# Patient Record
Sex: Female | Born: 1965
Health system: Southern US, Community
[De-identification: ages and names within clinical notes are randomized; demographics above are authoritative.]

## PROBLEM LIST (undated history)

## (undated) DIAGNOSIS — M1990 Unspecified osteoarthritis, unspecified site: Secondary | ICD-10-CM

## (undated) DIAGNOSIS — F323 Major depressive disorder, single episode, severe with psychotic features: Secondary | ICD-10-CM

## (undated) DIAGNOSIS — T7840XA Allergy, unspecified, initial encounter: Secondary | ICD-10-CM

## (undated) DIAGNOSIS — M810 Age-related osteoporosis without current pathological fracture: Secondary | ICD-10-CM

## (undated) DIAGNOSIS — J449 Chronic obstructive pulmonary disease, unspecified: Secondary | ICD-10-CM

## (undated) DIAGNOSIS — Z860100 Personal history of colon polyps, unspecified: Secondary | ICD-10-CM

## (undated) DIAGNOSIS — N200 Calculus of kidney: Secondary | ICD-10-CM

## (undated) DIAGNOSIS — M779 Enthesopathy, unspecified: Secondary | ICD-10-CM

## (undated) DIAGNOSIS — Z9889 Other specified postprocedural states: Secondary | ICD-10-CM

## (undated) DIAGNOSIS — T8859XA Other complications of anesthesia, initial encounter: Secondary | ICD-10-CM

## (undated) DIAGNOSIS — E785 Hyperlipidemia, unspecified: Secondary | ICD-10-CM

## (undated) DIAGNOSIS — I209 Angina pectoris, unspecified: Secondary | ICD-10-CM

## (undated) DIAGNOSIS — K859 Acute pancreatitis without necrosis or infection, unspecified: Secondary | ICD-10-CM

## (undated) DIAGNOSIS — K219 Gastro-esophageal reflux disease without esophagitis: Secondary | ICD-10-CM

## (undated) DIAGNOSIS — J309 Allergic rhinitis, unspecified: Secondary | ICD-10-CM

## (undated) DIAGNOSIS — B029 Zoster without complications: Secondary | ICD-10-CM

## (undated) DIAGNOSIS — J189 Pneumonia, unspecified organism: Secondary | ICD-10-CM

## (undated) DIAGNOSIS — I10 Essential (primary) hypertension: Secondary | ICD-10-CM

## (undated) DIAGNOSIS — R112 Nausea with vomiting, unspecified: Secondary | ICD-10-CM

## (undated) DIAGNOSIS — Z5189 Encounter for other specified aftercare: Secondary | ICD-10-CM

## (undated) DIAGNOSIS — Z87442 Personal history of urinary calculi: Secondary | ICD-10-CM

## (undated) DIAGNOSIS — E119 Type 2 diabetes mellitus without complications: Secondary | ICD-10-CM

## (undated) HISTORY — DX: Calculus of kidney: N20.0

## (undated) HISTORY — DX: Chronic obstructive pulmonary disease, unspecified: J44.9

## (undated) HISTORY — DX: Age-related osteoporosis without current pathological fracture: M81.0

## (undated) HISTORY — PX: FRACTURE SURGERY: SHX138

## (undated) HISTORY — PX: GALLBLADDER SURGERY: SHX652

## (undated) HISTORY — DX: Pneumonia, unspecified organism: J18.9

## (undated) HISTORY — DX: Hyperlipidemia, unspecified: E78.5

## (undated) HISTORY — DX: Type 2 diabetes mellitus without complications: E11.9

## (undated) HISTORY — DX: Unspecified osteoarthritis, unspecified site: M19.90

## (undated) HISTORY — DX: Personal history of colonic polyps: Z86.010

## (undated) HISTORY — DX: Gastro-esophageal reflux disease without esophagitis: K21.9

## (undated) HISTORY — DX: Allergic rhinitis, unspecified: J30.9

## (undated) HISTORY — DX: Personal history of colon polyps, unspecified: Z86.0100

## (undated) HISTORY — DX: Allergy, unspecified, initial encounter: T78.40XA

## (undated) HISTORY — PX: CHOLECYSTECTOMY: SHX55

## (undated) HISTORY — PX: ABDOMINAL HYSTERECTOMY: SHX81

## (undated) HISTORY — DX: Essential (primary) hypertension: I10

## (undated) HISTORY — DX: Encounter for other specified aftercare: Z51.89

## (undated) HISTORY — DX: Enthesopathy, unspecified: M77.9

## (undated) HISTORY — PX: TUBAL LIGATION: SHX77

## (undated) HISTORY — DX: Major depressive disorder, single episode, severe with psychotic features: F32.3

## (undated) HISTORY — DX: Zoster without complications: B02.9

## (undated) HISTORY — DX: Acute pancreatitis without necrosis or infection, unspecified: K85.90

## (undated) HISTORY — PX: JOINT REPLACEMENT: SHX530

---

## 1997-11-25 ENCOUNTER — Inpatient Hospital Stay (HOSPITAL_COMMUNITY): Admission: AD | Admit: 1997-11-25 | Discharge: 1997-11-28 | Payer: Self-pay | Admitting: Obstetrics and Gynecology

## 1997-12-01 ENCOUNTER — Inpatient Hospital Stay (HOSPITAL_COMMUNITY): Admission: AD | Admit: 1997-12-01 | Discharge: 1997-12-01 | Payer: Self-pay | Admitting: *Deleted

## 1997-12-04 ENCOUNTER — Ambulatory Visit (HOSPITAL_COMMUNITY): Admission: RE | Admit: 1997-12-04 | Discharge: 1997-12-04 | Payer: Self-pay | Admitting: Obstetrics and Gynecology

## 1997-12-05 ENCOUNTER — Observation Stay (HOSPITAL_COMMUNITY): Admission: AD | Admit: 1997-12-05 | Discharge: 1997-12-06 | Payer: Self-pay | Admitting: Obstetrics and Gynecology

## 1998-06-03 ENCOUNTER — Other Ambulatory Visit: Admission: RE | Admit: 1998-06-03 | Discharge: 1998-06-03 | Payer: Self-pay | Admitting: Gynecology

## 1999-06-19 ENCOUNTER — Other Ambulatory Visit: Admission: RE | Admit: 1999-06-19 | Discharge: 1999-06-19 | Payer: Self-pay | Admitting: Obstetrics and Gynecology

## 2000-07-20 ENCOUNTER — Other Ambulatory Visit: Admission: RE | Admit: 2000-07-20 | Discharge: 2000-07-20 | Payer: Self-pay | Admitting: Obstetrics and Gynecology

## 2000-10-13 ENCOUNTER — Other Ambulatory Visit: Admission: RE | Admit: 2000-10-13 | Discharge: 2000-10-13 | Payer: Self-pay | Admitting: Obstetrics and Gynecology

## 2001-03-10 ENCOUNTER — Other Ambulatory Visit: Admission: RE | Admit: 2001-03-10 | Discharge: 2001-03-10 | Payer: Self-pay | Admitting: Obstetrics and Gynecology

## 2001-09-22 ENCOUNTER — Other Ambulatory Visit: Admission: RE | Admit: 2001-09-22 | Discharge: 2001-09-22 | Payer: Self-pay | Admitting: Obstetrics and Gynecology

## 2002-03-29 ENCOUNTER — Encounter (INDEPENDENT_AMBULATORY_CARE_PROVIDER_SITE_OTHER): Payer: Self-pay

## 2002-03-29 ENCOUNTER — Ambulatory Visit (HOSPITAL_COMMUNITY): Admission: RE | Admit: 2002-03-29 | Discharge: 2002-03-29 | Payer: Self-pay | Admitting: Obstetrics and Gynecology

## 2002-05-24 ENCOUNTER — Encounter (INDEPENDENT_AMBULATORY_CARE_PROVIDER_SITE_OTHER): Payer: Self-pay

## 2002-05-24 ENCOUNTER — Inpatient Hospital Stay (HOSPITAL_COMMUNITY): Admission: RE | Admit: 2002-05-24 | Discharge: 2002-05-26 | Payer: Self-pay | Admitting: Obstetrics and Gynecology

## 2002-06-14 ENCOUNTER — Inpatient Hospital Stay (HOSPITAL_COMMUNITY): Admission: EM | Admit: 2002-06-14 | Discharge: 2002-06-16 | Payer: Self-pay | Admitting: Psychiatry

## 2005-10-01 ENCOUNTER — Other Ambulatory Visit: Admission: RE | Admit: 2005-10-01 | Discharge: 2005-10-01 | Payer: Self-pay | Admitting: Obstetrics and Gynecology

## 2006-05-21 ENCOUNTER — Ambulatory Visit (HOSPITAL_COMMUNITY): Admission: RE | Admit: 2006-05-21 | Discharge: 2006-05-21 | Payer: Self-pay | Admitting: Specialist

## 2006-05-21 ENCOUNTER — Encounter (INDEPENDENT_AMBULATORY_CARE_PROVIDER_SITE_OTHER): Payer: Self-pay | Admitting: *Deleted

## 2006-09-16 ENCOUNTER — Ambulatory Visit (HOSPITAL_BASED_OUTPATIENT_CLINIC_OR_DEPARTMENT_OTHER): Admission: RE | Admit: 2006-09-16 | Discharge: 2006-09-16 | Payer: Self-pay | Admitting: Urology

## 2006-09-20 HISTORY — PX: CARDIAC CATHETERIZATION: SHX172

## 2006-10-07 ENCOUNTER — Ambulatory Visit (HOSPITAL_BASED_OUTPATIENT_CLINIC_OR_DEPARTMENT_OTHER): Admission: RE | Admit: 2006-10-07 | Discharge: 2006-10-07 | Payer: Self-pay | Admitting: Urology

## 2007-04-04 ENCOUNTER — Ambulatory Visit (HOSPITAL_BASED_OUTPATIENT_CLINIC_OR_DEPARTMENT_OTHER): Admission: RE | Admit: 2007-04-04 | Discharge: 2007-04-04 | Payer: Self-pay | Admitting: Urology

## 2007-05-04 ENCOUNTER — Ambulatory Visit: Payer: Self-pay | Admitting: Endocrinology

## 2007-07-13 ENCOUNTER — Ambulatory Visit (HOSPITAL_BASED_OUTPATIENT_CLINIC_OR_DEPARTMENT_OTHER): Admission: RE | Admit: 2007-07-13 | Discharge: 2007-07-13 | Payer: Self-pay | Admitting: Urology

## 2008-09-10 ENCOUNTER — Ambulatory Visit (HOSPITAL_BASED_OUTPATIENT_CLINIC_OR_DEPARTMENT_OTHER): Admission: RE | Admit: 2008-09-10 | Discharge: 2008-09-10 | Payer: Self-pay | Admitting: Urology

## 2009-08-01 ENCOUNTER — Ambulatory Visit (HOSPITAL_COMMUNITY): Admission: RE | Admit: 2009-08-01 | Discharge: 2009-08-02 | Payer: Self-pay | Admitting: Obstetrics and Gynecology

## 2009-08-01 ENCOUNTER — Encounter (INDEPENDENT_AMBULATORY_CARE_PROVIDER_SITE_OTHER): Payer: Self-pay | Admitting: Obstetrics and Gynecology

## 2009-12-17 ENCOUNTER — Ambulatory Visit (HOSPITAL_COMMUNITY): Admission: RE | Admit: 2009-12-17 | Discharge: 2009-12-17 | Payer: Self-pay | Admitting: Rheumatology

## 2010-03-06 ENCOUNTER — Encounter: Admission: RE | Admit: 2010-03-06 | Discharge: 2010-03-06 | Payer: Self-pay | Admitting: Obstetrics and Gynecology

## 2010-10-11 ENCOUNTER — Encounter: Payer: Self-pay | Admitting: Rheumatology

## 2010-12-23 LAB — URINALYSIS, ROUTINE W REFLEX MICROSCOPIC
Nitrite: NEGATIVE
Specific Gravity, Urine: <1.005 — ABNORMAL LOW (ref 1.005–1.030)
Urobilinogen, UA: 0.2 mg/dL (ref 0.0–1.0)
pH: 5.5 (ref 5.0–8.0)

## 2010-12-23 LAB — CBC
HCT: 43.3 % (ref 36.0–46.0)
MCHC: 33.5 g/dL (ref 30.0–36.0)
MCV: 92.2 fL (ref 78.0–100.0)
Platelets: 236 10*3/uL (ref 150–400)
Platelets: 253 10*3/uL (ref 150–400)
RDW: 12 % (ref 11.5–15.5)
RDW: 12.2 % (ref 11.5–15.5)
WBC: 9.8 10*3/uL (ref 4.0–10.5)

## 2010-12-23 LAB — BASIC METABOLIC PANEL
GFR calc Af Amer: >60 mL/min (ref >60)
GFR calc non Af Amer: >60 mL/min (ref >60)
Potassium: 3.3 mEq/L — ABNORMAL LOW (ref 3.5–5.1)
Sodium: 136 mEq/L (ref 135–145)

## 2010-12-23 LAB — HEMOGLOBIN AND HEMATOCRIT, BLOOD: HCT: 40.3 % (ref 36.0–46.0)

## 2011-01-29 ENCOUNTER — Other Ambulatory Visit: Payer: Self-pay | Admitting: Obstetrics and Gynecology

## 2011-02-02 NOTE — Op Note (Signed)
[name], ORBACH               ACCOUNT NO.:  [id number]   MEDICAL RECORD NO.:  [id number]          PATIENT TYPE:  AMB   LOCATION:  NESC                         FACILITY:  [facility]   PHYSICIAN:  Martina Sinner, MD DATE OF BIRTH:  March 28, 1966   DATE OF PROCEDURE:  07/13/2007  DATE OF DISCHARGE:                               OPERATIVE REPORT   PREOPERATIVE DIAGNOSIS:  Stress incontinence; incontinence without  feeling.   POSTOPERATIVE DIAGNOSIS:  Stress incontinence; incontinence without  sealing.   SURGERY:  Cystoscopy, double balloon retrograde urethrogram and collagen  injection therapy.   Ms. Jeancharles has had two slings.  She is leaking without warning.  Clinically, she did not have urethral diverticulum, but I thought it was  very reasonable under the circumstances to do a balloon urethrogram.   The patient was prepped and draped in usual fashion.  She is given  preoperative antibiotics.   I used a 16-French catheter.  I used two double stranded zero silk to  tie off the end proximal to the opening in the catheter.  I then cut an  opening in the catheter proximal to the balloon.  I gently inserted the  well lubricated catheter and did a retrograde while I pinched off her  urethral meatus.  A little bit of dye reached the bladder.  I could see  the short urethra.  In my opinion, under fluoroscopic guidance, there  was no urethral diverticulum.   RETROGRADE URETHROGRAM:  I performed a retrograde urethrogram with the  technique above.  She was in the AP position.  Under fluoroscopic  guidance I did the x-ray twice.  There was a good visualization of the  urethra.  There is no diverticulum.  Dye reached the bladder.   The Foley catheter was removed.   COLLAGEN INJECTION THERAPY:  I then performed a transurethral injection  of collagen.  I initially injected at the 5 and 7 o'clock.  I was a  little bit disappointed with the degree of coaptation and this may be  due to the  fact that she had two slings.  I then injected at two o'clock  and then more distally at 7 o'clock with excellent coaptation.  Overall,  her procedure efficacy could be effected by the multiple sticks with the  needle, but hopefully not.   Bladder was emptied.  The patient was taken to the recovery room.  The  patient will be followed as per protocol.           ______________________________  Martina Sinner, MD  Electronically Signed     SAM/MEDQ  D:  07/13/2007  T:  07/14/2007  Job:  [id number]

## 2011-02-02 NOTE — Op Note (Signed)
[name], MAHAJAN               ACCOUNT NO.:  [id number]   MEDICAL RECORD NO.:  [id number]          PATIENT TYPE:  AMB   LOCATION:  N[facility]C                         FACILITY:  [facility]   PHYSICIAN:  Martina Sinner, MD DATE OF BIRTH:  12-Mar-1966   DATE OF PROCEDURE:  04/04/2007  DATE OF DISCHARGE:                               OPERATIVE REPORT   PREOPERATIVE DIAGNOSIS:  Stress incontinence.   POSTOPERATIVE DIAGNOSIS:  Stress incontinence.   SURGERY:  Cystoscopy and transurethral collagen injection therapy.   Ms. Lavergne has small volume leakage without warning when she is active.  I have been trying to sort out whether or not it is due to instability  that she does not perceive or to a mild outlet abnormality.  She has had  two sling procedures.   The patient is prepped and draped in the usual fashion.  The ACMI scope  was used for the examination and treatment.  Bladder mucosa and trigone  were normal.  There was no stitch, foreign, body or carcinoma.  I  injected 2.2 and 2/3 syringes of collagen at 5, and 7 and 6 o'clock with  excellent coaptation.  The first injection was a little bit deep but  overall I was very pleased with the procedure and there was no  extravasation.  I hope this will greatly improve her leakage without  warning.  Bladder was partially emptied and the patient taken to  recovery room.           ______________________________  Martina Sinner, MD  Electronically Signed     SAM/MEDQ  D:  04/04/2007  T:  04/05/2007  Job:  [id number]

## 2011-02-02 NOTE — Consult Note (Signed)
[name]                          ENDOCRINOLOGY CONSULTATION   NAME:Rich, Dana LEISING                      MRN:          [id number]  DATE:05/04/2007                            DOB:          01/06/1966    REFERRING PHYSICIAN:  Dr. Lorin Picket in [place].   REASON FOR VISIT:  Thyroid eye disease.   HISTORY OF PRESENT ILLNESS:  A 45 year old woman who states 4 years of  left eye bulging.  Her eye doctor told her that this was due to her  thyroid.  She is feeling well except for some weight gain.   PAST MEDICAL HISTORY:  Allergic rhinitis.   SOCIAL HISTORY:  She is married and works in Materials engineer.   FAMILY HISTORY:  She has several second degree relatives who have  hypothyroidism.   REVIEW OF SYSTEMS:  She denies double vision.   PHYSICAL EXAMINATION:  VITAL SIGNS:  Blood pressure 117/72, heart rate  86, temperature 97.4, weight 183.  GENERAL:  No distress.  SKIN:  Not diaphoretic.  HEENT:  She has minimal left proptosis.  Thyroid is normal.   LABORATORY DATA:  On January 03, 2007, TSH 1.17.  On September 03, 2004,  TSH 1.31.   IMPRESSION:  1. Minimal thyroid eye disease.  2. Weight gain of nonthyroidal etiology.   PLAN:  1. I offered to check an MRI of the eye to further define the eye      disease, but I told her it really does not change therapy, and she      has declined this.  2. Continue efforts toward weight loss.  3. I have told her that she should see Dr. Lorin Picket annually and she      should have an annual TSH and physical examination of the thyroid      and I am happy to see her back as needed.    Sean A. Everardo All, MD  Electronically Signed   SAE/MedQ  DD: 05/07/2007  DT: 05/08/2007  Job #: [id number]   cc:   Dr. Lorin Picket, Rosalita Levan

## 2011-02-02 NOTE — Op Note (Signed)
[name], [name]               ACCOUNT NO.:  [id number]   MEDICAL RECORD NO.:  [id number]          PATIENT TYPE:  AMB   LOCATION:  N[facility]                         FACILITY:  [facility]   PHYSICIAN:  Martina Sinner, MD DATE OF BIRTH:  16-Apr-1966   DATE OF PROCEDURE:  09/10/2008  DATE OF DISCHARGE:  09/10/2008                               OPERATIVE REPORT   PREOPERATIVE DIAGNOSIS:  Stress urinary incontinence.   POSTOPERATIVE DIAGNOSIS:  Stress urinary incontinence.   SURGERY:  Cystoscopy, transurethral collagen injection therapy.   [name] has stress incontinence following two slings.  She has had  some response  to collagen in the past.   The patient was prepped and draped in the usual fashion.  The ACMI scope  was utilized.  The bladder mucosa and trigone were normal.  There was no  stitch, foreign body or carcinoma.  I injected 3 syringes of collagen,  one at 5 and two at 7 o'clock.  There was excellent coaptation.  I was  very pleased with the procedure.  The bladder was emptied with a small  red rubber catheter and the patient was taken to the recovery room.           ______________________________  Martina Sinner, MD  Electronically Signed     SAM/MEDQ  D:  09/19/2008  T:  09/19/2008  Job:  [id number]

## 2011-02-05 NOTE — Discharge Summary (Signed)
NAME:  [name], [name]                         ACCOUNT NO.:  [id number]   MEDICAL RECORD NO.:  [id number]                   PATIENT TYPE:  IPS   LOCATION:  0400                                 FACILITY:  [facility]   PHYSICIAN:  Jeanice Lim, M.D.              DATE OF BIRTH:  [birth year]   DATE OF ADMISSION:  06/14/2002  DATE OF DISCHARGE:  06/16/2002                                 DISCHARGE SUMMARY   IDENTIFYING DATA:  This is a 45 year old Caucasian female, married,  voluntarily admitted reporting being harassed by spirits and felt that she  was not herself, appearing delusional.   MEDICATIONS:  Prozac.   ALLERGIES:  No known drug allergies.   PHYSICAL EXAMINATION:  Essentially within normal limits.  Neurologically  nonfocal.  Too restless and disorganized in the ER to have a complete  physical examination.  CT was negative.  The patient was medically cleared.   LABORATORY DATA:  CBC and CMET were within normal limits.   MENTAL STATUS EXAM:  Medium-built, healthy, fully alert, calm female who had  had 2 mg of Ativan and was more calm.  Speech was somewhat disorganized,  hyperreligious.  Mood somewhat labile.  Thought processes goal directed.  Thought content preoccupied with God and spirits, delusional and  hyperreligious with flight of ideas.  Cognitively intact.  Judgment and  insight poor.   ADMISSION DIAGNOSES:   AXIS I:  Bipolar disorder, manic-phase.   AXIS II:  None.   AXIS III:  1. Status post hysterectomy.  2. Seasonal rhinitis.   AXIS IV:  Moderate (problems with primary support group).   AXIS V:  22/78.   HOSPITAL COURSE:  The patient was admitted and ordered routine p.r.n.  medications and underwent further monitoring and was encouraged to  participate in individual, group and milieu therapy.  The patient was  resumed on psychotropics and given Zyprexa Zydis and Ativan p.r.n.  agitation.  Zyrtec was ordered as well as Risperdal and Cogentin for  dystonia.  The patient was then placed on Prozac, Risperdal and family was  contacted.  Aftercare was discussed.  The patient reported a positive  response to clinical intervention.   CONDITION ON DISCHARGE:  Improved.  Mood was more euthymic.  Affect  brighter.  Thought processes goal directed.  Thought content negative for  dangerous ideation or psychotic symptoms.  The patient reported motivation  to be compliant with the follow-up plan.   DISCHARGE MEDICATIONS:  1. Prozac 10 mg q.a.m.  2. Risperdal 0.5 mg q.h.s.   FOLLOW UP:  [facility] on Monday,  June 18, 2002 for intensive close follow-up.   DISCHARGE DIAGNOSES:   AXIS I:  Bipolar disorder, manic-phase.   AXIS II:  None.   AXIS III:  1. Status post hysterectomy.  2. Seasonal rhinitis.   AXIS IV:  Moderate (problems with primary support group).   AXIS V:  Global Assessment of Functioning on discharge 50.                                                 Jeanice Lim, M.D.    JEM/MEDQ  D:  07/15/2002  T:  07/15/2002  Job:  [id number]

## 2011-02-05 NOTE — Op Note (Signed)
[name], [name]               ACCOUNT NO.:  [id number]   MEDICAL RECORD NO.:  [id number]          PATIENT TYPE:  AMB   LOCATION:  [facility]C                         FACILITY:  [facility]   PHYSICIAN:  Mark C. Vernie Ammons, M.D.  DATE OF BIRTH:  07-Dec-1965   DATE OF PROCEDURE:  09/16/2006  DATE OF DISCHARGE:                               OPERATIVE REPORT   PREOPERATIVE DIAGNOSIS:  Stress urinary incontinence.   POSTOPERATIVE DIAGNOSIS:  Stress urinary incontinence.   PROCEDURE:  Transobturator sling.   SURGEON:  Dr. Vernie Ammons   ANESTHESIA:  General.   BLOOD LOSS:  Minimal.   DRAINS:  None.   SPECIMENS:  None.   COMPLICATIONS:  None.   INDICATIONS:  The patient is a 45 year old white female, who was seen  initially for a chief complaint of leakage of urine for 9 years after  having her second child.  It requires she wears a pad and also was found  to have some mild bladder instability.  We discussed surgical correction  of the stress component with the associated risks, complications, and  alternatives.  She understands and elects to proceed with surgical  correction at this time.   DESCRIPTION OF OPERATION:  After informed consent, the patient brought  to the major OR, placed on table, administered general anesthesia, then  moved to the dorsal lithotomy position.  Her genitalia was sterilely  prepped and draped as well as the vagina.  A weighted speculum was  placed in the vagina and a Foley catheter placed in the bladder, and the  bladder was fully drained.   Half-percent Marcaine with 1:200,000 epinephrine was injected  suburethrally just beneath the vaginal mucosa.  I then allowed adequate  time for epinephrine effect and made an incision in the midline over the  mid urethra by palpation of the bladder neck with mild traction on the  Foley catheter.  Sharp dissection was then undertaken on either side of  the urethra, just beneath the vaginal mucosa.  This exposed the mid  urethral region.  I was then able to insert an index finger through the  incision and palpate the undersurface of the symphysis pubis on each  side.   Stab incisions were then made 5 cm lateral to the midline at the level  of the clitoris at the location of the obturator fossa.  The bladder was  fully drained through the Foley catheter, and the transobturator trocar  was then passed through the skin incision, through the obturator fossa  and back behind the symphysis pubis and then directed out on my index  finger at the mid urethral level with care being taken not to perforate  the vaginal mucosa throughout the length of the trocar's course.  The  sling material was then affixed to the tip of the trocar and brought  back through and out through the skin incision.  This was performed  first on the left side and then right side in an identical fashion.   The Foley catheter was then removed, and the 21-French cystoscope with  70-degree lens was inserted in the bladder.  The bladder was fully  inspect and noted to be free of any tumor, stones, or inflammatory  lesions.  There was no evidence of perforation of the bladder, foreign  body, bleeding, or other abnormality.  The cystoscope was then removed  and Foley catheter replaced.  I then adjusted the sling tension by  placing forceps beneath the urethra, removing the plastic coating from  the sling first on left side and then adjusting tension on the right.  I  noted the sling laid in good position at the mid urethral level with  minimal tension.  The incision was then irrigated copiously with  antibiotic solution, and the vaginal mucosa was closed with running 2-0  Vicryl suture.  The skin incisions were then closed with Dermabond and  the Foley catheter removed, and the patient was awakened and taken to  recovery room in stable satisfactory condition.  She tolerated the  procedure well with no intraoperative complications.  Needle,  sponge,  and instrument counts were correct at the end of the operation.   She will be given a prescription for Vicoprofen 1-2 q.4 h p.r.n. #28 and  Cipro 500 mg b.i.d. #10.  She will follow-up my office in 1 week for  recheck and was given written instructions upon discharge.      Mark C. Vernie Ammons, M.D.  Electronically Signed     MCO/MEDQ  D:  09/16/2006  T:  09/16/2006  Job:  [id number]

## 2011-02-05 NOTE — Discharge Summary (Signed)
[name], [name]                         ACCOUNT NO.:  [id number]   MEDICAL RECORD NO.:  [id number]                   PATIENT TYPE:  INP   LOCATION:  9321                                 FACILITY:  [facility]   PHYSICIAN:  Miguel Aschoff, M.D.                    DATE OF BIRTH:  1966/01/03   DATE OF ADMISSION:  05/24/2002  DATE OF DISCHARGE:  05/26/2002                                 DISCHARGE SUMMARY   ADMISSION DIAGNOSES:  1. Menorrhagia.  2. Dysmenorrhea.  3. Cystocele.  4. Rectocele.   FINAL DIAGNOSES:  1. Menorrhagia.  2. Dysmenorrhea.  3. Cystocele.  4. Rectocele.   OPERATIONS AND PROCEDURES:  1. Total vaginal hysterectomy.  2. Anterior and posterior colporrhaphy.   ANESTHESIA:  General.   BRIEF HISTORY:  The patient is a 45 year old white female with history of  progressive menorrhagia and dysmenorrhea status post prior D&C and  laparoscopy.  The patient's heavy vaginal bleeding has continued as well as  her dysmenorrhea.  In addition, on clinical examination she was noted to  have first degree rectocele and second degree cystocele.  In view of the  menorrhagia, dysmenorrhea, cystocele, and rectocele she was admitted to the  hospital to undergo definitive therapy via total vaginal hysterectomy and  anterior and posterior colporrhaphy.   HOSPITAL COURSE:  Preoperative laboratories were obtained.  Her admission  hemoglobin was 14.4, white count 5700.  Chemistry profile was within normal  limits.  Urinalysis was negative.  Under general anesthesia on May 24, 2002 a total vaginal hysterectomy and anterior and posterior colporrhaphy  were carried out without difficulty.  The patient tolerated the procedure  well.  She had essentially uncomplicated postoperative course, tolerating  diet, ambulation, and any medications without problem.  By September 6 she  was in satisfactory condition and felt stable enough to be sent home.  She  did spike a temperature of 100.9 on  the night prior to discharge.  She was  sent home on September 6 in satisfactory condition.   DISCHARGE MEDICATIONS:  1. Cipro 250 mg p.o. b.i.d. for five days.  2. Tylox one or two q.3h. as needed for pain.   DISCHARGE INSTRUCTIONS:  She was instructed to do no heavy lifting, place  nothing in the vagina, to call if there are any problems such as fever,  pain, or heavy bleeding.  The patient is to be seen back in the office in  four  weeks for follow-up examination.  The pathology report on the hysterectomy  specimen revealed 111 g uterus with benign secretory endometrium.  Endometrium and serosa were unremarkable.  The patient was sent home in  satisfactory condition.  Miguel Aschoff, M.D.    AR/MEDQ  D:  07/06/2002  T:  07/06/2002  Job:  [id number]

## 2011-02-05 NOTE — Op Note (Signed)
[name], [name]                         ACCOUNT NO.:  [id number]   MEDICAL RECORD NO.:  [id number]                   PATIENT TYPE:  INP   LOCATION:  9321                                 FACILITY:  [facility]   PHYSICIAN:  Miguel Aschoff, M.D.                    DATE OF BIRTH:  14-Jul-1966   DATE OF PROCEDURE:  05/22/2002  DATE OF DISCHARGE:                                 OPERATIVE REPORT   PREOPERATIVE DIAGNOSES:  Menorrhagia, dysmenorrhea, pelvic pain, hypermobile  urethra with small cystocele and rectocele.   POSTOPERATIVE DIAGNOSES:  Menorrhagia, dysmenorrhea, pelvic pain,  hypermobile urethra with small cystocele and rectocele.   PROCEDURE:  Total vaginal hysterectomy, anterior and posterior colporrhaphy.   SURGEON:  Miguel Aschoff, M.D.   ASSISTANT:  Randye Lobo, M.D.   ANESTHESIA:  General.   COMPLICATIONS:  None.   JUSTIFICATION:  The patient is a 45 year old white female gravida 2, para 2-  0-0-2 who has had a history of progressively heavy menses and associated  dysmenorrhea and persistent pelvic pain, especially on the left side.  The  patient had undergone laparoscopy and D&C in July 2003 in an effort to  control the pain and the bleeding.  However, the patient's symptoms  persisted.  In view of the persistence of her symptoms, she wanted a  definitive procedure carried out and presents now to undergo total vaginal  hysterectomy.  She has some urinary symptoms and mild leakage of urine and  associated hypermobile urethra is noted as well as rectocele.  These defects  are going to be corrected at time of this surgery.  Informed consent has  been obtained.   PROCEDURE:  The patient was taken to the operating room, placed in a supine  position and general anesthesia was administered without difficulty.  She  was then prepped and draped in the usual sterile fashion in the dorsal  lithotomy position.  The bladder was catheterized.  Once this was done a  speculum was  placed in the vaginal vault.  The anterior cervical lip was  grasped with a tenaculum.  There was +2 descensus noted.  The uterus  appeared to be normal size and shape.  Adnexa revealed no masses.  The  cervical mucosa was then injected with 1% Xylocaine with epinephrine to be  used for hemostasis then the cervical mucosa was circumscribed and  dissection was carried out anteriorly and posteriorly until the peritoneal  reflections could be found.  Posteriorly, the cul-de-sac was entered.  At  this point the uterosacral ligaments were identified, clamped with curved  Heaney clamps.  Pedicles were cut and suture ligated using suture ligatures  of 0 Vicryl.  These were then tagged with Hemostats.  Then the cardinal  ligaments were clamped, cut, and suture ligated in a similar fashion using  curved Heaney clamps.  Then the uterine vessels were identified, clamped  with  curved Heaney clamps.  All pedicles cut and suture ligated using suture  ligatures of 0 Vicryl.  Additional bites were taken of the parametrial  tissue using curved Heaney clamps.  All pedicles again were suture ligated  using suture ligatures of 0 Vicryl.  The anterior peritoneum was entered  also without difficulty.  At this point it was possible to deliver the  fundus of the uterus through the cul-de-sac and the additional structures of  the broad ligament, utero-ovarian ligament, round ligament, fallopian tube  were identified, clamped with curved Heaney clamps, and then these pedicles  were cut and doubly ligated, first with suture ligatures of 0 Vicryl and  then with free ties of 0 Vicryl.  These were then tagged and held.  At this  point the posterior vaginal cuff was run using running interlocking 0 Vicryl  suture.  The cul-de-sac was somewhat reduced using interrupted sutures of 0  Vicryl.  At this point all pedicles were inspected and found to be  hemostatically secure.  The ovaries were allowed to return to their  normal  anatomic position and at this point the peritoneum was closed using a purse-  string suture of 0 Vicryl.  After closure of the peritoneum a Foley catheter  was inserted.  Clear urine was obtained.  Then the anterior vaginal wall was  injected with 1% Xylocaine with epinephrine and the vaginal mucosa was  dissected in the midline until approximately 2 cm over the urethral orifice.  After opening the vaginal mucosa, the paravesical and periurethral tissues  were separated from the vaginal mucosa using sharp dissection.  The patient  again was noted to have a hypermobile urethra and using two sutures of 0  Vicryl in a Kelly plication type suture it was possible to elevate the  urethra to a semi-anatomic configuration.  At this point the cystocele was  reduced using purse-string sutures of 2-0 Vicryl and the paravesical tissues  were used to support the repair using interrupted 0 Vicryl suture.  The  excess vaginal mucosa was then trimmed and the anterior vaginal wall was  reapproximated using running interlocking 2-0 Vicryl suture with care to  close the dead space during the application of these sutures.  The vaginal  cuff was then closed using interrupted 0 Vicryl sutures.  Once this was done  with good hemostasis in this area, attention was directed to the perineum.  The posterior vaginal wall was then injected with 1% Xylocaine with  epinephrine.  An ellipse of mucosa was then cut in the midline and then  dissection was carried out in the midline separating the vaginal mucosa from  the underlying perirectal tissues and the rectocele.  Then using sharp  dissection the rectocele was freed off the mucosa and then using interrupted  0 Vicryl sutures the rectocele was reduced.  The excess vaginal mucosa was  trimmed and then the posterior wall was reconstructed using running  interlocking 0 Vicryl sutures and the dead space was closed in this closure. Perineal body was then  reconstructed by placing 0 Vicryl sutures in the  peritoneal muscles and approximating them in the midline.  The levator  muscles were then also approximated in the midline prior to closure of the  perineal muscles.  Then the mucosa was reapproximated using running  interlocking 2-0 Vicryl suture and the peritoneal skin approximated using  subcuticular 2-0 Vicryl suture.  An iodoform pack was placed.  The estimated  blood loss was approximately 100 cc.  The patient tolerated the procedure  well and went to the recovery room in satisfactory condition.  Clear urine  was obtained at the end of the procedure.                                               Miguel Aschoff, M.D.    AR/MEDQ  D:  05/24/2002  T:  05/24/2002  Job:  [id number]

## 2011-02-05 NOTE — Op Note (Signed)
[facility]  Patient:    [name], [name] Visit Number: [id number] MRN: [id number]          Service Type: DSU Location: [facility] Attending Physician:  Miguel Aschoff Dictated by:   Miguel Aschoff, M.D. Proc. Date: 03/29/02 Admit Date:  03/29/2002 Discharge Date: 03/29/2002                             Operative Report  PREOPERATIVE DIAGNOSES:       1. Chronic pelvic pain.                               2. Irregular vaginal bleeding.  POSTOPERATIVE DIAGNOSES:      1. Chronic pelvic pain.                               2. Irregular vaginal bleeding.                               3. Pelvic adhesions.                               4. Endometrial polyps.  OPERATION:                    1. Diagnostic laparoscopy with lysis of                                  adhesions and laser uterosacral nerve                                  ablation.                               2. Hysteroscopy and D&C.  SURGEON:                      Miguel Aschoff, M.D.  ANESTHESIA:                   General.  COMPLICATIONS:                None.  INDICATIONS:                  The patient is a 45 year old white female with a history of persistent chronic pelvic pain that has been unresponsive to medical therapy.  In addition, the patient has had irregular vaginal bleeding that has also not responded to medical therapy.  Because of the irregular vaginal bleeding and chronic pain, and the desire to have the etiology established and corrected, she presents now to undergo hysteroscopy and D&C, and laparoscopy.  The risks and benefits of the procedure were discussed with the patient and informed consent has been obtained.  DESCRIPTION OF PROCEDURE:     The patient was taken to the operating room and placed in the supine position.  General anesthesia was administered without difficulty.  She was then placed in the dorsolithotomy position, prepped and draped in the usual sterile fashion.  The  bladder was catheterized.  At this point, a speculum was placed in the vaginal vault.  The anterior cervical lip was grasped with the tenaculum.  The endocervical canal was dilated using serial Pratt dilators until a #23 Pratt dilator could be passed.  At this point, the diagnostic hysteroscope was advanced through the cervix. There was no endocervical lesions noted.  Upon entering the endometrial cavity, the cavity appeared lush, and there appeared to be several polypoid areas in the endometrium.  It was not certain if these represented true polyps or just very lush in nature.  No other abnormalities were noted.  There were no submucous myomas noted.  At this point, the hysteroscope was removed, and vigorous curettage was carried out using the inside serrated curet, and the tissue was sent for histologic study.  Repeat hysteroscopy following the curettage showed evidence of removal of all the polypoid areas and no other abnormalities were noted.  At this point, this portion of the procedure was completed.  The hysteroscope was removed, and the Hulka tenaculum was placed through the cervix and held.  Once this was done, attention was directed to the umbilicus, where a small infraumbilical incision was made.  A Veress needle was inserted and the abdomen was insufflated with 3 L of CO2.  Following insufflation, the trocar to the laparoscope was placed followed by the laparoscope itself.  To allow complete visualization, a second 5 mm port was established.  Systematic inspection of the pelvic organs showed the anterior bladder peritoneum to be unremarkable.  The uterus was palpable normal size.  Its surface appeared to be somewhat mottled, suggestive of possible adenomyosis.  The round ligaments were unremarkable and no hernias were noted.  The tubes were traced out to the fimbriated ends.  The fimbria bilaterally were normal.  Tubal segments were missing consistent with the patients  prior history of tubal sterilization. The ovaries were inspected and were totally within normal limits.  No adhesions were noted.  Inspection of the cul-de-sac did not reveal any evidence of endometriosis.  The uterosacral ligaments however were prominent. There was a single band of omental adhesions running from the omentum to the anterior abdominal wall.  The intestinal surfaces were otherwise unremarkable. The liver was unremarkable and no other abnormalities were noted.  At this point, the laser was introduced through the operating channel of the laparoscope.  The adhesions on the anterior abdominal wall were taken down using the YAG laser without difficulty.  Then, the uterosacral ligaments were partially transected using the YAG laser with 15 watts of power on the GRP suction tip.  This was done with good hemostasis and with care to avoid any injury to adjacent structures or the ureter, so that the transection would result in improvement of the patients pelvic pain.  At this point, with no other abnormalities being noted, and with good hemostasis, the procedure was completed.  The CO2 was allowed to escape.  All instruments were removed and the small incisions were then closed using subcuticular 4-0 Vicryl.  The estimated blood loss from the procedure was less than 30 cc.  The patient will be sent home.  Medications for home include Tylox one every three hours as needed for pain, Cipro 250 mg b.i.d. x3 days. Dictated by:   Miguel Aschoff, M.D. Attending Physician:  Miguel Aschoff DD:  03/29/02 TD:  04/01/02 Job: [id number]

## 2011-02-05 NOTE — Op Note (Signed)
[name], [name]               ACCOUNT NO.:  [id number]   MEDICAL RECORD NO.:  [id number]          PATIENT TYPE:  AMB   LOCATION:  [facility]                         FACILITY:  [facility]   PHYSICIAN:  Mark C. Vernie Ammons, M.D.  DATE OF BIRTH:  Jan 24, 1966   DATE OF PROCEDURE:  10/07/2006  DATE OF DISCHARGE:                               OPERATIVE REPORT   PREOPERATIVE DIAGNOSIS:  Stress urinary incontinence (persistent).   POSTOPERATIVE DIAGNOSIS:  Stress urinary incontinence (persistent).   PROCEDURE:  Suprapubic sling placement.   SURGEON:  Dr. Vernie Ammons   ANESTHESIA:  General.   DRAINS:  None.   SPECIMENS:  None.   BLOOD LOSS:  Minimal.   COMPLICATIONS:  None.   INDICATIONS:  The patient is a 45 year old white female who 3 weeks ago  underwent a transobturator sling for pure stress urinary continence  documented by urodynamics.  She had some vaginal discharge initially  felt to be some mild postsurgical drainage; however, that persisted.  It  then seemed to worsen slightly and on followup, I had her perform a  Pyridium test, and she noted the staining was urine because of the  Pyridium discoloration.  I evaluated her for a fistula cystoscopically  and found no evidence of urethral injury or bladder injury that could  account for this.  I therefore discussed conservative management with  her; however, she wanted to proceed with surgical correction.  She  therefore is brought to the OR today for placement of a suprapubic  sling, and we discussed risks, complications and alternatives.   DESCRIPTION OF OPERATION:  After informed consent, the patient brought  to major OR, placed on table, administered general anesthesia, then  moved to the dorsal lithotomy position.  Genitalia, lower abdomen, and  vagina were sterilely prepped and draped.  A 16-French Foley catheter  was inserted in the bladder.  The bladder was drained.  I noted the  previous incision appeared intact.  I grasped the  suture and divided  that, and I was able to open the incision.  There was no evidence of  purulence or worrisome drainage of any type.  I then turned my attention  to the suprapubic region.   Superior to the symphysis pubis, approximately 4 fingerbreadths apart,  the skin was anesthetized with lidocaine with epinephrine, and a stab  incision was made on each side.  With the bladder completely drained,  the trocar was then passed through the skin incision, down back behind  the symphysis pubis and directed out at the mid urethral level which was  determined by palpation and gentle traction on the Foley catheter to  determine the bladder neck location.  This was performed first on left  and right sides.  I left the trocars in place and then removed the Foley  catheter.   Cystoscopy was then performed using a 70-degree lens and 22-French  cystoscope.  I note the bladder was entirely normal in appearance with  no evidence of perforation or injury.  There was no bleeding.  Ureteral  orifices normal in configuration and position.  I then withdrew the  scope and carefully inspected the urethra throughout its length noting  no evidence of erosion or sling material visible.  I therefore removed  the cystoscope and reinserted the Foley catheter draining the bladder.  I then affixed the sling material to the trocars and brought this back  up through the suprapubic incisions.  I then positioned this under the  urethra with just a very mild amount of tension, (just slightly greater  than no tension at all).  I then removed the plastic cover from each  side of the sling with the forceps placed beneath the urethra to prevent  over-tightening.  The excess sling material was then excised, and I  checked again noting the sling to be in good position at the mid  urethral level with no undue tension.  I irrigated the wound copiously  with antibiotic solution.  I then closed the skin incisions with   Dermabond.  The vaginal incision was then closed with running locking 2-  0 Vicryl suture, and the vagina was 2 inch Iodoform gauze and double  antibiotic ointment, and the patient was awakened and taken to recovery  room in stable satisfactory condition.  She tolerated the procedure  well.  There are no intraoperative complications.  Her Foley catheter  was removed at the end of the operation, and she will be observed in the  recovery room and discharged home after she voids spontaneously.  She  will be given a prescription for Levaquin 500 mg to be taken daily, #14  and take Vicoprofen, 1-2 q.4 h p.r.n. #36.  Her followup will be in my  office in 1 week.      Mark C. Vernie Ammons, M.D.  Electronically Signed     MCO/MEDQ  D:  10/07/2006  T:  10/07/2006  Job:  [id number]

## 2011-06-25 LAB — POCT HEMOGLOBIN-HEMACUE: Hemoglobin: 15.1 g/dL — ABNORMAL HIGH (ref 12.0–15.0)

## 2011-06-30 LAB — POCT HEMOGLOBIN-HEMACUE: Hemoglobin: 15.2 — ABNORMAL HIGH

## 2011-06-30 LAB — TSH: TSH: 2.947

## 2011-06-30 LAB — T4, FREE: Free T4: 1.21

## 2011-07-05 LAB — POCT HEMOGLOBIN-HEMACUE: Operator id: 268271

## 2011-08-31 DIAGNOSIS — R079 Chest pain, unspecified: Secondary | ICD-10-CM | POA: Insufficient documentation

## 2012-02-16 ENCOUNTER — Other Ambulatory Visit: Payer: Self-pay | Admitting: Obstetrics and Gynecology

## 2012-02-16 DIAGNOSIS — R928 Other abnormal and inconclusive findings on diagnostic imaging of breast: Secondary | ICD-10-CM

## 2012-02-23 ENCOUNTER — Other Ambulatory Visit: Payer: Self-pay | Admitting: Obstetrics and Gynecology

## 2012-02-23 ENCOUNTER — Ambulatory Visit
Admission: RE | Admit: 2012-02-23 | Discharge: 2012-02-23 | Disposition: A | Discharge status: Home / Self Care | Payer: Commercial Managed Care - PPO | Source: Ambulatory Visit | Attending: Obstetrics and Gynecology | Admitting: Obstetrics and Gynecology

## 2012-02-23 DIAGNOSIS — R928 Other abnormal and inconclusive findings on diagnostic imaging of breast: Secondary | ICD-10-CM

## 2012-02-25 ENCOUNTER — Other Ambulatory Visit: Payer: Commercial Managed Care - PPO

## 2012-02-28 ENCOUNTER — Other Ambulatory Visit: Payer: Commercial Managed Care - PPO

## 2012-02-28 ENCOUNTER — Other Ambulatory Visit: Payer: Self-pay | Admitting: Obstetrics and Gynecology

## 2012-02-28 ENCOUNTER — Ambulatory Visit
Admission: RE | Admit: 2012-02-28 | Discharge: 2012-02-28 | Disposition: A | Discharge status: Home / Self Care | Payer: Commercial Managed Care - PPO | Source: Ambulatory Visit | Attending: Obstetrics and Gynecology | Admitting: Obstetrics and Gynecology

## 2012-02-28 DIAGNOSIS — R928 Other abnormal and inconclusive findings on diagnostic imaging of breast: Secondary | ICD-10-CM

## 2012-03-03 ENCOUNTER — Other Ambulatory Visit: Payer: Commercial Managed Care - PPO

## 2013-01-29 DIAGNOSIS — M79604 Pain in right leg: Secondary | ICD-10-CM | POA: Insufficient documentation

## 2013-02-23 DIAGNOSIS — K2980 Duodenitis without bleeding: Secondary | ICD-10-CM | POA: Insufficient documentation

## 2013-03-15 ENCOUNTER — Other Ambulatory Visit: Payer: Self-pay | Admitting: Obstetrics and Gynecology

## 2014-04-05 ENCOUNTER — Other Ambulatory Visit: Payer: Self-pay | Admitting: Obstetrics and Gynecology

## 2014-04-08 LAB — CYTOLOGY - PAP

## 2014-04-24 ENCOUNTER — Other Ambulatory Visit (INDEPENDENT_AMBULATORY_CARE_PROVIDER_SITE_OTHER): Payer: Self-pay

## 2014-04-24 ENCOUNTER — Encounter (INDEPENDENT_AMBULATORY_CARE_PROVIDER_SITE_OTHER): Payer: Self-pay | Admitting: General Surgery

## 2014-04-24 ENCOUNTER — Ambulatory Visit (INDEPENDENT_AMBULATORY_CARE_PROVIDER_SITE_OTHER): Payer: Commercial Managed Care - PPO | Admitting: General Surgery

## 2014-04-24 VITALS — Ht 67.0 in | Wt 171.0 lb

## 2014-04-24 DIAGNOSIS — L02412 Cutaneous abscess of left axilla: Secondary | ICD-10-CM | POA: Insufficient documentation

## 2014-04-24 DIAGNOSIS — L738 Other specified follicular disorders: Secondary | ICD-10-CM

## 2014-04-24 DIAGNOSIS — IMO0002 Reserved for concepts with insufficient information to code with codable children: Secondary | ICD-10-CM

## 2014-04-24 DIAGNOSIS — K429 Umbilical hernia without obstruction or gangrene: Secondary | ICD-10-CM

## 2014-04-24 MED ORDER — TRAMADOL HCL 50 MG PO TABS: 50.0000 mg | ORAL_TABLET | Freq: Four times a day (QID) | ORAL | Status: DC | PRN

## 2014-04-24 MED ORDER — DOXYCYCLINE HYCLATE 100 MG PO TABS: 100.0000 mg | ORAL_TABLET | Freq: Two times a day (BID) | ORAL | Status: DC

## 2014-04-24 NOTE — Patient Instructions (Signed)
You have a bacterial infection in your left axilla. This had formed an abscess. We made an incision in the skin today and drained the abscess and took cultures.  You have been given a prescription for doxycycline, (an antibiotic). Take this antibiotic until it is completely used. You may discontinue the Augmentin  You have also been given a prescription for Ultram for pain.  Remove the packing from the wound tomorrow and do not repack  Take a shower twice a day and then covere the incision with a dry gauze bandage  This will take at least 2 weeks to completely heal  Return to see Dr. Dalbert Batman in 2 weeks to be sure that your making good progress.

## 2014-04-24 NOTE — Progress Notes (Addendum)
Patient ID: [name], female   DOB: Aug 21, 1966, 48 y.o.   MRN: [id number]  Chief Complaint  Patient presents with  . left axilla abscess    HPI [name] is a 48 y.o. female.  She is referred by Dr. Lovette Rich at [facility] in [address] for evaluation and management of a left axillary abscess.  This patient states she's had an episode of axilla abscess 6 months ago which resolved spontaneously. She now presents with a five-day history of progressive pain and swelling and redness in the left axilla. She said this popped and started draining but she was driving appeared today. She was started on Augmentin 48 hours again. Denies fever or chills.  She also states that she was supposed to be referred up here for reevaluation of her umbilicus... she feels a bulge and some minor discomfort. She's had some laparoscopic surgery there before including a laparoscopic cholecystectomy.  Comorbidities include hypertension. Obesity. Hyperlipidemia. Allergic rhinitis. History of psychotic depression 2003. Currently takes alprazolam, topiramate, Norvasc ,  PPI's, statins,   She requests a refill of Ultram HPI  Past Medical History  Diagnosis Date  . Arthritis   . Blood transfusion without reported diagnosis   . GERD (gastroesophageal reflux disease)   . Hyperlipidemia     Past Surgical History  Procedure Laterality Date  . Cesarean section    . Gallbladder surgery    . Tubal ligation    . Abdominal hysterectomy      Family History  Problem Relation Age of Onset  . COPD Father     Social History History  Substance Use Topics  . Smoking status: Current Every Day Smoker    Types: Cigarettes  . Smokeless tobacco: Not on file  . Alcohol Use: Yes    No Known Allergies  Current Outpatient Prescriptions  Medication Sig Dispense Refill  . ALPRAZolam (XANAX) 0.25 MG tablet       . amLODipine (NORVASC) 5 MG tablet       . amoxicillin-clavulanate (AUGMENTIN)  875-125 MG per tablet       . atorvastatin (LIPITOR) 10 MG tablet       . flavoxATE (URISPAS) 100 MG tablet       . RA NICOTINE 21 MG/24HR patch       . topiramate (TOPAMAX) 25 MG tablet       . triamcinolone (NASACORT) 55 MCG/ACT AERO nasal inhaler       . doxycycline (VIBRA-TABS) 100 MG tablet Take 1 tablet (100 mg total) by mouth 2 (two) times daily.  20 tablet  1  . traMADol (ULTRAM) 50 MG tablet Take 1-2 tablets (50-100 mg total) by mouth every 6 (six) hours as needed.  30 tablet  1   No current facility-administered medications for this visit.    Review of Systems Review of Systems  Constitutional: Negative for fever, chills and unexpected weight change.  HENT: Negative for congestion, hearing loss, sore throat, trouble swallowing and voice change.   Eyes: Negative for visual disturbance.  Respiratory: Negative for cough and wheezing.   Cardiovascular: Negative for chest pain, palpitations and leg swelling.  Gastrointestinal: Positive for abdominal pain. Negative for nausea, vomiting, diarrhea, constipation, blood in stool, abdominal distention and anal bleeding.  Genitourinary: Negative for hematuria, vaginal bleeding and difficulty urinating.  Musculoskeletal: Negative for arthralgias.  Skin: Positive for color change and wound. Negative for rash.  Neurological: Negative for seizures, syncope and headaches.  Hematological: Negative for adenopathy. Does not  bruise/bleed easily.  Psychiatric/Behavioral: Positive for agitation. Negative for confusion. The patient is nervous/anxious.     Height 5\' 7"  (1.702 m), weight 171 lb (77.565 kg).  Physical Exam Physical Exam  Constitutional: She is oriented to person, place, and time. She appears well-developed and well-nourished. No distress.  HENT:  Head: Normocephalic and atraumatic.  Nose: Nose normal.  Eyes: Conjunctivae and EOM are normal. Pupils are equal, round, and reactive to light. Left eye exhibits no discharge. No scleral  icterus.  Cardiovascular: Normal rate and regular rhythm.   Pulmonary/Chest: No respiratory distress.  6 cm area of erythema left axilla, indurated as well. Draining sinus centrally. No necrosis. Appears to be a skin problem, not a deep axillary problem.  Abdominal: Soft. Bowel sounds are normal. She exhibits no distension and no mass. There is no tenderness. There is no rebound and no guarding.  Laparoscopic scars noted. Examined supine and standing. Her umbilicus feels intact. She points to the midline immediately below the umbilicus but I do not feel a mass or incarcerated hernia. Objectively, not tender. Skin healthy.  Musculoskeletal: She exhibits no edema and no tenderness.  Neurological: She is alert and oriented to person, place, and time. She exhibits normal muscle tone. Coordination normal.  Skin: Skin is warm. No rash noted. She is not diaphoretic. No erythema. No pallor.  Psychiatric: She has a normal mood and affect. Her behavior is normal. Judgment and thought content normal.   Procedure note: Informed consent obtained. Left axilla prepped and draped. 1% Xylocaine with epinephrine local. 3-4 cm incision made. Indurated, abscess evacuated and cultured. Packed with iodoform gauze. Dry gauze dressing. Tolerated well.  Data Reviewed Office note from PCP  Assessment    Left axillary abscess. With history of recurrence, hidradenitis as possible, but diagnosis is not established given singles draining sinus  Addendum: Wound culture shows MRSA. Anticipate doxycycline will be appropriate antibiotic.  Umbilical pain. Hernia not diagnosed on exam  Anxiety and depression  History laparoscopic cholecystectomy     Plan    Doxycycline 100 mg by mouth twice a day x10 days. Discontinue Augmentin  Wound care discussed. Remove packing tomorrow. Shower twice a day. Redressed with dry gauze twice a day.  Discussed the uncertainty of diagnosis of incisional or umbilical hernia. She  declined CAT scan but is willing to get ultrasound. We'll set that up  Return to see me in 2-3 weeks.        [name] M 04/24/2014, 4:02 PM

## 2014-04-26 ENCOUNTER — Ambulatory Visit (INDEPENDENT_AMBULATORY_CARE_PROVIDER_SITE_OTHER): Payer: Commercial Managed Care - PPO | Admitting: Surgery

## 2014-04-26 ENCOUNTER — Telehealth (INDEPENDENT_AMBULATORY_CARE_PROVIDER_SITE_OTHER): Payer: Self-pay

## 2014-04-26 NOTE — Telephone Encounter (Signed)
Pt s/p left axillary abscess. Pt states that she has been taking her abx as prescribed. Pt states that she feels like her lymph nodes in her neck has become swollen and tender. Pt denies any fevers, chills, drainage or odors coming from the abscess area. Pt doesn't remember if she has taken her pain meds this am. Advised pt that she can place ice on the area for any swelling. She can also take up to 800mg  Ibuprofen as well for pain relief. Informed pt that I would send a message to Dr Dalbert Batman aware of her lymph nodes swelling. Pt verbalized understanding.

## 2014-04-29 ENCOUNTER — Encounter (INDEPENDENT_AMBULATORY_CARE_PROVIDER_SITE_OTHER): Payer: Self-pay

## 2014-04-29 ENCOUNTER — Ambulatory Visit (HOSPITAL_COMMUNITY): Payer: Commercial Managed Care - PPO

## 2014-04-29 ENCOUNTER — Telehealth (INDEPENDENT_AMBULATORY_CARE_PROVIDER_SITE_OTHER): Payer: Self-pay

## 2014-04-29 NOTE — Telephone Encounter (Signed)
Pt called stating she was seen over weekend at an urgent care due to painful swollen nodes in back of neck on left side. She was started on Mobic. Pt requesting her culture result to be sure she is on correct antibiotic. Glenda advised and will follow up on culture result and call pt.

## 2014-04-30 NOTE — Telephone Encounter (Signed)
Tell her that the culture shows MRSA. Tell her that doxycycline is the correct antibiotic for this and to continue the antibiotic until it is completely gone. I will discuss this further with her at the next office visit   hmi

## 2014-04-30 NOTE — Telephone Encounter (Signed)
Pt was calling back to see if Dr Dalbert Batman had gotten the results from her culture. Advised pt that I would send Dr Dalbert Batman and Holley Raring and message. Pt requests that Dr Dalbert Batman give her a call back and let her know what all is going on.

## 2014-05-01 NOTE — Telephone Encounter (Signed)
Called pt to inform her of Dr Darrel Hoover recommendations. Pt states that she is still having pain on her left side, where she has swollen lymph nodes. Pt states that her left side of her scalp has also started to have breakouts. Pt states that she has been taking the Mobic, and this has helped her some but she is still in some pain. Pt also states that she has had 2 teeth pulled and had implants placed on 7/20 and she is unsure if this has anything to do with any of her symptoms. Pt is very frustrated at this time. Advised pt that I would send Dr Dalbert Batman to let him know that she continues to have these symptoms and for any further recommendations. Informed pt that we will give her a call back as soon as we receive a response from Dr Dalbert Batman. Pt verbalized understanding and agrees with POC.

## 2014-05-01 NOTE — Telephone Encounter (Signed)
Informed pt of Dr Darrel Hoover message below. She states that she will call us back if she feels that this becomes worse and she needs to be seen in the urgent office.

## 2014-05-01 NOTE — Telephone Encounter (Signed)
Called pt unable to leave a message. Please inform pt of Dr Darrel Hoover message below.

## 2014-05-01 NOTE — Telephone Encounter (Signed)
I doubt that left scalp or neck symptoms related to axillary abscess. Options are:   Continue doxycycline and call if worsens, see her dentist, come to urgent office for exam. Hard to evaluate over phone.  hmi

## 2014-05-07 ENCOUNTER — Ambulatory Visit (INDEPENDENT_AMBULATORY_CARE_PROVIDER_SITE_OTHER): Payer: Commercial Managed Care - PPO | Admitting: General Surgery

## 2014-05-07 ENCOUNTER — Encounter (INDEPENDENT_AMBULATORY_CARE_PROVIDER_SITE_OTHER): Payer: Self-pay | Admitting: General Surgery

## 2014-05-07 VITALS — BP 126/70 | HR 72 | Temp 98.0°F | Resp 18 | Ht 67.0 in | Wt 171.0 lb

## 2014-05-07 DIAGNOSIS — IMO0002 Reserved for concepts with insufficient information to code with codable children: Secondary | ICD-10-CM

## 2014-05-07 DIAGNOSIS — L02412 Cutaneous abscess of left axilla: Secondary | ICD-10-CM

## 2014-05-07 NOTE — Progress Notes (Addendum)
Patient ID: [name], female   DOB: 06-05-1966, 48 y.o.   MRN: [id number]  History: This patient underwent incision and drainage Of left axillary abscess by me on 04/24/2014. Cultures grew MRSA. She is on doxycycline.  She also complained of pain at her umbilicus. She reported prior laparoscopic surgery and history laparoscopic cholecystectomy. We could not detect a hernia. CT or ultrasound was recommended, But she decided against that because of cost. She says the umbilicus does not bother her very much and doesn't want to do anything further about this.  Exam: Patient looks well. No distress Left axillary wound has completely healed. There is no residual infection or fluid. There is no drainage. She has minor folliculitis under the axilla but no abscess  Assessment: Left axillary abscess, MRSA Umbilical pain, Improved. No evidence of hernia on exam Anxiety and depression  History of psychotic depression 2003, on antipsychotic medication Hyperlipidemia Mild obesity Hypertension History laparoscopic cholecystectomy   Plan: Betadine scrub under both axillae with bowel site daily Return to see [place] if further surgical problems arise.      Edsel Petrin. Dalbert Batman, M.D., [facility] General and Minimally invasive Surgery Breast and Colorectal Surgery Office:   [phone] Pager:   [phone]

## 2014-05-07 NOTE — Patient Instructions (Signed)
The abscess in your left axilla has now healed. This was MRSA and the doxycycline took care of the infection. There is no drainage and you do not need a bandage.  I recommend that you scrub under both arms daily with Dial soap.  Return to see Dr. Dalbert Batman if further problems arise.

## 2015-04-09 ENCOUNTER — Other Ambulatory Visit: Payer: Self-pay

## 2015-11-14 ENCOUNTER — Other Ambulatory Visit: Payer: Self-pay | Admitting: Obstetrics and Gynecology

## 2016-04-14 ENCOUNTER — Other Ambulatory Visit: Payer: Self-pay | Admitting: Obstetrics and Gynecology

## 2016-04-17 ENCOUNTER — Other Ambulatory Visit: Payer: Self-pay | Admitting: Obstetrics and Gynecology

## 2016-04-17 DIAGNOSIS — R220 Localized swelling, mass and lump, head: Secondary | ICD-10-CM

## 2016-04-19 ENCOUNTER — Other Ambulatory Visit: Payer: Self-pay | Admitting: Obstetrics and Gynecology

## 2016-04-19 DIAGNOSIS — R1904 Left lower quadrant abdominal swelling, mass and lump: Secondary | ICD-10-CM

## 2016-04-29 ENCOUNTER — Ambulatory Visit
Admission: RE | Admit: 2016-04-29 | Discharge: 2016-04-29 | Disposition: A | Discharge status: Home / Self Care | Payer: 59 | Source: Ambulatory Visit | Attending: Obstetrics and Gynecology | Admitting: Obstetrics and Gynecology

## 2016-04-29 ENCOUNTER — Other Ambulatory Visit: Payer: Self-pay | Admitting: Obstetrics and Gynecology

## 2016-04-29 DIAGNOSIS — R1904 Left lower quadrant abdominal swelling, mass and lump: Secondary | ICD-10-CM

## 2016-09-24 ENCOUNTER — Ambulatory Visit: Payer: Self-pay

## 2016-09-24 DIAGNOSIS — J40 Bronchitis, not specified as acute or chronic: Secondary | ICD-10-CM | POA: Diagnosis not present

## 2016-11-15 DIAGNOSIS — H04123 Dry eye syndrome of bilateral lacrimal glands: Secondary | ICD-10-CM | POA: Diagnosis not present

## 2017-01-25 DIAGNOSIS — R1083 Colic: Secondary | ICD-10-CM | POA: Diagnosis not present

## 2017-01-25 DIAGNOSIS — R1084 Generalized abdominal pain: Secondary | ICD-10-CM | POA: Diagnosis not present

## 2017-01-25 DIAGNOSIS — M549 Dorsalgia, unspecified: Secondary | ICD-10-CM | POA: Diagnosis not present

## 2017-02-18 DIAGNOSIS — Z1211 Encounter for screening for malignant neoplasm of colon: Secondary | ICD-10-CM | POA: Diagnosis not present

## 2017-04-01 DIAGNOSIS — I10 Essential (primary) hypertension: Secondary | ICD-10-CM | POA: Diagnosis not present

## 2017-04-01 DIAGNOSIS — J0100 Acute maxillary sinusitis, unspecified: Secondary | ICD-10-CM | POA: Diagnosis not present

## 2017-04-20 IMAGING — US US PELVIS LIMITED
1 series · 5 of 5 positions shown · non-contrast
Comparison: CT 02/23/2013.

CLINICAL DATA: Left lower quadrant/ left back lump.

EXAM:
US PELVIS LIMITED
TECHNIQUE: Ultrasound examination of the soft tissues in the region of clinical
concern was performed in the area of clinical concern.

[Series 1: us pelvis limited · 0.06mm/px · 5 of 5 slices shown]
[im 1/5]
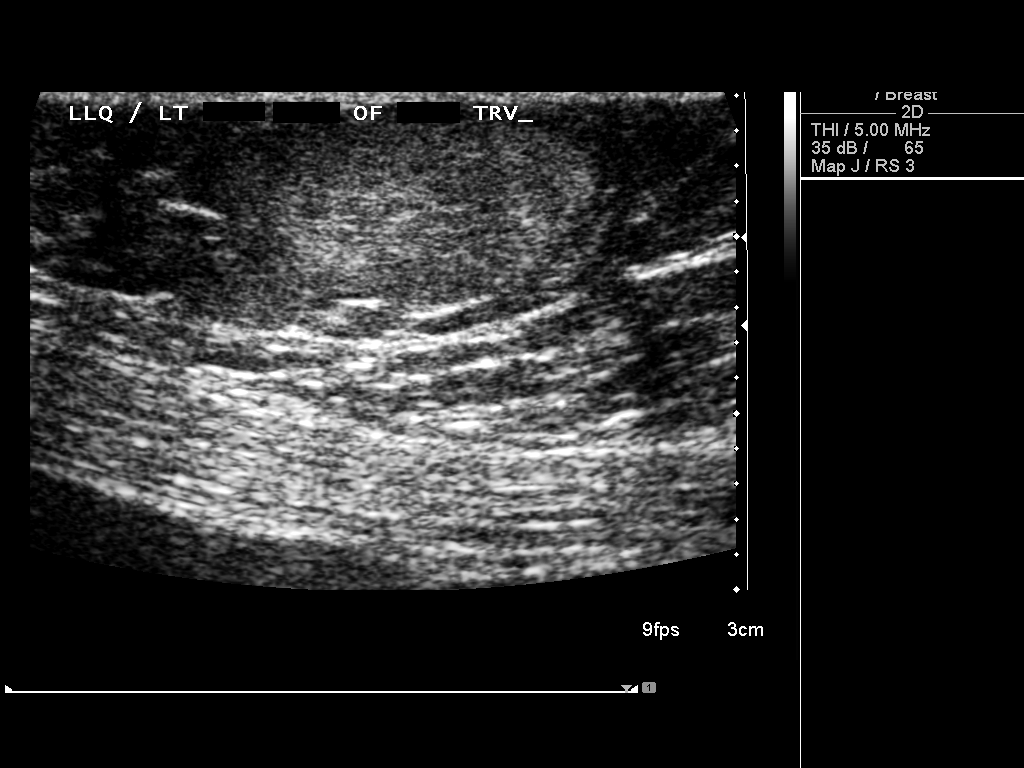
[im 2/5]
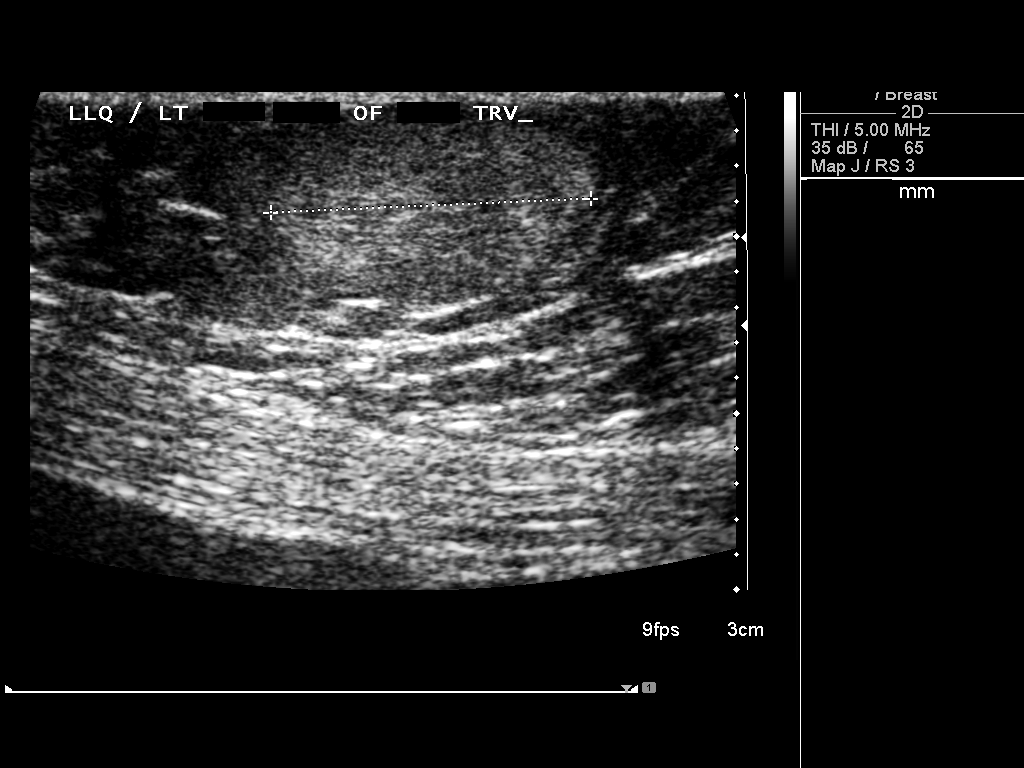
[im 3/5]
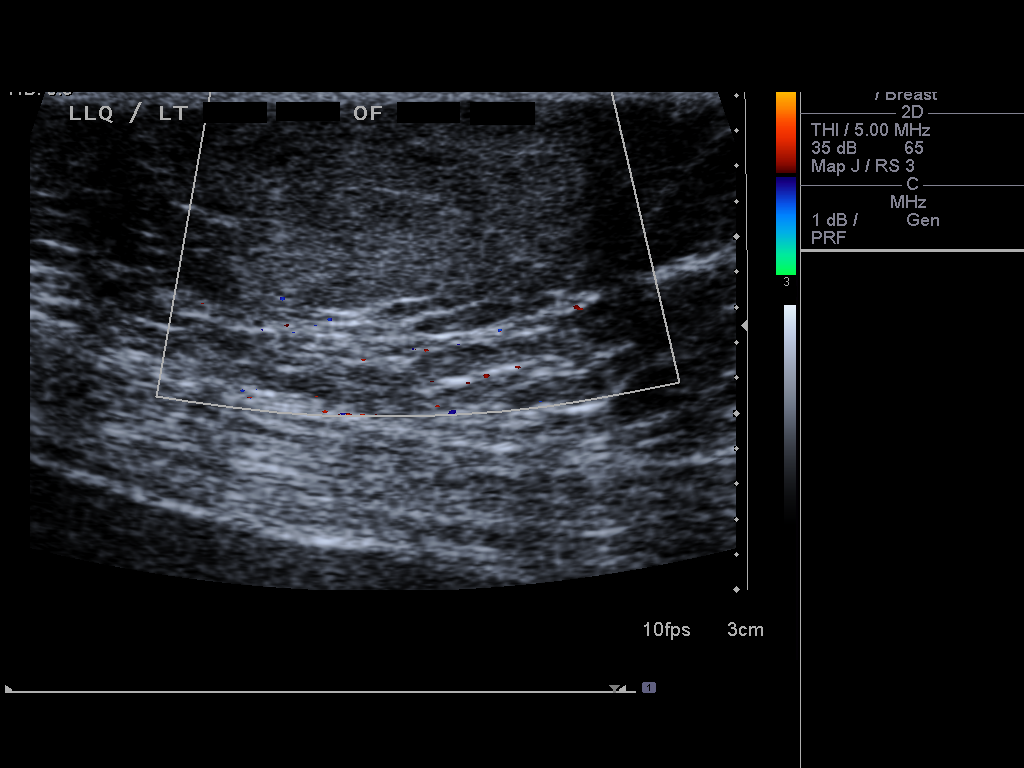
[im 4/5]
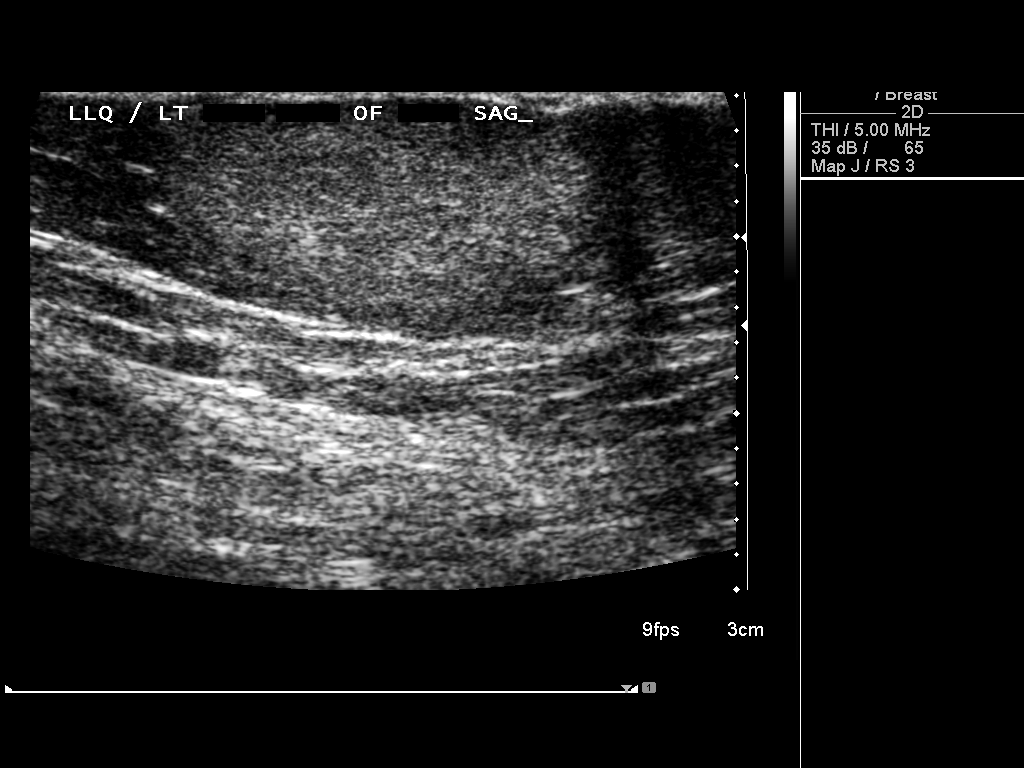
[im 5/5]
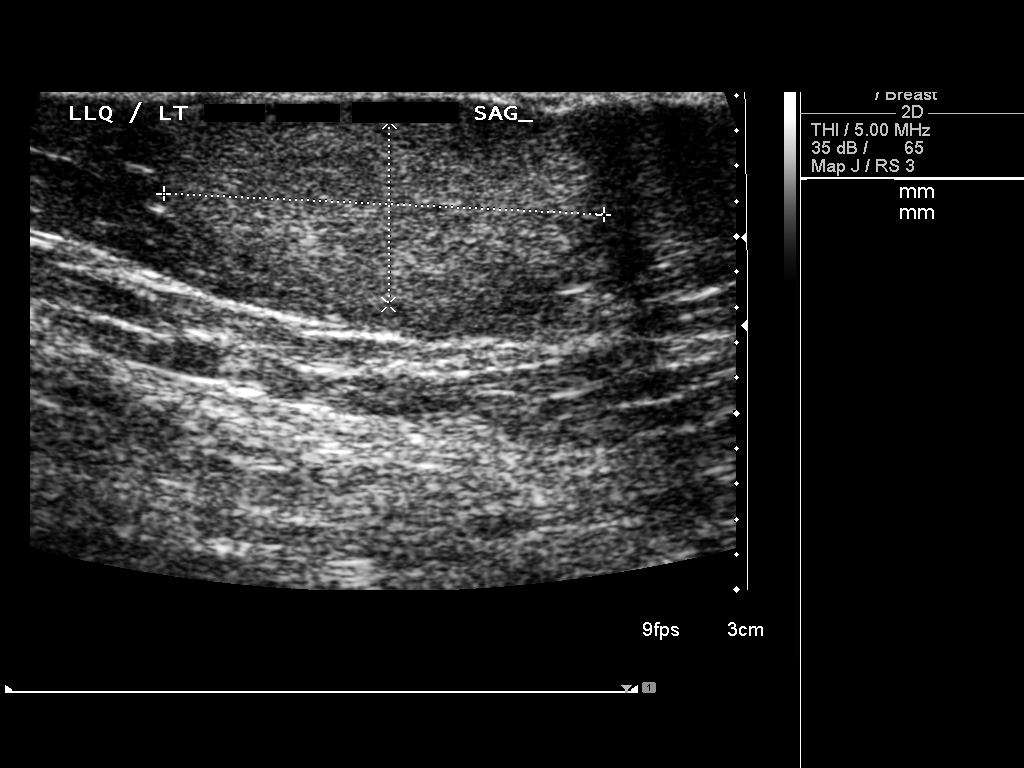

[5 of 5 positions shown; findings below may reference images not displayed]

FINDINGS: 2.5 x 1.0 x 1.8 cm slightly hyperechoic rounded mass is noted along
the left back in the area of clinical concern. No internal color
flow noted. No cystic abnormality noted. This is most likely a
lipoma. Confirmation with gadolinium-enhanced MRI can be obtained.
IMPRESSION: 2.5 x 1.0 x 1.8 cm slightly hyperechoic rounded mass is noted along
the left back in the region of clinical concern. This most likely a
lipoma.

## 2017-05-02 DIAGNOSIS — K648 Other hemorrhoids: Secondary | ICD-10-CM | POA: Diagnosis not present

## 2017-05-02 DIAGNOSIS — K649 Unspecified hemorrhoids: Secondary | ICD-10-CM | POA: Diagnosis not present

## 2017-05-02 DIAGNOSIS — Z1211 Encounter for screening for malignant neoplasm of colon: Secondary | ICD-10-CM | POA: Diagnosis not present

## 2017-05-02 DIAGNOSIS — D124 Benign neoplasm of descending colon: Secondary | ICD-10-CM | POA: Diagnosis not present

## 2017-05-02 HISTORY — PX: COLONOSCOPY: SHX174

## 2017-05-02 LAB — HM COLONOSCOPY

## 2017-06-28 DIAGNOSIS — Z23 Encounter for immunization: Secondary | ICD-10-CM | POA: Diagnosis not present

## 2017-08-16 DIAGNOSIS — Z124 Encounter for screening for malignant neoplasm of cervix: Secondary | ICD-10-CM | POA: Diagnosis not present

## 2017-08-16 DIAGNOSIS — Z1231 Encounter for screening mammogram for malignant neoplasm of breast: Secondary | ICD-10-CM | POA: Diagnosis not present

## 2017-08-16 DIAGNOSIS — Z01419 Encounter for gynecological examination (general) (routine) without abnormal findings: Secondary | ICD-10-CM | POA: Diagnosis not present

## 2017-08-18 DIAGNOSIS — E782 Mixed hyperlipidemia: Secondary | ICD-10-CM | POA: Diagnosis not present

## 2017-08-18 DIAGNOSIS — M2550 Pain in unspecified joint: Secondary | ICD-10-CM | POA: Diagnosis not present

## 2017-08-25 DIAGNOSIS — J3089 Other allergic rhinitis: Secondary | ICD-10-CM | POA: Diagnosis not present

## 2017-09-02 ENCOUNTER — Encounter: Payer: Self-pay | Admitting: Podiatry

## 2017-09-02 ENCOUNTER — Ambulatory Visit (INDEPENDENT_AMBULATORY_CARE_PROVIDER_SITE_OTHER): Payer: 59

## 2017-09-02 ENCOUNTER — Ambulatory Visit: Payer: 59 | Admitting: Podiatry

## 2017-09-02 ENCOUNTER — Other Ambulatory Visit: Payer: Self-pay | Admitting: Podiatry

## 2017-09-02 DIAGNOSIS — M722 Plantar fascial fibromatosis: Secondary | ICD-10-CM

## 2017-09-02 DIAGNOSIS — N898 Other specified noninflammatory disorders of vagina: Secondary | ICD-10-CM | POA: Insufficient documentation

## 2017-09-02 DIAGNOSIS — G5762 Lesion of plantar nerve, left lower limb: Secondary | ICD-10-CM

## 2017-09-02 DIAGNOSIS — E7800 Pure hypercholesterolemia, unspecified: Secondary | ICD-10-CM | POA: Insufficient documentation

## 2017-09-02 DIAGNOSIS — M7660 Achilles tendinitis, unspecified leg: Secondary | ICD-10-CM

## 2017-09-02 DIAGNOSIS — M216X9 Other acquired deformities of unspecified foot: Secondary | ICD-10-CM

## 2017-09-02 NOTE — Progress Notes (Signed)
  Subjective:  Patient ID: [name], female    DOB: 1966/01/11,  MRN: [id number]  Chief Complaint  Patient presents with  . Foot Pain    Plantar heel left - aching x few weeks, AM pain, tried rolling massage which helped in AM, OTC insoles, and good shoes-no help   51 y.o. female presents with the above complaint.  Left plantar heel pain aching has a few months.  Pain worse in the a.m.  Has tried a roller.  Has tried over-the-counter answers without help.  Past Medical History:  Diagnosis Date  . Arthritis   . Blood transfusion without reported diagnosis   . GERD (gastroesophageal reflux disease)   . Hyperlipidemia    Past Surgical History:  Procedure Laterality Date  . ABDOMINAL HYSTERECTOMY    . CESAREAN SECTION    . GALLBLADDER SURGERY    . TUBAL LIGATION      Current Outpatient Medications:  .  ALPRAZolam (XANAX) 0.5 MG tablet, alprazolam 0.5 mg tablet  take 1 tablet by mouth twice a day if needed, Disp: , Rfl:  .  amLODipine (NORVASC) 5 MG tablet, , Disp: , Rfl:  .  atorvastatin (LIPITOR) 10 MG tablet, , Disp: , Rfl:  .  buPROPion (WELLBUTRIN SR) 150 MG 12 hr tablet, bupropion HCl SR 150 mg tablet,12 hr sustained-release, Disp: , Rfl:  .  celecoxib (CELEBREX) 200 MG capsule, Celebrex 200 mg capsule, Disp: , Rfl:  .  dicyclomine (BENTYL) 10 MG capsule, dicyclomine 10 mg capsule, Disp: , Rfl:  .  lansoprazole (PREVACID) 30 MG capsule, , Disp: , Rfl: 1 .  mometasone (NASONEX) 50 MCG/ACT nasal spray, mometasone 50 mcg/actuation nasal spray, Disp: , Rfl:  .  RESTASIS MULTIDOSE 0.05 % ophthalmic emulsion, , Disp: , Rfl: 0 .  topiramate (TOPAMAX) 50 MG tablet, , Disp: , Rfl: 0  Allergies  Allergen Reactions  . Codeine   . Levaquin [Levofloxacin In D5w]   . Tape Hives   Review of Systems  All other systems reviewed and are negative.  Objective:  There were no vitals filed for this visit. General AA&O x3. Normal mood and affect.  Vascular Dorsalis pedis and  posterior tibial pulses  present 2+ bilaterally  Capillary refill normal to all digits. Pedal hair growth normal.  Neurologic Epicritic sensation grossly present.  Dermatologic No open lesions. Interspaces clear of maceration. Nails well groomed and normal in appearance.  Orthopedic: MMT 5/5 in dorsiflexion, plantarflexion, inversion, and eversion. Normal joint ROM without pain or crepitus. Pain to palpation left heel   Radiographs: taken and reviewed no acute fractures dislocations.  Evidence of prior bunion correction  Assessment & Plan:  Patient was evaluated and treated and all questions answered.  Plantar Fasciitis, left - XR reviewed as above.  - Educated on icing and stretching. Instructions given.  - Injection delivered to the plantar fascia as below. - Night splint dispensed.  Procedure: Injection Tendon/Ligament Location: Left plantar fascia at the glabrous junction; medial approach. Skin Prep: Alcohol. Injectate: 1 cc 0.5% marcaine plain, 1 cc dexamethasone phosphate, 0.5 cc kenalog 10. Disposition: Patient tolerated procedure well. Injection site dressed with a band-aid.  Neuroma left third interspace -Injection delivered as below.  Procedure: Neuroma Injection Location: Left 3rd interspace Skin Prep: Alcohol. Injectate: 0.5 cc 0.5% marcaine plain, 0.5 cc dexamethasone phosphate. Disposition: Patient tolerated procedure well. Injection site dressed with a band-aid.  Return in about 3 weeks (around 09/23/2017).

## 2017-09-02 NOTE — Patient Instructions (Signed)

## 2017-09-02 NOTE — Progress Notes (Unsigned)
   Subjective:    Patient ID: [name], female    DOB: 01-Dec-1965, 51 y.o.   MRN: [id number]  HPI  Chief Complaint  Patient presents with  . Foot Pain    Left heel and arch pain - Hx bunion surgery same foot/has tried insoles, roller massage, good shoes - nothing helps       Review of Systems  All other systems reviewed and are negative.      Objective:   Physical Exam        Assessment & Plan:

## 2017-09-14 DIAGNOSIS — Z23 Encounter for immunization: Secondary | ICD-10-CM | POA: Diagnosis not present

## 2017-09-15 ENCOUNTER — Telehealth: Payer: Self-pay | Admitting: Podiatry

## 2017-09-15 NOTE — Telephone Encounter (Signed)
Per Dr March Rummage the patient can come in on Monday 09/19/17 to be seen and I stated to cheryl to call and make the appointment for the patient. Lattie Haw

## 2017-09-15 NOTE — Telephone Encounter (Signed)
Patient says ball of foot on left is tingling/numb.

## 2017-09-27 ENCOUNTER — Ambulatory Visit: Payer: 59 | Admitting: Podiatry

## 2017-10-03 ENCOUNTER — Ambulatory Visit: Payer: 59 | Admitting: Podiatry

## 2017-11-21 DIAGNOSIS — J0101 Acute recurrent maxillary sinusitis: Secondary | ICD-10-CM | POA: Diagnosis not present

## 2017-12-21 ENCOUNTER — Telehealth: Payer: Self-pay | Admitting: *Deleted

## 2017-12-21 NOTE — Telephone Encounter (Signed)
Pt states she received shots in her toes 08/2017, and now her toes are messed up and that is why she hasn't paid her bill, but she wants to come in.

## 2018-03-27 DIAGNOSIS — K2970 Gastritis, unspecified, without bleeding: Secondary | ICD-10-CM | POA: Diagnosis not present

## 2018-04-11 DIAGNOSIS — M5416 Radiculopathy, lumbar region: Secondary | ICD-10-CM | POA: Diagnosis not present

## 2018-04-11 DIAGNOSIS — M9905 Segmental and somatic dysfunction of pelvic region: Secondary | ICD-10-CM | POA: Diagnosis not present

## 2018-04-11 DIAGNOSIS — M9903 Segmental and somatic dysfunction of lumbar region: Secondary | ICD-10-CM | POA: Diagnosis not present

## 2018-04-18 DIAGNOSIS — M9903 Segmental and somatic dysfunction of lumbar region: Secondary | ICD-10-CM | POA: Diagnosis not present

## 2018-04-18 DIAGNOSIS — M9905 Segmental and somatic dysfunction of pelvic region: Secondary | ICD-10-CM | POA: Diagnosis not present

## 2018-04-18 DIAGNOSIS — M5416 Radiculopathy, lumbar region: Secondary | ICD-10-CM | POA: Diagnosis not present

## 2018-05-08 DIAGNOSIS — R109 Unspecified abdominal pain: Secondary | ICD-10-CM | POA: Diagnosis not present

## 2018-05-08 DIAGNOSIS — J42 Unspecified chronic bronchitis: Secondary | ICD-10-CM | POA: Diagnosis not present

## 2018-05-18 DIAGNOSIS — M9905 Segmental and somatic dysfunction of pelvic region: Secondary | ICD-10-CM | POA: Diagnosis not present

## 2018-05-18 DIAGNOSIS — M5416 Radiculopathy, lumbar region: Secondary | ICD-10-CM | POA: Diagnosis not present

## 2018-05-18 DIAGNOSIS — M9903 Segmental and somatic dysfunction of lumbar region: Secondary | ICD-10-CM | POA: Diagnosis not present

## 2018-05-20 DIAGNOSIS — R109 Unspecified abdominal pain: Secondary | ICD-10-CM | POA: Diagnosis not present

## 2018-07-14 DIAGNOSIS — J329 Chronic sinusitis, unspecified: Secondary | ICD-10-CM | POA: Diagnosis not present

## 2018-07-14 DIAGNOSIS — J40 Bronchitis, not specified as acute or chronic: Secondary | ICD-10-CM | POA: Diagnosis not present

## 2018-07-14 DIAGNOSIS — R062 Wheezing: Secondary | ICD-10-CM | POA: Diagnosis not present

## 2018-08-14 ENCOUNTER — Encounter: Payer: Self-pay | Admitting: Pulmonary Disease

## 2018-08-14 ENCOUNTER — Ambulatory Visit: Payer: 59 | Admitting: Pulmonary Disease

## 2018-08-14 VITALS — BP 126/84 | HR 77 | Ht 67.0 in | Wt 192.0 lb

## 2018-08-14 DIAGNOSIS — R05 Cough: Secondary | ICD-10-CM

## 2018-08-14 DIAGNOSIS — Z720 Tobacco use: Secondary | ICD-10-CM

## 2018-08-14 DIAGNOSIS — R059 Cough, unspecified: Secondary | ICD-10-CM

## 2018-08-14 DIAGNOSIS — R058 Other specified cough: Secondary | ICD-10-CM

## 2018-08-14 DIAGNOSIS — J410 Simple chronic bronchitis: Secondary | ICD-10-CM

## 2018-08-14 MED ORDER — ALBUTEROL SULFATE HFA 108 (90 BASE) MCG/ACT IN AERS: 2.0000 | INHALATION_SPRAY | Freq: Four times a day (QID) | RESPIRATORY_TRACT | 5 refills | Status: DC | PRN

## 2018-08-14 NOTE — Patient Instructions (Addendum)
Thank you for visiting Dr. Valeta Harms at [facility].  Today we recommend the following:  Meds ordered this encounter  Medications  . albuterol (PROVENTIL HFA;VENTOLIN HFA) 108 (90 Base) MCG/ACT inhaler    Sig: Inhale 2 puffs into the lungs every 6 (six) hours as needed for wheezing or shortness of breath.    Dispense:  1 Inhaler    Refill:  5   Return in about 3 months (around 11/14/2018).

## 2018-08-14 NOTE — Progress Notes (Signed)
Synopsis: Referred in November 2019 for concern of COPD by Myer Peer, MD  Subjective:   [name] GENDER: female DOB: [birth year], MRN: [id number]  Chief Complaint  Dana presents with  . Consult    States she has allergy problems and she has had a cold x 1 month. States she is having pain in back. Has had 2 rounds of antibiotic and constanly has pain when she is breathing. Reports she has a cough with thick mucous.     PMH of anxiety and depression. She has multiple recurrent URI symptoms. Current smoker, Smoked on and off since age 78 years, 46 years and started smoking again 4 years ago, 1.5ppd. She normally smokes less when its cold because she does not smoke in her house. She was works for [employer] here in [place]. She works in the office. But occasional goes on the floor. No heavy dust exposures. She has significant allergies, usually worse in the spring and summer.  Dana complains of daily sputum production.  Which is sometimes tan-brown in color.  She has had multiple rounds of antibiotics as well as treatments with Kenalog shots over the several months.  Each time she gets steroid she feels a little bit better for a short period of time.  She was started on Spiriva Respimat which she does believe has improved her symptoms.  She is very concerned that her dad had COPD and died of pneumonia.  Her mother is also followed here in clinic for the treatment of bronchiectasis.  She is also afraid that she may have something like that.  She is cost conscious and is concerned that multiple tests may cost a lot of money and would like to do things in the most conservative method if possible.   Past Medical History:  Diagnosis Date  . Arthritis   . Blood transfusion without reported diagnosis   . GERD (gastroesophageal reflux disease)   . Hyperlipidemia      Family History  Problem Relation Age of Onset  . COPD Father      Past Surgical History:    Procedure Laterality Date  . ABDOMINAL HYSTERECTOMY    . CESAREAN SECTION    . GALLBLADDER SURGERY    . TUBAL LIGATION      Social History   Socioeconomic History  . Marital status: Widowed    Spouse name: Not on file  . Number of children: Not on file  . Years of education: Not on file  . Highest education level: Not on file  Occupational History  . Not on file  Social Needs  . Financial resource strain: Not on file  . Food insecurity:    Worry: Not on file    Inability: Not on file  . Transportation needs:    Medical: Not on file    Non-medical: Not on file  Tobacco Use  . Smoking status: Current Every Day Smoker    Types: Cigarettes  . Smokeless tobacco: Never Used  . Tobacco comment: smokes a pack and a half daily 11.25.19  Substance and Sexual Activity  . Alcohol use: Yes  . Drug use: No  . Sexual activity: Not on file  Lifestyle  . Physical activity:    Days per week: Not on file    Minutes per session: Not on file  . Stress: Not on file  Relationships  . Social connections:    Talks on phone: Not on file    Gets together: Not  on file    Attends religious service: Not on file    Active member of club or organization: Not on file    Attends meetings of clubs or organizations: Not on file    Relationship status: Not on file  . Intimate partner violence:    Fear of current or ex partner: Not on file    Emotionally abused: Not on file    Physically abused: Not on file    Forced sexual activity: Not on file  Other Topics Concern  . Not on file  Social History Narrative  . Not on file     Allergies  Allergen Reactions  . Codeine   . Levaquin [Levofloxacin In D5w]   . Tape Hives     Outpatient Medications Prior to Visit  Medication Sig Dispense Refill  . albuterol (PROVENTIL) (2.5 MG/3ML) 0.083% nebulizer solution U 1 NEBULE VIA NEB QID FOR WHZ  6  . ALPRAZolam (XANAX) 0.5 MG tablet alprazolam 0.5 mg tablet  take 1 tablet by mouth twice a day if  needed    . amLODipine (NORVASC) 5 MG tablet     . atorvastatin (LIPITOR) 10 MG tablet     . dicyclomine (BENTYL) 10 MG capsule dicyclomine 10 mg capsule    . lansoprazole (PREVACID) 30 MG capsule   1  . mometasone (NASONEX) 50 MCG/ACT nasal spray mometasone 50 mcg/actuation nasal spray    . neomycin-polymyxin-hydrocortisone (CORTISPORIN) OTIC solution USE 4 DROPS TO RIGHT EAR TID  0  . RESTASIS MULTIDOSE 0.05 % ophthalmic emulsion   0  . SPIRIVA RESPIMAT 2.5 MCG/ACT AERS     . topiramate (TOPAMAX) 50 MG tablet   0  . buPROPion (WELLBUTRIN SR) 150 MG 12 hr tablet bupropion HCl SR 150 mg tablet,12 hr sustained-release    . celecoxib (CELEBREX) 200 MG capsule Celebrex 200 mg capsule     No facility-administered medications prior to visit.     Review of Systems  Constitutional: Negative for chills, fever, malaise/fatigue and weight loss.  HENT: Negative for hearing loss, sore throat and tinnitus.   Eyes: Negative for blurred vision and double vision.  Respiratory: Positive for cough and sputum production. Negative for hemoptysis, shortness of breath, wheezing and stridor.   Cardiovascular: Negative for chest pain, palpitations, orthopnea, leg swelling and PND.  Gastrointestinal: Negative for abdominal pain, constipation, diarrhea, heartburn, nausea and vomiting.  Genitourinary: Negative for dysuria, hematuria and urgency.  Musculoskeletal: Negative for joint pain and myalgias.  Skin: Negative for itching and rash.  Neurological: Negative for dizziness, tingling, weakness and headaches.  Endo/Heme/Allergies: Negative for environmental allergies. Does not bruise/bleed easily.  Psychiatric/Behavioral: Negative for depression. The Dana is not nervous/anxious and does not have insomnia.   All other systems reviewed and are negative.    Objective:  Physical Exam  Constitutional: She is oriented to person, place, and time. She appears well-developed and well-nourished. No distress.    HENT:  Head: Normocephalic and atraumatic.  Mouth/Throat: Oropharynx is clear and moist.  Eyes: Pupils are equal, round, and reactive to light. Conjunctivae are normal. No scleral icterus.  Neck: Neck supple. No JVD present. No tracheal deviation present.  Cardiovascular: Normal rate, regular rhythm, normal heart sounds and intact distal pulses.  No murmur heard. Pulmonary/Chest: Effort normal and breath sounds normal. No accessory muscle usage or stridor. No tachypnea. No respiratory distress. She has no wheezes. She has no rhonchi. She has no rales.  Abdominal: Soft. Bowel sounds are normal. She exhibits no distension. There  is no tenderness.  Musculoskeletal: She exhibits no edema or tenderness.  Lymphadenopathy:    She has no cervical adenopathy.  Neurological: She is alert and oriented to person, place, and time.  Skin: Skin is warm and dry. Capillary refill takes less than 2 seconds. No rash noted.  Psychiatric: She has a normal mood and affect. Her behavior is normal.  Vitals reviewed.    Vitals:   08/14/18 1539  BP: 126/84  Pulse: 77  SpO2: 98%  Weight: 192 lb (87.1 kg)  Height: 5\' 7"  (1.702 m)   98% on RA BMI Readings from Last 3 Encounters:  08/14/18 30.07 kg/m  09/02/17 28.98 kg/m  05/07/14 26.78 kg/m   Wt Readings from Last 3 Encounters:  08/14/18 192 lb (87.1 kg)  09/02/17 185 lb (83.9 kg)  05/07/14 171 lb (77.6 kg)     CBC    Component Value Date/Time   WBC 9.8 08/02/2009 0530   RBC 3.99 08/02/2009 0530   HGB 12.6 08/02/2009 0530   HCT 36.9 08/02/2009 0530   PLT 236 08/02/2009 0530   MCV 92.6 08/02/2009 0530   MCHC 34.0 08/02/2009 0530   RDW 12.2 08/02/2009 0530    Chest Imaging: Dana states that she had a chest x-ray several months ago but she was told by her primary care provider that was normal.  Pulmonary Functions Testing Results: No prior PFTs  FeNO: None   Pathology: None   Echocardiogram: None   Heart Catheterization: None      Assessment & Plan:   Cough - Plan: Pulmonary function test  Sputum production  Tobacco use  Simple chronic bronchitis (HCC)  Discussion:  This is a 52 year old female chronic tobacco abuse ongoing smoking of greater than 1.5 packs/day.  She has a history consistent with chronic bronchitis.  She has several bouts per year.  She has been on antibiotics 4 times in the past few months as well as received several Kenalog injections.  She feels better for a short period of time but then her symptoms recur.  In conjunction she also has symptoms consistent with chronic sinusitis.  I suspect all of this is related to her ongoing smoking.  She may very well have mild COPD however has not had PFTs.  At this time I think we will start with a simple step by obtaining full PFTs.  We will see her back in approximately 3 months.  We recommended starting nicotine patches again and tapering off of cigarettes.  She has quit like this before.  We will give her a new albuterol inhaler prescription.  I agree with continuation of her current Spiriva Respimat.  Return to clinic in 3 months.  Or as needed if his symptoms worsen.  Greater than 50% of this Dana's 60-minute office visit was spent face-to-face discussing the recommendations and treatment approach as well as diagnostics for evaluation of chronic cough and chronic bronchitis.  Additionally we spent a large amount of time talking about smoking cessation.   Current Outpatient Medications:  .  albuterol (PROVENTIL) (2.5 MG/3ML) 0.083% nebulizer solution, U 1 NEBULE VIA NEB QID FOR WHZ, Disp: , Rfl: 6 .  ALPRAZolam (XANAX) 0.5 MG tablet, alprazolam 0.5 mg tablet  take 1 tablet by mouth twice a day if needed, Disp: , Rfl:  .  amLODipine (NORVASC) 5 MG tablet, , Disp: , Rfl:  .  atorvastatin (LIPITOR) 10 MG tablet, , Disp: , Rfl:  .  dicyclomine (BENTYL) 10 MG capsule, dicyclomine 10 mg capsule,  Disp: , Rfl:  .  lansoprazole (PREVACID) 30 MG  capsule, , Disp: , Rfl: 1 .  mometasone (NASONEX) 50 MCG/ACT nasal spray, mometasone 50 mcg/actuation nasal spray, Disp: , Rfl:  .  neomycin-polymyxin-hydrocortisone (CORTISPORIN) OTIC solution, USE 4 DROPS TO RIGHT EAR TID, Disp: , Rfl: 0 .  RESTASIS MULTIDOSE 0.05 % ophthalmic emulsion, , Disp: , Rfl: 0 .  SPIRIVA RESPIMAT 2.5 MCG/ACT AERS, , Disp: , Rfl:  .  topiramate (TOPAMAX) 50 MG tablet, , Disp: , Rfl: 0 .  albuterol (PROVENTIL HFA;VENTOLIN HFA) 108 (90 Base) MCG/ACT inhaler, Inhale 2 puffs into the lungs every 6 (six) hours as needed for wheezing or shortness of breath., Disp: 1 Inhaler, Rfl: 5   Garner Nash, DO [facility] 08/14/2018 6:56 PM

## 2018-08-23 ENCOUNTER — Telehealth: Payer: Self-pay | Admitting: Pulmonary Disease

## 2018-08-23 NOTE — Telephone Encounter (Signed)
The CPT's for a PFT are Either [place] or [place], [place], [place] or [phone]. If [employer] calls back, please give them this information, I attempted to call and was on hold for a very long time.

## 2018-08-23 NOTE — Telephone Encounter (Signed)
Routing to [place] to follow-up.

## 2018-08-25 DIAGNOSIS — R350 Frequency of micturition: Secondary | ICD-10-CM | POA: Diagnosis not present

## 2018-08-25 DIAGNOSIS — Z1231 Encounter for screening mammogram for malignant neoplasm of breast: Secondary | ICD-10-CM | POA: Diagnosis not present

## 2018-08-25 DIAGNOSIS — Z01419 Encounter for gynecological examination (general) (routine) without abnormal findings: Secondary | ICD-10-CM | POA: Diagnosis not present

## 2018-08-25 DIAGNOSIS — N760 Acute vaginitis: Secondary | ICD-10-CM | POA: Diagnosis not present

## 2018-08-28 NOTE — Telephone Encounter (Signed)
ATC was not able to speak with any human.

## 2018-08-29 NOTE — Telephone Encounter (Signed)
Called and spoke with Dorothea Ogle with [facility]. Codes given. Coverage accepted. Nothing further needed.

## 2018-08-31 DIAGNOSIS — Z6830 Body mass index (BMI) 30.0-30.9, adult: Secondary | ICD-10-CM | POA: Diagnosis not present

## 2018-08-31 DIAGNOSIS — B349 Viral infection, unspecified: Secondary | ICD-10-CM | POA: Diagnosis not present

## 2018-09-26 DIAGNOSIS — H04123 Dry eye syndrome of bilateral lacrimal glands: Secondary | ICD-10-CM | POA: Diagnosis not present

## 2018-09-28 DIAGNOSIS — R05 Cough: Secondary | ICD-10-CM | POA: Diagnosis not present

## 2018-09-28 DIAGNOSIS — Z6830 Body mass index (BMI) 30.0-30.9, adult: Secondary | ICD-10-CM | POA: Diagnosis not present

## 2018-09-28 DIAGNOSIS — F17200 Nicotine dependence, unspecified, uncomplicated: Secondary | ICD-10-CM | POA: Diagnosis not present

## 2018-10-12 DIAGNOSIS — J189 Pneumonia, unspecified organism: Secondary | ICD-10-CM | POA: Diagnosis not present

## 2018-10-12 DIAGNOSIS — Z6831 Body mass index (BMI) 31.0-31.9, adult: Secondary | ICD-10-CM | POA: Diagnosis not present

## 2018-10-12 DIAGNOSIS — F17200 Nicotine dependence, unspecified, uncomplicated: Secondary | ICD-10-CM | POA: Diagnosis not present

## 2018-11-09 ENCOUNTER — Ambulatory Visit: Payer: 59 | Admitting: Pulmonary Disease

## 2018-11-09 DIAGNOSIS — Z6830 Body mass index (BMI) 30.0-30.9, adult: Secondary | ICD-10-CM | POA: Diagnosis not present

## 2018-11-09 DIAGNOSIS — M1990 Unspecified osteoarthritis, unspecified site: Secondary | ICD-10-CM | POA: Diagnosis not present

## 2018-11-09 DIAGNOSIS — M5432 Sciatica, left side: Secondary | ICD-10-CM | POA: Diagnosis not present

## 2018-11-15 DIAGNOSIS — E7800 Pure hypercholesterolemia, unspecified: Secondary | ICD-10-CM | POA: Diagnosis not present

## 2018-11-15 DIAGNOSIS — F17210 Nicotine dependence, cigarettes, uncomplicated: Secondary | ICD-10-CM | POA: Diagnosis not present

## 2018-11-15 DIAGNOSIS — R0602 Shortness of breath: Secondary | ICD-10-CM | POA: Diagnosis not present

## 2018-11-15 DIAGNOSIS — I201 Angina pectoris with documented spasm: Secondary | ICD-10-CM | POA: Diagnosis not present

## 2018-11-15 DIAGNOSIS — E669 Obesity, unspecified: Secondary | ICD-10-CM | POA: Diagnosis not present

## 2018-11-20 DIAGNOSIS — M79642 Pain in left hand: Secondary | ICD-10-CM | POA: Diagnosis not present

## 2018-11-20 DIAGNOSIS — M1811 Unilateral primary osteoarthritis of first carpometacarpal joint, right hand: Secondary | ICD-10-CM | POA: Diagnosis not present

## 2018-11-20 DIAGNOSIS — M79641 Pain in right hand: Secondary | ICD-10-CM | POA: Diagnosis not present

## 2018-11-20 DIAGNOSIS — M189 Osteoarthritis of first carpometacarpal joint, unspecified: Secondary | ICD-10-CM | POA: Insufficient documentation

## 2018-11-20 DIAGNOSIS — M7062 Trochanteric bursitis, left hip: Secondary | ICD-10-CM | POA: Diagnosis not present

## 2018-11-29 DIAGNOSIS — M25561 Pain in right knee: Secondary | ICD-10-CM | POA: Diagnosis not present

## 2018-11-30 DIAGNOSIS — M25561 Pain in right knee: Secondary | ICD-10-CM | POA: Diagnosis not present

## 2018-12-06 DIAGNOSIS — J302 Other seasonal allergic rhinitis: Secondary | ICD-10-CM | POA: Diagnosis not present

## 2018-12-06 DIAGNOSIS — M7062 Trochanteric bursitis, left hip: Secondary | ICD-10-CM | POA: Diagnosis not present

## 2018-12-06 DIAGNOSIS — Z6830 Body mass index (BMI) 30.0-30.9, adult: Secondary | ICD-10-CM | POA: Diagnosis not present

## 2018-12-14 DIAGNOSIS — Z6830 Body mass index (BMI) 30.0-30.9, adult: Secondary | ICD-10-CM | POA: Diagnosis not present

## 2018-12-14 DIAGNOSIS — J189 Pneumonia, unspecified organism: Secondary | ICD-10-CM | POA: Diagnosis not present

## 2018-12-19 DIAGNOSIS — M25552 Pain in left hip: Secondary | ICD-10-CM | POA: Insufficient documentation

## 2018-12-19 DIAGNOSIS — M7062 Trochanteric bursitis, left hip: Secondary | ICD-10-CM | POA: Diagnosis not present

## 2019-01-02 DIAGNOSIS — M9905 Segmental and somatic dysfunction of pelvic region: Secondary | ICD-10-CM | POA: Diagnosis not present

## 2019-01-02 DIAGNOSIS — M9903 Segmental and somatic dysfunction of lumbar region: Secondary | ICD-10-CM | POA: Diagnosis not present

## 2019-01-02 DIAGNOSIS — M5416 Radiculopathy, lumbar region: Secondary | ICD-10-CM | POA: Diagnosis not present

## 2019-01-08 DIAGNOSIS — M25552 Pain in left hip: Secondary | ICD-10-CM | POA: Diagnosis not present

## 2019-01-08 DIAGNOSIS — M25652 Stiffness of left hip, not elsewhere classified: Secondary | ICD-10-CM | POA: Diagnosis not present

## 2019-01-08 DIAGNOSIS — M7062 Trochanteric bursitis, left hip: Secondary | ICD-10-CM | POA: Diagnosis not present

## 2019-01-09 DIAGNOSIS — M9905 Segmental and somatic dysfunction of pelvic region: Secondary | ICD-10-CM | POA: Diagnosis not present

## 2019-01-09 DIAGNOSIS — M5416 Radiculopathy, lumbar region: Secondary | ICD-10-CM | POA: Diagnosis not present

## 2019-01-09 DIAGNOSIS — M9903 Segmental and somatic dysfunction of lumbar region: Secondary | ICD-10-CM | POA: Diagnosis not present

## 2019-01-12 DIAGNOSIS — M7062 Trochanteric bursitis, left hip: Secondary | ICD-10-CM | POA: Diagnosis not present

## 2019-01-12 DIAGNOSIS — M25652 Stiffness of left hip, not elsewhere classified: Secondary | ICD-10-CM | POA: Diagnosis not present

## 2019-01-12 DIAGNOSIS — M25552 Pain in left hip: Secondary | ICD-10-CM | POA: Diagnosis not present

## 2019-01-16 DIAGNOSIS — M1711 Unilateral primary osteoarthritis, right knee: Secondary | ICD-10-CM | POA: Diagnosis not present

## 2019-01-16 DIAGNOSIS — M25552 Pain in left hip: Secondary | ICD-10-CM | POA: Diagnosis not present

## 2019-01-16 DIAGNOSIS — M25561 Pain in right knee: Secondary | ICD-10-CM | POA: Diagnosis not present

## 2019-01-18 DIAGNOSIS — M1990 Unspecified osteoarthritis, unspecified site: Secondary | ICD-10-CM | POA: Diagnosis not present

## 2019-01-18 DIAGNOSIS — E785 Hyperlipidemia, unspecified: Secondary | ICD-10-CM | POA: Diagnosis not present

## 2019-01-18 DIAGNOSIS — Z6830 Body mass index (BMI) 30.0-30.9, adult: Secondary | ICD-10-CM | POA: Diagnosis not present

## 2019-01-30 DIAGNOSIS — M5416 Radiculopathy, lumbar region: Secondary | ICD-10-CM | POA: Diagnosis not present

## 2019-01-30 DIAGNOSIS — M1711 Unilateral primary osteoarthritis, right knee: Secondary | ICD-10-CM | POA: Diagnosis not present

## 2019-01-30 DIAGNOSIS — Z01818 Encounter for other preprocedural examination: Secondary | ICD-10-CM | POA: Diagnosis not present

## 2019-01-30 DIAGNOSIS — M9905 Segmental and somatic dysfunction of pelvic region: Secondary | ICD-10-CM | POA: Diagnosis not present

## 2019-01-30 DIAGNOSIS — M9903 Segmental and somatic dysfunction of lumbar region: Secondary | ICD-10-CM | POA: Diagnosis not present

## 2019-02-07 DIAGNOSIS — M25561 Pain in right knee: Secondary | ICD-10-CM | POA: Diagnosis not present

## 2019-02-07 DIAGNOSIS — M4726 Other spondylosis with radiculopathy, lumbar region: Secondary | ICD-10-CM | POA: Diagnosis not present

## 2019-02-07 DIAGNOSIS — M5416 Radiculopathy, lumbar region: Secondary | ICD-10-CM | POA: Diagnosis not present

## 2019-02-07 DIAGNOSIS — M48061 Spinal stenosis, lumbar region without neurogenic claudication: Secondary | ICD-10-CM | POA: Diagnosis not present

## 2019-02-07 DIAGNOSIS — M5116 Intervertebral disc disorders with radiculopathy, lumbar region: Secondary | ICD-10-CM | POA: Diagnosis not present

## 2019-02-09 DIAGNOSIS — M1712 Unilateral primary osteoarthritis, left knee: Secondary | ICD-10-CM | POA: Insufficient documentation

## 2019-02-09 DIAGNOSIS — M1711 Unilateral primary osteoarthritis, right knee: Secondary | ICD-10-CM | POA: Insufficient documentation

## 2019-02-09 HISTORY — PX: REPLACEMENT TOTAL KNEE: SUR1224

## 2019-02-09 HISTORY — PX: TOTAL KNEE ARTHROPLASTY: SHX125

## 2019-04-20 DIAGNOSIS — M9902 Segmental and somatic dysfunction of thoracic region: Secondary | ICD-10-CM | POA: Diagnosis not present

## 2019-04-20 DIAGNOSIS — M5416 Radiculopathy, lumbar region: Secondary | ICD-10-CM | POA: Diagnosis not present

## 2019-04-20 DIAGNOSIS — M9903 Segmental and somatic dysfunction of lumbar region: Secondary | ICD-10-CM | POA: Diagnosis not present

## 2019-04-20 DIAGNOSIS — M9905 Segmental and somatic dysfunction of pelvic region: Secondary | ICD-10-CM | POA: Diagnosis not present

## 2019-05-15 DIAGNOSIS — J40 Bronchitis, not specified as acute or chronic: Secondary | ICD-10-CM | POA: Diagnosis not present

## 2019-05-15 DIAGNOSIS — F419 Anxiety disorder, unspecified: Secondary | ICD-10-CM | POA: Diagnosis not present

## 2019-05-15 DIAGNOSIS — J329 Chronic sinusitis, unspecified: Secondary | ICD-10-CM | POA: Diagnosis not present

## 2019-05-27 DIAGNOSIS — J029 Acute pharyngitis, unspecified: Secondary | ICD-10-CM | POA: Diagnosis not present

## 2019-05-27 DIAGNOSIS — J309 Allergic rhinitis, unspecified: Secondary | ICD-10-CM | POA: Diagnosis not present

## 2019-05-27 DIAGNOSIS — S9032XA Contusion of left foot, initial encounter: Secondary | ICD-10-CM | POA: Diagnosis not present

## 2019-06-05 DIAGNOSIS — Z720 Tobacco use: Secondary | ICD-10-CM

## 2019-06-05 DIAGNOSIS — I201 Angina pectoris with documented spasm: Secondary | ICD-10-CM | POA: Insufficient documentation

## 2019-06-05 DIAGNOSIS — F17200 Nicotine dependence, unspecified, uncomplicated: Secondary | ICD-10-CM | POA: Insufficient documentation

## 2019-06-05 DIAGNOSIS — I208 Other forms of angina pectoris: Secondary | ICD-10-CM | POA: Diagnosis not present

## 2019-06-05 DIAGNOSIS — E782 Mixed hyperlipidemia: Secondary | ICD-10-CM | POA: Diagnosis not present

## 2019-06-05 DIAGNOSIS — F17210 Nicotine dependence, cigarettes, uncomplicated: Secondary | ICD-10-CM | POA: Diagnosis not present

## 2019-06-05 DIAGNOSIS — R079 Chest pain, unspecified: Secondary | ICD-10-CM | POA: Diagnosis not present

## 2019-06-27 DIAGNOSIS — J302 Other seasonal allergic rhinitis: Secondary | ICD-10-CM | POA: Diagnosis not present

## 2019-06-27 DIAGNOSIS — F17200 Nicotine dependence, unspecified, uncomplicated: Secondary | ICD-10-CM | POA: Diagnosis not present

## 2019-06-27 DIAGNOSIS — J40 Bronchitis, not specified as acute or chronic: Secondary | ICD-10-CM | POA: Diagnosis not present

## 2019-06-27 DIAGNOSIS — J329 Chronic sinusitis, unspecified: Secondary | ICD-10-CM | POA: Diagnosis not present

## 2019-07-03 DIAGNOSIS — M9902 Segmental and somatic dysfunction of thoracic region: Secondary | ICD-10-CM | POA: Diagnosis not present

## 2019-07-03 DIAGNOSIS — M9905 Segmental and somatic dysfunction of pelvic region: Secondary | ICD-10-CM | POA: Diagnosis not present

## 2019-07-03 DIAGNOSIS — M5416 Radiculopathy, lumbar region: Secondary | ICD-10-CM | POA: Diagnosis not present

## 2019-07-03 DIAGNOSIS — M9903 Segmental and somatic dysfunction of lumbar region: Secondary | ICD-10-CM | POA: Diagnosis not present

## 2019-07-09 DIAGNOSIS — J302 Other seasonal allergic rhinitis: Secondary | ICD-10-CM | POA: Diagnosis not present

## 2019-07-17 DIAGNOSIS — Z23 Encounter for immunization: Secondary | ICD-10-CM | POA: Diagnosis not present

## 2019-08-21 DIAGNOSIS — M9902 Segmental and somatic dysfunction of thoracic region: Secondary | ICD-10-CM | POA: Diagnosis not present

## 2019-08-21 DIAGNOSIS — M9903 Segmental and somatic dysfunction of lumbar region: Secondary | ICD-10-CM | POA: Diagnosis not present

## 2019-08-21 DIAGNOSIS — M9905 Segmental and somatic dysfunction of pelvic region: Secondary | ICD-10-CM | POA: Diagnosis not present

## 2019-08-21 DIAGNOSIS — M5416 Radiculopathy, lumbar region: Secondary | ICD-10-CM | POA: Diagnosis not present

## 2019-08-28 DIAGNOSIS — J40 Bronchitis, not specified as acute or chronic: Secondary | ICD-10-CM | POA: Diagnosis not present

## 2019-08-28 DIAGNOSIS — Z20828 Contact with and (suspected) exposure to other viral communicable diseases: Secondary | ICD-10-CM | POA: Diagnosis not present

## 2019-08-28 DIAGNOSIS — J329 Chronic sinusitis, unspecified: Secondary | ICD-10-CM | POA: Diagnosis not present

## 2019-08-28 DIAGNOSIS — F17200 Nicotine dependence, unspecified, uncomplicated: Secondary | ICD-10-CM | POA: Diagnosis not present

## 2019-09-06 DIAGNOSIS — J40 Bronchitis, not specified as acute or chronic: Secondary | ICD-10-CM | POA: Diagnosis not present

## 2019-09-06 DIAGNOSIS — E7800 Pure hypercholesterolemia, unspecified: Secondary | ICD-10-CM | POA: Diagnosis not present

## 2019-09-06 DIAGNOSIS — M545 Low back pain: Secondary | ICD-10-CM | POA: Diagnosis not present

## 2019-09-06 DIAGNOSIS — J329 Chronic sinusitis, unspecified: Secondary | ICD-10-CM | POA: Diagnosis not present

## 2019-09-18 DIAGNOSIS — Z471 Aftercare following joint replacement surgery: Secondary | ICD-10-CM | POA: Diagnosis not present

## 2019-09-18 DIAGNOSIS — Z96651 Presence of right artificial knee joint: Secondary | ICD-10-CM | POA: Diagnosis not present

## 2019-09-19 DIAGNOSIS — Z01419 Encounter for gynecological examination (general) (routine) without abnormal findings: Secondary | ICD-10-CM | POA: Diagnosis not present

## 2019-09-19 DIAGNOSIS — Z6830 Body mass index (BMI) 30.0-30.9, adult: Secondary | ICD-10-CM | POA: Diagnosis not present

## 2019-09-25 DIAGNOSIS — J42 Unspecified chronic bronchitis: Secondary | ICD-10-CM | POA: Diagnosis not present

## 2019-09-25 DIAGNOSIS — Z6830 Body mass index (BMI) 30.0-30.9, adult: Secondary | ICD-10-CM | POA: Diagnosis not present

## 2019-09-25 DIAGNOSIS — F17200 Nicotine dependence, unspecified, uncomplicated: Secondary | ICD-10-CM | POA: Diagnosis not present

## 2019-10-04 DIAGNOSIS — J42 Unspecified chronic bronchitis: Secondary | ICD-10-CM | POA: Diagnosis not present

## 2019-10-04 DIAGNOSIS — R0781 Pleurodynia: Secondary | ICD-10-CM | POA: Diagnosis not present

## 2019-10-04 DIAGNOSIS — F17200 Nicotine dependence, unspecified, uncomplicated: Secondary | ICD-10-CM | POA: Diagnosis not present

## 2019-10-10 DIAGNOSIS — Z1231 Encounter for screening mammogram for malignant neoplasm of breast: Secondary | ICD-10-CM | POA: Diagnosis not present

## 2019-10-14 DIAGNOSIS — S42201A Unspecified fracture of upper end of right humerus, initial encounter for closed fracture: Secondary | ICD-10-CM | POA: Diagnosis not present

## 2019-10-15 DIAGNOSIS — M25511 Pain in right shoulder: Secondary | ICD-10-CM | POA: Diagnosis not present

## 2019-10-15 DIAGNOSIS — S42294A Other nondisplaced fracture of upper end of right humerus, initial encounter for closed fracture: Secondary | ICD-10-CM | POA: Diagnosis not present

## 2019-10-29 DIAGNOSIS — S42201D Unspecified fracture of upper end of right humerus, subsequent encounter for fracture with routine healing: Secondary | ICD-10-CM | POA: Diagnosis not present

## 2019-10-31 DIAGNOSIS — Z720 Tobacco use: Secondary | ICD-10-CM | POA: Diagnosis not present

## 2019-11-07 DIAGNOSIS — M25511 Pain in right shoulder: Secondary | ICD-10-CM | POA: Diagnosis not present

## 2019-11-07 DIAGNOSIS — Z96651 Presence of right artificial knee joint: Secondary | ICD-10-CM | POA: Diagnosis not present

## 2019-11-07 DIAGNOSIS — F17200 Nicotine dependence, unspecified, uncomplicated: Secondary | ICD-10-CM | POA: Diagnosis not present

## 2019-11-07 DIAGNOSIS — M25551 Pain in right hip: Secondary | ICD-10-CM | POA: Diagnosis not present

## 2019-11-07 DIAGNOSIS — M25611 Stiffness of right shoulder, not elsewhere classified: Secondary | ICD-10-CM | POA: Diagnosis not present

## 2019-11-07 DIAGNOSIS — Z471 Aftercare following joint replacement surgery: Secondary | ICD-10-CM | POA: Diagnosis not present

## 2019-11-21 DIAGNOSIS — M25511 Pain in right shoulder: Secondary | ICD-10-CM | POA: Diagnosis not present

## 2019-11-21 DIAGNOSIS — M25611 Stiffness of right shoulder, not elsewhere classified: Secondary | ICD-10-CM | POA: Diagnosis not present

## 2019-11-22 ENCOUNTER — Other Ambulatory Visit: Payer: Self-pay

## 2019-11-22 MED ORDER — ALBUTEROL SULFATE HFA 108 (90 BASE) MCG/ACT IN AERS: 2.0000 | INHALATION_SPRAY | Freq: Four times a day (QID) | RESPIRATORY_TRACT | 5 refills | Status: DC | PRN

## 2019-11-23 DIAGNOSIS — S42294A Other nondisplaced fracture of upper end of right humerus, initial encounter for closed fracture: Secondary | ICD-10-CM | POA: Diagnosis not present

## 2019-11-26 DIAGNOSIS — M9905 Segmental and somatic dysfunction of pelvic region: Secondary | ICD-10-CM | POA: Diagnosis not present

## 2019-11-26 DIAGNOSIS — M9901 Segmental and somatic dysfunction of cervical region: Secondary | ICD-10-CM | POA: Diagnosis not present

## 2019-11-26 DIAGNOSIS — M9903 Segmental and somatic dysfunction of lumbar region: Secondary | ICD-10-CM | POA: Diagnosis not present

## 2019-12-05 DIAGNOSIS — M25511 Pain in right shoulder: Secondary | ICD-10-CM | POA: Diagnosis not present

## 2019-12-05 DIAGNOSIS — M25611 Stiffness of right shoulder, not elsewhere classified: Secondary | ICD-10-CM | POA: Diagnosis not present

## 2019-12-06 ENCOUNTER — Other Ambulatory Visit: Payer: Self-pay

## 2019-12-06 ENCOUNTER — Encounter: Payer: Self-pay | Admitting: Gastroenterology

## 2019-12-06 ENCOUNTER — Ambulatory Visit: Payer: 59 | Admitting: Gastroenterology

## 2019-12-06 ENCOUNTER — Ambulatory Visit (INDEPENDENT_AMBULATORY_CARE_PROVIDER_SITE_OTHER): Payer: BC Managed Care – PPO

## 2019-12-06 VITALS — BP 124/84 | HR 109 | Temp 98.2°F | Ht 67.0 in | Wt 189.2 lb

## 2019-12-06 DIAGNOSIS — R198 Other specified symptoms and signs involving the digestive system and abdomen: Secondary | ICD-10-CM

## 2019-12-06 DIAGNOSIS — Z01818 Encounter for other preprocedural examination: Secondary | ICD-10-CM | POA: Diagnosis not present

## 2019-12-06 DIAGNOSIS — Z1159 Encounter for screening for other viral diseases: Secondary | ICD-10-CM

## 2019-12-06 DIAGNOSIS — R1310 Dysphagia, unspecified: Secondary | ICD-10-CM | POA: Diagnosis not present

## 2019-12-06 DIAGNOSIS — K219 Gastro-esophageal reflux disease without esophagitis: Secondary | ICD-10-CM | POA: Diagnosis not present

## 2019-12-06 DIAGNOSIS — R0989 Other specified symptoms and signs involving the circulatory and respiratory systems: Secondary | ICD-10-CM

## 2019-12-06 DIAGNOSIS — R1319 Other dysphagia: Secondary | ICD-10-CM

## 2019-12-06 MED ORDER — PANTOPRAZOLE SODIUM 40 MG PO TBEC: 40.0000 mg | DELAYED_RELEASE_TABLET | Freq: Every day | ORAL | 11 refills | Status: DC

## 2019-12-06 MED ORDER — PANTOPRAZOLE SODIUM 40 MG PO TBEC: 40.0000 mg | DELAYED_RELEASE_TABLET | Freq: Two times a day (BID) | ORAL | 6 refills | Status: DC

## 2019-12-06 NOTE — Progress Notes (Signed)
Chief Complaint: Dysphagia  Referring Provider:  Myer Peer, MD      ASSESSMENT AND PLAN;   #1. GERD with globus sensation/eso dysphagia  #2. H/O colonic polyps 04/2017.  Repeat colonoscopy due 04/2022.  Plan: - Protonix 40mg  po bid #60, 6 refills.  - EGD with dil.  I have discussed risks and benefits in detail including small but definite risks of perforation, bleeding, aspiration. - I have instructed her that she needs to chew food specially meats and breads well and eat slowly. - It still with problems, recommend ENT eval. - Advised her to stop all nonsteroidals.   HPI:    [name] is a 53 y.o. female   C/O Lump in throat ever since she ate at [employer] February 22.  She has been having intermittent dysphagia mostly to solids-food getting hung up in the lower neck upper chest area.  She has been tried on omeprazole which did not help.  She continues to have occasional heartburn.  Recently started on Nexium which seemed to help in the beginning but she still having symptoms. Has been advised by Dr. Lin Landsman to get EGD performed.  She has nausea but no vomiting.  Occasional chest pains especially after eating with associated cough.  No fever chills or night sweats.  No jaundice dark urine or pale stools.  She denies having any significant diarrhea or constipation.  Had epigastric discomfort which is somewhat better now.  Has been using significant nonsteroidals for right knee pain and back pain.  H/O shingles 3 weeks ago.  Better now.  Took Zovirax.  Colon 04/2017 ([facility])- 6 mm s/p polypectomy. Bx- TA.  Rpt colon in 29yrs.  Has history of sinus problems - didn't want to have Sx-seen by Dr West/Dr Gaylyn Cheers in the past.    Past Medical History:  Diagnosis Date  . Allergic rhinitis   . Arthritis   . Blood transfusion without reported diagnosis   . COPD (chronic obstructive pulmonary disease) ([place])   . GERD (gastroesophageal reflux disease)   . History of colon polyps     . HTN (hypertension)   . Hyperlipidemia   . Pancreatitis   . Pneumonia   . Psychotic depression ([place])     Past Surgical History:  Procedure Laterality Date  . ABDOMINAL HYSTERECTOMY    . CESAREAN SECTION    . COLONOSCOPY  05/02/2017   Colonic polyp status post polypectomy. Small internal hemorrhoids  . GALLBLADDER SURGERY    . REPLACEMENT TOTAL KNEE Right 02/09/2019  . TUBAL LIGATION      Family History  Problem Relation Age of Onset  . COPD Father   . Colon cancer Cousin        mother's cousin  . Breast cancer Maternal Aunt        great aunt    Social History   Tobacco Use  . Smoking status: Current Every Day Smoker    Types: Cigarettes  . Smokeless tobacco: Never Used  . Tobacco comment: smokes a pack and a half daily 11.25.19  Substance Use Topics  . Alcohol use: Not Currently  . Drug use: No    Current Outpatient Medications  Medication Sig Dispense Refill  . albuterol (PROVENTIL) (2.5 MG/3ML) 0.083% nebulizer solution as needed.   6  . albuterol (VENTOLIN HFA) 108 (90 Base) MCG/ACT inhaler Inhale 2 puffs into the lungs every 6 (six) hours as needed for wheezing or shortness of breath. 8 g 5  . amLODipine (NORVASC) 10  MG tablet Take 10 mg by mouth daily.    Marland Kitchen atorvastatin (LIPITOR) 20 MG tablet Take 20 mg by mouth daily.    . Carboxymethylcellulose Sodium (THERATEARS) 0.25 % SOLN Apply to eye as needed.    . cetirizine (ZYRTEC) 10 MG tablet Take 10 mg by mouth daily.    . Esomeprazole Magnesium (NEXIUM PO) Take 1 tablet by mouth daily.    . mometasone (NASONEX) 50 MCG/ACT nasal spray mometasone 50 mcg/actuation nasal spray    . montelukast (SINGULAIR) 10 MG tablet Take 10 mg by mouth daily.    . Multiple Vitamins-Minerals (50+ ADULT EYE HEALTH PO) Take 1 tablet by mouth daily.    Marland Kitchen topiramate (TOPAMAX) 50 MG tablet Take 50 mg by mouth daily.   0  . ALPRAZolam (XANAX) 0.5 MG tablet alprazolam 0.5 mg tablet  take 1 tablet by mouth twice a day if needed    .  dicyclomine (BENTYL) 10 MG capsule dicyclomine 10 mg capsule     No current facility-administered medications for this visit.    Allergies  Allergen Reactions  . Codeine   . Levaquin [Levofloxacin In D5w]   . Tape Hives    Review of Systems:  Constitutional: Denies fever, chills, diaphoresis, appetite change and fatigue.  HEENT: Denies photophobia, eye pain, redness.  Has sinus problems.Marland Kitchen   Respiratory: Denies SOB, DOE, cough, chest tightness,  and wheezing.   Cardiovascular: Denies chest pain, palpitations and leg swelling.  Genitourinary: Denies dysuria, urgency, frequency, hematuria, flank pain and difficulty urinating.  Musculoskeletal: Denies myalgias, has back pain, joint swelling, arthralgias and gait problem.  Skin: No rash.  Neurological: Denies dizziness, seizures, syncope, weakness, light-headedness, numbness and headaches.  Hematological: Denies adenopathy. Easy bruising, personal or family bleeding history  Psychiatric/Behavioral: Has anxiety or depression     Physical Exam:    BP 124/84   Pulse (!) 109   Temp 98.2 F (36.8 C)   Ht 5\' 7"  (1.702 m)   Wt 189 lb 4 oz (85.8 kg)   BMI 29.64 kg/m  Wt Readings from Last 3 Encounters:  12/06/19 189 lb 4 oz (85.8 kg)  08/14/18 192 lb (87.1 kg)  09/02/17 185 lb (83.9 kg)   Constitutional:  Well-developed, in no acute distress. Psychiatric: Normal mood and affect. Behavior is normal. HEENT: Pupils normal.  Conjunctivae are normal. No scleral icterus. Neck supple.  Cardiovascular: Normal rate, regular rhythm. No edema Pulmonary/chest: Effort normal and breath sounds normal. No wheezing, rales or rhonchi. Abdominal: Soft, nondistended.  Epigastric tenderness. bowel sounds active throughout. There are no masses palpable. No hepatomegaly. Rectal:  defered Neurological: Alert and oriented to person place and time. Skin: Skin is warm and dry. No rashes noted.  Data Reviewed: I have personally reviewed following labs  and imaging studies  CBC: CBC Latest Ref Rng & Units 08/02/2009 08/01/2009 07/31/2009  WBC 4.0 - 10.5 K/uL 9.8 - 8.4  Hemoglobin 12.0 - 15.0 g/dL 12.6 13.9 14.5  Hematocrit 36.0 - 46.0 % 36.9 40.3 43.3  Platelets 150 - 400 K/uL 236 - 253    CMP: CMP Latest Ref Rng & Units 07/31/2009  Glucose 70 - 99 mg/dL 94  BUN 6 - 23 mg/dL 9  Creatinine 0.4 - 1.2 mg/dL 0.56  Sodium 135 - 145 mEq/L 136  Potassium 3.5 - 5.1 mEq/L 3.3(L)  Chloride 96 - 112 mEq/L 103  CO2 19 - 32 mEq/L 29  Calcium 8.4 - 10.5 mg/dL 9.2      Carmell Austria, MD  12/06/2019, 10:19 AM  Cc: Myer Peer, MD

## 2019-12-06 NOTE — Patient Instructions (Signed)
If you are age 55 or older, your body mass index should be between 23-30. Your Body mass index is 29.64 kg/m. If this is out of the aforementioned range listed, please consider follow up with your Primary Care Provider.  If you are age 2 or younger, your body mass index should be between 19-25. Your Body mass index is 29.64 kg/m. If this is out of the aformentioned range listed, please consider follow up with your Primary Care Provider.   You have been scheduled for an endoscopy. Please follow written instructions given to you at your visit today. If you use inhalers (even only as needed), please bring them with you on the day of your procedure. Your physician has requested that you go to www.startemmi.com and enter the access code given to you at your visit today. This web site gives a general overview about your procedure. However, you should still follow specific instructions given to you by our office regarding your preparation for the procedure.  We have sent the following medications to your pharmacy for you to pick up at your convenience: Protonix   Thank you,  Dr. Jackquline Denmark

## 2019-12-07 DIAGNOSIS — Z1159 Encounter for screening for other viral diseases: Secondary | ICD-10-CM | POA: Diagnosis not present

## 2019-12-08 LAB — SARS CORONAVIRUS 2 (TAT 6-24 HRS): SARS Coronavirus 2: NEGATIVE

## 2019-12-10 DIAGNOSIS — M9903 Segmental and somatic dysfunction of lumbar region: Secondary | ICD-10-CM | POA: Diagnosis not present

## 2019-12-10 DIAGNOSIS — M9905 Segmental and somatic dysfunction of pelvic region: Secondary | ICD-10-CM | POA: Diagnosis not present

## 2019-12-10 DIAGNOSIS — M9901 Segmental and somatic dysfunction of cervical region: Secondary | ICD-10-CM | POA: Diagnosis not present

## 2019-12-11 ENCOUNTER — Other Ambulatory Visit: Payer: Self-pay

## 2019-12-11 ENCOUNTER — Ambulatory Visit (AMBULATORY_SURGERY_CENTER): Payer: BC Managed Care – PPO | Admitting: Gastroenterology

## 2019-12-11 ENCOUNTER — Other Ambulatory Visit (HOSPITAL_COMMUNITY)
Admission: RE | Admit: 2019-12-11 | Discharge: 2019-12-11 | Disposition: A | Discharge status: Home / Self Care | Payer: BC Managed Care – PPO | Source: Ambulatory Visit | Attending: Gastroenterology | Admitting: Gastroenterology

## 2019-12-11 ENCOUNTER — Encounter: Payer: Self-pay | Admitting: Gastroenterology

## 2019-12-11 VITALS — BP 108/75 | HR 76 | Temp 97.3°F | Resp 16 | Ht 67.0 in | Wt 189.0 lb

## 2019-12-11 DIAGNOSIS — K208 Other esophagitis without bleeding: Secondary | ICD-10-CM | POA: Diagnosis not present

## 2019-12-11 DIAGNOSIS — K2950 Unspecified chronic gastritis without bleeding: Secondary | ICD-10-CM

## 2019-12-11 DIAGNOSIS — K219 Gastro-esophageal reflux disease without esophagitis: Secondary | ICD-10-CM | POA: Insufficient documentation

## 2019-12-11 DIAGNOSIS — B49 Unspecified mycosis: Secondary | ICD-10-CM | POA: Diagnosis not present

## 2019-12-11 DIAGNOSIS — R1310 Dysphagia, unspecified: Secondary | ICD-10-CM | POA: Diagnosis not present

## 2019-12-11 MED ORDER — SODIUM CHLORIDE 0.9 % IV SOLN: 500.0000 mL | Freq: Once | INTRAVENOUS | Status: DC

## 2019-12-11 MED ORDER — FLUCONAZOLE 100 MG PO TABS: ORAL_TABLET | ORAL | 0 refills | Status: DC

## 2019-12-11 NOTE — Progress Notes (Signed)
1455- pt c/o mid abdominal cramping, rating as a 4.  Is passing air.  I encouraged her to pass air and belch if needed  1505- pt states, "I just passed some air and I'm feeling better"  1513- Dr. Lyndel Safe at bedside  1525- pt to bathroom- states she passed air and feels better  Pt told to pick up her Diflucan prescription at her [facility] in [place]

## 2019-12-11 NOTE — Op Note (Signed)
[place] Patient Name: [name] Procedure Date: 12/11/2019 2:36 PM MRN: [id number] Endoscopist: Jackquline Denmark , MD Age: 54 Referring MD:  Date of Birth: 1966-06-10 Gender: Female Account #: [id number] Procedure:                Upper GI endoscopy Indications:              Dysphagia with globus sensation. Medicines:                Monitored Anesthesia Care Procedure:                Pre-Anesthesia Assessment:                           - Prior to the procedure, a History and Physical                            was performed, and patient medications and                            allergies were reviewed. The patient's tolerance of                            previous anesthesia was also reviewed. The risks                            and benefits of the procedure and the sedation                            options and risks were discussed with the patient.                            All questions were answered, and informed consent                            was obtained. Prior Anticoagulants: The patient has                            taken no previous anticoagulant or antiplatelet                            agents. ASA Grade Assessment: II - A patient with                            mild systemic disease. After reviewing the risks                            and benefits, the patient was deemed in                            satisfactory condition to undergo the procedure.                           After obtaining informed consent, the endoscope was  passed under direct vision. Throughout the                            procedure, the patient's blood pressure, pulse, and                            oxygen saturations were monitored continuously. The                            Endoscope was introduced through the mouth, and                            advanced to the second part of duodenum. The upper                            GI endoscopy was  accomplished without difficulty.                            The patient tolerated the procedure well. Scope In: Scope Out: Findings:                 Diffuse, yellow plaques were found in the entire                            esophagus. Z-line at 40 cm. Brushings were obtained                            followed by biopsies for histology.                           The entire examined stomach was normal. Biopsies                            were taken with a cold forceps for histology.                           The examined duodenum was normal. Complications:            No immediate complications. Estimated Blood Loss:     Estimated blood loss: none. Impression:               - Esophageal plaques were found, consistent with                            candidiasis. (brushed and biopsied). Recommendation:           - Resume previous diet.                           - Diflucan (fluconazole) 400 mg po x1, then 200 mg                            PO daily for 2 weeks.                           - Return to GI clinic in 12  weeks.                           - Continue present medications.                           - As per prev note, she is not using any steroid                            inhalers. If she is, she would rinse her mouth with                            warm water after inhalers.                           - D/W Tammy. Jackquline Denmark, MD 12/11/2019 3:00:01 PM This report has been signed electronically.

## 2019-12-11 NOTE — Progress Notes (Signed)
Called to room to assist during endoscopic procedure.  Patient ID and intended procedure confirmed with present staff. Received instructions for my participation in the procedure from the performing physician.  

## 2019-12-11 NOTE — Patient Instructions (Signed)
YOU HAD AN ENDOSCOPIC PROCEDURE TODAY AT [facility]:   Refer to the procedure report that was given to you for any specific questions about what was found during the examination.  If the procedure report does not answer your questions, please call your gastroenterologist to clarify.  If you requested that your care partner not be given the details of your procedure findings, then the procedure report has been included in a sealed envelope for you to review at your convenience later.  YOU SHOULD EXPECT: Some feelings of bloating in the abdomen. Passage of more gas than usual.  Walking can help get rid of the air that was put into your GI tract during the procedure and reduce the bloating.   Please Note:  You might notice some irritation and congestion in your nose or some drainage.  This is from the oxygen used during your procedure.  There is no need for concern and it should clear up in a day or so.  SYMPTOMS TO REPORT IMMEDIATELY:   Following upper endoscopy (EGD)  Vomiting of blood or coffee ground material  New chest pain or pain under the shoulder blades  Painful or persistently difficult swallowing  New shortness of breath  Fever of 100F or higher  Black, tarry-looking stools  For urgent or emergent issues, a gastroenterologist can be reached at any hour by calling [phone]. Do not use MyChart messaging for urgent concerns.    DIET:  We do recommend a small meal at first, but then you may proceed to your regular diet.  Drink plenty of fluids but you should avoid alcoholic beverages for 24 hours.  ACTIVITY:  You should plan to take it easy for the rest of today and you should NOT DRIVE or use heavy machinery until tomorrow (because of the sedation medicines used during the test).    FOLLOW UP: Our staff will call the number listed on your records 48-72 hours following your procedure to check on you and address any questions or concerns that you may have  regarding the information given to you following your procedure. If we do not reach you, we will leave a message.  We will attempt to reach you two times.  During this call, we will ask if you have developed any symptoms of COVID 19. If you develop any symptoms (ie: fever, flu-like symptoms, shortness of breath, cough etc.) before then, please call [phone].  If you test positive for Covid 19 in the 2 weeks post procedure, please call and report this information to [place].    If any biopsies were taken you will be contacted by phone or by letter within the next 1-3 weeks.  Please call us at [phone] if you have not heard about the biopsies in 3 weeks.    SIGNATURES/CONFIDENTIALITY: You and/or your care partner have signed paperwork which will be entered into your electronic medical record.  These signatures attest to the fact that that the information above on your After Visit Summary has been reviewed and is understood.  Full responsibility of the confidentiality of this discharge information lies with you and/or your care-partner.  Take Diflucan- 400 mg today, then 200 mg daily for 2 weeks  Continue normal medications  Dr. Steve Rattler office nurse will call you to set up a follow up office visit for 12 weeks

## 2019-12-11 NOTE — Progress Notes (Signed)
Pt's states no medical or surgical changes since previsit or office visit.  Temp- June Vitals- Donna 

## 2019-12-11 NOTE — Progress Notes (Signed)
Report to PACU, RN, vss, BBS= Clear.  

## 2019-12-12 ENCOUNTER — Ambulatory Visit: Payer: 59 | Admitting: Family Medicine

## 2019-12-12 LAB — CYTOLOGY - NON PAP

## 2019-12-13 ENCOUNTER — Telehealth: Payer: Self-pay

## 2019-12-13 NOTE — Telephone Encounter (Signed)
  Follow up Call-  Call back number 12/11/2019  Post procedure Call Back phone  # [phone]  Permission to leave phone message Yes  Some recent data might be hidden     Patient questions:  Do you have a fever, pain , or abdominal swelling? No. Pain Score  0 *  Have you tolerated food without any problems? Yes.    Have you been able to return to your normal activities? Yes.    Do you have any questions about your discharge instructions: Diet   No. Medications  No. Follow up visit  No.  Do you have questions or concerns about your Care? No.  Actions: * If pain score is 4 or above: No action needed, pain <4.    1. Have you developed a fever since your procedure? No  2.   Have you had an respiratory symptoms (SOB or cough) since your procedure? No  3.   Have you tested positive for COVID 19 since your procedure No  4.   Have you had any family members/close contacts diagnosed with the COVID 19 since your procedure?  No   If yes to any of these questions please route to Joylene John, RN and Erenest Rasher, RN

## 2019-12-18 ENCOUNTER — Encounter: Payer: Self-pay | Admitting: Gastroenterology

## 2019-12-19 DIAGNOSIS — R109 Unspecified abdominal pain: Secondary | ICD-10-CM | POA: Diagnosis not present

## 2019-12-20 ENCOUNTER — Telehealth: Payer: Self-pay | Admitting: Gastroenterology

## 2019-12-20 DIAGNOSIS — R1310 Dysphagia, unspecified: Secondary | ICD-10-CM

## 2019-12-20 DIAGNOSIS — R198 Other specified symptoms and signs involving the digestive system and abdomen: Secondary | ICD-10-CM

## 2019-12-20 DIAGNOSIS — R1319 Other dysphagia: Secondary | ICD-10-CM

## 2019-12-20 DIAGNOSIS — R0989 Other specified symptoms and signs involving the circulatory and respiratory systems: Secondary | ICD-10-CM

## 2019-12-20 DIAGNOSIS — K219 Gastro-esophageal reflux disease without esophagitis: Secondary | ICD-10-CM

## 2019-12-20 MED ORDER — SUCRALFATE 1 G PO TABS: 1.0000 g | ORAL_TABLET | Freq: Three times a day (TID) | ORAL | 0 refills | Status: DC

## 2019-12-20 NOTE — Telephone Encounter (Signed)
Dr. Steve Rattler patient and he is working today. Forwarding message to him.

## 2019-12-20 NOTE — Telephone Encounter (Signed)
Called and spoke with patient-patient informed of MD recommendations; patient is agreeable with plan of care and verified pharmacy; RX sent in; Patient verbalized understanding of information/instructions;  Patient was advised to call the office at [phone] if questions/concerns arise;

## 2019-12-20 NOTE — Telephone Encounter (Signed)
DOD=Dr. Lyndel Safe pt=Called and spoke with patient-patient reports she has tried Miralax for her constipation and has had improvement "lots of stool has come out";   patient requesting to have a Carafate RX be sent in at this time so she "can have a medication to help my stomach";  Patient reports she is taking the Diflucan for the findings from her EGD on 12/11/2019;   Please advise

## 2019-12-20 NOTE — Telephone Encounter (Signed)
Please review message from patient and advise

## 2019-12-20 NOTE — Telephone Encounter (Signed)
I am glad she is feeling better with MiraLAX. Can call in Carafate tablets 1 g p.o. QID x 2 weeks.  No refills  Thx  RG

## 2019-12-25 NOTE — Telephone Encounter (Signed)
Spoke with pt and let her know that carafate can be constipating and that she can continue taking miralax as needed.

## 2019-12-25 NOTE — Telephone Encounter (Signed)
Pt reported that since her EGD 12/11/19 she has been constipated.  Please advise.

## 2019-12-31 NOTE — Progress Notes (Signed)
New Patient Office Visit  Subjective:  Patient ID: [name], female    DOB: 12-Dec-1965  Age: 54 y.o. MRN: [id number]  CC:  Chief Complaint  Patient presents with  . Hyperlipidemia  . Gastroesophageal Reflux    HPI [name] presents as a new patient.  She was referred by her mother, [name], who also sees me.  Patient has hyperlipidemia which is treated with atorvastatin 20 mg once daily.  In addition she has been diagnosed with coronary vasospasms which are treated with amlodipine 10 mg once daily.  The patient has had a difficult last 2 months.  She was having significant issues with epigastric pain and swallowing and underwent endoscopies with Dr. Lyndel Rich.  She was found to have esophageal candidiasis and was treated.  She has improved.  She is also taking pantoprazole 40 mg.  In addition the patient had kidney stones in 12-29-19.  She has passed these.  She also had shingles recently.  This has resolved.  She also fractured her right humerus in January 2021.  This was not treated with surgery but rather a sling, likely weighted.  She has had a significant increase in her anxiety due to all of the above issues.  She previously was given Xanax 0.25 mg 1-2 times daily by Dr. Lin Rich.  She is requesting this again.  She does have a history of being on Prozac or Paxil many years ago, but is unsure if this had helped.   She also reports having asthma and COPD.  She is currently a smoker.  She takes ProAir 2 puffs 4 times a day as needed.  She does not need this very frequently.  She was taking breztri but discontinued it when she felt that there was steroids in it.  She was current concerned this is contributed to her esophageal candidiasis.  Patient also suffers from allergic rhinitis.  She is currently on Singulair 10 mg once daily.   Past Medical History:  Diagnosis Date  . Allergic rhinitis   . Arthritis   . Blood transfusion without reported diagnosis   . COPD  (chronic obstructive pulmonary disease) ([place])   . GERD (gastroesophageal reflux disease)   . History of colon polyps   . HTN (hypertension)   . Hyperlipidemia   . Kidney stones   . Pancreatitis   . Pneumonia   . Psychotic depression ([place])   . Shingles February 26th, 2001    Past Surgical History:  Procedure Laterality Date  . ABDOMINAL HYSTERECTOMY    . CESAREAN SECTION    . COLONOSCOPY  05/02/2017   Colonic polyp status post polypectomy. Small internal hemorrhoids  . GALLBLADDER SURGERY    . REPLACEMENT TOTAL KNEE Right 02/09/2019  . TOTAL KNEE ARTHROPLASTY  02/09/2019  . TUBAL LIGATION      Family History  Problem Relation Age of Onset  . COPD Mother   . Parkinson's disease Mother   . COPD Father   . Arthritis Father   . Lung disease Father   . Colon cancer Cousin        mother's cousin  . Breast cancer Maternal Aunt        great aunt  . Diabetes Other   . Stroke Other   . Hypertension Other   . Hyperlipidemia Other   . Asthma Other   . Heart failure Other   . Thyroid disease Other   . Heart attack Other   . COPD Other   .  Arrhythmia Other   . Arthritis Other   . Migraines Other     Social History   Socioeconomic History  . Marital status: Widowed    Spouse name: Not on file  . Number of children: 2  . Years of education: Not on file  . Highest education level: Not on file  Occupational History  . Not on file  Tobacco Use  . Smoking status: Current Every Day Smoker    Packs/day: 1.00    Types: Cigarettes  . Smokeless tobacco: Never Used  . Tobacco comment: smokes a pack and a half daily 11.25.19  Substance and Sexual Activity  . Alcohol use: Yes  . Drug use: No  . Sexual activity: Not on file  Other Topics Concern  . Not on file  Social History Narrative  . Not on file   Social Determinants of Health   Financial Resource Strain:   . Difficulty of Paying Living Expenses:   Food Insecurity:   . Worried About Charity fundraiser in the  Last Year:   . Arboriculturist in the Last Year:   Transportation Needs:   . Film/video editor (Medical):   Marland Kitchen Lack of Transportation (Non-Medical):   Physical Activity:   . Days of Exercise per Week:   . Minutes of Exercise per Session:   Stress:   . Feeling of Stress :   Social Connections:   . Frequency of Communication with Friends and Family:   . Frequency of Social Gatherings with Friends and Family:   . Attends Religious Services:   . Active Member of Clubs or Organizations:   . Attends Archivist Meetings:   Marland Kitchen Marital Status:   Intimate Partner Violence:   . Fear of Current or Ex-Partner:   . Emotionally Abused:   Marland Kitchen Physically Abused:   . Sexually Abused:     ROS Review of Systems  Constitutional: Positive for fatigue. Negative for chills and fever.  HENT: Positive for ear pain. Negative for congestion, rhinorrhea and sore throat.        Rt ear aches a little.  Respiratory: Negative for cough and shortness of breath.   Cardiovascular: Negative for chest pain.  Gastrointestinal: Positive for abdominal pain and diarrhea. Negative for constipation, nausea and vomiting.       Epigastric discomfort. Pt had esophagitis with candidiasis - treated by Dr. Lyndel Rich.   Endocrine: Positive for polydipsia.  Genitourinary: Negative for dysuria and urgency.  Musculoskeletal: Positive for arthralgias. Negative for back pain and myalgias (muscle weakness).  Neurological: Negative for dizziness, weakness, light-headedness and headaches.  Hematological: Bruises/bleeds easily.  Psychiatric/Behavioral: Positive for dysphoric mood. Negative for suicidal ideas. The patient is nervous/anxious.     Objective:   Today's Vitals: BP 118/70 (BP Location: Left Arm, Patient Position: Sitting)   Pulse 80   Temp (!) 97.5 F (36.4 C) (Temporal)   Ht 5\' 11"  (1.803 m)   Wt 185 lb (83.9 kg)   SpO2 98%   BMI 25.80 kg/m   Physical Exam Constitutional:      Appearance: Normal  appearance.  HENT:     Nose: Nose normal.     Mouth/Throat:     Mouth: Mucous membranes are moist.  Cardiovascular:     Rate and Rhythm: Normal rate and regular rhythm.  Pulmonary:     Effort: Pulmonary effort is normal.     Breath sounds: Normal breath sounds.  Abdominal:     General: Bowel sounds are  normal.     Palpations: Abdomen is soft.  Musculoskeletal:        General: Normal range of motion.     Cervical back: Normal range of motion.  Skin:    Findings: Bruising present.  Neurological:     Mental Status: She is alert.  Psychiatric:        Behavior: Behavior normal.     Comments: Anxious.     Assessment & Plan:  1. Left lower quadrant abdominal pain Check labs.  - Urinalysis Dipstick  2. Other fatigue Check labs. - CBC with Differential/Platelet - Comprehensive metabolic panel - TSH  3. Spontaneous ecchymosis Check labs. - Protime-INR  4. Cigarette nicotine dependence with nicotine-induced disorder Discussed importance of quitting, including worsening breathing, heart disease, and strokes. Patient is not ready at this time.  5. Mixed hyperlipidemia Continue lipitor. Continue low fat diet. Recommend exercise.  6. GAD (generalized anxiety disorder) Gave rx for xanax 0.25 mg once daily as needed for severe anxiety. # 10/0. Refused more than this until I reviewed Dr.Reddings notes.   7. Prinzmetal angina (HCC) Continue amlodipine.  8. Simple Chronic Bronchitis. For now just continue proair. If using numerous times per day will start back on a combined prophylactic medicine. Recommend quit smoking.   Outpatient Encounter Medications as of 01/01/2020  Medication Sig  . albuterol (PROVENTIL) (2.5 MG/3ML) 0.083% nebulizer solution as needed.   Marland Kitchen albuterol (VENTOLIN HFA) 108 (90 Base) MCG/ACT inhaler Inhale 2 puffs into the lungs every 6 (six) hours as needed for wheezing or shortness of breath.  Marland Kitchen amLODipine (NORVASC) 10 MG tablet Take 1 tablet (10 mg total)  by mouth daily.  Marland Kitchen aspirin 325 MG EC tablet Take by mouth.  Marland Kitchen atorvastatin (LIPITOR) 20 MG tablet Take 1 tablet (20 mg total) by mouth daily.  Marland Kitchen dicyclomine (BENTYL) 10 MG capsule dicyclomine 10 mg capsule  . montelukast (SINGULAIR) 10 MG tablet Take 1 tablet (10 mg total) by mouth daily.  . Multiple Vitamins-Minerals (50+ ADULT EYE HEALTH PO) Take 1 tablet by mouth daily.  . nitroGLYCERIN (NITROSTAT) 0.4 MG SL tablet nitroglycerin 0.4 mg sublingual tablet  DISSOLVE TNT Q 5 MINUTES  . pantoprazole (PROTONIX) 40 MG tablet Take 1 tablet (40 mg total) by mouth 2 (two) times daily.  . sucralfate (CARAFATE) 1 g tablet Take 1 tablet (1 g total) by mouth 4 (four) times daily -  with meals and at bedtime for 14 days.  Marland Kitchen topiramate (TOPAMAX) 50 MG tablet Take 50 mg by mouth daily.   . [DISCONTINUED] amLODipine (NORVASC) 10 MG tablet Take 10 mg by mouth daily.  . [DISCONTINUED] atorvastatin (LIPITOR) 20 MG tablet Take 20 mg by mouth daily.  . [DISCONTINUED] Budeson-Glycopyrrol-Formoterol (BREZTRI AEROSPHERE) 160-9-4.8 MCG/ACT AERO INHALE 2 PUFFS BY MOUTH TWICE DAILY  . [DISCONTINUED] montelukast (SINGULAIR) 10 MG tablet Take 10 mg by mouth daily.  . cyclobenzaprine (FLEXERIL) 5 MG tablet Take 1 tablet (5 mg total) by mouth 3 (three) times daily as needed for muscle spasms.  Marland Kitchen ketorolac (TORADOL) 10 MG tablet ketorolac 10 mg tablet  TAKE 1 TABLET BY MOUTH TWICE DAILY  . Magnesium 500 MG CAPS Take by mouth.  . metaxalone (SKELAXIN) 800 MG tablet metaxalone 800 mg tablet  TAKE 1 TABLET BY MOUTH TWICE DAILY  . vitamin E 180 MG (400 UNITS) capsule Take by mouth.  . zinc gluconate 50 MG tablet Take by mouth.  . [DISCONTINUED] ALPRAZolam (XANAX) 0.25 MG tablet Take 1 tablet (0.25 mg total) by  mouth daily as needed for anxiety.  . [DISCONTINUED] ALPRAZolam (XANAX) 0.5 MG tablet alprazolam 0.5 mg tablet  take 1 tablet by mouth twice a day if needed  . [DISCONTINUED] Carboxymethylcellulose Sodium (THERATEARS)  0.25 % SOLN Apply to eye as needed.  . [DISCONTINUED] cetirizine (ZYRTEC) 10 MG tablet Take 10 mg by mouth daily.  . [DISCONTINUED] Esomeprazole Magnesium (NEXIUM PO) Take 1 tablet by mouth daily.  . [DISCONTINUED] fluconazole (DIFLUCAN) 100 MG tablet 400 mg oral x1 dose, then 200 mg daily for 2 weeks  . [DISCONTINUED] mometasone (NASONEX) 50 MCG/ACT nasal spray mometasone 50 mcg/actuation nasal spray   No facility-administered encounter medications on file as of 01/01/2020.    Follow-up: No follow-ups on file. Await labs.  Rochel Brome, MD

## 2020-01-01 ENCOUNTER — Encounter: Payer: Self-pay | Admitting: Family Medicine

## 2020-01-01 ENCOUNTER — Ambulatory Visit: Payer: BC Managed Care – PPO | Admitting: Family Medicine

## 2020-01-01 ENCOUNTER — Other Ambulatory Visit: Payer: Self-pay

## 2020-01-01 VITALS — BP 118/70 | HR 80 | Temp 97.5°F | Ht 71.0 in | Wt 185.0 lb

## 2020-01-01 DIAGNOSIS — R1032 Left lower quadrant pain: Secondary | ICD-10-CM | POA: Diagnosis not present

## 2020-01-01 DIAGNOSIS — R233 Spontaneous ecchymoses: Secondary | ICD-10-CM

## 2020-01-01 DIAGNOSIS — F17219 Nicotine dependence, cigarettes, with unspecified nicotine-induced disorders: Secondary | ICD-10-CM | POA: Diagnosis not present

## 2020-01-01 DIAGNOSIS — I201 Angina pectoris with documented spasm: Secondary | ICD-10-CM

## 2020-01-01 DIAGNOSIS — E782 Mixed hyperlipidemia: Secondary | ICD-10-CM | POA: Insufficient documentation

## 2020-01-01 DIAGNOSIS — J410 Simple chronic bronchitis: Secondary | ICD-10-CM

## 2020-01-01 DIAGNOSIS — R5383 Other fatigue: Secondary | ICD-10-CM

## 2020-01-01 DIAGNOSIS — F411 Generalized anxiety disorder: Secondary | ICD-10-CM

## 2020-01-01 LAB — POCT URINALYSIS DIPSTICK
Bilirubin, UA: NEGATIVE
Blood, UA: NEGATIVE
Glucose, UA: NEGATIVE
Ketones, UA: NEGATIVE
Leukocytes, UA: NEGATIVE
Nitrite, UA: NEGATIVE
Protein, UA: NEGATIVE
Spec Grav, UA: 1.01 (ref 1.010–1.025)
Urobilinogen, UA: 0.2 E.U./dL
pH, UA: 8 (ref 5.0–8.0)

## 2020-01-01 MED ORDER — ALPRAZOLAM 0.25 MG PO TABS: 0.2500 mg | ORAL_TABLET | Freq: Every day | ORAL | 0 refills | Status: DC | PRN

## 2020-01-01 MED ORDER — ATORVASTATIN CALCIUM 20 MG PO TABS: 20.0000 mg | ORAL_TABLET | Freq: Every day | ORAL | 0 refills | Status: DC

## 2020-01-01 MED ORDER — AMLODIPINE BESYLATE 10 MG PO TABS: 10.0000 mg | ORAL_TABLET | Freq: Every day | ORAL | 1 refills | Status: DC

## 2020-01-01 MED ORDER — MONTELUKAST SODIUM 10 MG PO TABS: 10.0000 mg | ORAL_TABLET | Freq: Every day | ORAL | 3 refills | Status: DC

## 2020-01-02 ENCOUNTER — Other Ambulatory Visit: Payer: Self-pay | Admitting: Family Medicine

## 2020-01-02 LAB — CBC WITH DIFFERENTIAL/PLATELET
Basophils Absolute: 0 10*3/uL (ref 0.0–0.2)
Basos: 1 %
EOS (ABSOLUTE): 0.3 10*3/uL (ref 0.0–0.4)
Eos: 4 %
Hematocrit: 41.1 % (ref 34.0–46.6)
Hemoglobin: 14.4 g/dL (ref 11.1–15.9)
Immature Grans (Abs): 0 10*3/uL (ref 0.0–0.1)
Immature Granulocytes: 0 %
Lymphocytes Absolute: 2.3 10*3/uL (ref 0.7–3.1)
Lymphs: 27 %
MCH: 32.9 pg (ref 26.6–33.0)
MCHC: 35 g/dL (ref 31.5–35.7)
MCV: 94 fL (ref 79–97)
Monocytes Absolute: 0.5 10*3/uL (ref 0.1–0.9)
Monocytes: 6 %
Neutrophils Absolute: 5.2 10*3/uL (ref 1.4–7.0)
Neutrophils: 62 %
Platelets: 298 10*3/uL (ref 150–450)
RBC: 4.38 x10E6/uL (ref 3.77–5.28)
RDW: 11.8 % (ref 11.7–15.4)
WBC: 8.4 10*3/uL (ref 3.4–10.8)

## 2020-01-02 LAB — COMPREHENSIVE METABOLIC PANEL
ALT: 21 IU/L (ref 0–32)
AST: 15 IU/L (ref 0–40)
Albumin/Globulin Ratio: 2.3 — ABNORMAL HIGH (ref 1.2–2.2)
Albumin: 4.6 g/dL (ref 3.8–4.9)
Alkaline Phosphatase: 102 IU/L (ref 39–117)
BUN/Creatinine Ratio: 17 (ref 9–23)
BUN: 9 mg/dL (ref 6–24)
Bilirubin Total: <0.2 mg/dL (ref 0.0–1.2)
CO2: 24 mmol/L (ref 20–29)
Calcium: 9.7 mg/dL (ref 8.7–10.2)
Chloride: 105 mmol/L (ref 96–106)
Creatinine, Ser: 0.54 mg/dL — ABNORMAL LOW (ref 0.57–1.00)
GFR calc Af Amer: 124 mL/min/{1.73_m2} (ref >59)
GFR calc non Af Amer: 107 mL/min/{1.73_m2} (ref >59)
Globulin, Total: 2 g/dL (ref 1.5–4.5)
Glucose: 103 mg/dL — ABNORMAL HIGH (ref 65–99)
Potassium: 4 mmol/L (ref 3.5–5.2)
Sodium: 142 mmol/L (ref 134–144)
Total Protein: 6.6 g/dL (ref 6.0–8.5)

## 2020-01-02 LAB — PROTIME-INR
INR: 0.9 (ref 0.9–1.2)
Prothrombin Time: 9.8 s (ref 9.1–12.0)

## 2020-01-02 LAB — TSH: TSH: 2.9 u[IU]/mL (ref 0.450–4.500)

## 2020-01-02 MED ORDER — ALPRAZOLAM 0.25 MG PO TABS: 0.2500 mg | ORAL_TABLET | Freq: Every day | ORAL | 0 refills | Status: DC | PRN

## 2020-01-02 MED ORDER — CYCLOBENZAPRINE HCL 5 MG PO TABS: 5.0000 mg | ORAL_TABLET | Freq: Three times a day (TID) | ORAL | 0 refills | Status: DC | PRN

## 2020-01-04 DIAGNOSIS — S42294A Other nondisplaced fracture of upper end of right humerus, initial encounter for closed fracture: Secondary | ICD-10-CM | POA: Diagnosis not present

## 2020-01-06 ENCOUNTER — Encounter: Payer: Self-pay | Admitting: Family Medicine

## 2020-01-06 DIAGNOSIS — J410 Simple chronic bronchitis: Secondary | ICD-10-CM | POA: Insufficient documentation

## 2020-01-10 DIAGNOSIS — M25511 Pain in right shoulder: Secondary | ICD-10-CM | POA: Diagnosis not present

## 2020-01-10 DIAGNOSIS — M25611 Stiffness of right shoulder, not elsewhere classified: Secondary | ICD-10-CM | POA: Diagnosis not present

## 2020-01-21 DIAGNOSIS — M9901 Segmental and somatic dysfunction of cervical region: Secondary | ICD-10-CM | POA: Diagnosis not present

## 2020-01-21 DIAGNOSIS — M9905 Segmental and somatic dysfunction of pelvic region: Secondary | ICD-10-CM | POA: Diagnosis not present

## 2020-01-21 DIAGNOSIS — M9903 Segmental and somatic dysfunction of lumbar region: Secondary | ICD-10-CM | POA: Diagnosis not present

## 2020-03-12 DIAGNOSIS — Z471 Aftercare following joint replacement surgery: Secondary | ICD-10-CM | POA: Diagnosis not present

## 2020-03-12 DIAGNOSIS — Z96651 Presence of right artificial knee joint: Secondary | ICD-10-CM | POA: Diagnosis not present

## 2020-03-13 ENCOUNTER — Other Ambulatory Visit: Payer: Self-pay | Admitting: Family Medicine

## 2020-03-18 DIAGNOSIS — M5416 Radiculopathy, lumbar region: Secondary | ICD-10-CM | POA: Diagnosis not present

## 2020-03-18 DIAGNOSIS — M9902 Segmental and somatic dysfunction of thoracic region: Secondary | ICD-10-CM | POA: Diagnosis not present

## 2020-03-18 DIAGNOSIS — M9903 Segmental and somatic dysfunction of lumbar region: Secondary | ICD-10-CM | POA: Diagnosis not present

## 2020-03-18 DIAGNOSIS — M9905 Segmental and somatic dysfunction of pelvic region: Secondary | ICD-10-CM | POA: Diagnosis not present

## 2020-04-08 ENCOUNTER — Encounter: Payer: Self-pay | Admitting: Family Medicine

## 2020-04-08 ENCOUNTER — Ambulatory Visit: Payer: BC Managed Care – PPO | Admitting: Family Medicine

## 2020-04-08 ENCOUNTER — Other Ambulatory Visit: Payer: Self-pay

## 2020-04-08 VITALS — BP 122/72 | HR 79 | Temp 98.0°F | Ht 67.0 in | Wt 185.6 lb

## 2020-04-08 DIAGNOSIS — F17219 Nicotine dependence, cigarettes, with unspecified nicotine-induced disorders: Secondary | ICD-10-CM

## 2020-04-08 DIAGNOSIS — R0981 Nasal congestion: Secondary | ICD-10-CM | POA: Insufficient documentation

## 2020-04-08 DIAGNOSIS — B372 Candidiasis of skin and nail: Secondary | ICD-10-CM | POA: Diagnosis not present

## 2020-04-08 DIAGNOSIS — G629 Polyneuropathy, unspecified: Secondary | ICD-10-CM

## 2020-04-08 DIAGNOSIS — E7800 Pure hypercholesterolemia, unspecified: Secondary | ICD-10-CM | POA: Diagnosis not present

## 2020-04-08 DIAGNOSIS — F411 Generalized anxiety disorder: Secondary | ICD-10-CM

## 2020-04-08 MED ORDER — NYSTATIN 100000 UNIT/GM EX CREA: 1.0000 "application " | TOPICAL_CREAM | Freq: Two times a day (BID) | CUTANEOUS | 0 refills | Status: DC

## 2020-04-08 MED ORDER — CYCLOBENZAPRINE HCL 5 MG PO TABS: 5.0000 mg | ORAL_TABLET | Freq: Every day | ORAL | 0 refills | Status: DC

## 2020-04-08 NOTE — Patient Instructions (Addendum)
mucinex 600mg  -12 hour BID-take 1-2 twice a day-no antihistamines while taking mucinex  Flonase -use 2 sprays to each nostril  Nystatin cream-rx  Ocean spray/Ayrgel-use to decrease nasal dryness When you take flexeril-do not drive or operate heavy equipment
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
## 2020-04-22 DIAGNOSIS — M9902 Segmental and somatic dysfunction of thoracic region: Secondary | ICD-10-CM | POA: Diagnosis not present

## 2020-04-22 DIAGNOSIS — M9905 Segmental and somatic dysfunction of pelvic region: Secondary | ICD-10-CM | POA: Diagnosis not present

## 2020-04-22 DIAGNOSIS — M9903 Segmental and somatic dysfunction of lumbar region: Secondary | ICD-10-CM | POA: Diagnosis not present

## 2020-04-22 DIAGNOSIS — M5416 Radiculopathy, lumbar region: Secondary | ICD-10-CM | POA: Diagnosis not present

## 2020-04-28 ENCOUNTER — Other Ambulatory Visit: Payer: Self-pay | Admitting: Physician Assistant

## 2020-05-20 ENCOUNTER — Encounter: Payer: Self-pay | Admitting: Family Medicine

## 2020-05-20 ENCOUNTER — Ambulatory Visit (INDEPENDENT_AMBULATORY_CARE_PROVIDER_SITE_OTHER): Payer: BC Managed Care – PPO | Admitting: Family Medicine

## 2020-05-20 VITALS — BP 118/72 | HR 80 | Temp 98.2°F

## 2020-05-20 DIAGNOSIS — J189 Pneumonia, unspecified organism: Secondary | ICD-10-CM

## 2020-05-20 DIAGNOSIS — J441 Chronic obstructive pulmonary disease with (acute) exacerbation: Secondary | ICD-10-CM | POA: Diagnosis not present

## 2020-05-20 DIAGNOSIS — J018 Other acute sinusitis: Secondary | ICD-10-CM | POA: Diagnosis not present

## 2020-05-20 DIAGNOSIS — R079 Chest pain, unspecified: Secondary | ICD-10-CM | POA: Diagnosis not present

## 2020-05-20 LAB — POC COVID19 BINAXNOW: SARS Coronavirus 2 Ag: NEGATIVE

## 2020-05-20 MED ORDER — CEFTRIAXONE SODIUM 1 G IJ SOLR
1.0000 g | Freq: Once | INTRAMUSCULAR | Status: AC
  Administered 2020-05-20: 1 g via INTRAMUSCULAR

## 2020-05-20 MED ORDER — BUDESONIDE-FORMOTEROL FUMARATE 160-4.5 MCG/ACT IN AERO: 2.0000 | INHALATION_SPRAY | Freq: Two times a day (BID) | RESPIRATORY_TRACT | 3 refills | Status: DC

## 2020-05-20 MED ORDER — PREDNISONE 50 MG PO TABS: 50.0000 mg | ORAL_TABLET | Freq: Every day | ORAL | 0 refills | Status: DC

## 2020-05-20 MED ORDER — AMOXICILLIN-POT CLAVULANATE 875-125 MG PO TABS
1.0000 | ORAL_TABLET | Freq: Two times a day (BID) | ORAL | 0 refills | Status: DC
Start: 2020-05-20 — End: 2020-10-01

## 2020-05-20 MED ORDER — TRIAMCINOLONE ACETONIDE 40 MG/ML IJ SUSP
80.0000 mg | Freq: Once | INTRAMUSCULAR | Status: AC
  Administered 2020-05-20: 80 mg via INTRAMUSCULAR

## 2020-05-20 NOTE — Patient Instructions (Addendum)
Start on Augmentin 875 mg 1 twice daily. Prednisone 50 mg once daily for 5 days. Albuterol inhaler 2 puffs 4 times a day for the next 48 hours and then go to 4 times a day as needed. Get chest x-ray at outpatient center at [facility]. If not improving take Symbicort 2 puffs twice daily.  Prescription sent to the pharmacy if you do not have some at home. Follow-up as needed if no improvement.  Recommend chronic follow-up in 6 weeks.     Chronic Obstructive Pulmonary Disease Chronic obstructive pulmonary disease (COPD) is a long-term (chronic) lung problem. When you have COPD, it is hard for air to get in and out of your lungs. Usually the condition gets worse over time, and your lungs will never return to normal. There are things you can do to keep yourself as healthy as possible.  Your doctor may treat your condition with: ? Medicines. ? Oxygen. ? Lung surgery.  Your doctor may also recommend: ? Rehabilitation. This includes steps to make your body work better. It may involve a team of specialists. ? Quitting smoking, if you smoke. ? Exercise and changes to your diet. ? Comfort measures (palliative care). Follow these instructions at home: Medicines  Take over-the-counter and prescription medicines only as told by your doctor.  Talk to your doctor before taking any cough or allergy medicines. You may need to avoid medicines that cause your lungs to be dry. Lifestyle  If you smoke, stop. Smoking makes the problem worse. If you need help quitting, ask your doctor.  Avoid being around things that make your breathing worse. This may include smoke, chemicals, and fumes.  Stay active, but remember to rest as well.  Learn and use tips on how to relax.  Make sure you get enough sleep. Most adults need at least 7 hours of sleep every night.  Eat healthy foods. Eat smaller meals more often. Rest before meals. Controlled breathing Learn and use tips on how to control your  breathing as told by your doctor. Try:  Breathing in (inhaling) through your nose for 1 second. Then, pucker your lips and breath out (exhale) through your lips for 2 seconds.  Putting one hand on your belly (abdomen). Breathe in slowly through your nose for 1 second. Your hand on your belly should move out. Pucker your lips and breathe out slowly through your lips. Your hand on your belly should move in as you breathe out.  Controlled coughing Learn and use controlled coughing to clear mucus from your lungs. Follow these steps: 1. Lean your head a little forward. 2. Breathe in deeply. 3. Try to hold your breath for 3 seconds. 4. Keep your mouth slightly open while coughing 2 times. 5. Spit any mucus out into a tissue. 6. Rest and do the steps again 1 or 2 times as needed. General instructions  Make sure you get all the shots (vaccines) that your doctor recommends. Ask your doctor about a flu shot and a pneumonia shot.  Use oxygen therapy and pulmonary rehabilitation if told by your doctor. If you need home oxygen therapy, ask your doctor if you should buy a tool to measure your oxygen level (oximeter).  Make a COPD action plan with your doctor. This helps you to know what to do if you feel worse than usual.  Manage any other conditions you have as told by your doctor.  Avoid going outside when it is very hot, cold, or humid.  Avoid people who  have a sickness you can catch (contagious).  Keep all follow-up visits as told by your doctor. This is important. Contact a doctor if:  You cough up more mucus than usual.  There is a change in the color or thickness of the mucus.  It is harder to breathe than usual.  Your breathing is faster than usual.  You have trouble sleeping.  You need to use your medicines more often than usual.  You have trouble doing your normal activities such as getting dressed or walking around the house. Get help right away if:  You have shortness of  breath while resting.  You have shortness of breath that stops you from: ? Being able to talk. ? Doing normal activities.  Your chest hurts for longer than 5 minutes.  Your skin color is more blue than usual.  Your pulse oximeter shows that you have low oxygen for longer than 5 minutes.  You have a fever.  You feel too tired to breathe normally. Summary  Chronic obstructive pulmonary disease (COPD) is a long-term lung problem.  The way your lungs work will never return to normal. Usually the condition gets worse over time. There are things you can do to keep yourself as healthy as possible.  Take over-the-counter and prescription medicines only as told by your doctor.  If you smoke, stop. Smoking makes the problem worse. This information is not intended to replace advice given to you by your health care provider. Make sure you discuss any questions you have with your health care provider. Document Revised: 08/19/2017 Document Reviewed: 10/11/2016 Elsevier Patient Education  2020 [employer].

## 2020-05-20 NOTE — Progress Notes (Signed)
Acute Office Visit  Subjective:    Patient ID: [name], female    DOB: [birth year], 54 y.o.   MRN: [id number]  Chief Complaint  Patient presents with   Cough    HPI Patient is in today for cough, sob, and left sided chest pain. Denies fever.  Past Medical History:  Diagnosis Date   Allergic rhinitis    Arthritis    Blood transfusion without reported diagnosis    COPD (chronic obstructive pulmonary disease) (HCC)    GERD (gastroesophageal reflux disease)    History of colon polyps    HTN (hypertension)    Hyperlipidemia    Kidney stones    Pancreatitis    Pneumonia    Psychotic depression [facility])    Shingles February 26th, 2001    Past Surgical History:  Procedure Laterality Date   ABDOMINAL HYSTERECTOMY     CESAREAN SECTION     COLONOSCOPY  05/02/2017   Colonic polyp status post polypectomy. Small internal hemorrhoids   GALLBLADDER SURGERY     REPLACEMENT TOTAL KNEE Right 02/09/2019   TOTAL KNEE ARTHROPLASTY  02/09/2019   TUBAL LIGATION      Family History  Problem Relation Age of Onset   COPD Mother    Parkinson's disease Mother    COPD Father    Arthritis Father    Lung disease Father    Colon cancer Cousin        mother's cousin   Breast cancer Maternal Aunt        great aunt   Diabetes Other    Stroke Other    Hypertension Other    Hyperlipidemia Other    Asthma Other    Heart failure Other    Thyroid disease Other    Heart attack Other    COPD Other    Arrhythmia Other    Arthritis Other    Migraines Other     Social History   Socioeconomic History   Marital status: Widowed    Spouse name: Not on file   Number of children: 2   Years of education: Not on file   Highest education level: Not on file  Occupational History   Not on file  Tobacco Use   Smoking status: Current Every Day Smoker    Packs/day: 1.00    Types: Cigarettes   Smokeless tobacco: Never Used   Tobacco  comment: smokes a pack and a half daily 11.25.19  Vaping Use   Vaping Use: Never used  Substance and Sexual Activity   Alcohol use: Yes   Drug use: No   Sexual activity: Not on file  Other Topics Concern   Not on file  Social History Narrative   Not on file   Social Determinants of Health   Financial Resource Strain:    Difficulty of Paying Living Expenses: Not on file  Food Insecurity:    Worried About [place] in the Last Year: Not on file   Ran Out of Food in the Last Year: Not on file  Transportation Needs:    Lack of Transportation (Medical): Not on file   Lack of Transportation (Non-Medical): Not on file  Physical Activity:    Days of Exercise per Week: Not on file   Minutes of Exercise per Session: Not on file  Stress:    Feeling of Stress : Not on file  Social Connections:    Frequency of Communication with Friends and Family: Not on file   Frequency  of Social Gatherings with Friends and Family: Not on file   Attends Religious Services: Not on file   Active Member of Clubs or Organizations: Not on file   Attends Archivist Meetings: Not on file   Marital Status: Not on file  Intimate Partner Violence:    Fear of Current or Ex-Partner: Not on file   Emotionally Abused: Not on file   Physically Abused: Not on file   Sexually Abused: Not on file    Outpatient Medications Prior to Visit  Medication Sig Dispense Refill   albuterol (PROVENTIL) (2.5 MG/3ML) 0.083% nebulizer solution as needed.   6   albuterol (VENTOLIN HFA) 108 (90 Base) MCG/ACT inhaler Inhale 2 puffs into the lungs every 6 (six) hours as needed for wheezing or shortness of breath. 8 g 5   ALPRAZolam (XANAX) 0.25 MG tablet TAKE 1 TABLET(0.25 MG) BY MOUTH DAILY AS NEEDED FOR ANXIETY 30 tablet 0   amLODipine (NORVASC) 10 MG tablet Take 1 tablet (10 mg total) by mouth daily. 90 tablet 1   atorvastatin (LIPITOR) 20 MG tablet Take 1 tablet (20 mg total) by  mouth daily. 90 tablet 0   cyclobenzaprine (FLEXERIL) 5 MG tablet Take 1 tablet (5 mg total) by mouth at bedtime. 90 tablet 0   montelukast (SINGULAIR) 10 MG tablet Take 1 tablet (10 mg total) by mouth daily. 90 tablet 3   Multiple Vitamins-Minerals (50+ ADULT EYE HEALTH PO) Take 1 tablet by mouth daily.     nitroGLYCERIN (NITROSTAT) 0.4 MG SL tablet nitroglycerin 0.4 mg sublingual tablet  DISSOLVE TNT Q 5 MINUTES     pantoprazole (PROTONIX) 40 MG tablet Take 1 tablet (40 mg total) by mouth 2 (two) times daily. 60 tablet 6   topiramate (TOPAMAX) 50 MG tablet Take 50 mg by mouth daily.   0   vitamin E 180 MG (400 UNITS) capsule Take by mouth.     No facility-administered medications prior to visit.    Allergies  Allergen Reactions   Levofloxacin Other (See Comments) and Rash    Rash all over redness    Levaquin [Levofloxacin In D5w]    Tape Hives   Codeine Nausea Only   Other Rash    bandaids leave a red area if left on too long    Review of Systems  Constitutional: Positive for fever. Negative for chills and fatigue.  HENT: Negative for congestion, ear pain and sore throat.   Respiratory: Positive for cough and shortness of breath.   Cardiovascular: Positive for chest pain.       Objective:    Physical Exam Vitals reviewed.  Constitutional:      Appearance: Normal appearance. She is normal weight.  HENT:     Right Ear: Tympanic membrane, ear canal and external ear normal.     Left Ear: Tympanic membrane and external ear normal.     Nose: Congestion present.     Comments: BL sinus tenderness.    Mouth/Throat:     Pharynx: Oropharynx is clear.  Cardiovascular:     Rate and Rhythm: Normal rate and regular rhythm.     Heart sounds: Normal heart sounds. No murmur heard.   Pulmonary:     Effort: Pulmonary effort is normal. No respiratory distress.     Breath sounds: Wheezing (BL lower lungs.) present.     Comments: Left lower lobe: tender. Abdominal:      General: Abdomen is flat. Bowel sounds are normal.     Palpations: Abdomen  is soft.     Tenderness: There is no abdominal tenderness.  Neurological:     Mental Status: She is alert and oriented to person, place, and time.  Psychiatric:        Mood and Affect: Mood normal.        Behavior: Behavior normal.     BP 118/72    Pulse 80    Temp 98.2 F (36.8 C)    LMP  (LMP Unknown) Comment: hysterectomy 2008   SpO2 97%  Wt Readings from Last 3 Encounters:  04/08/20 185 lb 9.6 oz (84.2 kg)  01/01/20 185 lb (83.9 kg)  12/11/19 189 lb (85.7 kg)    Health Maintenance Due  Topic Date Due   Hepatitis C Screening  Never done   COVID-19 Vaccine (1) Never done   HIV Screening  Never done   TETANUS/TDAP  Never done   MAMMOGRAM  08/10/2015   COLONOSCOPY  Never done   PAP SMEAR-Modifier  04/05/2017   INFLUENZA VACCINE  04/20/2020    There are no preventive care reminders to display for this patient.   Lab Results  Component Value Date   TSH 2.900 01/01/2020   Lab Results  Component Value Date   WBC 8.4 01/01/2020   HGB 14.4 01/01/2020   HCT 41.1 01/01/2020   MCV 94 01/01/2020   PLT 298 01/01/2020   Lab Results  Component Value Date   NA 142 01/01/2020   K 4.0 01/01/2020   CO2 24 01/01/2020   GLUCOSE 103 (H) 01/01/2020   BUN 9 01/01/2020   CREATININE 0.54 (L) 01/01/2020   BILITOT <0.2 01/01/2020   ALKPHOS 102 01/01/2020   AST 15 01/01/2020   ALT 21 01/01/2020   PROT 6.6 01/01/2020   ALBUMIN 4.6 01/01/2020   CALCIUM 9.7 01/01/2020   No results found for: CHOL No results found for: HDL No results found for: LDLCALC No results found for: TRIG No results found for: CHOLHDL No results found for: HGBA1C     Assessment & Plan:  1. Acute non-recurrent sinusitis of other sinus - Augmentin rx.  - POC COVID-19  2. COPD exacerbation (HCC) - Symbicort 160/4.5 2 puffs twice a day.  - prednisone 50 mg once daily x 5 days. - triamcinolone acetonide (KENALOG-40)  injection 80 mg - DG Chest 2 View; Future  3. Community acquired pneumonia of left lower lobe of lung  - Augmentin rx - cefTRIAXone (ROCEPHIN) injection 1 g - DG Chest 2 View; Future    Meds ordered this encounter  Medications   amoxicillin-clavulanate (AUGMENTIN) 875-125 MG tablet    Sig: Take 1 tablet by mouth 2 (two) times daily.    Dispense:  20 tablet    Refill:  0   predniSONE (DELTASONE) 50 MG tablet    Sig: Take 1 tablet (50 mg total) by mouth daily with breakfast.    Dispense:  5 tablet    Refill:  0   triamcinolone acetonide (KENALOG-40) injection 80 mg   cefTRIAXone (ROCEPHIN) injection 1 g   budesonide-formoterol (SYMBICORT) 160-4.5 MCG/ACT inhaler    Sig: Inhale 2 puffs into the lungs 2 (two) times daily.    Dispense:  1 each    Refill:  3    Please put on file    Orders Placed This Encounter  Procedures   DG Chest 2 View   POC COVID-19     Follow-up: No follow-ups on file.  An After Visit Summary was printed and given to the  patient.  Rochel Brome [facility] [phone]

## 2020-05-21 ENCOUNTER — Telehealth: Payer: Self-pay

## 2020-05-21 NOTE — Telephone Encounter (Signed)
Patient called requesting xray results and pain medication. Per Dr. Henrene Pastor patients chest xray was normal and she can treat her pain with ibuprofen or tylenol. Patient is aware of results and treatment for pain. Patient expressed that tylenol, ibuprofen and flexeril have not helped. Dr. Henrene Pastor stated that narcotics were not needed nor would they be prescribed at this time. Patient also informed of this information.

## 2020-05-21 NOTE — Telephone Encounter (Signed)
Pt request pain med sent to [facility].  [name] states left chest rib lung hurts. Also pt request result of xray yesterday.

## 2020-05-26 ENCOUNTER — Encounter: Payer: Self-pay | Admitting: Family Medicine

## 2020-05-27 ENCOUNTER — Other Ambulatory Visit: Payer: Self-pay

## 2020-05-27 ENCOUNTER — Other Ambulatory Visit: Payer: Self-pay | Admitting: Family Medicine

## 2020-05-27 ENCOUNTER — Telehealth: Payer: Self-pay

## 2020-05-27 DIAGNOSIS — M9905 Segmental and somatic dysfunction of pelvic region: Secondary | ICD-10-CM | POA: Diagnosis not present

## 2020-05-27 DIAGNOSIS — M9903 Segmental and somatic dysfunction of lumbar region: Secondary | ICD-10-CM | POA: Diagnosis not present

## 2020-05-27 DIAGNOSIS — M5416 Radiculopathy, lumbar region: Secondary | ICD-10-CM | POA: Diagnosis not present

## 2020-05-27 DIAGNOSIS — M9902 Segmental and somatic dysfunction of thoracic region: Secondary | ICD-10-CM | POA: Diagnosis not present

## 2020-05-27 MED ORDER — NYSTATIN 100000 UNIT/ML MT SUSP: 5.0000 mL | Freq: Four times a day (QID) | OROMUCOSAL | 0 refills | Status: DC

## 2020-05-27 MED ORDER — FLUCONAZOLE 150 MG PO TABS
150.0000 mg | ORAL_TABLET | Freq: Every day | ORAL | 0 refills | Status: DC
Start: 2020-05-27 — End: 2020-05-28

## 2020-05-27 NOTE — Telephone Encounter (Signed)
Alecia called to report that she stopped the Augmentin due to thrush and vaginal irritation.  She had 4 doses left when she stopped the medication.  Dr. Tobie Poet advised nystatin and diflucan.

## 2020-05-28 ENCOUNTER — Other Ambulatory Visit: Payer: Self-pay

## 2020-05-28 ENCOUNTER — Ambulatory Visit: Payer: BC Managed Care – PPO | Admitting: Family Medicine

## 2020-05-28 ENCOUNTER — Encounter: Payer: Self-pay | Admitting: Family Medicine

## 2020-05-28 VITALS — BP 124/78 | HR 88 | Temp 97.5°F | Ht 67.0 in | Wt 185.0 lb

## 2020-05-28 DIAGNOSIS — R1012 Left upper quadrant pain: Secondary | ICD-10-CM

## 2020-05-28 DIAGNOSIS — B028 Zoster with other complications: Secondary | ICD-10-CM | POA: Diagnosis not present

## 2020-05-28 DIAGNOSIS — M546 Pain in thoracic spine: Secondary | ICD-10-CM | POA: Diagnosis not present

## 2020-05-28 MED ORDER — ALPRAZOLAM 0.25 MG PO TABS
ORAL_TABLET | ORAL | 1 refills | Status: DC
Start: 2020-05-28 — End: 2020-12-12

## 2020-05-28 MED ORDER — VALACYCLOVIR HCL 500 MG PO TABS: 500.0000 mg | ORAL_TABLET | Freq: Three times a day (TID) | ORAL | 0 refills | Status: DC

## 2020-05-28 MED ORDER — TRAMADOL HCL 50 MG PO TABS: 50.0000 mg | ORAL_TABLET | Freq: Three times a day (TID) | ORAL | 0 refills | Status: AC | PRN

## 2020-05-28 MED ORDER — METHOCARBAMOL 500 MG PO TABS: 500.0000 mg | ORAL_TABLET | Freq: Three times a day (TID) | ORAL | 0 refills | Status: DC

## 2020-05-28 NOTE — Patient Instructions (Addendum)
Possible shingles: Valtrex rx sent.  Stomach pain: concerned about ulcer -  Stop ibuprofen!! Continue pantoprazole 40 mg one twice a day.  Tramadol rx for pain Start on methocarbamol 500 mg one three times a day as needed for muscle pain.  Stop flexeril.  Xanax rx sent.

## 2020-05-28 NOTE — Progress Notes (Signed)
Acute Office Visit  Subjective:    Patient ID: [name], female    DOB: May 04, 1966, 54 y.o.   MRN: [id number]  Chief Complaint  Patient presents with  . Back Pain    HPI Patient is in today for left sided tenderness, has had a burning sensation in her back and abdomin. Symptoms have been present for 1 week. Has been taking 4 advil every day which has not helped much, taking xanax to fall asleep.  Past Medical History:  Diagnosis Date  . Allergic rhinitis   . Arthritis   . Blood transfusion without reported diagnosis   . COPD (chronic obstructive pulmonary disease) ([place])   . GERD (gastroesophageal reflux disease)   . History of colon polyps   . HTN (hypertension)   . Hyperlipidemia   . Kidney stones   . Pancreatitis   . Pneumonia   . Psychotic depression ([place])   . Shingles February 26th, 2001    Past Surgical History:  Procedure Laterality Date  . ABDOMINAL HYSTERECTOMY    . CESAREAN SECTION    . COLONOSCOPY  05/02/2017   Colonic polyp status post polypectomy. Small internal hemorrhoids  . GALLBLADDER SURGERY    . REPLACEMENT TOTAL KNEE Right 02/09/2019  . TOTAL KNEE ARTHROPLASTY  02/09/2019  . TUBAL LIGATION      Family History  Problem Relation Age of Onset  . COPD Mother   . Parkinson's disease Mother   . COPD Father   . Arthritis Father   . Lung disease Father   . Colon cancer Cousin        mother's cousin  . Breast cancer Maternal Aunt        great aunt  . Diabetes Other   . Stroke Other   . Hypertension Other   . Hyperlipidemia Other   . Asthma Other   . Heart failure Other   . Thyroid disease Other   . Heart attack Other   . COPD Other   . Arrhythmia Other   . Arthritis Other   . Migraines Other     Social History   Socioeconomic History  . Marital status: Widowed    Spouse name: Not on file  . Number of children: 2  . Years of education: Not on file  . Highest education level: Not on file  Occupational History  . Not on  file  Tobacco Use  . Smoking status: Current Every Day Smoker    Packs/day: 1.00    Types: Cigarettes  . Smokeless tobacco: Never Used  . Tobacco comment: smokes a pack and a half daily 11.25.19  Vaping Use  . Vaping Use: Never used  Substance and Sexual Activity  . Alcohol use: Yes  . Drug use: No  . Sexual activity: Not on file  Other Topics Concern  . Not on file  Social History Narrative  . Not on file   Social Determinants of Health   Financial Resource Strain:   . Difficulty of Paying Living Expenses: Not on file  Food Insecurity:   . Worried About Charity fundraiser in the Last Year: Not on file  . Ran Out of Food in the Last Year: Not on file  Transportation Needs:   . Lack of Transportation (Medical): Not on file  . Lack of Transportation (Non-Medical): Not on file  Physical Activity:   . Days of Exercise per Week: Not on file  . Minutes of Exercise per Session: Not on file  Stress:   .  Feeling of Stress : Not on file  Social Connections:   . Frequency of Communication with Friends and Family: Not on file  . Frequency of Social Gatherings with Friends and Family: Not on file  . Attends Religious Services: Not on file  . Active Member of Clubs or Organizations: Not on file  . Attends Archivist Meetings: Not on file  . Marital Status: Not on file  Intimate Partner Violence:   . Fear of Current or Ex-Partner: Not on file  . Emotionally Abused: Not on file  . Physically Abused: Not on file  . Sexually Abused: Not on file    Outpatient Medications Prior to Visit  Medication Sig Dispense Refill  . albuterol (PROVENTIL) (2.5 MG/3ML) 0.083% nebulizer solution as needed.   6  . albuterol (VENTOLIN HFA) 108 (90 Base) MCG/ACT inhaler Inhale 2 puffs into the lungs every 6 (six) hours as needed for wheezing or shortness of breath. 8 g 5  . amLODipine (NORVASC) 10 MG tablet Take 1 tablet (10 mg total) by mouth daily. 90 tablet 1  . amoxicillin-clavulanate  (AUGMENTIN) 875-125 MG tablet Take 1 tablet by mouth 2 (two) times daily. 20 tablet 0  . atorvastatin (LIPITOR) 20 MG tablet Take 1 tablet (20 mg total) by mouth daily. 90 tablet 0  . budesonide-formoterol (SYMBICORT) 160-4.5 MCG/ACT inhaler Inhale 2 puffs into the lungs 2 (two) times daily. 1 each 3  . montelukast (SINGULAIR) 10 MG tablet Take 1 tablet (10 mg total) by mouth daily. 90 tablet 3  . Multiple Vitamins-Minerals (50+ ADULT EYE HEALTH PO) Take 1 tablet by mouth daily.    . nitroGLYCERIN (NITROSTAT) 0.4 MG SL tablet nitroglycerin 0.4 mg sublingual tablet  DISSOLVE TNT Q 5 MINUTES    . nystatin (MYCOSTATIN) 100000 UNIT/ML suspension Take 5 mLs (500,000 Units total) by mouth 4 (four) times daily. 400 mL 0  . pantoprazole (PROTONIX) 40 MG tablet Take 1 tablet (40 mg total) by mouth 2 (two) times daily. 60 tablet 6  . topiramate (TOPAMAX) 50 MG tablet Take 50 mg by mouth daily.   0  . vitamin E 180 MG (400 UNITS) capsule Take by mouth.    . ALPRAZolam (XANAX) 0.25 MG tablet TAKE 1 TABLET(0.25 MG) BY MOUTH DAILY AS NEEDED FOR ANXIETY 30 tablet 0  . cyclobenzaprine (FLEXERIL) 5 MG tablet Take 1 tablet (5 mg total) by mouth at bedtime. 90 tablet 0  . fluconazole (DIFLUCAN) 150 MG tablet Take 1 tablet (150 mg total) by mouth daily. 1 tablet 0  . predniSONE (DELTASONE) 50 MG tablet Take 1 tablet (50 mg total) by mouth daily with breakfast. 5 tablet 0   No facility-administered medications prior to visit.    Allergies  Allergen Reactions  . Levofloxacin Other (See Comments) and Rash    Rash all over redness   . Levaquin [Levofloxacin In D5w]   . Tape Hives  . Codeine Nausea Only  . Other Rash    bandaids leave a red area if left on too long    Review of Systems  Constitutional: Positive for fatigue. Negative for chills and fever.  HENT: Positive for sore throat. Negative for congestion, ear pain and rhinorrhea.   Respiratory: Negative for cough and shortness of breath.     Gastrointestinal: Negative for abdominal pain, constipation, diarrhea, nausea and vomiting.  Endocrine: Positive for polydipsia. Negative for polyphagia.  Genitourinary: Negative for dysuria and urgency.  Musculoskeletal: Positive for back pain and myalgias.  Neurological: Positive for  headaches. Negative for dizziness, weakness and light-headedness.  Psychiatric/Behavioral: Negative for dysphoric mood. The patient is not nervous/anxious.        Objective:    Physical Exam Constitutional:      Appearance: Normal appearance.  Cardiovascular:     Rate and Rhythm: Normal rate and regular rhythm.     Heart sounds: Normal heart sounds.  Pulmonary:     Effort: Pulmonary effort is normal.     Breath sounds: Normal breath sounds.  Abdominal:     General: There is no distension.     Palpations: Abdomen is soft.     Tenderness: There is abdominal tenderness (LUQ).  Musculoskeletal:        General: Tenderness (left posterior upper lumbar back/lower thoracic.) present.  Skin:    Findings: No rash.  Neurological:     Mental Status: She is alert.  Psychiatric:        Mood and Affect: Mood normal.        Behavior: Behavior normal.     BP 124/78   Pulse 88   Temp (!) 97.5 F (36.4 C)   Ht 5\' 7"  (1.702 m)   Wt 185 lb (83.9 kg)   LMP  (LMP Unknown) Comment: hysterectomy 2008  SpO2 100%   BMI 28.98 kg/m  Wt Readings from Last 3 Encounters:  05/28/20 185 lb (83.9 kg)  04/08/20 185 lb 9.6 oz (84.2 kg)  01/01/20 185 lb (83.9 kg)    Health Maintenance Due  Topic Date Due  . Hepatitis C Screening  Never done  . COVID-19 Vaccine (1) Never done  . HIV Screening  Never done  . TETANUS/TDAP  Never done  . MAMMOGRAM  08/10/2015  . COLONOSCOPY  Never done  . PAP SMEAR-Modifier  04/05/2017  . INFLUENZA VACCINE  04/20/2020    There are no preventive care reminders to display for this patient.   Lab Results  Component Value Date   TSH 2.900 01/01/2020   Lab Results  Component  Value Date   WBC 8.4 01/01/2020   HGB 14.4 01/01/2020   HCT 41.1 01/01/2020   MCV 94 01/01/2020   PLT 298 01/01/2020   Lab Results  Component Value Date   NA 142 01/01/2020   K 4.0 01/01/2020   CO2 24 01/01/2020   GLUCOSE 103 (H) 01/01/2020   BUN 9 01/01/2020   CREATININE 0.54 (L) 01/01/2020   BILITOT <0.2 01/01/2020   ALKPHOS 102 01/01/2020   AST 15 01/01/2020   ALT 21 01/01/2020   PROT 6.6 01/01/2020   ALBUMIN 4.6 01/01/2020   CALCIUM 9.7 01/01/2020   No results found for: CHOL No results found for: HDL No results found for: LDLCALC No results found for: TRIG No results found for: CHOLHDL No results found for: HGBA1C       Assessment & Plan:  1. LUQ abdominal pain - Stomach pain: concerned about ulcer -  Stop ibuprofen!! Continue pantoprazole 40 mg one twice a day.  stop all nsaids.  Rx: Valtrex Methocarbamol. Tramadol.  2. Herpes zoster with other complication Symptoms concerning for zoster even though no rash.. Start on valtrex.  3. Acute left-sided thoracic back pain Zoster vs musculoskeletal. Rx: Valtrex Methocarbamol. Stop flexeril. Tramadol.   Meds ordered this encounter  Medications  . valACYclovir (VALTREX) 500 MG tablet    Sig: Take 1 tablet (500 mg total) by mouth 3 (three) times daily.    Dispense:  21 tablet    Refill:  0  . traMADol (  ULTRAM) 50 MG tablet    Sig: Take 1 tablet (50 mg total) by mouth every 8 (eight) hours as needed for up to 5 days.    Dispense:  15 tablet    Refill:  0  . methocarbamol (ROBAXIN) 500 MG tablet    Sig: Take 1 tablet (500 mg total) by mouth 3 (three) times daily.    Dispense:  42 tablet    Refill:  0  . ALPRAZolam (XANAX) 0.25 MG tablet    Sig: TAKE 1 TABLET(0.25 MG) BY MOUTH DAILY AS NEEDED FOR ANXIETY    Dispense:  30 tablet    Refill:  1   Follow up: 4 weeks (sooner if sxs worsen or fail to improve.)  Rochel Brome, MD

## 2020-05-30 DIAGNOSIS — R109 Unspecified abdominal pain: Secondary | ICD-10-CM | POA: Diagnosis not present

## 2020-05-30 DIAGNOSIS — M549 Dorsalgia, unspecified: Secondary | ICD-10-CM | POA: Diagnosis not present

## 2020-05-30 DIAGNOSIS — R1084 Generalized abdominal pain: Secondary | ICD-10-CM | POA: Diagnosis not present

## 2020-06-01 ENCOUNTER — Telehealth: Payer: Self-pay | Admitting: Urology

## 2020-06-01 NOTE — Telephone Encounter (Signed)
Returned patient call to our answering service regarding a possible new kidney stone, but there was no answer. I left a voicemail without our clinic contact number, and I instructed her to call our clinic first thing tomorrow morning to make an appointment if her symptoms are currently controlled. I advised her to go to the ED for fevers/chills and/or severe symptoms that aren't controlled with NSAIDS.

## 2020-06-02 DIAGNOSIS — M5136 Other intervertebral disc degeneration, lumbar region: Secondary | ICD-10-CM | POA: Diagnosis not present

## 2020-06-02 DIAGNOSIS — I700 Atherosclerosis of aorta: Secondary | ICD-10-CM | POA: Diagnosis not present

## 2020-06-02 DIAGNOSIS — Z87442 Personal history of urinary calculi: Secondary | ICD-10-CM | POA: Diagnosis not present

## 2020-06-02 DIAGNOSIS — R1084 Generalized abdominal pain: Secondary | ICD-10-CM | POA: Diagnosis not present

## 2020-06-10 DIAGNOSIS — M5416 Radiculopathy, lumbar region: Secondary | ICD-10-CM | POA: Diagnosis not present

## 2020-06-10 DIAGNOSIS — M9905 Segmental and somatic dysfunction of pelvic region: Secondary | ICD-10-CM | POA: Diagnosis not present

## 2020-06-10 DIAGNOSIS — M9903 Segmental and somatic dysfunction of lumbar region: Secondary | ICD-10-CM | POA: Diagnosis not present

## 2020-06-10 DIAGNOSIS — M9902 Segmental and somatic dysfunction of thoracic region: Secondary | ICD-10-CM | POA: Diagnosis not present

## 2020-06-12 ENCOUNTER — Other Ambulatory Visit: Payer: Self-pay

## 2020-06-12 MED ORDER — DICYCLOMINE HCL 10 MG PO CAPS
10.0000 mg | ORAL_CAPSULE | Freq: Three times a day (TID) | ORAL | 2 refills | Status: DC
Start: 2020-06-12 — End: 2021-08-05

## 2020-06-12 NOTE — Telephone Encounter (Signed)
Patient left a vm stating she had some bentyl which has helped with her "side" pain that she was seen for, she is requesting a refill.

## 2020-06-13 ENCOUNTER — Other Ambulatory Visit: Payer: Self-pay | Admitting: Family Medicine

## 2020-06-17 DIAGNOSIS — M51369 Other intervertebral disc degeneration, lumbar region without mention of lumbar back pain or lower extremity pain: Secondary | ICD-10-CM | POA: Insufficient documentation

## 2020-06-17 DIAGNOSIS — M5136 Other intervertebral disc degeneration, lumbar region: Secondary | ICD-10-CM | POA: Insufficient documentation

## 2020-06-17 DIAGNOSIS — M549 Dorsalgia, unspecified: Secondary | ICD-10-CM | POA: Diagnosis not present

## 2020-06-17 DIAGNOSIS — I201 Angina pectoris with documented spasm: Secondary | ICD-10-CM | POA: Diagnosis not present

## 2020-06-23 ENCOUNTER — Other Ambulatory Visit: Payer: Self-pay | Admitting: Gastroenterology

## 2020-06-28 ENCOUNTER — Other Ambulatory Visit: Payer: Self-pay | Admitting: Family Medicine

## 2020-06-30 ENCOUNTER — Ambulatory Visit: Payer: BC Managed Care – PPO | Admitting: Family Medicine

## 2020-07-09 ENCOUNTER — Other Ambulatory Visit: Payer: Self-pay | Admitting: Family Medicine

## 2020-07-16 ENCOUNTER — Encounter: Payer: Self-pay | Admitting: Family Medicine

## 2020-07-16 ENCOUNTER — Other Ambulatory Visit: Payer: Self-pay

## 2020-07-16 MED ORDER — NICOTINE 21 MG/24HR TD PT24: 21.0000 mg | MEDICATED_PATCH | Freq: Every day | TRANSDERMAL | 0 refills | Status: DC

## 2020-08-07 ENCOUNTER — Encounter: Payer: Self-pay | Admitting: Family Medicine

## 2020-08-11 ENCOUNTER — Other Ambulatory Visit: Payer: Self-pay

## 2020-08-11 MED ORDER — OMEPRAZOLE 40 MG PO CPDR: 40.0000 mg | DELAYED_RELEASE_CAPSULE | Freq: Every day | ORAL | 2 refills | Status: DC

## 2020-09-01 NOTE — Telephone Encounter (Signed)
Sent. Kc  °

## 2020-09-05 DIAGNOSIS — Z20828 Contact with and (suspected) exposure to other viral communicable diseases: Secondary | ICD-10-CM | POA: Diagnosis not present

## 2020-09-05 DIAGNOSIS — J0100 Acute maxillary sinusitis, unspecified: Secondary | ICD-10-CM | POA: Diagnosis not present

## 2020-09-13 ENCOUNTER — Other Ambulatory Visit: Payer: Self-pay | Admitting: Family Medicine

## 2020-09-16 ENCOUNTER — Other Ambulatory Visit: Payer: Self-pay

## 2020-09-23 ENCOUNTER — Other Ambulatory Visit: Payer: Self-pay

## 2020-09-23 MED ORDER — ATORVASTATIN CALCIUM 20 MG PO TABS
20.0000 mg | ORAL_TABLET | Freq: Every day | ORAL | 0 refills | Status: DC
Start: 2020-09-23 — End: 2020-10-29

## 2020-10-01 ENCOUNTER — Telehealth (INDEPENDENT_AMBULATORY_CARE_PROVIDER_SITE_OTHER): Payer: 59 | Admitting: Family Medicine

## 2020-10-01 ENCOUNTER — Encounter: Payer: Self-pay | Admitting: Family Medicine

## 2020-10-01 VITALS — Temp 99.9°F | Ht 67.0 in | Wt 184.0 lb

## 2020-10-01 DIAGNOSIS — R059 Cough, unspecified: Secondary | ICD-10-CM | POA: Diagnosis not present

## 2020-10-01 DIAGNOSIS — R509 Fever, unspecified: Secondary | ICD-10-CM | POA: Insufficient documentation

## 2020-10-01 DIAGNOSIS — J029 Acute pharyngitis, unspecified: Secondary | ICD-10-CM | POA: Insufficient documentation

## 2020-10-01 DIAGNOSIS — U071 COVID-19: Secondary | ICD-10-CM

## 2020-10-01 LAB — POC COVID19 BINAXNOW: SARS Coronavirus 2 Ag: POSITIVE — AB

## 2020-10-01 LAB — POCT INFLUENZA A/B
Influenza A, POC: NEGATIVE
Influenza B, POC: NEGATIVE

## 2020-10-01 LAB — POCT RAPID STREP A (OFFICE): Rapid Strep A Screen: POSITIVE — AB

## 2020-10-01 MED ORDER — AMOXICILLIN 875 MG PO TABS: 875.0000 mg | ORAL_TABLET | Freq: Two times a day (BID) | ORAL | 0 refills | Status: DC

## 2020-10-01 NOTE — Progress Notes (Signed)
Went to [facility] on 12/17 did covid test it was negative and antibiotic. As soon as med was finished she became sick again within two days. Hx of COPD, current smoker 1 pk/day. Nephew had surgery yesterday and did go to hospital. Had pneumonia 1-2 times long time ago.   Complains cough, elevated temp(99.9), nasal congestion, chest congestion, diarrhea, sore throat, headache.  States she had 1 moderna covid shot -04/21/20-,states she did get flu shot no documentation. Boyfriend is also sick (lives in household), has same symptoms.  Virtual Visit via Telephone Note  I connected with [name] on 10/01/20 at  9:30 AM EST by telephone and verified that I am speaking with the correct person using two identifiers.  Location: Patient: home Provider: clinic   I discussed the limitations, risks, security and privacy concerns of performing an evaluation and management service by telephone and the availability of in person appointments. I also discussed with the patient that there may be a patient responsible charge related to this service. The patient expressed understanding and agreed to proceed.   History of Present Illness: Pt went to hospital yesterday-[name]-+COVID-17yo nephew , visited [name] in place.  Brain surgery yesterday. 1/11/22nephew  with abscess on brain from sinuses infection. Pt came home from hospital last night with increase fatigue.Pt with elevated temp 99.9 pt took vitamins and tylenol-650mg  overnight.   Sore throat-noted over the past few days-improved Headache-worried about infection, worse over the eyes in distribution, Pain Scale 5/10-took sudafed.  Mucinex.   Sinus infection 12/17-took antibiotic-augmentin 10/sudafeg PE-helped symptoms  days.  Tob abuse x 1 pack-quit 10 years then when husband passed-7 years.  Pt has albuterol to use as needed. Pt used this morning. No SOB. No daily symbicort-taken in the past Diarrhea this morning Appetite declined No rash Pt uses  singulair for allergies-trigger smell ie perfume Cough-worse laying down-mucous green and thick Moderna COVID -8/21-swelling in ribs after dose-did not take second dose, Influenza [facility] 07/17/20 ALLERGIES to [facility]-rash   Observations/Objective: 99.4-temp, oxygen 98 125/89 Assessment and Plan: 1. Elevated temperature Negative influenza, positive COVID 2. Sore throat-positive-amoxil-rx 3. cough  3. Cough   Follow Up Instructions: [facility]    I discussed the assessment and treatment plan with the patient. The patient was provided an opportunity to ask questions and all were answered. The patient agreed with the plan and demonstrated an understanding of the instructions.   The patient was advised to call back or seek an in-person evaluation if the symptoms worsen or if the condition fails to improve as anticipated. I provided 74minutes of non-face-to-face time during this encounter.   [name] Hannah Beat, MD

## 2020-10-03 ENCOUNTER — Encounter: Payer: Self-pay | Admitting: Family Medicine

## 2020-10-04 ENCOUNTER — Encounter: Payer: Self-pay | Admitting: Family Medicine

## 2020-10-05 LAB — NOVEL CORONAVIRUS, NAA

## 2020-10-06 ENCOUNTER — Encounter: Payer: Self-pay | Admitting: Family Medicine

## 2020-10-06 ENCOUNTER — Telehealth (INDEPENDENT_AMBULATORY_CARE_PROVIDER_SITE_OTHER): Payer: 59 | Admitting: Family Medicine

## 2020-10-06 VITALS — BP 134/82 | HR 97 | Temp 96.9°F

## 2020-10-06 DIAGNOSIS — J208 Acute bronchitis due to other specified organisms: Secondary | ICD-10-CM

## 2020-10-06 DIAGNOSIS — J441 Chronic obstructive pulmonary disease with (acute) exacerbation: Secondary | ICD-10-CM

## 2020-10-06 DIAGNOSIS — J020 Streptococcal pharyngitis: Secondary | ICD-10-CM

## 2020-10-06 DIAGNOSIS — U071 COVID-19: Secondary | ICD-10-CM | POA: Diagnosis not present

## 2020-10-06 MED ORDER — BUDESONIDE-FORMOTEROL FUMARATE 160-4.5 MCG/ACT IN AERO: 2.0000 | INHALATION_SPRAY | Freq: Two times a day (BID) | RESPIRATORY_TRACT | 3 refills | Status: DC

## 2020-10-06 MED ORDER — AMOXICILLIN-POT CLAVULANATE 875-125 MG PO TABS: 1.0000 | ORAL_TABLET | Freq: Two times a day (BID) | ORAL | 0 refills | Status: DC

## 2020-10-06 MED ORDER — ALBUTEROL SULFATE HFA 108 (90 BASE) MCG/ACT IN AERS: 2.0000 | INHALATION_SPRAY | Freq: Four times a day (QID) | RESPIRATORY_TRACT | 5 refills | Status: DC | PRN

## 2020-10-06 MED ORDER — PREDNISONE 50 MG PO TABS: 50.0000 mg | ORAL_TABLET | Freq: Every day | ORAL | 0 refills | Status: DC

## 2020-10-06 NOTE — Telephone Encounter (Signed)
Pt does not qualify for MAB. Kc

## 2020-10-06 NOTE — Progress Notes (Signed)
Virtual Visit via Video Note   This visit type was conducted due to national recommendations for restrictions regarding the COVID-19 Pandemic (e.g. social distancing) in an effort to limit this patient's exposure and mitigate transmission in our community.  Due to her co-morbid illnesses, this patient is at least at moderate risk for complications without adequate follow up.  This format is felt to be most appropriate for this patient at this time.  All issues noted in this document were discussed and addressed.  A limited physical exam was performed with this format.  A verbal consent was obtained for the virtual visit.   Date:  10/06/2020   ID:  [name], DOB Oct 19, 1965, MRN [id number]  Patient Location: Home Provider Location: Office/Clinic  PCP:  Rochel Brome, MD   Evaluation Performed:  acute Chief Complaint: covid 19 positive  History of Present Illness:    [name] is a 55 y.o. female with cough, chest congestion.  Sinuses have improve. Coughing is terrible. Chest congestion. Some wheezing.  No chest pain or sob. No nausea and vomiting. Diarrhea, sore throat and headache resolved.  Patient tested positive for strep and COVID-19 on October 01, 2020.  She was given amoxicillin and has 4 days left.  She is improved but she is concerned because she has chest congestion.  She is using her albuterol inhaler perhaps once daily.  She is not taking her Symbicort as she had run out.  She is a smoker. Discussed 30 yo nephew, who is currently admitted to [facility] due to covid 98 complicated by a brain abscess.  The patient does have symptoms concerning for COVID-19 infection (fever, chills, cough, or new shortness of breath).    Past Medical History:  Diagnosis Date  . Allergic rhinitis   . Arthritis   . Blood transfusion without reported diagnosis   . COPD (chronic obstructive pulmonary disease) ([place])   . GERD (gastroesophageal reflux disease)   . History of colon polyps    . HTN (hypertension)   . Hyperlipidemia   . Kidney stones   . Pancreatitis   . Pneumonia   . Psychotic depression ([place])   . Shingles February 26th, 2001    Past Surgical History:  Procedure Laterality Date  . ABDOMINAL HYSTERECTOMY    . CESAREAN SECTION    . COLONOSCOPY  05/02/2017   Colonic polyp status post polypectomy. Small internal hemorrhoids  . GALLBLADDER SURGERY    . REPLACEMENT TOTAL KNEE Right 02/09/2019  . TOTAL KNEE ARTHROPLASTY  02/09/2019  . TUBAL LIGATION      Family History  Problem Relation Age of Onset  . COPD Mother   . Parkinson's disease Mother   . COPD Father   . Arthritis Father   . Lung disease Father   . Colon cancer Cousin        mother's cousin  . Breast cancer Maternal Aunt        great aunt  . Diabetes Other   . Stroke Other   . Hypertension Other   . Hyperlipidemia Other   . Asthma Other   . Heart failure Other   . Thyroid disease Other   . Heart attack Other   . COPD Other   . Arrhythmia Other   . Arthritis Other   . Migraines Other     Social History   Socioeconomic History  . Marital status: Widowed    Spouse name: Not on file  . Number of children: 2  . Years  of education: Not on file  . Highest education level: Not on file  Occupational History  . Not on file  Tobacco Use  . Smoking status: Current Every Day Smoker    Packs/day: 1.00    Types: Cigarettes  . Smokeless tobacco: Never Used  . Tobacco comment: smokes a pack and a half daily 11.25.19  Vaping Use  . Vaping Use: Never used  Substance and Sexual Activity  . Alcohol use: Yes  . Drug use: No  . Sexual activity: Not on file  Other Topics Concern  . Not on file  Social History Narrative  . Not on file   Social Determinants of Health   Financial Resource Strain: Not on file  Food Insecurity: Not on file  Transportation Needs: Not on file  Physical Activity: Not on file  Stress: Not on file  Social Connections: Not on file  Intimate Partner  Violence: Not on file    Outpatient Medications Prior to Visit  Medication Sig Dispense Refill  . albuterol (PROVENTIL) (2.5 MG/3ML) 0.083% nebulizer solution as needed.   6  . ALPRAZolam (XANAX) 0.25 MG tablet TAKE 1 TABLET(0.25 MG) BY MOUTH DAILY AS NEEDED FOR ANXIETY 30 tablet 1  . amLODipine (NORVASC) 10 MG tablet TAKE 1 TABLET(10 MG) BY MOUTH DAILY 90 tablet 1  . atorvastatin (LIPITOR) 20 MG tablet Take 1 tablet (20 mg total) by mouth daily. 30 tablet 0  . dicyclomine (BENTYL) 10 MG capsule Take 1 capsule (10 mg total) by mouth 4 (four) times daily -  before meals and at bedtime. 120 capsule 2  . methocarbamol (ROBAXIN) 500 MG tablet Take 1 tablet (500 mg total) by mouth 3 (three) times daily. 42 tablet 0  . montelukast (SINGULAIR) 10 MG tablet TAKE 1 TABLET(10 MG) BY MOUTH DAILY 90 tablet 3  . Multiple Vitamins-Minerals (50+ ADULT EYE HEALTH PO) Take 1 tablet by mouth daily.    . nicotine (NICODERM CQ - DOSED IN MG/24 HOURS) 21 mg/24hr patch Place 1 patch (21 mg total) onto the skin daily. 28 patch 0  . nitroGLYCERIN (NITROSTAT) 0.4 MG SL tablet nitroglycerin 0.4 mg sublingual tablet  DISSOLVE TNT Q 5 MINUTES    . nystatin (MYCOSTATIN) 100000 UNIT/ML suspension Take 5 mLs (500,000 Units total) by mouth 4 (four) times daily. 400 mL 0  . topiramate (TOPAMAX) 50 MG tablet Take 50 mg by mouth daily.   0  . valACYclovir (VALTREX) 500 MG tablet Take 1 tablet (500 mg total) by mouth 3 (three) times daily. 21 tablet 0  . vitamin E 180 MG (400 UNITS) capsule Take by mouth.    Marland Kitchen albuterol (VENTOLIN HFA) 108 (90 Base) MCG/ACT inhaler Inhale 2 puffs into the lungs every 6 (six) hours as needed for wheezing or shortness of breath. 8 g 5  . amoxicillin (AMOXIL) 875 MG tablet Take 1 tablet (875 mg total) by mouth 2 (two) times daily. 20 tablet 0  . budesonide-formoterol (SYMBICORT) 160-4.5 MCG/ACT inhaler Inhale 2 puffs into the lungs 2 (two) times daily. 1 each 3   No facility-administered  medications prior to visit.    Allergies:   Levofloxacin, Levaquin [levofloxacin in d5w], Tape, Codeine, and Other   Social History   Tobacco Use  . Smoking status: Current Every Day Smoker    Packs/day: 1.00    Types: Cigarettes  . Smokeless tobacco: Never Used  . Tobacco comment: smokes a pack and a half daily 11.25.19  Vaping Use  . Vaping Use: Never used  Substance Use Topics  . Alcohol use: Yes  . Drug use: No     Review of Systems  Constitutional: Positive for malaise/fatigue. Negative for chills and fever.  HENT: Negative for ear pain, sinus pain and sore throat.   Respiratory: Positive for cough. Negative for shortness of breath.   Cardiovascular: Negative for chest pain.  Musculoskeletal: Negative for myalgias.  Neurological: Negative for headaches.     Labs/Other Tests and Data Reviewed:    Recent Labs: 01/01/2020: ALT 21; BUN 9; Creatinine, Ser 0.54; Hemoglobin 14.4; Platelets 298; Potassium 4.0; Sodium 142; TSH 2.900   Recent Lipid Panel No results found for: CHOL, TRIG, HDL, CHOLHDL, LDLCALC, LDLDIRECT  Wt Readings from Last 3 Encounters:  10/01/20 184 lb (83.5 kg)  05/28/20 185 lb (83.9 kg)  04/08/20 185 lb 9.6 oz (84.2 kg)     Objective:    Vital Signs:  BP 134/82   Pulse 93   Temp 97.6 F (36.4 C)   LMP  (LMP Unknown) Comment: hysterectomy 2008  [phone]  Physical Exam  Patient appears tired. Coughing during visit.   ASSESSMENT & PLAN:   1. Acute bronchitis due to COVID-19 virus Recommend rest and fluids. Eat 3 meals per day.  - predniSONE (DELTASONE) 50 MG tablet; Take 1 tablet (50 mg total) by mouth daily with breakfast.  Dispense: 5 tablet; Refill: 0 - amoxicillin-clavulanate (AUGMENTIN) 875-125 MG tablet; Take 1 tablet by mouth 2 (two) times daily.  Dispense: 20 tablet; Refill: 0 - albuterol (VENTOLIN HFA) 108 (90 Base) MCG/ACT inhaler; Inhale 2 puffs into the lungs every 6 (six) hours as needed for wheezing or shortness  of breath.  Dispense: 8 g; Refill: 5  2. COPD exacerbation (HCC) - budesonide-formoterol (SYMBICORT) 160-4.5 MCG/ACT inhaler; Inhale 2 puffs into the lungs 2 (two) times daily.  Dispense: 1 each; Refill: 3 - predniSONE (DELTASONE) 50 MG tablet; Take 1 tablet (50 mg total) by mouth daily with breakfast.  Dispense: 5 tablet; Refill: 0 - amoxicillin-clavulanate (AUGMENTIN) 875-125 MG tablet; Take 1 tablet by mouth 2 (two) times daily.  Dispense: 20 tablet; Refill: 0 - albuterol (VENTOLIN HFA) 108 (90 Base) MCG/ACT inhaler; Inhale 2 puffs into the lungs every 6 (six) hours as needed for wheezing or shortness of breath.  Dispense: 8 g; Refill: 5  3. Strep pharyngitis - amoxicillin-clavulanate (AUGMENTIN) 875-125 MG tablet; Take 1 tablet by mouth 2 (two) times daily.  Dispense: 20 tablet; Refill: 0    No orders of the defined types were placed in this encounter.    Meds ordered this encounter  Medications  . budesonide-formoterol (SYMBICORT) 160-4.5 MCG/ACT inhaler    Sig: Inhale 2 puffs into the lungs 2 (two) times daily.    Dispense:  1 each    Refill:  3    Please put on file  . predniSONE (DELTASONE) 50 MG tablet    Sig: Take 1 tablet (50 mg total) by mouth daily with breakfast.    Dispense:  5 tablet    Refill:  0  . amoxicillin-clavulanate (AUGMENTIN) 875-125 MG tablet    Sig: Take 1 tablet by mouth 2 (two) times daily.    Dispense:  20 tablet    Refill:  0  . albuterol (VENTOLIN HFA) 108 (90 Base) MCG/ACT inhaler    Sig: Inhale 2 puffs into the lungs every 6 (six) hours as needed for wheezing or shortness of breath.    Dispense:  8 g    Refill:  5  COVID-19 Education: The signs and symptoms of COVID-19 were discussed with the patient and how to seek care for testing (follow up with PCP or arrange E-visit). The importance of social distancing was discussed today.  Time: spent 15 minutes.   Follow Up:  In Person prn  Signed, Rochel Brome, MD  10/06/2020 9:56 AM    [place]

## 2020-10-07 ENCOUNTER — Ambulatory Visit (INDEPENDENT_AMBULATORY_CARE_PROVIDER_SITE_OTHER): Payer: 59

## 2020-10-07 ENCOUNTER — Encounter: Payer: Self-pay | Admitting: Family Medicine

## 2020-10-07 DIAGNOSIS — R0981 Nasal congestion: Secondary | ICD-10-CM | POA: Diagnosis not present

## 2020-10-07 DIAGNOSIS — R059 Cough, unspecified: Secondary | ICD-10-CM

## 2020-10-07 LAB — POC COVID19 BINAXNOW: SARS Coronavirus 2 Ag: POSITIVE — AB

## 2020-10-07 NOTE — Progress Notes (Signed)
Pt came to office to be retested per employer request. Pt still coughing and with nasal congestion. Pt tested + Covid. Pt advised to quarantine for 4 more days for a total of 10 days. Given note stating pt is positive, also told to reach back out to employer for their protocol. Pt stated she would have boyfriend pick up her medications sent in yesterday by Dr. Tobie Poet and start those. She is struggling not being able to smoke but when smokes she coughs. If she needs anything else she will give the clinic a call.

## 2020-10-16 ENCOUNTER — Other Ambulatory Visit: Payer: Self-pay | Admitting: Family Medicine

## 2020-10-16 DIAGNOSIS — U071 COVID-19: Secondary | ICD-10-CM

## 2020-10-16 DIAGNOSIS — J208 Acute bronchitis due to other specified organisms: Secondary | ICD-10-CM

## 2020-10-16 DIAGNOSIS — J441 Chronic obstructive pulmonary disease with (acute) exacerbation: Secondary | ICD-10-CM

## 2020-10-16 DIAGNOSIS — J020 Streptococcal pharyngitis: Secondary | ICD-10-CM

## 2020-10-22 ENCOUNTER — Other Ambulatory Visit: Payer: Self-pay | Admitting: Family Medicine

## 2020-10-22 ENCOUNTER — Encounter: Payer: Self-pay | Admitting: Family Medicine

## 2020-10-26 ENCOUNTER — Other Ambulatory Visit: Payer: Self-pay | Admitting: Family Medicine

## 2020-10-27 ENCOUNTER — Other Ambulatory Visit: Payer: Self-pay | Admitting: Family Medicine

## 2020-10-27 ENCOUNTER — Ambulatory Visit: Payer: BC Managed Care – PPO | Admitting: Family Medicine

## 2020-10-27 DIAGNOSIS — I10 Essential (primary) hypertension: Secondary | ICD-10-CM

## 2020-10-27 DIAGNOSIS — E782 Mixed hyperlipidemia: Secondary | ICD-10-CM

## 2020-10-28 ENCOUNTER — Other Ambulatory Visit: Payer: Self-pay

## 2020-10-28 ENCOUNTER — Other Ambulatory Visit: Payer: 59

## 2020-10-28 ENCOUNTER — Other Ambulatory Visit: Payer: Self-pay | Admitting: Family Medicine

## 2020-10-28 DIAGNOSIS — E782 Mixed hyperlipidemia: Secondary | ICD-10-CM

## 2020-10-28 DIAGNOSIS — I10 Essential (primary) hypertension: Secondary | ICD-10-CM

## 2020-10-29 ENCOUNTER — Other Ambulatory Visit: Payer: Self-pay | Admitting: Family Medicine

## 2020-10-29 LAB — LIPID PANEL
Chol/HDL Ratio: 4.4 ratio (ref 0.0–4.4)
Cholesterol, Total: 207 mg/dL — ABNORMAL HIGH (ref 100–199)
HDL: 47 mg/dL (ref >39)
LDL Chol Calc (NIH): 131 mg/dL — ABNORMAL HIGH (ref 0–99)
Triglycerides: 163 mg/dL — ABNORMAL HIGH (ref 0–149)
VLDL Cholesterol Cal: 29 mg/dL (ref 5–40)

## 2020-10-29 LAB — COMPREHENSIVE METABOLIC PANEL
ALT: 38 IU/L — ABNORMAL HIGH (ref 0–32)
AST: 24 IU/L (ref 0–40)
Albumin/Globulin Ratio: 2.5 — ABNORMAL HIGH (ref 1.2–2.2)
Albumin: 4.7 g/dL (ref 3.8–4.9)
Alkaline Phosphatase: 101 IU/L (ref 44–121)
BUN/Creatinine Ratio: 18 (ref 9–23)
BUN: 10 mg/dL (ref 6–24)
Bilirubin Total: 0.2 mg/dL (ref 0.0–1.2)
CO2: 23 mmol/L (ref 20–29)
Calcium: 9.7 mg/dL (ref 8.7–10.2)
Chloride: 102 mmol/L (ref 96–106)
Creatinine, Ser: 0.55 mg/dL — ABNORMAL LOW (ref 0.57–1.00)
GFR calc Af Amer: 122 mL/min/{1.73_m2} (ref >59)
GFR calc non Af Amer: 106 mL/min/{1.73_m2} (ref >59)
Globulin, Total: 1.9 g/dL (ref 1.5–4.5)
Glucose: 111 mg/dL — ABNORMAL HIGH (ref 65–99)
Potassium: 4.6 mmol/L (ref 3.5–5.2)
Sodium: 140 mmol/L (ref 134–144)
Total Protein: 6.6 g/dL (ref 6.0–8.5)

## 2020-10-29 LAB — TSH: TSH: 2.82 u[IU]/mL (ref 0.450–4.500)

## 2020-10-29 LAB — CBC WITH DIFFERENTIAL/PLATELET
Basophils Absolute: 0 10*3/uL (ref 0.0–0.2)
Basos: 1 %
EOS (ABSOLUTE): 0.3 10*3/uL (ref 0.0–0.4)
Eos: 4 %
Hematocrit: 42.6 % (ref 34.0–46.6)
Hemoglobin: 14.1 g/dL (ref 11.1–15.9)
Immature Grans (Abs): 0 10*3/uL (ref 0.0–0.1)
Immature Granulocytes: 0 %
Lymphocytes Absolute: 2 10*3/uL (ref 0.7–3.1)
Lymphs: 29 %
MCH: 30.9 pg (ref 26.6–33.0)
MCHC: 33.1 g/dL (ref 31.5–35.7)
MCV: 93 fL (ref 79–97)
Monocytes Absolute: 0.5 10*3/uL (ref 0.1–0.9)
Monocytes: 7 %
Neutrophils Absolute: 4.2 10*3/uL (ref 1.4–7.0)
Neutrophils: 59 %
Platelets: 278 10*3/uL (ref 150–450)
RBC: 4.57 x10E6/uL (ref 3.77–5.28)
RDW: 11.8 % (ref 11.7–15.4)
WBC: 7 10*3/uL (ref 3.4–10.8)

## 2020-10-29 LAB — HM PAP SMEAR: HM Pap smear: NEGATIVE

## 2020-10-29 LAB — CARDIOVASCULAR RISK ASSESSMENT

## 2020-10-29 MED ORDER — ATORVASTATIN CALCIUM 40 MG PO TABS: 40.0000 mg | ORAL_TABLET | Freq: Every day | ORAL | 0 refills | Status: DC

## 2020-10-31 LAB — HGB A1C W/O EAG: Hgb A1c MFr Bld: 5.7 % — ABNORMAL HIGH (ref 4.8–5.6)

## 2020-10-31 LAB — SPECIMEN STATUS REPORT

## 2020-12-08 ENCOUNTER — Telehealth: Payer: Self-pay

## 2020-12-08 NOTE — Telephone Encounter (Signed)
error 

## 2020-12-11 NOTE — Progress Notes (Signed)
Subjective:  Patient ID: [name], female    DOB: [birth year]  Age: 55 y.o. MRN: [id number]  Chief Complaint  Patient presents with  . Edema    HPI C/o of leg swelling. Sits alot due to work and unable to elevate her legs. Swelling started happening after have TKR in May of 2020.  Took ntg a couple of weeks ago, but is concerned hers is old.  Cardiologist, Sharolyn Douglas MD with [facility] sees him regularly.   Migraines: Take topiramate 50 mg one twice a day.   Dr. Arnoldo Morale, [facility] neurosurgery, put on gabapentin 300 mg one three times a day. Pt has left sciatica. Flexeril 5mg  one three times a day prn muscle pain.   Hyperlipidemia: on lipitor 40 mg once daily.   On asthma: Uses symbicort sporadically.  Uses a humidifer.no coughing or breathing problems.   Tobacco Use: 1 PPD. Uses nicoderm patches sporadically. Has not tried wellbutrin xl.   Current Outpatient Medications on File Prior to Visit  Medication Sig Dispense Refill  . albuterol (PROVENTIL) (2.5 MG/3ML) 0.083% nebulizer solution as needed.   6  . albuterol (VENTOLIN HFA) 108 (90 Base) MCG/ACT inhaler Inhale 2 puffs into the lungs every 6 (six) hours as needed for wheezing or shortness of breath. 8 g 5  . amLODipine (NORVASC) 10 MG tablet TAKE 1 TABLET(10 MG) BY MOUTH DAILY 90 tablet 1  . atorvastatin (LIPITOR) 40 MG tablet Take 1 tablet (40 mg total) by mouth daily. 90 tablet 0  . budesonide-formoterol (SYMBICORT) 160-4.5 MCG/ACT inhaler Inhale 2 puffs into the lungs 2 (two) times daily. 1 each 3  . dicyclomine (BENTYL) 10 MG capsule Take 1 capsule (10 mg total) by mouth 4 (four) times daily -  before meals and at bedtime. 120 capsule 2  . montelukast (SINGULAIR) 10 MG tablet TAKE 1 TABLET(10 MG) BY MOUTH DAILY 90 tablet 3  . Multiple Vitamins-Minerals (50+ ADULT EYE HEALTH PO) Take 1 tablet by mouth daily.    . nicotine (NICODERM CQ - DOSED IN MG/24 HOURS) 21 mg/24hr patch APPLY 1 PATCH(21 MG) TOPICALLY TO THE  SKIN DAILY 28 patch 0  . nystatin (MYCOSTATIN) 100000 UNIT/ML suspension Take 5 mLs (500,000 Units total) by mouth 4 (four) times daily. 400 mL 0  . topiramate (TOPAMAX) 50 MG tablet Take 50 mg by mouth daily.   0  . valACYclovir (VALTREX) 500 MG tablet Take 1 tablet (500 mg total) by mouth 3 (three) times daily. 21 tablet 0  . vitamin E 180 MG (400 UNITS) capsule Take by mouth.     No current facility-administered medications on file prior to visit.   Past Medical History:  Diagnosis Date  . Allergic rhinitis   . Arthritis   . Blood transfusion without reported diagnosis   . COPD (chronic obstructive pulmonary disease) ([place])   . GERD (gastroesophageal reflux disease)   . History of colon polyps   . HTN (hypertension)   . Hyperlipidemia   . Kidney stones   . Pancreatitis   . Pneumonia   . Psychotic depression ([place])   . Shingles February 26th, 2001   Past Surgical History:  Procedure Laterality Date  . ABDOMINAL HYSTERECTOMY    . CESAREAN SECTION    . COLONOSCOPY  05/02/2017   Colonic polyp status post polypectomy. Small internal hemorrhoids  . GALLBLADDER SURGERY    . REPLACEMENT TOTAL KNEE Right 02/09/2019  . TOTAL KNEE ARTHROPLASTY  02/09/2019  . TUBAL LIGATION  Family History  Problem Relation Age of Onset  . COPD Mother   . Parkinson's disease Mother   . COPD Father   . Arthritis Father   . Lung disease Father   . Colon cancer Cousin        mother's cousin  . Breast cancer Maternal Aunt        great aunt  . Diabetes Other   . Stroke Other   . Hypertension Other   . Hyperlipidemia Other   . Asthma Other   . Heart failure Other   . Thyroid disease Other   . Heart attack Other   . COPD Other   . Arrhythmia Other   . Arthritis Other   . Migraines Other    Social History   Socioeconomic History  . Marital status: Widowed    Spouse name: Not on file  . Number of children: 2  . Years of education: Not on file  . Highest education level: Not on file   Occupational History  . Not on file  Tobacco Use  . Smoking status: Current Every Day Smoker    Packs/day: 1.00    Types: Cigarettes  . Smokeless tobacco: Never Used  . Tobacco comment: smokes a pack and a half daily 11.25.19  Vaping Use  . Vaping Use: Never used  Substance and Sexual Activity  . Alcohol use: Yes  . Drug use: No  . Sexual activity: Not on file  Other Topics Concern  . Not on file  Social History Narrative  . Not on file   Social Determinants of Health   Financial Resource Strain: Not on file  Food Insecurity: Not on file  Transportation Needs: Not on file  Physical Activity: Not on file  Stress: Not on file  Social Connections: Not on file    Review of Systems  Constitutional: Negative for chills, fatigue and fever.  HENT: Positive for congestion and ear pain. Negative for rhinorrhea and sore throat.   Respiratory: Negative for cough and shortness of breath.   Cardiovascular: Positive for chest pain.  Gastrointestinal: Positive for constipation. Negative for abdominal pain, diarrhea, nausea and vomiting.  Genitourinary: Negative for dysuria and urgency.  Musculoskeletal: Negative for back pain and myalgias.  Allergic/Immunologic: Positive for environmental allergies.     Objective:  BP 112/78   Pulse 79   Temp (!) 97.3 F (36.3 C)   Ht 5\' 7"  (1.702 m)   Wt 193 lb (87.5 kg)   LMP  (LMP Unknown) Comment: hysterectomy 2008  SpO2 100%   BMI 30.23 kg/m   BP/Weight 12/12/2020 10/06/2020 02/14/7823  Systolic BP 235 361 -  Diastolic BP 78 82 -  Wt. (Lbs) 193 - 184  BMI 30.23 - 28.82    Physical Exam Vitals reviewed.  Constitutional:      Appearance: Normal appearance. She is normal weight.  Neck:     Vascular: No carotid bruit.  Cardiovascular:     Rate and Rhythm: Normal rate and regular rhythm.     Pulses: Normal pulses.     Heart sounds: Normal heart sounds.  Pulmonary:     Effort: Pulmonary effort is normal. No respiratory distress.      Breath sounds: Normal breath sounds.  Abdominal:     General: Abdomen is flat. Bowel sounds are normal.     Palpations: Abdomen is soft.     Tenderness: There is no abdominal tenderness.  Musculoskeletal:     Right lower leg: Edema (1 +) present.  Left lower leg: Edema (ttrace) present.  Neurological:     Mental Status: She is alert and oriented to person, place, and time.  Psychiatric:        Mood and Affect: Mood normal.        Behavior: Behavior normal.    Lab Results  Component Value Date   WBC 7.0 10/28/2020   HGB 14.1 10/28/2020   HCT 42.6 10/28/2020   PLT 278 10/28/2020   GLUCOSE 111 (H) 10/28/2020   CHOL 207 (H) 10/28/2020   TRIG 163 (H) 10/28/2020   HDL 47 10/28/2020   LDLCALC 131 (H) 10/28/2020   ALT 38 (H) 10/28/2020   AST 24 10/28/2020   NA 140 10/28/2020   K 4.6 10/28/2020   CL 102 10/28/2020   CREATININE 0.55 (L) 10/28/2020   BUN 10 10/28/2020   CO2 23 10/28/2020   TSH 2.820 10/28/2020   INR 0.9 01/01/2020   HGBA1C 5.7 (H) 10/28/2020      Assessment & Plan:   1. Mixed hyperlipidemia Not at goal  No changes to medicines.  Continue to work on eating a healthy diet and exercise.  Labs reviewed from 10/28/2020..   2. Primary hypertension Well controlled.  No changes to medicines.  Continue to work on eating a healthy diet and exercise.  Labsreviewed today.   3. Cigarette nicotine dependence with nicotine-induced disorder Recommend cessation  4. Moderate persistent asthma without complication The current medical regimen is effective;  continue present plan and medications.  5. Pedal edema Start lasix 20 mg once daily.   6. Left sided sciatica Follow up with dr. Arnoldo Morale.  7. Other migraine without status migrainosus, not intractable  Continue topiramate.  Meds ordered this encounter  Medications  . furosemide (LASIX) 20 MG tablet    Sig: Take 1 tablet (20 mg total) by mouth daily.    Dispense:  30 tablet    Refill:  3  .  buPROPion (WELLBUTRIN XL) 150 MG 24 hr tablet    Sig: Once in am x 1 week, then increase to 2 in am    Dispense:  60 tablet    Refill:  0  . nitroGLYCERIN (NITROSTAT) 0.4 MG SL tablet    Sig: nitroglycerin 0.4 mg sublingual tablet  DISSOLVE TNT Q 5 MINUTES x 3    Dispense:  25 tablet    Refill:  0  . cyclobenzaprine (FLEXERIL) 5 MG tablet    Sig: Take 1 tablet (5 mg total) by mouth 3 (three) times daily as needed for muscle spasms.    Dispense:  90 tablet    Refill:  0  . ALPRAZolam (XANAX) 0.25 MG tablet    Sig: TAKE 1 TABLET(0.25 MG) BY MOUTH DAILY AS NEEDED FOR ANXIETY    Dispense:  30 tablet    Refill:  1  . fluticasone (FLONASE) 50 MCG/ACT nasal spray    Sig: Place 2 sprays into both nostrils daily.    Dispense:  16 g    Refill:  6   Follow-up: Return in about 8 weeks (around 02/08/2021) for fasting.  An After Visit Summary was printed and given to the patient.  Rochel Brome, MD [facility] [phone]

## 2020-12-12 ENCOUNTER — Ambulatory Visit: Payer: 59 | Admitting: Family Medicine

## 2020-12-12 ENCOUNTER — Other Ambulatory Visit: Payer: Self-pay

## 2020-12-12 ENCOUNTER — Ambulatory Visit (INDEPENDENT_AMBULATORY_CARE_PROVIDER_SITE_OTHER): Payer: 59 | Admitting: Family Medicine

## 2020-12-12 VITALS — BP 112/78 | HR 79 | Temp 97.3°F | Ht 67.0 in | Wt 193.0 lb

## 2020-12-12 DIAGNOSIS — I10 Essential (primary) hypertension: Secondary | ICD-10-CM | POA: Diagnosis not present

## 2020-12-12 DIAGNOSIS — J4540 Moderate persistent asthma, uncomplicated: Secondary | ICD-10-CM

## 2020-12-12 DIAGNOSIS — F17219 Nicotine dependence, cigarettes, with unspecified nicotine-induced disorders: Secondary | ICD-10-CM

## 2020-12-12 DIAGNOSIS — G43809 Other migraine, not intractable, without status migrainosus: Secondary | ICD-10-CM

## 2020-12-12 DIAGNOSIS — E782 Mixed hyperlipidemia: Secondary | ICD-10-CM | POA: Diagnosis not present

## 2020-12-12 DIAGNOSIS — R600 Localized edema: Secondary | ICD-10-CM

## 2020-12-12 DIAGNOSIS — M5432 Sciatica, left side: Secondary | ICD-10-CM

## 2020-12-12 MED ORDER — NITROGLYCERIN 0.4 MG SL SUBL: SUBLINGUAL_TABLET | SUBLINGUAL | 0 refills | Status: DC

## 2020-12-12 MED ORDER — ALPRAZOLAM 0.25 MG PO TABS: ORAL_TABLET | ORAL | 1 refills | Status: DC

## 2020-12-12 MED ORDER — BUPROPION HCL ER (XL) 150 MG PO TB24: ORAL_TABLET | ORAL | 0 refills | Status: DC

## 2020-12-12 MED ORDER — CYCLOBENZAPRINE HCL 5 MG PO TABS: 5.0000 mg | ORAL_TABLET | Freq: Three times a day (TID) | ORAL | 0 refills | Status: DC | PRN

## 2020-12-12 MED ORDER — FLUTICASONE PROPIONATE 50 MCG/ACT NA SUSP: 2.0000 | Freq: Every day | NASAL | 6 refills | Status: DC

## 2020-12-12 MED ORDER — FUROSEMIDE 20 MG PO TABS: 20.0000 mg | ORAL_TABLET | Freq: Every day | ORAL | 3 refills | Status: DC

## 2020-12-16 ENCOUNTER — Encounter: Payer: Self-pay | Admitting: Family Medicine

## 2020-12-17 ENCOUNTER — Other Ambulatory Visit: Payer: Self-pay | Admitting: Family Medicine

## 2020-12-21 ENCOUNTER — Other Ambulatory Visit: Payer: Self-pay | Admitting: Family Medicine

## 2020-12-23 ENCOUNTER — Other Ambulatory Visit: Payer: Self-pay | Admitting: Physician Assistant

## 2021-01-08 ENCOUNTER — Other Ambulatory Visit: Payer: Self-pay | Admitting: Family Medicine

## 2021-01-20 ENCOUNTER — Other Ambulatory Visit: Payer: Self-pay | Admitting: Family Medicine

## 2021-03-28 ENCOUNTER — Other Ambulatory Visit: Payer: Self-pay | Admitting: Physician Assistant

## 2021-04-06 ENCOUNTER — Telehealth: Payer: Self-pay | Admitting: Gastroenterology

## 2021-04-06 MED ORDER — PANTOPRAZOLE SODIUM 40 MG PO TBEC: 40.0000 mg | DELAYED_RELEASE_TABLET | Freq: Every day | ORAL | 1 refills | Status: DC

## 2021-04-06 NOTE — Telephone Encounter (Signed)
Medication sent. Patient said she lost her job and that she cant afford to have an office visit since she only get 150 a week.  I told her that she can have 6 month supply and to call in 6 months to see about an office or mychart visit.

## 2021-04-06 NOTE — Telephone Encounter (Signed)
Inbound call from patient requesting Protonix script be changed to once daily instead of twice and is needing additional refills for it to be sent to pharmacy please.

## 2021-04-14 ENCOUNTER — Telehealth (INDEPENDENT_AMBULATORY_CARE_PROVIDER_SITE_OTHER): Payer: 59 | Admitting: Nurse Practitioner

## 2021-04-14 ENCOUNTER — Other Ambulatory Visit: Payer: Self-pay | Admitting: Family Medicine

## 2021-04-14 ENCOUNTER — Encounter: Payer: Self-pay | Admitting: Nurse Practitioner

## 2021-04-14 ENCOUNTER — Telehealth: Payer: 59 | Admitting: Nurse Practitioner

## 2021-04-14 VITALS — Ht 67.0 in | Wt 193.0 lb

## 2021-04-14 DIAGNOSIS — J0110 Acute frontal sinusitis, unspecified: Secondary | ICD-10-CM

## 2021-04-14 MED ORDER — AMOXICILLIN-POT CLAVULANATE 875-125 MG PO TABS: 1.0000 | ORAL_TABLET | Freq: Two times a day (BID) | ORAL | 0 refills | Status: DC

## 2021-04-14 NOTE — Progress Notes (Signed)
Virtual Visit via Video Note   This visit type was conducted due to national recommendations for restrictions regarding the COVID-19 Pandemic (e.g. social distancing) in an effort to limit this patient's exposure and mitigate transmission in our community.  Due to her co-morbid illnesses, this patient is at least at moderate risk for complications without adequate follow up.  This format is felt to be most appropriate for this patient at this time.  All issues noted in this document were discussed and addressed.  A limited physical exam was performed with this format.  A verbal consent was obtained for the virtual visit.   Date:  04/14/2021   ID:  [name], DOB 07-20-1966, MRN [id number]  Patient Location: Home Provider Location: Office/Clinic  PCP:  Rochel Brome, MD   Evaluation Performed:  Follow-Up Visit  Chief Complaint:  sinus pressure  History of Present Illness:    [name] is a 55 y.o. female with sinus pain/pressure, dizziness, frontal sinus tenderness, and bilateral eye pressure/pain.Onset of symptoms was 1-week ago. Treatment has included Alka Seltzer Cold, Sudafed PE, Flonase, and Singulair. She tells me that she has a cracked tooth that she is scheduled to have repaired in Sept 13th 2022. She recently had a course of Amoxicillin prescribed by her dentist. She is a long-time cigarette smoker. Denies dyspnea, fever, or productive cough.   The patient does have symptoms concerning for COVID-19 infection (fever, chills, cough, or new shortness of breath).    Past Medical History:  Diagnosis Date   Allergic rhinitis    Arthritis    Blood transfusion without reported diagnosis    COPD (chronic obstructive pulmonary disease) ([facility])    GERD (gastroesophageal reflux disease)    History of colon polyps    HTN (hypertension)    Hyperlipidemia    Kidney stones    Pancreatitis    Pneumonia    Psychotic depression [facility])    Shingles February 26th, 2001    Past  Surgical History:  Procedure Laterality Date   ABDOMINAL HYSTERECTOMY     CESAREAN SECTION     COLONOSCOPY  05/02/2017   Colonic polyp status post polypectomy. Small internal hemorrhoids   GALLBLADDER SURGERY     REPLACEMENT TOTAL KNEE Right 02/09/2019   TOTAL KNEE ARTHROPLASTY  02/09/2019   TUBAL LIGATION      Family History  Problem Relation Age of Onset   COPD Mother    Parkinson's disease Mother    COPD Father    Arthritis Father    Lung disease Father    Colon cancer Cousin        mother's cousin   Breast cancer Maternal Aunt        great aunt   Diabetes Other    Stroke Other    Hypertension Other    Hyperlipidemia Other    Asthma Other    Heart failure Other    Thyroid disease Other    Heart attack Other    COPD Other    Arrhythmia Other    Arthritis Other    Migraines Other     Social History   Socioeconomic History   Marital status: Widowed    Spouse name: Not on file   Number of children: 2   Years of education: Not on file   Highest education level: Not on file  Occupational History   Not on file  Tobacco Use   Smoking status: Every Day    Packs/day: 1.00    Types:  Cigarettes   Smokeless tobacco: Never   Tobacco comments:    smokes a pack and a half daily 11.25.19  Vaping Use   Vaping Use: Never used  Substance and Sexual Activity   Alcohol use: Yes   Drug use: No   Sexual activity: Not on file  Other Topics Concern   Not on file  Social History Narrative   Not on file   Social Determinants of Health   Financial Resource Strain: Not on file  Food Insecurity: Not on file  Transportation Needs: Not on file  Physical Activity: Not on file  Stress: Not on file  Social Connections: Not on file  Intimate Partner Violence: Not on file    Outpatient Medications Prior to Visit  Medication Sig Dispense Refill   albuterol (PROVENTIL) (2.5 MG/3ML) 0.083% nebulizer solution as needed.   6   albuterol (VENTOLIN HFA) 108 (90 Base) MCG/ACT  inhaler Inhale 2 puffs into the lungs every 6 (six) hours as needed for wheezing or shortness of breath. 8 g 5   ALPRAZolam (XANAX) 0.25 MG tablet TAKE 1 TABLET(0.25 MG) BY MOUTH DAILY AS NEEDED FOR ANXIETY 30 tablet 1   amLODipine (NORVASC) 10 MG tablet TAKE 1 TABLET(10 MG) BY MOUTH DAILY 90 tablet 0   amoxicillin (AMOXIL) 500 MG capsule Take 500 mg by mouth 2 (two) times daily.     atorvastatin (LIPITOR) 40 MG tablet TAKE 1 TABLET(40 MG) BY MOUTH DAILY 90 tablet 0   budesonide-formoterol (SYMBICORT) 160-4.5 MCG/ACT inhaler Inhale 2 puffs into the lungs 2 (two) times daily. 1 each 3   buPROPion (WELLBUTRIN XL) 300 MG 24 hr tablet Take 1 tablet (300 mg total) by mouth daily. 90 tablet 1   cyclobenzaprine (FLEXERIL) 5 MG tablet Take 1 tablet (5 mg total) by mouth 3 (three) times daily as needed for muscle spasms. 90 tablet 0   dicyclomine (BENTYL) 10 MG capsule Take 1 capsule (10 mg total) by mouth 4 (four) times daily -  before meals and at bedtime. 120 capsule 2   fluticasone (FLONASE) 50 MCG/ACT nasal spray Place 2 sprays into both nostrils daily. 16 g 6   furosemide (LASIX) 20 MG tablet TAKE 1 TABLET(20 MG) BY MOUTH DAILY 30 tablet 3   gabapentin (NEURONTIN) 300 MG capsule Take 300 mg by mouth 3 (three) times daily.     montelukast (SINGULAIR) 10 MG tablet TAKE 1 TABLET(10 MG) BY MOUTH DAILY 90 tablet 3   Multiple Vitamins-Minerals (50+ ADULT EYE HEALTH PO) Take 1 tablet by mouth daily.     nicotine (NICODERM CQ - DOSED IN MG/24 HOURS) 21 mg/24hr patch APPLY 1 PATCH(21 MG) TOPICALLY TO THE SKIN DAILY 28 patch 0   nitroGLYCERIN (NITROSTAT) 0.4 MG SL tablet DISSOLVE 1 TABLET BY MOUTH UNDER THE TONGUE AS NEEDED FOR CHEST PAIN EVERY 5 MINUTES UP TO 3 TIMES 25 tablet 0   nystatin (MYCOSTATIN) 100000 UNIT/ML suspension Take 5 mLs (500,000 Units total) by mouth 4 (four) times daily. 400 mL 0   pantoprazole (PROTONIX) 40 MG tablet Take 1 tablet (40 mg total) by mouth daily. 90 tablet 1   topiramate  (TOPAMAX) 50 MG tablet Take 50 mg by mouth daily.   0   valACYclovir (VALTREX) 500 MG tablet Take 1 tablet (500 mg total) by mouth 3 (three) times daily. 21 tablet 0   vitamin E 180 MG (400 UNITS) capsule Take by mouth.     No facility-administered medications prior to visit.    Allergies:  Levofloxacin, Levaquin [levofloxacin in d5w], Tape, Codeine, and Other   Social History   Tobacco Use   Smoking status: Every Day    Packs/day: 1.00    Types: Cigarettes   Smokeless tobacco: Never   Tobacco comments:    smokes a pack and a half daily 11.25.19  Vaping Use   Vaping Use: Never used  Substance Use Topics   Alcohol use: Yes   Drug use: No     Review of Systems  Constitutional:  Negative for chills and malaise/fatigue.  HENT:  Positive for congestion and sinus pain (Facial pain). Negative for ear pain and sore throat.   Eyes:  Positive for pain (bilateral pain/pressure behind eyes). Negative for blurred vision and double vision.  Respiratory:  Positive for cough. Negative for sputum production, shortness of breath and wheezing.   Cardiovascular:  Negative for chest pain and leg swelling.  Gastrointestinal:  Negative for abdominal pain, constipation, diarrhea, heartburn, nausea and vomiting.  Genitourinary:  Negative for dysuria, frequency and urgency.  Musculoskeletal:  Negative for back pain and myalgias.  Skin: Negative.   Neurological:  Positive for dizziness and headaches.  Endo/Heme/Allergies:  Positive for environmental allergies.    Labs/Other Tests and Data Reviewed:    Recent Labs: 10/28/2020: ALT 38; BUN 10; Creatinine, Ser 0.55; Hemoglobin 14.1; Platelets 278; Potassium 4.6; Sodium 140; TSH 2.820   Recent Lipid Panel Lab Results  Component Value Date/Time   CHOL 207 (H) 10/28/2020 08:03 AM   TRIG 163 (H) 10/28/2020 08:03 AM   HDL 47 10/28/2020 08:03 AM   CHOLHDL 4.4 10/28/2020 08:03 AM   LDLCALC 131 (H) 10/28/2020 08:03 AM    Wt Readings from Last 3  Encounters:  04/14/21 193 lb (87.5 kg)  12/12/20 193 lb (87.5 kg)  10/01/20 184 lb (83.5 kg)     Objective:    Vital Signs:  Ht '5\' 7"'$  (1.702 m)   Wt 193 lb (87.5 kg)   LMP  (LMP Unknown) Comment: hysterectomy 2008  BMI 30.23 kg/m    Physical Exam No physical exam performed due to telemedicine visit  ASSESSMENT & PLAN:      1. Acute frontal sinusitis, recurrence not specified - amoxicillin-clavulanate (AUGMENTIN) 875-125 MG tablet; Take 1 tablet by mouth 2 (two) times daily.  Dispense: 20 tablet; Refill: 0    Rest and push fluids Take Augmentin as prescribed Recommend probiotics and eating yogurt to prevent yeast infection Notify office if symptoms worsen or fail to improve Follow-up as needed   Follow-up: As needed   COVID-19 Education: The signs and symptoms of COVID-19 were discussed with the patient and how to seek care for testing (follow up with PCP or arrange E-visit). The importance of social distancing was discussed today.   I spent 10 minutes dedicated to the care of this patient on the date of this encounter to include face-to-face time with the patient, as well as: EMR and prescription medication management.  Follow Up:  In Person prn   I,Lauren M Auman,acting as a scribe for [employer], NP.,have documented all relevant documentation on the behalf of Rip Harbour, NP,as directed by  Rip Harbour, NP while in the presence of Rip Harbour, NP.    I, Rip Harbour, NP, have reviewed all documentation for this visit. The documentation on 04/14/21 for the exam, diagnosis, procedures, and orders are all accurate and complete.    Signed, Jerrell Belfast, DNP 04/14/2021 3:35 PM    [facility]  [place]

## 2021-04-20 ENCOUNTER — Other Ambulatory Visit: Payer: Self-pay | Admitting: Physician Assistant

## 2021-04-20 ENCOUNTER — Other Ambulatory Visit: Payer: Self-pay | Admitting: Family Medicine

## 2021-04-27 ENCOUNTER — Other Ambulatory Visit: Payer: Self-pay | Admitting: Family Medicine

## 2021-04-28 ENCOUNTER — Other Ambulatory Visit: Payer: Self-pay | Admitting: Family Medicine

## 2021-04-28 MED ORDER — ATORVASTATIN CALCIUM 40 MG PO TABS: 40.0000 mg | ORAL_TABLET | Freq: Every day | ORAL | 0 refills | Status: DC

## 2021-06-19 ENCOUNTER — Other Ambulatory Visit: Payer: Self-pay | Admitting: Family Medicine

## 2021-07-21 ENCOUNTER — Other Ambulatory Visit: Payer: Self-pay | Admitting: Family Medicine

## 2021-07-27 ENCOUNTER — Other Ambulatory Visit: Payer: Self-pay | Admitting: Family Medicine

## 2021-08-05 ENCOUNTER — Ambulatory Visit (INDEPENDENT_AMBULATORY_CARE_PROVIDER_SITE_OTHER): Payer: 59 | Admitting: Nurse Practitioner

## 2021-08-05 ENCOUNTER — Other Ambulatory Visit: Payer: Self-pay

## 2021-08-05 ENCOUNTER — Other Ambulatory Visit: Payer: Self-pay | Admitting: Nurse Practitioner

## 2021-08-05 ENCOUNTER — Encounter: Payer: Self-pay | Admitting: Nurse Practitioner

## 2021-08-05 VITALS — BP 118/64 | HR 78 | Temp 97.3°F | Ht 67.0 in | Wt 195.0 lb

## 2021-08-05 DIAGNOSIS — J309 Allergic rhinitis, unspecified: Secondary | ICD-10-CM | POA: Diagnosis not present

## 2021-08-05 DIAGNOSIS — F411 Generalized anxiety disorder: Secondary | ICD-10-CM

## 2021-08-05 DIAGNOSIS — J0110 Acute frontal sinusitis, unspecified: Secondary | ICD-10-CM | POA: Diagnosis not present

## 2021-08-05 DIAGNOSIS — G629 Polyneuropathy, unspecified: Secondary | ICD-10-CM

## 2021-08-05 MED ORDER — GABAPENTIN 300 MG PO CAPS: 300.0000 mg | ORAL_CAPSULE | Freq: Three times a day (TID) | ORAL | 0 refills | Status: DC

## 2021-08-05 MED ORDER — FLUTICASONE PROPIONATE 50 MCG/ACT NA SUSP: 2.0000 | Freq: Every day | NASAL | 6 refills | Status: DC

## 2021-08-05 MED ORDER — ALPRAZOLAM 0.25 MG PO TABS: ORAL_TABLET | ORAL | 0 refills | Status: DC

## 2021-08-05 MED ORDER — TRIAMCINOLONE ACETONIDE 40 MG/ML IJ SUSP
60.0000 mg | Freq: Once | INTRAMUSCULAR | Status: AC
  Administered 2021-08-05: 60 mg via INTRAMUSCULAR

## 2021-08-05 MED ORDER — AMOXICILLIN-POT CLAVULANATE 875-125 MG PO TABS: 1.0000 | ORAL_TABLET | Freq: Two times a day (BID) | ORAL | 0 refills | Status: DC

## 2021-08-05 MED ORDER — TRIAMCINOLONE ACETONIDE 40 MG/ML IJ SUSP: 60.0000 mg | Freq: Once | INTRAMUSCULAR | Status: DC

## 2021-08-05 MED ORDER — MONTELUKAST SODIUM 10 MG PO TABS: 10.0000 mg | ORAL_TABLET | Freq: Every day | ORAL | 0 refills | Status: DC

## 2021-08-05 NOTE — Patient Instructions (Addendum)
Kenalog injection given in office Use Flonase nasal spray daily Take Augmentin twice daily for 10 days Fasting appointment   Sinusitis, Adult Sinusitis is soreness and swelling (inflammation) of your sinuses. Sinuses are hollow spaces in the bones around your face. They are located: Around your eyes. In the middle of your forehead. Behind your nose. In your cheekbones. Your sinuses and nasal passages are lined with a fluid called mucus. Mucus drains out of your sinuses. Swelling can trap mucus in your sinuses. This lets germs (bacteria, virus, or fungus) grow, which leads to infection. Most of the time, this condition is caused by a virus. What are the causes? This condition is caused by: Allergies. Asthma. Germs. Things that block your nose or sinuses. Growths in the nose (nasal polyps). Chemicals or irritants in the air. Fungus (rare). What increases the risk? You are more likely to develop this condition if: You have a weak body defense system (immune system). You do a lot of swimming or diving. You use nasal sprays too much. You smoke. What are the signs or symptoms? The main symptoms of this condition are pain and a feeling of pressure around the sinuses. Other symptoms include: Stuffy nose (congestion). Runny nose (drainage). Swelling and warmth in the sinuses. Headache. Toothache. A cough that may get worse at night. Mucus that collects in the throat or the back of the nose (postnasal drip). Being unable to smell and taste. Being very tired (fatigue). A fever. Sore throat. Bad breath. How is this diagnosed? This condition is diagnosed based on: Your symptoms. Your medical history. A physical exam. Tests to find out if your condition is short-term (acute) or long-term (chronic). Your doctor may: Check your nose for growths (polyps). Check your sinuses using a tool that has a light (endoscope). Check for allergies or germs. Do imaging tests, such as an MRI or  CT scan. How is this treated? Treatment for this condition depends on the cause and whether it is short-term or long-term. If caused by a virus, your symptoms should go away on their own within 10 days. You may be given medicines to relieve symptoms. They include: Medicines that shrink swollen tissue in the nose. Medicines that treat allergies (antihistamines). A spray that treats swelling of the nostrils.  Rinses that help get rid of thick mucus in your nose (nasal saline washes). If caused by bacteria, your doctor may wait to see if you will get better without treatment. You may be given antibiotic medicine if you have: A very bad infection. A weak body defense system. If caused by growths in the nose, you may need to have surgery. Follow these instructions at home: Medicines Take, use, or apply over-the-counter and prescription medicines only as told by your doctor. These may include nasal sprays. If you were prescribed an antibiotic medicine, take it as told by your doctor. Do not stop taking the antibiotic even if you start to feel better. Hydrate and humidify  Drink enough water to keep your pee (urine) pale yellow. Use a cool mist humidifier to keep the humidity level in your home above 50%. Breathe in steam for 10-15 minutes, 3-4 times a day, or as told by your doctor. You can do this in the bathroom while a hot shower is running. Try not to spend time in cool or dry air. Rest Rest as much as you can. Sleep with your head raised (elevated). Make sure you get enough sleep each night. General instructions  Put a warm, moist  washcloth on your face 3-4 times a day, or as often as told by your doctor. This will help with discomfort. Wash your hands often with soap and water. If there is no soap and water, use hand sanitizer. Do not smoke. Avoid being around people who are smoking (secondhand smoke). Keep all follow-up visits as told by your doctor. This is important. Contact a  doctor if: You have a fever. Your symptoms get worse. Your symptoms do not get better within 10 days. Get help right away if: You have a very bad headache. You cannot stop throwing up (vomiting). You have very bad pain or swelling around your face or eyes. You have trouble seeing. You feel confused. Your neck is stiff. You have trouble breathing. Summary Sinusitis is swelling of your sinuses. Sinuses are hollow spaces in the bones around your face. This condition is caused by tissues in your nose that become inflamed or swollen. This traps germs. These can lead to infection. If you were prescribed an antibiotic medicine, take it as told by your doctor. Do not stop taking it even if you start to feel better. Keep all follow-up visits as told by your doctor. This is important. This information is not intended to replace advice given to you by your health care provider. Make sure you discuss any questions you have with your health care provider. Document Revised: 02/06/2018 Document Reviewed: 02/06/2018 Elsevier Patient Education  2022 [place].     Allergic Rhinitis, Adult Allergic rhinitis is an allergic reaction that affects the mucous membrane inside the nose. The mucous membrane is the tissue that produces mucus. There are two types of allergic rhinitis: Seasonal. This type is also called hay fever and happens only during certain seasons. Perennial. This type can happen at any time of the year. Allergic rhinitis cannot be spread from person to person. This condition can be mild, moderate, or severe. It can develop at any age and may be outgrown. What are the causes? This condition is caused by allergens. These are things that can cause an allergic reaction. Allergens may differ for seasonal allergic rhinitis and perennial allergic rhinitis. Seasonal allergic rhinitis is triggered by pollen. Pollen can come from grasses, trees, and weeds. Perennial allergic rhinitis may be  triggered by: Dust mites. Proteins in a pet's urine, saliva, or dander. Dander is dead skin cells from a pet. Smoke, mold, or car fumes. What increases the risk? You are more likely to develop this condition if you have a family history of allergies or other conditions related to allergies, including: Allergic conjunctivitis. This is inflammation of parts of the eyes and eyelids. Asthma. This condition affects the lungs and makes it hard to breathe. Atopic dermatitis or eczema. This is long term (chronic) inflammation of the skin. Food allergies. What are the signs or symptoms? Symptoms of this condition include: Sneezing or coughing. A stuffy nose (nasal congestion), itchy nose, or nasal discharge. Itchy eyes and tearing of the eyes. A feeling of mucus dripping down the back of your throat (postnasal drip). Trouble sleeping. Tiredness or fatigue. Headache. Sore throat. How is this diagnosed? This condition may be diagnosed with your symptoms, medical history, and physical exam. Your health care provider may check for related conditions, such as: Asthma. Pink eye. This is eye inflammation caused by infection (conjunctivitis). Ear infection. Upper respiratory infection. This is an infection in the nose, throat, or upper airways. You may also have tests to find out which allergens trigger your symptoms. These  may include skin tests or blood tests. How is this treated? There is no cure for this condition, but treatment can help control symptoms. Treatment may include: Taking medicines that block allergy symptoms, such as corticosteroids and antihistamines. Medicine may be given as a shot, nasal spray, or pill. Avoiding any allergens. Being exposed again and again to tiny amounts of allergens to help you build a defense against allergens (immunotherapy). This is done if other treatments have not helped. It may include: Allergy shots. These are injected medicines that have small amounts of  allergen in them. Sublingual immunotherapy. This involves taking small doses of a medicine with allergen in it under your tongue. If these treatments do not work, your health care provider may prescribe newer, stronger medicines. Follow these instructions at home: Avoiding allergens Find out what you are allergic to and avoid those allergens. These are some things you can do to help avoid allergens: If you have perennial allergies: Replace carpet with wood, tile, or vinyl flooring. Carpet can trap dander and dust. Do not smoke. Do not allow smoking in your home. Change your heating and air conditioning filters at least once a month. If you have seasonal allergies, take these steps during allergy season: Keep windows closed as much as possible. Plan outdoor activities when pollen counts are lowest. Check pollen counts before you plan outdoor activities. When coming indoors, change clothing and shower before sitting on furniture or bedding. If you have a pet in the house that produces allergens: Keep the pet out of the bedroom. Vacuum, sweep, and dust regularly. General instructions Take over-the-counter and prescription medicines only as told by your health care provider. Drink enough fluid to keep your urine pale yellow. Keep all follow-up visits as told by your health care provider. This is important. Where to find more information American Academy of Allergy, Asthma & Immunology: www.aaaai.org Contact a health care provider if: You have a fever. You develop a cough that does not go away. You make whistling sounds when you breathe (wheeze). Your symptoms slow you down or stop you from doing your normal activities each day. Get help right away if: You have shortness of breath. This symptom may represent a serious problem that is an emergency. Do not wait to see if the symptom will go away. Get medical help right away. Call your local emergency services (911 in the [place] Do not drive  yourself to the hospital. Summary Allergic rhinitis may be managed by taking medicines as directed and avoiding allergens. If you have seasonal allergies, keep windows closed as much as possible during allergy season. Contact your health care provider if you develop a fever or a cough that does not go away. This information is not intended to replace advice given to you by your health care provider. Make sure you discuss any questions you have with your health care provider. Document Revised: 10/26/2019 Document Reviewed: 09/04/2019 Elsevier Patient Education  2022 [employer].

## 2021-08-05 NOTE — Progress Notes (Signed)
Acute Office Visit  Subjective:    Patient ID: GUY TONEY, female    DOB: 02-25-1966, 55 y.o.   MRN: [id number]  Chief Complaint  Patient presents with   Allergies    HPI: Patient is in today for sinus congestion/pressure and tenderness. Onset of symptoms was two months ago. Treatment has included Singulair 10 mg daily and Flonase nasal spray. She is a long-time cigarette smoker.   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 Small internal hemorrhoids   GALLBLADDER SURGERY     REPLACEMENT TOTAL KNEE Right 02/09/2019   TOTAL KNEE ARTHROPLASTY  02/09/2019   TUBAL LIGATION      Family History  Problem Relation Age of Onset   COPD Mother    Parkinson's disease Mother    COPD Father    Arthritis Father    Lung disease Father    Colon cancer Cousin        mother's cousin   Breast cancer Maternal Aunt        great aunt   Diabetes Other    Stroke Other    Hypertension Other    Hyperlipidemia Other    Asthma Other    Heart failure Other    Thyroid disease Other    Heart attack Other    COPD Other    Arrhythmia Other    Arthritis Other    Migraines Other     Social History   Socioeconomic History   Marital status: Widowed    Spouse name: Not on file   Number of children: 2   Years of education: Not on file   Highest education level: Not on file  Occupational History   Not on file  Tobacco Use   Smoking status: Every Day    Packs/day:  1.00    Types: Cigarettes   Smokeless tobacco: Never   Tobacco comments:    smokes a pack and a half daily 11.25.19  Vaping Use   Vaping Use: Never used  Substance and Sexual Activity   Alcohol use: Yes   Drug use: No   Sexual activity: Not on file  Other Topics Concern   Not on file  Social History Narrative   Not on file   Social Determinants of Health   Financial Resource Strain: Not on file  Food Insecurity: Not on file  Transportation Needs: Not on file  Physical Activity: Not on file  Stress: Not on file  Social Connections: Not on file  Intimate Partner Violence: Not on file    Outpatient Medications Prior to Visit  Medication Sig Dispense Refill   albuterol (PROVENTIL) (2.5 MG/3ML) 0.083% nebulizer solution as needed.   6   albuterol (VENTOLIN HFA) 108 (90 Base) MCG/ACT inhaler Inhale 2 puffs into the lungs every 6 (six) hours as needed for wheezing or shortness of breath. 8 g 5   ALPRAZolam (  XANAX) 0.25 MG tablet TAKE 1 TABLET(0.25 MG) BY MOUTH DAILY AS NEEDED FOR ANXIETY 30 tablet 1   amLODipine (NORVASC) 10 MG tablet TAKE 1 TABLET(10 MG) BY MOUTH DAILY 90 tablet 0   atorvastatin (LIPITOR) 40 MG tablet Take 1 tablet (40 mg total) by mouth daily. 90 tablet 0   cyclobenzaprine (FLEXERIL) 5 MG tablet TAKE 1 TABLET(5 MG) BY MOUTH THREE TIMES DAILY AS NEEDED FOR MUSCLE SPASMS 90 tablet 0   furosemide (LASIX) 20 MG tablet TAKE 1 TABLET(20 MG) BY MOUTH DAILY 30 tablet 3   gabapentin (NEURONTIN) 300 MG capsule Take 300 mg by mouth 3 (three) times daily.     montelukast (SINGULAIR) 10 MG tablet TAKE 1 TABLET(10 MG) BY MOUTH DAILY 90 tablet 0   Multiple Vitamins-Minerals (50+ ADULT EYE HEALTH PO) Take 1 tablet by mouth daily.     nicotine (NICODERM CQ - DOSED IN MG/24 HOURS) 21 mg/24hr patch APPLY 1 PATCH(21 MG) TOPICALLY TO THE SKIN DAILY 28 patch 0   nitroGLYCERIN (NITROSTAT) 0.4 MG SL tablet DISSOLVE 1 TABLET BY MOUTH UNDER THE TONGUE AS NEEDED FOR CHEST PAIN EVERY 5  MINUTES UP TO 3 TIMES 25 tablet 0   pantoprazole (PROTONIX) 40 MG tablet Take 1 tablet (40 mg total) by mouth daily. 90 tablet 1   vitamin E 180 MG (400 UNITS) capsule Take by mouth.     amoxicillin (AMOXIL) 500 MG capsule Take 500 mg by mouth 2 (two) times daily.     amoxicillin-clavulanate (AUGMENTIN) 875-125 MG tablet Take 1 tablet by mouth 2 (two) times daily. 20 tablet 0   budesonide-formoterol (SYMBICORT) 160-4.5 MCG/ACT inhaler Inhale 2 puffs into the lungs 2 (two) times daily. 1 each 3   buPROPion (WELLBUTRIN XL) 300 MG 24 hr tablet Take 1 tablet (300 mg total) by mouth daily. 90 tablet 1   dicyclomine (BENTYL) 10 MG capsule Take 1 capsule (10 mg total) by mouth 4 (four) times daily -  before meals and at bedtime. 120 capsule 2   fluticasone (FLONASE) 50 MCG/ACT nasal spray Place 2 sprays into both nostrils daily. 16 g 6   nystatin (MYCOSTATIN) 100000 UNIT/ML suspension Take 5 mLs (500,000 Units total) by mouth 4 (four) times daily. 400 mL 0   topiramate (TOPAMAX) 50 MG tablet Take 50 mg by mouth daily.   0   valACYclovir (VALTREX) 500 MG tablet Take 1 tablet (500 mg total) by mouth 3 (three) times daily. 21 tablet 0   No facility-administered medications prior to visit.    Allergies  Allergen Reactions   Levofloxacin Other (See Comments) and Rash    Rash all over redness    Levaquin [Levofloxacin In D5w]    Tape Hives   Codeine Nausea Only   Other Rash    bandaids leave a red area if left on too long    Review of Systems  Constitutional:  Negative for chills, fatigue and fever.  HENT:  Positive for congestion, ear pain (right ear), postnasal drip, rhinorrhea, sinus pressure and sinus pain. Negative for sore throat.   Eyes:  Positive for pain, discharge and itching.       Bilateral eye puffiness  Respiratory:  Negative for cough and shortness of breath.   Cardiovascular:  Negative for chest pain.  Gastrointestinal:  Negative for diarrhea and nausea.  Genitourinary:  Negative.   Musculoskeletal:  Positive for back pain (chronic).  Skin: Negative.   Allergic/Immunologic: Positive for environmental allergies.  Neurological:  Positive for headaches. Negative for  dizziness.  Hematological: Negative.   Psychiatric/Behavioral: Negative.        Objective:    Physical Exam Constitutional:      Appearance: She is ill-appearing.  HENT:     Nose: Congestion and rhinorrhea present.     Right Sinus: Maxillary sinus tenderness and frontal sinus tenderness present.     Left Sinus: Maxillary sinus tenderness and frontal sinus tenderness present.     Mouth/Throat:     Mouth: Mucous membranes are dry.     Pharynx: Posterior oropharyngeal erythema present.  Eyes:     Pupils: Pupils are equal, round, and reactive to light.  Cardiovascular:     Rate and Rhythm: Normal rate and regular rhythm.     Pulses: Normal pulses.     Heart sounds: Normal heart sounds.  Pulmonary:     Effort: Pulmonary effort is normal.     Breath sounds: Normal breath sounds.  Abdominal:     General: Bowel sounds are normal.     Palpations: Abdomen is soft.  Skin:    General: Skin is warm and dry.     Capillary Refill: Capillary refill takes less than 2 seconds.  Neurological:     General: No focal deficit present.     Mental Status: She is alert and oriented to person, place, and time.    Temp (!) 97.3 F (36.3 C)   Ht 5\' 7"  (1.702 m)   Wt 195 lb (88.5 kg)   LMP  (LMP Unknown) Comment: hysterectomy 2008  BMI 30.54 kg/m  BP 118/64   Pulse 78   Temp (!) 97.3 F (36.3 C)   Ht 5\' 7"  (1.702 m)   Wt 195 lb (88.5 kg)   LMP  (LMP Unknown) Comment: hysterectomy 2008  SpO2 98%   BMI 30.54 kg/m   Wt Readings from Last 3 Encounters:  08/05/21 195 lb (88.5 kg)  04/14/21 193 lb (87.5 kg)  12/12/20 193 lb (87.5 kg)    Health Maintenance Due  Topic Date Due   COVID-19 Vaccine (1) Never done   Pneumococcal Vaccine 63-30 Years old (1 - PCV) Never done   HIV Screening  Never  done   Hepatitis C Screening  Never done   TETANUS/TDAP  Never done   COLONOSCOPY (Pts 45-74yrs Insurance coverage will need to be confirmed)  Never done   MAMMOGRAM  08/10/2015   PAP SMEAR-Modifier  04/05/2017   INFLUENZA VACCINE  04/20/2021     Lab Results  Component Value Date   TSH 2.820 10/28/2020   Lab Results  Component Value Date   WBC 7.0 10/28/2020   HGB 14.1 10/28/2020   HCT 42.6 10/28/2020   MCV 93 10/28/2020   PLT 278 10/28/2020   Lab Results  Component Value Date   NA 140 10/28/2020   K 4.6 10/28/2020   CO2 23 10/28/2020   GLUCOSE 111 (H) 10/28/2020   BUN 10 10/28/2020   CREATININE 0.55 (L) 10/28/2020   BILITOT 0.2 10/28/2020   ALKPHOS 101 10/28/2020   AST 24 10/28/2020   ALT 38 (H) 10/28/2020   PROT 6.6 10/28/2020   ALBUMIN 4.7 10/28/2020   CALCIUM 9.7 10/28/2020   Lab Results  Component Value Date   CHOL 207 (H) 10/28/2020   Lab Results  Component Value Date   HDL 47 10/28/2020   Lab Results  Component Value Date   LDLCALC 131 (H) 10/28/2020   Lab Results  Component Value Date   TRIG 163 (H) 10/28/2020  Lab Results  Component Value Date   CHOLHDL 4.4 10/28/2020   Lab Results  Component Value Date   HGBA1C 5.7 (H) 10/28/2020       Assessment & Plan:  1. Acute non-recurrent frontal sinusitis - amoxicillin-clavulanate (AUGMENTIN) 875-125 MG tablet; Take 1 tablet by mouth 2 (two) times daily.  Dispense: 20 tablet; Refill: 0 - triamcinolone acetonide (KENALOG-40) injection 60 mg  2. Chronic allergic rhinitis - fluticasone (FLONASE) 50 MCG/ACT nasal spray; Place 2 sprays into both nostrils daily.  Dispense: 16 g; Refill: 6     Kenalog injection given in office Use Flonase nasal spray daily Take Augmentin twice daily for 10 days Fasting appointment  Follow-up:   An After Visit Summary was printed and given to the patient.  I, Rip Harbour, NP, have reviewed all documentation for this visit. The documentation on 08/05/21  for the exam, diagnosis, procedures, and orders are all accurate and complete.   Signed, Rip Harbour, NP [place] [phone]

## 2021-08-19 ENCOUNTER — Ambulatory Visit: Payer: 59 | Admitting: Nurse Practitioner

## 2021-08-26 ENCOUNTER — Other Ambulatory Visit: Payer: Self-pay | Admitting: Gastroenterology

## 2021-09-08 ENCOUNTER — Other Ambulatory Visit: Payer: Self-pay | Admitting: Nurse Practitioner

## 2021-09-22 ENCOUNTER — Encounter: Payer: Self-pay | Admitting: Legal Medicine

## 2021-09-22 ENCOUNTER — Other Ambulatory Visit: Payer: Self-pay

## 2021-09-22 ENCOUNTER — Ambulatory Visit (INDEPENDENT_AMBULATORY_CARE_PROVIDER_SITE_OTHER): Payer: 59 | Admitting: Legal Medicine

## 2021-09-22 VITALS — BP 110/70 | HR 83 | Temp 98.8°F | Resp 16 | Ht 67.0 in | Wt 190.0 lb

## 2021-09-22 DIAGNOSIS — M7711 Lateral epicondylitis, right elbow: Secondary | ICD-10-CM | POA: Diagnosis not present

## 2021-09-22 MED ORDER — HYDROCODONE-ACETAMINOPHEN 10-325 MG PO TABS: 1.0000 | ORAL_TABLET | Freq: Three times a day (TID) | ORAL | 0 refills | Status: AC | PRN

## 2021-09-22 NOTE — Progress Notes (Signed)
Acute Office Visit  Subjective:    Patient ID: [name], female    DOB: Sep 07, 1966, 56 y.o.   MRN: [id number]  Chief Complaint  Patient presents with   Elbow Pain   Arm Pain    Right arm pain    HPI: Patient is in today for right elbow pain with radiation to right forearm. She did not remember any injury. She mentioned that she ROM limited. Patient said the problem started 6 months ago and it got worse 2 days ago.pain worse over lateral epicondyle.  Past Medical History:  Diagnosis Date   Allergic rhinitis    Arthritis    Blood transfusion without reported diagnosis    COPD (chronic obstructive pulmonary disease) (HCC)    GERD (gastroesophageal reflux disease)    History of colon polyps    HTN (hypertension)    Hyperlipidemia    Kidney stones    Pancreatitis    Pneumonia    Psychotic depression [facility])    Shingles February 26th, 2001    Past Surgical History:  Procedure Laterality Date   ABDOMINAL HYSTERECTOMY     CESAREAN SECTION     COLONOSCOPY  05/02/2017   Colonic polyp status post polypectomy. Small internal hemorrhoids   GALLBLADDER SURGERY     REPLACEMENT TOTAL KNEE Right 02/09/2019   TOTAL KNEE ARTHROPLASTY  02/09/2019   TUBAL LIGATION      Family History  Problem Relation Age of Onset   COPD Mother    Parkinson's disease Mother    COPD Father    Arthritis Father    Lung disease Father    Colon cancer Cousin        mother's cousin   Breast cancer Maternal Aunt        great aunt   Diabetes Other    Stroke Other    Hypertension Other    Hyperlipidemia Other    Asthma Other    Heart failure Other    Thyroid disease Other    Heart attack Other    COPD Other    Arrhythmia Other    Arthritis Other    Migraines Other     Social History   Socioeconomic History   Marital status: Widowed    Spouse name: Not on file   Number of children: 2   Years of education: Not on file   Highest education level: Not on file  Occupational History    Not on file  Tobacco Use   Smoking status: Every Day    Packs/day: 1.00    Types: Cigarettes   Smokeless tobacco: Never   Tobacco comments:    smokes a pack and a half daily 11.25.19  Vaping Use   Vaping Use: Never used  Substance and Sexual Activity   Alcohol use: Yes   Drug use: No   Sexual activity: Not on file  Other Topics Concern   Not on file  Social History Narrative   Not on file   Social Determinants of Health   Financial Resource Strain: Not on file  Food Insecurity: Not on file  Transportation Needs: Not on file  Physical Activity: Not on file  Stress: Not on file  Social Connections: Not on file  Intimate Partner Violence: Not on file    Outpatient Medications Prior to Visit  Medication Sig Dispense Refill   albuterol (PROVENTIL) (2.5 MG/3ML) 0.083% nebulizer solution as needed.   6   albuterol (VENTOLIN HFA) 108 (90 Base) MCG/ACT inhaler Inhale 2 puffs  into the lungs every 6 (six) hours as needed for wheezing or shortness of breath. 8 g 5   ALPRAZolam (XANAX) 0.25 MG tablet TAKE 1 TABLET(0.25 MG) BY MOUTH DAILY AS NEEDED FOR ANXIETY 30 tablet 0   amLODipine (NORVASC) 10 MG tablet TAKE 1 TABLET(10 MG) BY MOUTH DAILY 90 tablet 0   atorvastatin (LIPITOR) 40 MG tablet TAKE 1 TABLET(40 MG) BY MOUTH EVERY DAY 90 tablet 0   cyclobenzaprine (FLEXERIL) 5 MG tablet TAKE 1 TABLET(5 MG) BY MOUTH THREE TIMES DAILY AS NEEDED FOR MUSCLE SPASMS 90 tablet 0   fluticasone (FLONASE) 50 MCG/ACT nasal spray Place 2 sprays into both nostrils daily. 16 g 6   furosemide (LASIX) 20 MG tablet TAKE 1 TABLET(20 MG) BY MOUTH DAILY 30 tablet 3   gabapentin (NEURONTIN) 300 MG capsule Take 1 capsule (300 mg total) by mouth 3 (three) times daily. 90 capsule 0   montelukast (SINGULAIR) 10 MG tablet Take 1 tablet (10 mg total) by mouth at bedtime. 90 tablet 0   Multiple Vitamins-Minerals (50+ ADULT EYE HEALTH PO) Take 1 tablet by mouth daily.     nitroGLYCERIN (NITROSTAT) 0.4 MG SL tablet  DISSOLVE 1 TABLET BY MOUTH UNDER THE TONGUE AS NEEDED FOR CHEST PAIN EVERY 5 MINUTES UP TO 3 TIMES 25 tablet 0   pantoprazole (PROTONIX) 40 MG tablet TAKE 1 TABLET(40 MG) BY MOUTH DAILY 90 tablet 1   vitamin E 180 MG (400 UNITS) capsule Take by mouth.     amoxicillin-clavulanate (AUGMENTIN) 875-125 MG tablet Take 1 tablet by mouth 2 (two) times daily. 20 tablet 0   nicotine (NICODERM CQ - DOSED IN MG/24 HOURS) 21 mg/24hr patch APPLY 1 PATCH(21 MG) TOPICALLY TO THE SKIN DAILY 28 patch 0   No facility-administered medications prior to visit.    Allergies  Allergen Reactions   Levofloxacin Other (See Comments) and Rash    Rash all over redness    Levaquin [Levofloxacin In D5w]    Tape Hives   Codeine Nausea Only   Other Rash    bandaids leave a red area if left on too long    Review of Systems  Constitutional:  Negative for chills, fatigue and fever.  HENT:  Negative for congestion, ear pain and sore throat.   Respiratory:  Negative for cough and shortness of breath.   Cardiovascular:  Negative for chest pain and palpitations.  Gastrointestinal:  Negative for abdominal pain, constipation, diarrhea, nausea and vomiting.  Endocrine: Negative for polydipsia, polyphagia and polyuria.  Genitourinary:  Negative for difficulty urinating and dysuria.  Musculoskeletal:  Positive for arthralgias (Right elbow). Negative for back pain and myalgias.  Skin:  Negative for rash.  Neurological:  Negative for headaches.  Psychiatric/Behavioral:  Negative for dysphoric mood. The patient is not nervous/anxious.       Objective:    Physical Exam Vitals reviewed.  Constitutional:      Appearance: Normal appearance.  Neck:     Vascular: No carotid bruit.  Cardiovascular:     Rate and Rhythm: Normal rate and regular rhythm.     Pulses: Normal pulses.     Heart sounds: Normal heart sounds.  Pulmonary:     Effort: Pulmonary effort is normal.     Breath sounds: Normal breath sounds.  Abdominal:      General: Bowel sounds are normal.     Palpations: Abdomen is soft.     Tenderness: There is no abdominal tenderness.  Musculoskeletal:  General: Tenderness present.     Comments: Local pain over lateral epicondyle. Pain in supination and pronation. She has complete ROM but it is painful.  Neurological:     Mental Status: She is alert and oriented to person, place, and time.  Psychiatric:        Mood and Affect: Mood normal.        Behavior: Behavior normal.    BP 110/70    Pulse 83    Temp 98.8 F (37.1 C)    Resp 16    Ht 5\' 7"  (1.702 m)    Wt 190 lb (86.2 kg)    LMP  (LMP Unknown) Comment: hysterectomy 2008   SpO2 97%    BMI 29.76 kg/m  Wt Readings from Last 3 Encounters:  09/22/21 190 lb (86.2 kg)  08/05/21 195 lb (88.5 kg)  04/14/21 193 lb (87.5 kg)    Health Maintenance Due  Topic Date Due   Pneumococcal Vaccine 9-69 Years old (1 - PCV) Never done   TETANUS/TDAP  Never done   PAP SMEAR-Modifier  04/05/2017    There are no preventive care reminders to display for this patient.   Lab Results  Component Value Date   TSH 2.820 10/28/2020   Lab Results  Component Value Date   WBC 7.0 10/28/2020   HGB 14.1 10/28/2020   HCT 42.6 10/28/2020   MCV 93 10/28/2020   PLT 278 10/28/2020   Lab Results  Component Value Date   NA 140 10/28/2020   K 4.6 10/28/2020   CO2 23 10/28/2020   GLUCOSE 111 (H) 10/28/2020   BUN 10 10/28/2020   CREATININE 0.55 (L) 10/28/2020   BILITOT 0.2 10/28/2020   ALKPHOS 101 10/28/2020   AST 24 10/28/2020   ALT 38 (H) 10/28/2020   PROT 6.6 10/28/2020   ALBUMIN 4.7 10/28/2020   CALCIUM 9.7 10/28/2020   Lab Results  Component Value Date   CHOL 207 (H) 10/28/2020   Lab Results  Component Value Date   HDL 47 10/28/2020   Lab Results  Component Value Date   LDLCALC 131 (H) 10/28/2020   Lab Results  Component Value Date   TRIG 163 (H) 10/28/2020   Lab Results  Component Value Date   CHOLHDL 4.4 10/28/2020   Lab  Results  Component Value Date   HGBA1C 5.7 (H) 10/28/2020       Assessment & Plan:   Problem List Items Addressed This Visit   None Visit Diagnoses     Lateral epicondylitis of right elbow    -  Primary   Relevant Medications   HYDROcodone-acetaminophen (NORCO) 10-325 MG tablet   Other Relevant Orders   DG Elbow Complete Right Inject right lateral epicondyle with relief  After consent was obtained, using sterile technique the right lateral epicondyl was prepped and plain Ethyl Chloride was used as local anesthetic. The lateral epicondyle  Steroid 49 mg and 1 ml plain Lidocaine was then injected and the needle withdrawn.  The procedure was well tolerated.  The patient is asked to continue to rest the joint for a few more days before resuming regular activities.  It may be more painful for the first 1-2 days.  Watch for fever, or increased swelling or persistent pain in the joint. Call or return to clinic prn if such symptoms occur or there is failure to improve as anticipated.       Meds ordered this encounter  Medications   HYDROcodone-acetaminophen (NORCO) 10-325 MG tablet  Sig: Take 1 tablet by mouth every 8 (eight) hours as needed for up to 5 days.    Dispense:  15 tablet    Refill:  0    Orders Placed This Encounter  Procedures   DG Elbow Complete Right     Follow-up: Return in about 2 weeks (around 10/06/2021) for lateral epicondylitis.  An After Visit Summary was printed and given to the patient.  Reinaldo Meeker, MD [facility] [phone]

## 2021-09-22 NOTE — Patient Instructions (Signed)
Elbow and Forearm Exercises Ask your health care provider which exercises are safe for you. Do exercises exactly as told by your health care provider and adjust them as directed. It is normal to feel mild stretching, pulling, tightness, or discomfort as you do these exercises. Stop right away if you feel sudden pain or your pain gets worse. Do not begin these exercises until told by your health care provider. Range-of-motion exercises These exercises warm up your muscles and joints and improve the movement and flexibility of your injured elbow and forearm. The exercises also help to relieve pain, numbness, and tingling. These exercises are done using the muscles in your injured elbow and forearm (active). Elbow flexion, active Hold your left / right arm at your side, and bend your elbow (flexion) as far as you can using only your arm muscles. Hold this position for __________ seconds. Slowly return to the starting position. Repeat __________ times. Complete this exercise __________ times a day. Elbow extension, active Hold your left / right arm at your side, and straighten your elbow (extension) as much as you can using only your arm muscles. Hold this position for __________ seconds. Slowly return to the starting position. Repeat __________ times. Complete this exercise __________ times a day. Active forearm rotation, supination This is an exercise in which you turn (rotate) your forearm palm up (supination). Stand or sit with your elbows at your sides. Bend your left / right elbow to a 90-degree angle (right angle). Rotate your palm up until you feel a gentle stretch on the inside of your forearm. Hold this position for __________ seconds. Slowly return to the starting position. Repeat __________ times. Complete this exercise __________ times a day. Active forearm rotation, pronation This is an exercise in which you turn (rotate) your forearm palm down (pronation). Stand or sit with your  elbows at your sides. Bend your left / right elbow to a 90-degree angle (right angle). Rotate your palm down until you feel a gentle stretch on the top of your forearm. Hold this position for __________ seconds. Slowly return to the starting position. Repeat __________ times. Complete this exercise __________ times a day. Stretching exercises These exercises warm up your muscles and joints and improve the movement and flexibility of your injured elbow and forearm. These exercises also help to relieve pain, numbness, and tingling. These exercises are done using your healthy elbow and forearm to help stretch the muscles in your injured elbow and forearm (active-assisted). Elbow flexion, active-assisted  Hold your left / right arm at your side, and bend your elbow (flexion) as much as you can using your left / right arm muscles. Use your other hand to bend your left / right elbow farther. To do this, gently push up on your forearm until you feel a gentle stretch on the back of your elbow. Hold this position for __________ seconds. Slowly return to the starting position. Repeat __________ times. Complete this exercise __________ times a day. Elbow extension, active-assisted  Hold your left / right arm at your side, and straighten your elbow (extension) as much as you can using your left / right arm muscles. Use your other hand to straighten the left / right elbow farther. To do this, gently push down on your forearm until you feel a gentle stretch on the inside of your elbow. Hold this position for __________ seconds. Slowly return to the starting position. Repeat __________ times. Complete this exercise __________ times a day. Active-assisted forearm rotation, supination This is  an exercise in which you turn (rotate) your forearm palm up (supination). Sit with your left / right elbow bent in a 90-degree angle (right angle) with your forearm resting on a table. Keeping your upper body and  shoulder still, rotate your forearm so your palm faces upward. Use your other hand to help rotate your forearm further until you feel a gentle to moderate stretch. Hold this position for __________ seconds. Slowly release the stretch and return to the starting position. Repeat __________ times. Complete this exercise __________ times a day. Active-assisted forearm rotation, pronation This is an exercise in which you turn (rotate) your forearm palm down (pronation). Sit with your left / right elbow bent in a 90-degree angle (right angle) with your forearm resting on a table. Keeping your upper body and shoulder still, rotate your forearm so your palm faces the tabletop. Use your other hand to help rotate your forearm further until you feel a gentle to moderate stretch. Hold this position for __________ seconds. Slowly release the stretch and return to the starting position. Repeat __________ times. Complete this exercise __________ times a day. Passive elbow flexion, supine Lie on your back (supine position). Extend your left / right arm up in the air, bracing it with your other hand. Let your left / right hand slowly lower toward your shoulder (passive flexion), while your elbow stays pointed toward the ceiling. You should feel a gentle stretch along the back of your upper arm and elbow. If instructed by your health care provider, you may increase the intensity of your stretch by adding a small wrist weight or hand weight. Hold this position for __________ seconds. Slowly return to the starting position. Repeat __________ times. Complete this exercise __________ times a day. Passive elbow extension, supine  Lie on your back (supine position). Make sure that you are in a comfortable position that lets you relax your arm muscles. Place a folded towel under your left / right upper arm so your elbow and shoulder are at the same height. Straighten your left / right arm so your elbow does not rest  on the bed or towel. Let the weight of your hand stretch your elbow (passive extension). Keep your arm and chest muscles relaxed. You should feel a stretch on the inside of your elbow. If told by your health care provider, you may increase the intensity of your stretch by adding a small wrist weight or hand weight. Hold this position for __________ seconds. Slowly release the stretch. Repeat __________ times. Complete this exercise __________ times a day. Strengthening exercises These exercises build strength and endurance in your elbow and forearm. Endurance is the ability to use your muscles for a long time, even after they get tired. Elbow flexion, isometric  Stand or sit up straight. Bend your left / right elbow in a 90-degree angle (right angle), and keep your forearm at the height of your waist. Your thumb should be pointed toward the ceiling (neutral forearm). Place your other hand on top of your left / right forearm. Gently push down while you resist with your left / right arm (isometric flexion). Push as hard as you can with both arms without causing any pain or movement at your left / right elbow. Hold this position for __________ seconds. Slowly release the tension in both arms. Let your muscles relax completely before you repeat the exercise. Repeat __________ times. Complete this exercise __________ times a day. Elbow extension, isometric  Stand or sit up straight. Place  your left / right arm so your palm faces your abdomen and is at the height of your waist. Place your other hand on the underside of your left / right forearm. Gently push up while you resist with your left / right arm (isometric extension). Push as hard as you can with both arms without causing any pain or movement at your left / right elbow. Hold this position for __________ seconds. Slowly release the tension in both arms. Let your muscles relax completely before you repeat the exercise. Repeat __________ times.  Complete this exercise __________ times a day. Elbow flexion with forearm palm up  Sit on a firm chair without armrests, or stand up. Place your left / right arm at your side with your elbow straight and your palm facing forward. Holding a __________weight or gripping a rubber exercise band or tubing, bend your elbow to bring your hand toward your shoulder (flexion). Hold this position for __________ seconds. Slowly return to the starting position. Repeat __________ times. Complete this exercise __________ times a day. Elbow extension, active  Sit on a firm chair without armrests, or stand up. Hold a rubber exercise band or tubing in both hands. Keeping your upper arms at your sides, bring both hands up to your left / right shoulder. Keep your left / right hand just below your other hand. Straighten your left / right elbow (extension) while keeping your other arm still. Hold this position for __________ seconds. Control the resistance of the band or tubing as you return to the starting position. Repeat __________ times. Complete this exercise __________ times a day. Forearm rotation, supination  Sit with your left / right forearm supported on a table. Your elbow should be at waist height and bent at a 90-degree angle (right angle). Gently grasp a lightweight hammer. Rest your hand over the edge of the table with your palm facing down. Without moving your left / right elbow, slowly rotate your forearm to turn your palm up toward the ceiling (supination). Hold this position for __________ seconds. Slowly return to the starting position. Repeat __________ times. Complete this exercise __________ times a day. Forearm rotation, pronation  Sit with your left / right forearm supported on a table. Keep your elbow below shoulder height. Gently grasp a lightweight hammer. Rest your hand over the edge of the table with your palm facing up. Without moving your left / right elbow, slowly rotate  your forearm to turn your palm down toward the floor (pronation). Hold this position for __________seconds. Slowly return to the starting position. Repeat __________ times. Complete this exercise __________ times a day. This information is not intended to replace advice given to you by your health care provider. Make sure you discuss any questions you have with your health care provider. Document Revised: 12/28/2018 Document Reviewed: 09/27/2018 Elsevier Patient Education  [employer].

## 2021-09-24 ENCOUNTER — Telehealth: Payer: Self-pay

## 2021-09-24 NOTE — Telephone Encounter (Signed)
Patient came on 09/22/2021 for her elbow pain. She forgot to tell that she has some sinus infection. She asked if can you send antibiotic for sinus infection? Please advice.

## 2021-09-24 NOTE — Telephone Encounter (Signed)
Made appointment.   Harrell Lark 09/24/21 4:37 PM

## 2021-09-25 ENCOUNTER — Encounter: Payer: Self-pay | Admitting: Nurse Practitioner

## 2021-09-25 ENCOUNTER — Telehealth (INDEPENDENT_AMBULATORY_CARE_PROVIDER_SITE_OTHER): Payer: 59 | Admitting: Nurse Practitioner

## 2021-09-25 VITALS — Temp 97.4°F | Ht 67.0 in | Wt 190.0 lb

## 2021-09-25 DIAGNOSIS — J329 Chronic sinusitis, unspecified: Secondary | ICD-10-CM | POA: Diagnosis not present

## 2021-09-25 MED ORDER — AMOXICILLIN-POT CLAVULANATE 875-125 MG PO TABS: 1.0000 | ORAL_TABLET | Freq: Two times a day (BID) | ORAL | 0 refills | Status: DC

## 2021-09-25 NOTE — Progress Notes (Signed)
Virtual Visit via Telephone Note   This visit type was conducted due to national recommendations for restrictions regarding the COVID-19 Pandemic (e.g. social distancing) in an effort to limit this patient's exposure and mitigate transmission in our community.  Due to her co-morbid illnesses, this patient is at least at moderate risk for complications without adequate follow up.  This format is felt to be most appropriate for this patient at this time.  The patient did not have access to video technology/had technical difficulties with video requiring transitioning to audio format only (telephone).  All issues noted in this document were discussed and addressed.  No physical exam could be performed with this format.  Patient verbally consented to a telehealth visit.   Date:  09/25/2021   ID:  [name], DOB Feb 23, 1966, MRN [id number]  Patient Location: Home Provider Location: Office/Clinic  PCP:  Rochel Brome, MD   Evaluation Performed:  Follow-Up Visit  Chief Complaint:  sinus congestion, headache  History of Present Illness:    [name] is a 56 y.o. female with sinus congestion, headache, and cough. Onset of symptoms was two weeks ago. Treatment has included Mucinex, Alka-seltzer cold and flu, and Ibuprofen. Has a history of chronic sinusitis. Current cigarette smoker. She has not obtained COVID-19 immunizations or seasonal flu vaccine.  The patient does have symptoms concerning for COVID-19 infection (fever, chills, cough, or new shortness of breath).    Past Medical History:  Diagnosis Date   Allergic rhinitis    Arthritis    Blood transfusion without reported diagnosis    COPD (chronic obstructive pulmonary disease) (HCC)    GERD (gastroesophageal reflux disease)    History of colon polyps    HTN (hypertension)    Hyperlipidemia    Kidney stones    Pancreatitis    Pneumonia    Psychotic depression [facility])    Shingles February 26th, 2001    Past Surgical  History:  Procedure Laterality Date   ABDOMINAL HYSTERECTOMY     CESAREAN SECTION     COLONOSCOPY  05/02/2017   Colonic polyp status post polypectomy. Small internal hemorrhoids   GALLBLADDER SURGERY     REPLACEMENT TOTAL KNEE Right 02/09/2019   TOTAL KNEE ARTHROPLASTY  02/09/2019   TUBAL LIGATION      Family History  Problem Relation Age of Onset   COPD Mother    Parkinson's disease Mother    COPD Father    Arthritis Father    Lung disease Father    Colon cancer Cousin        mother's cousin   Breast cancer Maternal Aunt        great aunt   Diabetes Other    Stroke Other    Hypertension Other    Hyperlipidemia Other    Asthma Other    Heart failure Other    Thyroid disease Other    Heart attack Other    COPD Other    Arrhythmia Other    Arthritis Other    Migraines Other     Social History   Socioeconomic History   Marital status: Widowed    Spouse name: Not on file   Number of children: 2   Years of education: Not on file   Highest education level: Not on file  Occupational History   Not on file  Tobacco Use   Smoking status: Every Day    Packs/day: 1.00    Types: Cigarettes   Smokeless tobacco: Never   Tobacco  comments:    smokes a pack and a half daily 11.25.19  Vaping Use   Vaping Use: Never used  Substance and Sexual Activity   Alcohol use: Yes   Drug use: No   Sexual activity: Not on file  Other Topics Concern   Not on file  Social History Narrative   Not on file   Social Determinants of Health   Financial Resource Strain: Not on file  Food Insecurity: Not on file  Transportation Needs: Not on file  Physical Activity: Not on file  Stress: Not on file  Social Connections: Not on file  Intimate Partner Violence: Not on file    Outpatient Medications Prior to Visit  Medication Sig Dispense Refill   albuterol (PROVENTIL) (2.5 MG/3ML) 0.083% nebulizer solution as needed.   6   albuterol (VENTOLIN HFA) 108 (90 Base) MCG/ACT inhaler  Inhale 2 puffs into the lungs every 6 (six) hours as needed for wheezing or shortness of breath. 8 g 5   ALPRAZolam (XANAX) 0.25 MG tablet TAKE 1 TABLET(0.25 MG) BY MOUTH DAILY AS NEEDED FOR ANXIETY 30 tablet 0   amLODipine (NORVASC) 10 MG tablet TAKE 1 TABLET(10 MG) BY MOUTH DAILY 90 tablet 0   atorvastatin (LIPITOR) 40 MG tablet TAKE 1 TABLET(40 MG) BY MOUTH EVERY DAY 90 tablet 0   cyclobenzaprine (FLEXERIL) 5 MG tablet TAKE 1 TABLET(5 MG) BY MOUTH THREE TIMES DAILY AS NEEDED FOR MUSCLE SPASMS 90 tablet 0   fluticasone (FLONASE) 50 MCG/ACT nasal spray Place 2 sprays into both nostrils daily. 16 g 6   furosemide (LASIX) 20 MG tablet TAKE 1 TABLET(20 MG) BY MOUTH DAILY 30 tablet 3   gabapentin (NEURONTIN) 300 MG capsule Take 1 capsule (300 mg total) by mouth 3 (three) times daily. 90 capsule 0   HYDROcodone-acetaminophen (NORCO) 10-325 MG tablet Take 1 tablet by mouth every 8 (eight) hours as needed for up to 5 days. 15 tablet 0   montelukast (SINGULAIR) 10 MG tablet Take 1 tablet (10 mg total) by mouth at bedtime. 90 tablet 0   Multiple Vitamins-Minerals (50+ ADULT EYE HEALTH PO) Take 1 tablet by mouth daily.     nitroGLYCERIN (NITROSTAT) 0.4 MG SL tablet DISSOLVE 1 TABLET BY MOUTH UNDER THE TONGUE AS NEEDED FOR CHEST PAIN EVERY 5 MINUTES UP TO 3 TIMES 25 tablet 0   pantoprazole (PROTONIX) 40 MG tablet TAKE 1 TABLET(40 MG) BY MOUTH DAILY 90 tablet 1   vitamin E 180 MG (400 UNITS) capsule Take by mouth.     No facility-administered medications prior to visit.    Allergies:   Levofloxacin, Levaquin [levofloxacin in d5w], Tape, Codeine, and Other   Social History   Tobacco Use   Smoking status: Every Day    Packs/day: 1.00    Types: Cigarettes   Smokeless tobacco: Never   Tobacco comments:    smokes a pack and a half daily 11.25.19  Vaping Use   Vaping Use: Never used  Substance Use Topics   Alcohol use: Yes   Drug use: No     Review of Systems  Constitutional:  Positive for  malaise/fatigue. Negative for chills and fever.  HENT:  Positive for congestion and sinus pain. Negative for ear pain and sore throat.   Respiratory:  Positive for cough. Negative for shortness of breath.   Cardiovascular:  Negative for chest pain.  Gastrointestinal:  Negative for nausea and vomiting.  Musculoskeletal:  Negative for myalgias.  Neurological:  Positive for headaches.  Labs/Other Tests and Data Reviewed:    Recent Labs: 10/28/2020: ALT 38; BUN 10; Creatinine, Ser 0.55; Hemoglobin 14.1; Platelets 278; Potassium 4.6; Sodium 140; TSH 2.820   Recent Lipid Panel Lab Results  Component Value Date/Time   CHOL 207 (H) 10/28/2020 08:03 AM   TRIG 163 (H) 10/28/2020 08:03 AM   HDL 47 10/28/2020 08:03 AM   CHOLHDL 4.4 10/28/2020 08:03 AM   LDLCALC 131 (H) 10/28/2020 08:03 AM    Wt Readings from Last 3 Encounters:  09/22/21 190 lb (86.2 kg)  08/05/21 195 lb (88.5 kg)  04/14/21 193 lb (87.5 kg)     Objective:    Vital Signs:  LMP  (LMP Unknown) Comment: hysterectomy 2008  Temp (!) 97.4 F (36.3 C)    Ht 5\' 7"  (1.702 m)    Wt 190 lb (86.2 kg)    LMP  (LMP Unknown) Comment: hysterectomy 2008   BMI 29.76 kg/m    Physical Exam no physical exam due to telemedicine visit  ASSESSMENT & PLAN:     1. Chronic recurrent sinusitis - amoxicillin-clavulanate (AUGMENTIN) 875-125 MG tablet; Take 1 tablet by mouth 2 (two) times daily.  Dispense: 20 tablet; Refill: 0   Rest and push fluids Use Flonase nasal spray daily Continue Mucinex as directed Follow-up as needed   Follow-up: PRN, if symptoms fail to improve or worsen   COVID-19 Education: The signs and symptoms of COVID-19 were discussed with the patient and how to seek care for testing (follow up with PCP or arrange E-visit). The importance of social distancing was discussed today.   I spent 10 minutes dedicated to the care of this patient on the date of this encounter to include face-to-face time with the patient, as well  as: EMR review and prescription medication management.  Follow Up:  In Person prn  Signed,  Jerrell Belfast, DNP  09/25/2021 10:36 AM    [place]

## 2021-10-01 ENCOUNTER — Other Ambulatory Visit: Payer: Self-pay | Admitting: Family Medicine

## 2021-10-04 NOTE — Progress Notes (Signed)
Acute Office Visit  Subjective:    Patient ID: [name], female    DOB: November 24, 1965, 56 y.o.   MRN: [id number]  Chief Complaint  Patient presents with   Arm Pain   Elbow Pain    HPI: Patient is in today for right elbow pain with radiation to right forearm. She has numbness on her right hand when she flexion the elbow. She said the pain is better after kenalog shot.  Past Medical History:  Diagnosis Date   Allergic rhinitis    Arthritis    Blood transfusion without reported diagnosis    COPD (chronic obstructive pulmonary disease) (HCC)    GERD (gastroesophageal reflux disease)    History of colon polyps    HTN (hypertension)    Hyperlipidemia    Kidney stones    Pancreatitis    Pneumonia    Psychotic depression [facility])    Shingles February 26th, 2001    Past Surgical History:  Procedure Laterality Date   ABDOMINAL HYSTERECTOMY     CESAREAN SECTION     COLONOSCOPY  05/02/2017   Colonic polyp status post polypectomy. Small internal hemorrhoids   GALLBLADDER SURGERY     REPLACEMENT TOTAL KNEE Right 02/09/2019   TOTAL KNEE ARTHROPLASTY  02/09/2019   TUBAL LIGATION      Family History  Problem Relation Age of Onset   COPD Mother    Parkinson's disease Mother    COPD Father    Arthritis Father    Lung disease Father    Colon cancer Cousin        mother's cousin   Breast cancer Maternal Aunt        great aunt   Diabetes Other    Stroke Other    Hypertension Other    Hyperlipidemia Other    Asthma Other    Heart failure Other    Thyroid disease Other    Heart attack Other    COPD Other    Arrhythmia Other    Arthritis Other    Migraines Other     Social History   Socioeconomic History   Marital status: Widowed    Spouse name: Not on file   Number of children: 2   Years of education: Not on file   Highest education level: Not on file  Occupational History   Not on file  Tobacco Use   Smoking status: Every Day    Packs/day: 1.00    Types:  Cigarettes   Smokeless tobacco: Never   Tobacco comments:    smokes a pack and a half daily 11.25.19  Vaping Use   Vaping Use: Never used  Substance and Sexual Activity   Alcohol use: Yes   Drug use: No   Sexual activity: Not on file  Other Topics Concern   Not on file  Social History Narrative   Not on file   Social Determinants of Health   Financial Resource Strain: Not on file  Food Insecurity: Not on file  Transportation Needs: Not on file  Physical Activity: Not on file  Stress: Not on file  Social Connections: Not on file  Intimate Partner Violence: Not on file    Outpatient Medications Prior to Visit  Medication Sig Dispense Refill   albuterol (PROVENTIL) (2.5 MG/3ML) 0.083% nebulizer solution as needed.   6   albuterol (VENTOLIN HFA) 108 (90 Base) MCG/ACT inhaler Inhale 2 puffs into the lungs every 6 (six) hours as needed for wheezing or shortness of breath. 8  g 5   ALPRAZolam (XANAX) 0.25 MG tablet TAKE 1 TABLET(0.25 MG) BY MOUTH DAILY AS NEEDED FOR ANXIETY 30 tablet 0   amLODipine (NORVASC) 10 MG tablet TAKE 1 TABLET(10 MG) BY MOUTH DAILY 90 tablet 0   amoxicillin-clavulanate (AUGMENTIN) 875-125 MG tablet Take 1 tablet by mouth 2 (two) times daily. 20 tablet 0   atorvastatin (LIPITOR) 40 MG tablet TAKE 1 TABLET(40 MG) BY MOUTH EVERY DAY 90 tablet 0   cyclobenzaprine (FLEXERIL) 5 MG tablet TAKE 1 TABLET(5 MG) BY MOUTH THREE TIMES DAILY AS NEEDED FOR MUSCLE SPASMS 90 tablet 0   dicyclomine (BENTYL) 10 MG capsule TAKE 1 CAPSULE(10 MG) BY MOUTH FOUR TIMES DAILY BEFORE MEALS AND AT BEDTIME 120 capsule 2   fluticasone (FLONASE) 50 MCG/ACT nasal spray Place 2 sprays into both nostrils daily. 16 g 6   furosemide (LASIX) 20 MG tablet TAKE 1 TABLET(20 MG) BY MOUTH DAILY 30 tablet 3   gabapentin (NEURONTIN) 300 MG capsule Take 1 capsule (300 mg total) by mouth 3 (three) times daily. 90 capsule 0   montelukast (SINGULAIR) 10 MG tablet Take 1 tablet (10 mg total) by mouth at  bedtime. 90 tablet 0   Multiple Vitamins-Minerals (50+ ADULT EYE HEALTH PO) Take 1 tablet by mouth daily.     nitroGLYCERIN (NITROSTAT) 0.4 MG SL tablet DISSOLVE 1 TABLET BY MOUTH UNDER THE TONGUE AS NEEDED FOR CHEST PAIN EVERY 5 MINUTES UP TO 3 TIMES 25 tablet 0   pantoprazole (PROTONIX) 40 MG tablet TAKE 1 TABLET(40 MG) BY MOUTH DAILY 90 tablet 1   vitamin E 180 MG (400 UNITS) capsule Take by mouth.     No facility-administered medications prior to visit.    Allergies  Allergen Reactions   Levofloxacin Other (See Comments) and Rash    Rash all over redness    Levaquin [Levofloxacin In D5w]    Tape Hives   Codeine Nausea Only   Other Rash    bandaids leave a red area if left on too long    Review of Systems  Constitutional:  Negative for chills, fatigue and fever.  HENT:  Negative for congestion, ear pain and sore throat.   Respiratory:  Negative for cough and shortness of breath.   Cardiovascular:  Negative for chest pain and palpitations.  Gastrointestinal:  Positive for constipation (IBS). Negative for abdominal pain, diarrhea, nausea and vomiting.  Endocrine: Negative for polydipsia, polyphagia and polyuria.  Genitourinary:  Negative for difficulty urinating and dysuria.  Musculoskeletal:  Positive for arthralgias (Right elbow pain). Negative for back pain and myalgias.  Skin:  Negative for rash.  Neurological:  Negative for headaches.  Psychiatric/Behavioral:  Negative for dysphoric mood. The patient is not nervous/anxious.       Objective:    Physical Exam Constitutional:      Appearance: Normal appearance. She is obese.  HENT:     Head: Normocephalic.     Right Ear: Tympanic membrane, ear canal and external ear normal.     Left Ear: Tympanic membrane, ear canal and external ear normal.     Nose: Nose normal.     Mouth/Throat:     Mouth: Mucous membranes are moist.     Pharynx: Oropharynx is clear. No posterior oropharyngeal erythema.  Eyes:     Extraocular  Movements: Extraocular movements intact.     Conjunctiva/sclera: Conjunctivae normal.     Pupils: Pupils are equal, round, and reactive to light.  Neck:     Vascular: No carotid bruit.  Cardiovascular:     Rate and Rhythm: Normal rate and regular rhythm.     Pulses: Normal pulses.     Heart sounds: No murmur heard. Pulmonary:     Effort: Pulmonary effort is normal.     Breath sounds: Normal breath sounds.  Abdominal:     General: Bowel sounds are normal.     Palpations: Abdomen is soft. There is no mass.  Musculoskeletal:        General: Tenderness present.     Cervical back: Normal range of motion. No tenderness.     Right lower leg: No edema.     Left lower leg: No edema.     Comments: Still pain on Right shoulder, and pain on lateral epicondile Full movement in the right elbow.  Neurological:     Gait: Gait normal.     Deep Tendon Reflexes: Reflexes normal.  Psychiatric:        Mood and Affect: Mood normal.        Behavior: Behavior normal.        Thought Content: Thought content normal.    BP 100/70    Pulse 74    Temp 98.9 F (37.2 C)    Resp 16    Ht 5\' 7"  (1.702 m)    Wt 194 lb (88 kg)    LMP  (LMP Unknown) Comment: hysterectomy 2008   BMI 30.38 kg/m  Wt Readings from Last 3 Encounters:  10/05/21 194 lb (88 kg)  09/25/21 190 lb (86.2 kg)  09/22/21 190 lb (86.2 kg)    Health Maintenance Due  Topic Date Due   Pneumococcal Vaccine 67-48 Years old (1 - PCV) Never done   TETANUS/TDAP  Never done   PAP SMEAR-Modifier  04/05/2017    There are no preventive care reminders to display for this patient.   Lab Results  Component Value Date   TSH 2.820 10/28/2020   Lab Results  Component Value Date   WBC 7.0 10/28/2020   HGB 14.1 10/28/2020   HCT 42.6 10/28/2020   MCV 93 10/28/2020   PLT 278 10/28/2020   Lab Results  Component Value Date   NA 140 10/28/2020   K 4.6 10/28/2020   CO2 23 10/28/2020   GLUCOSE 111 (H) 10/28/2020   BUN 10 10/28/2020    CREATININE 0.55 (L) 10/28/2020   BILITOT 0.2 10/28/2020   ALKPHOS 101 10/28/2020   AST 24 10/28/2020   ALT 38 (H) 10/28/2020   PROT 6.6 10/28/2020   ALBUMIN 4.7 10/28/2020   CALCIUM 9.7 10/28/2020   Lab Results  Component Value Date   CHOL 207 (H) 10/28/2020   Lab Results  Component Value Date   HDL 47 10/28/2020   Lab Results  Component Value Date   LDLCALC 131 (H) 10/28/2020   Lab Results  Component Value Date   TRIG 163 (H) 10/28/2020   Lab Results  Component Value Date   CHOLHDL 4.4 10/28/2020   Lab Results  Component Value Date   HGBA1C 5.7 (H) 10/28/2020       Assessment & Plan:   Problem List Items Addressed This Visit   None Visit Diagnoses     Lateral epicondylitis of right elbow    -  Primary   Relevant Orders   Ambulatory referral to Orthopedic Surgery Patient is having much less pain in the right elbow and has full range of motion at the present time.  Minimal pain of the lateral epicondyles normal pronation and supination negative  [place].   Elbow effusion, right       Relevant Orders   Ambulatory referral to Orthopedic Surgery Patient had an elbow effusion on her x-ray we will need to recheck her x-ray to find if it has resolved.  No evidence for intra-articular fracture.  She still having some pain and using her right arm.   Chronic right shoulder pain       Relevant Orders   Ambulatory referral to Orthopedic Surgery Patient is having pain in her right shoulder and says she fractured it several years ago but did not require any surgery.  She did not go through physical therapy.  I recommended we refer her back to her orthopedist to get a look at her right elbow and check for effusion be certain there is no tear in the joint.        Orders Placed This Encounter  Procedures   Ambulatory referral to Orthopedic Surgery    I,Cedrica Brune,acting as a scribe for Reinaldo Meeker, MD.,have documented all relevant documentation on the behalf of  Reinaldo Meeker, MD,as directed by  Reinaldo Meeker, MD while in the presence of Reinaldo Meeker, MD.   Follow-up: No follow-ups on file.  An After Visit Summary was printed and given to the patient.  Reinaldo Meeker, MD [facility] [phone]

## 2021-10-05 ENCOUNTER — Ambulatory Visit (INDEPENDENT_AMBULATORY_CARE_PROVIDER_SITE_OTHER): Payer: 59 | Admitting: Legal Medicine

## 2021-10-05 ENCOUNTER — Encounter: Payer: Self-pay | Admitting: Legal Medicine

## 2021-10-05 ENCOUNTER — Other Ambulatory Visit: Payer: Self-pay

## 2021-10-05 VITALS — BP 100/70 | HR 74 | Temp 98.9°F | Resp 16 | Ht 67.0 in | Wt 194.0 lb

## 2021-10-05 DIAGNOSIS — M25511 Pain in right shoulder: Secondary | ICD-10-CM

## 2021-10-05 DIAGNOSIS — M25421 Effusion, right elbow: Secondary | ICD-10-CM | POA: Diagnosis not present

## 2021-10-05 DIAGNOSIS — G8929 Other chronic pain: Secondary | ICD-10-CM

## 2021-10-05 DIAGNOSIS — M7711 Lateral epicondylitis, right elbow: Secondary | ICD-10-CM | POA: Diagnosis not present

## 2021-10-06 ENCOUNTER — Other Ambulatory Visit: Payer: Self-pay | Admitting: Nurse Practitioner

## 2021-10-06 DIAGNOSIS — G629 Polyneuropathy, unspecified: Secondary | ICD-10-CM

## 2021-10-16 ENCOUNTER — Encounter: Payer: Self-pay | Admitting: Nurse Practitioner

## 2021-10-16 ENCOUNTER — Other Ambulatory Visit: Payer: Self-pay | Admitting: Nurse Practitioner

## 2021-10-16 DIAGNOSIS — J329 Chronic sinusitis, unspecified: Secondary | ICD-10-CM

## 2021-10-19 ENCOUNTER — Other Ambulatory Visit: Payer: 59

## 2021-10-19 NOTE — Progress Notes (Signed)
Subjective:  Patient ID: [name], female    DOB: 01-Oct-1965  Age: 56 y.o. MRN: [id number]  Chief Complaint  Patient presents with   Hyperlipidemia   Hypertension    HPI Hyperlipidemia:  She takes atorvastatin 40 mg daily. Hypertension: Patient is taking amlodipine 10 mg daily, furosemide 20 mg daily. Takes furosemide 20 mg three times a week for swelling.  GERD: Pantoprazole 40 mg daily. Prediabetes; a1c 5.7. Eating healthy. Not exercising. Frustrated by weight gain. Requesting to try mounjaro.  Patient lost job 08/2022. Laid off due to decreased business.  Chronic back pain: on gabapentin 300 mg three times a day. Has had ESIs in the past that have helped.  Patient has a boyfriend, who lives part time with her. Patient was widowed 10 yrs ago. Husband had a heart attack. She has 2 children, who are grown. 2 granddaughters.  Complaining of persistent sinus symptoms despite having been treated with augmentin. Complaining of nasal congestion and facial pain.   Current Outpatient Medications on File Prior to Visit  Medication Sig Dispense Refill   albuterol (PROVENTIL) (2.5 MG/3ML) 0.083% nebulizer solution as needed.   6   albuterol (VENTOLIN HFA) 108 (90 Base) MCG/ACT inhaler Inhale 2 puffs into the lungs every 6 (six) hours as needed for wheezing or shortness of breath. 8 g 5   atorvastatin (LIPITOR) 40 MG tablet TAKE 1 TABLET(40 MG) BY MOUTH EVERY DAY 90 tablet 0   cyclobenzaprine (FLEXERIL) 5 MG tablet TAKE 1 TABLET(5 MG) BY MOUTH THREE TIMES DAILY AS NEEDED FOR MUSCLE SPASMS 90 tablet 0   dicyclomine (BENTYL) 10 MG capsule TAKE 1 CAPSULE(10 MG) BY MOUTH FOUR TIMES DAILY BEFORE MEALS AND AT BEDTIME 120 capsule 2   fluticasone (FLONASE) 50 MCG/ACT nasal spray Place 2 sprays into both nostrils daily. 16 g 6   furosemide (LASIX) 20 MG tablet TAKE 1 TABLET(20 MG) BY MOUTH DAILY 30 tablet 3   gabapentin (NEURONTIN) 300 MG capsule TAKE 1 CAPSULE(300 MG) BY MOUTH THREE TIMES DAILY 90  capsule 0   montelukast (SINGULAIR) 10 MG tablet Take 1 tablet (10 mg total) by mouth at bedtime. 90 tablet 0   Multiple Vitamins-Minerals (50+ ADULT EYE HEALTH PO) Take 1 tablet by mouth daily.     nitroGLYCERIN (NITROSTAT) 0.4 MG SL tablet DISSOLVE 1 TABLET BY MOUTH UNDER THE TONGUE AS NEEDED FOR CHEST PAIN EVERY 5 MINUTES UP TO 3 TIMES 25 tablet 0   pantoprazole (PROTONIX) 40 MG tablet TAKE 1 TABLET(40 MG) BY MOUTH DAILY 90 tablet 1   vitamin E 180 MG (400 UNITS) capsule Take by mouth.     No current facility-administered medications on file prior to visit.   Past Medical History:  Diagnosis Date   Allergic rhinitis    Arthritis    Blood transfusion without reported diagnosis    COPD (chronic obstructive pulmonary disease) (HCC)    GERD (gastroesophageal reflux disease)    History of colon polyps    HTN (hypertension)    Hyperlipidemia    Kidney stones    Pancreatitis    Pneumonia    Primary hypertension 10/20/2021   Psychotic depression ([place])    Shingles February 26th, 2001   Past Surgical History:  Procedure Laterality Date   ABDOMINAL HYSTERECTOMY     CESAREAN SECTION     COLONOSCOPY  05/02/2017   Colonic polyp status post polypectomy. Small internal hemorrhoids   GALLBLADDER SURGERY     REPLACEMENT TOTAL KNEE Right 02/09/2019   TOTAL  KNEE ARTHROPLASTY  02/09/2019   TUBAL LIGATION      Family History  Problem Relation Age of Onset   COPD Mother    Parkinson's disease Mother    COPD Father    Arthritis Father    Lung disease Father    Colon cancer Cousin        mother's cousin   Breast cancer Maternal Aunt        great aunt   Diabetes Other    Stroke Other    Hypertension Other    Hyperlipidemia Other    Asthma Other    Heart failure Other    Thyroid disease Other    Heart attack Other    COPD Other    Arrhythmia Other    Arthritis Other    Migraines Other    Social History   Socioeconomic History   Marital status: Widowed    Spouse name: Not on  file   Number of children: 2   Years of education: Not on file   Highest education level: High school graduate  Occupational History   Not on file  Tobacco Use   Smoking status: Every Day    Packs/day: 1.00    Types: Cigarettes   Smokeless tobacco: Never   Tobacco comments:    smokes a pack and a half daily 11.25.19  Vaping Use   Vaping Use: Never used  Substance and Sexual Activity   Alcohol use: Yes   Drug use: No   Sexual activity: Not on file  Other Topics Concern   Not on file  Social History Narrative   Not on file   Social Determinants of Health   Financial Resource Strain: Not on file  Food Insecurity: Not on file  Transportation Needs: Not on file  Physical Activity: Not on file  Stress: Not on file  Social Connections: Not on file    Review of Systems  Constitutional:  Positive for unexpected weight change. Negative for chills, fatigue and fever.  HENT:  Positive for congestion and ear pain (right). Negative for rhinorrhea and sore throat.   Respiratory:  Negative for cough and shortness of breath.   Cardiovascular:  Negative for chest pain.  Gastrointestinal:  Positive for constipation. Negative for abdominal pain, diarrhea, nausea and vomiting.  Endocrine: Positive for polydipsia and polyphagia.  Genitourinary:  Negative for dysuria and urgency.       Poor bladder control    Musculoskeletal:  Positive for arthralgias (right knee pain) and myalgias. Negative for back pain.  Neurological:  Positive for headaches (frontal). Negative for dizziness, weakness and light-headedness.  Psychiatric/Behavioral:  Negative for dysphoric mood. The patient is not nervous/anxious.     Objective:  BP 110/70    Pulse 79    Temp 98.5 F (36.9 C)    Resp 16    Wt 194 lb (88 kg)    LMP  (LMP Unknown) Comment: hysterectomy 2008   BMI 30.38 kg/m   BP/Weight 10/20/2021 2/63/7858 04/24/276  Systolic BP 412 878 -  Diastolic BP 70 70 -  Wt. (Lbs) 194 194 190  BMI 30.38 30.38  29.76    Physical Exam Vitals reviewed.  Constitutional:      Appearance: Normal appearance. She is obese.  HENT:     Right Ear: Tympanic membrane, ear canal and external ear normal.     Left Ear: Tympanic membrane, ear canal and external ear normal.     Nose: Congestion present.     Comments: Sinus  tenderness BL.     Mouth/Throat:     Pharynx: Oropharynx is clear.  Cardiovascular:     Rate and Rhythm: Normal rate and regular rhythm.     Heart sounds: Normal heart sounds. No murmur heard. Pulmonary:     Effort: Pulmonary effort is normal. No respiratory distress.     Breath sounds: Normal breath sounds.  Abdominal:     General: There is no distension.     Palpations: Abdomen is soft.     Tenderness: There is no abdominal tenderness.  Lymphadenopathy:     Cervical: No cervical adenopathy.  Neurological:     Mental Status: She is alert and oriented to person, place, and time.  Psychiatric:        Mood and Affect: Mood normal.        Behavior: Behavior normal.    Diabetic Foot Exam - Simple   No data filed      Lab Results  Component Value Date   WBC 7.0 10/28/2020   HGB 14.1 10/28/2020   HCT 42.6 10/28/2020   PLT 278 10/28/2020   GLUCOSE 111 (H) 10/28/2020   CHOL 207 (H) 10/28/2020   TRIG 163 (H) 10/28/2020   HDL 47 10/28/2020   LDLCALC 131 (H) 10/28/2020   ALT 38 (H) 10/28/2020   AST 24 10/28/2020   NA 140 10/28/2020   K 4.6 10/28/2020   CL 102 10/28/2020   CREATININE 0.55 (L) 10/28/2020   BUN 10 10/28/2020   CO2 23 10/28/2020   TSH 2.820 10/28/2020   INR 0.9 01/01/2020   HGBA1C 5.7 (H) 10/28/2020      Assessment & Plan:   Problem List Items Addressed This Visit       Cardiovascular and Mediastinum   Migraine    The current medical regimen is effective;  continue present plan and medications.       Relevant Medications   amLODipine (NORVASC) 10 MG tablet   buPROPion (WELLBUTRIN XL) 150 MG 24 hr tablet   Primary hypertension - Primary     Well controlled.  No changes to medicines.  Continue to work on eating a healthy diet and exercise.  Labs drawn today.        Relevant Medications   amLODipine (NORVASC) 10 MG tablet   Other Relevant Orders   Comprehensive metabolic panel   CBC with Differential/Platelet   TSH     Respiratory   Acute infection of nasal sinus    Rx for augmentin 875 mg one twice daily x 14 days.  Patient tends to need longer courses of antibiotics, likely due to smoking. Again recommend quit smoking.       Relevant Medications   amoxicillin-clavulanate (AUGMENTIN) 875-125 MG tablet   Moderate persistent asthma without complication    The current medical regimen is effective;  continue present plan and medications. Quit smoking.         Nervous and Auditory   Cigarette nicotine dependence with nicotine-induced disorder    Start on wellbutrin xl as above.  Recommend cessation.         Other   Mixed hyperlipidemia    Await labs/testing for assessment and recommendations. Continue to work on eating a healthy diet and exercise.  Labs drawn today.        Relevant Medications   amLODipine (NORVASC) 10 MG tablet   Other Relevant Orders   Lipid panel   GAD (generalized anxiety disorder)    Start on wellbutrin xl 150 mg once daily  in am x 1 week, then 150 mg 2 in am.  Refill xanax given.       Relevant Medications   ALPRAZolam (XANAX) 0.25 MG tablet   buPROPion (WELLBUTRIN XL) 150 MG 24 hr tablet   Prediabetes    Recommend continue to work on eating healthy diet and exercise. Start on metformin 500 mg once daily in am.       Relevant Orders   Hemoglobin A1c   Class 1 obesity due to excess calories with serious comorbidity and body mass index (BMI) of 30.0 to 30.9 in adult    Start on metformin for prediabetes. If this does not help with weight loss, consider ozempic. May return in 6 weeks to see Larene Beach, NP if wishes or return in 3 months fasting.       Relevant Medications    metFORMIN (GLUCOPHAGE) 500 MG tablet  .  Meds ordered this encounter  Medications   ALPRAZolam (XANAX) 0.25 MG tablet    Sig: TAKE 1 TABLET(0.25 MG) BY MOUTH DAILY AS NEEDED FOR ANXIETY    Dispense:  30 tablet    Refill:  2   amLODipine (NORVASC) 10 MG tablet    Sig: TAKE 1 TABLET(10 MG) BY MOUTH DAILY Strength: 10 mg    Dispense:  90 tablet    Refill:  1    ZERO refills remain on this prescription. Your patient is requesting advance approval of refills for this medication to PREVENT ANY MISSED DOSES   amoxicillin-clavulanate (AUGMENTIN) 875-125 MG tablet    Sig: Take 1 tablet by mouth 2 (two) times daily.    Dispense:  28 tablet    Refill:  0   buPROPion (WELLBUTRIN XL) 150 MG 24 hr tablet    Sig: Take 1 tablet (150 mg total) by mouth daily for 7 days, THEN 2 tablets (300 mg total) daily.    Dispense:  180 tablet    Refill:  0   metFORMIN (GLUCOPHAGE) 500 MG tablet    Sig: Take 1 tablet (500 mg total) by mouth daily with breakfast.    Dispense:  90 tablet    Refill:  0    Orders Placed This Encounter  Procedures   Comprehensive metabolic panel   Hemoglobin A1c   Lipid panel   CBC with Differential/Platelet   TSH     Follow-up: Return in about 3 months (around 01/17/2022) for chronic fasting.  An After Visit Summary was printed and given to the patient.  Rochel Brome, MD [facility] [phone]

## 2021-10-20 ENCOUNTER — Encounter: Payer: Self-pay | Admitting: Family Medicine

## 2021-10-20 ENCOUNTER — Ambulatory Visit: Payer: 59 | Admitting: Nurse Practitioner

## 2021-10-20 ENCOUNTER — Ambulatory Visit (INDEPENDENT_AMBULATORY_CARE_PROVIDER_SITE_OTHER): Payer: 59 | Admitting: Family Medicine

## 2021-10-20 ENCOUNTER — Other Ambulatory Visit: Payer: Self-pay

## 2021-10-20 VITALS — BP 110/70 | HR 79 | Temp 98.5°F | Resp 16 | Wt 194.0 lb

## 2021-10-20 DIAGNOSIS — R7303 Prediabetes: Secondary | ICD-10-CM | POA: Insufficient documentation

## 2021-10-20 DIAGNOSIS — J0191 Acute recurrent sinusitis, unspecified: Secondary | ICD-10-CM | POA: Insufficient documentation

## 2021-10-20 DIAGNOSIS — E6609 Other obesity due to excess calories: Secondary | ICD-10-CM | POA: Insufficient documentation

## 2021-10-20 DIAGNOSIS — I10 Essential (primary) hypertension: Secondary | ICD-10-CM | POA: Diagnosis not present

## 2021-10-20 DIAGNOSIS — J0111 Acute recurrent frontal sinusitis: Secondary | ICD-10-CM | POA: Insufficient documentation

## 2021-10-20 DIAGNOSIS — J019 Acute sinusitis, unspecified: Secondary | ICD-10-CM | POA: Insufficient documentation

## 2021-10-20 DIAGNOSIS — E782 Mixed hyperlipidemia: Secondary | ICD-10-CM

## 2021-10-20 DIAGNOSIS — J018 Other acute sinusitis: Secondary | ICD-10-CM

## 2021-10-20 DIAGNOSIS — Z6830 Body mass index (BMI) 30.0-30.9, adult: Secondary | ICD-10-CM

## 2021-10-20 DIAGNOSIS — J4540 Moderate persistent asthma, uncomplicated: Secondary | ICD-10-CM

## 2021-10-20 DIAGNOSIS — J0181 Other acute recurrent sinusitis: Secondary | ICD-10-CM | POA: Insufficient documentation

## 2021-10-20 DIAGNOSIS — F411 Generalized anxiety disorder: Secondary | ICD-10-CM

## 2021-10-20 DIAGNOSIS — U071 COVID-19: Secondary | ICD-10-CM | POA: Insufficient documentation

## 2021-10-20 DIAGNOSIS — G43909 Migraine, unspecified, not intractable, without status migrainosus: Secondary | ICD-10-CM | POA: Insufficient documentation

## 2021-10-20 DIAGNOSIS — G43809 Other migraine, not intractable, without status migrainosus: Secondary | ICD-10-CM | POA: Diagnosis not present

## 2021-10-20 DIAGNOSIS — F17219 Nicotine dependence, cigarettes, with unspecified nicotine-induced disorders: Secondary | ICD-10-CM

## 2021-10-20 HISTORY — DX: Essential (primary) hypertension: I10

## 2021-10-20 MED ORDER — METFORMIN HCL 500 MG PO TABS: 500.0000 mg | ORAL_TABLET | Freq: Every day | ORAL | 0 refills | Status: DC

## 2021-10-20 MED ORDER — AMOXICILLIN-POT CLAVULANATE 875-125 MG PO TABS: 1.0000 | ORAL_TABLET | Freq: Two times a day (BID) | ORAL | 0 refills | Status: DC

## 2021-10-20 MED ORDER — BUPROPION HCL ER (XL) 150 MG PO TB24: ORAL_TABLET | ORAL | 0 refills | Status: DC

## 2021-10-20 MED ORDER — AMLODIPINE BESYLATE 10 MG PO TABS: ORAL_TABLET | ORAL | 1 refills | Status: DC

## 2021-10-20 MED ORDER — ALPRAZOLAM 0.25 MG PO TABS: ORAL_TABLET | ORAL | 2 refills | Status: DC

## 2021-10-20 NOTE — Assessment & Plan Note (Signed)
The current medical regimen is effective;  continue present plan and medications.  

## 2021-10-20 NOTE — Assessment & Plan Note (Signed)
Start on wellbutrin xl as above.  Recommend cessation.

## 2021-10-20 NOTE — Assessment & Plan Note (Signed)
Start on metformin for prediabetes. If this does not help with weight loss, consider ozempic. May return in 6 weeks to see Larene Beach, NP if wishes or return in 3 months fasting.

## 2021-10-20 NOTE — Assessment & Plan Note (Signed)
Recommend continue to work on eating healthy diet and exercise. Start on metformin 500 mg once daily in am.

## 2021-10-20 NOTE — Patient Instructions (Signed)
Start on metformin 500 mg once daily in am.  Start on wellbutrin xl 150 mg once daily in am x 1 week, then 150 mg 2 in am.

## 2021-10-20 NOTE — Assessment & Plan Note (Signed)
>>  ASSESSMENT AND PLAN FOR MODERATE PERSISTENT ASTHMA WITHOUT COMPLICATION/COPD WRITTEN ON 10/20/2021  2:16 PM BY COX, KIRSTEN, MD  The current medical regimen is effective;  continue present plan and medications. Quit smoking.

## 2021-10-20 NOTE — Assessment & Plan Note (Signed)
Await labs/testing for assessment and recommendations. Continue to work on eating a healthy diet and exercise.  Labs drawn today.   

## 2021-10-20 NOTE — Assessment & Plan Note (Signed)
Well controlled.  ?No changes to medicines.  ?Continue to work on eating a healthy diet and exercise.  ?Labs drawn today.  ?

## 2021-10-20 NOTE — Assessment & Plan Note (Signed)
Rx for augmentin 875 mg one twice daily x 14 days.  Patient tends to need longer courses of antibiotics, likely due to smoking. Again recommend quit smoking.

## 2021-10-20 NOTE — Assessment & Plan Note (Signed)
The current medical regimen is effective;  continue present plan and medications. Quit smoking.

## 2021-10-20 NOTE — Assessment & Plan Note (Signed)
Start on wellbutrin xl 150 mg once daily in am x 1 week, then 150 mg 2 in am.  Refill xanax given.

## 2021-10-21 ENCOUNTER — Other Ambulatory Visit: Payer: Self-pay

## 2021-10-21 LAB — LIPID PANEL
Chol/HDL Ratio: 4.2 ratio (ref 0.0–4.4)
Cholesterol, Total: 204 mg/dL — ABNORMAL HIGH (ref 100–199)
HDL: 49 mg/dL (ref >39)
LDL Chol Calc (NIH): 129 mg/dL — ABNORMAL HIGH (ref 0–99)
Triglycerides: 143 mg/dL (ref 0–149)
VLDL Cholesterol Cal: 26 mg/dL (ref 5–40)

## 2021-10-21 LAB — CBC WITH DIFFERENTIAL/PLATELET
Basophils Absolute: 0.1 10*3/uL (ref 0.0–0.2)
Basos: 1 %
EOS (ABSOLUTE): 0.4 10*3/uL (ref 0.0–0.4)
Eos: 6 %
Hematocrit: 44.5 % (ref 34.0–46.6)
Hemoglobin: 15.2 g/dL (ref 11.1–15.9)
Immature Grans (Abs): 0 10*3/uL (ref 0.0–0.1)
Immature Granulocytes: 0 %
Lymphocytes Absolute: 1.9 10*3/uL (ref 0.7–3.1)
Lymphs: 29 %
MCH: 32.2 pg (ref 26.6–33.0)
MCHC: 34.2 g/dL (ref 31.5–35.7)
MCV: 94 fL (ref 79–97)
Monocytes Absolute: 0.5 10*3/uL (ref 0.1–0.9)
Monocytes: 7 %
Neutrophils Absolute: 3.6 10*3/uL (ref 1.4–7.0)
Neutrophils: 57 %
Platelets: 274 10*3/uL (ref 150–450)
RBC: 4.72 x10E6/uL (ref 3.77–5.28)
RDW: 12 % (ref 11.7–15.4)
WBC: 6.4 10*3/uL (ref 3.4–10.8)

## 2021-10-21 LAB — COMPREHENSIVE METABOLIC PANEL
ALT: 36 IU/L — ABNORMAL HIGH (ref 0–32)
AST: 18 IU/L (ref 0–40)
Albumin/Globulin Ratio: 2.1 (ref 1.2–2.2)
Albumin: 4.5 g/dL (ref 3.8–4.9)
Alkaline Phosphatase: 83 IU/L (ref 44–121)
BUN/Creatinine Ratio: 23 (ref 9–23)
BUN: 13 mg/dL (ref 6–24)
Bilirubin Total: 0.3 mg/dL (ref 0.0–1.2)
CO2: 22 mmol/L (ref 20–29)
Calcium: 9.8 mg/dL (ref 8.7–10.2)
Chloride: 101 mmol/L (ref 96–106)
Creatinine, Ser: 0.57 mg/dL (ref 0.57–1.00)
Globulin, Total: 2.1 g/dL (ref 1.5–4.5)
Glucose: 109 mg/dL — ABNORMAL HIGH (ref 70–99)
Potassium: 4.1 mmol/L (ref 3.5–5.2)
Sodium: 140 mmol/L (ref 134–144)
Total Protein: 6.6 g/dL (ref 6.0–8.5)
eGFR: 107 mL/min/{1.73_m2} (ref >59)

## 2021-10-21 LAB — HEMOGLOBIN A1C
Est. average glucose Bld gHb Est-mCnc: 117 mg/dL
Hgb A1c MFr Bld: 5.7 % — ABNORMAL HIGH (ref 4.8–5.6)

## 2021-10-21 LAB — CARDIOVASCULAR RISK ASSESSMENT

## 2021-10-21 LAB — TSH: TSH: 3.12 u[IU]/mL (ref 0.450–4.500)

## 2021-10-21 MED ORDER — ATORVASTATIN CALCIUM 80 MG PO TABS: 80.0000 mg | ORAL_TABLET | Freq: Every day | ORAL | 3 refills | Status: DC

## 2021-10-22 ENCOUNTER — Other Ambulatory Visit: Payer: Self-pay

## 2021-10-22 MED ORDER — BUPROPION HCL ER (XL) 300 MG PO TB24: 300.0000 mg | ORAL_TABLET | Freq: Every day | ORAL | 0 refills | Status: DC

## 2021-10-22 NOTE — Telephone Encounter (Signed)
Prior Josem Kaufmann was done for patient on Buproprion XL 150mg  1 tablet TWICE DAILY and the insurance would not cover it. Per Dr. Tobie Poet send Buproprion XL 300mg  1 tabet once daily and inform patient to take 1/2 tablet for one week and resume to 1 tablet daily. Called patient and she was informed and script was sent to the pharmacy.

## 2021-10-25 ENCOUNTER — Other Ambulatory Visit: Payer: Self-pay | Admitting: Family Medicine

## 2021-10-25 DIAGNOSIS — J309 Allergic rhinitis, unspecified: Secondary | ICD-10-CM

## 2021-10-27 ENCOUNTER — Ambulatory Visit: Payer: 59 | Admitting: Nurse Practitioner

## 2021-11-03 ENCOUNTER — Other Ambulatory Visit: Payer: Self-pay | Admitting: Family Medicine

## 2021-11-03 DIAGNOSIS — J309 Allergic rhinitis, unspecified: Secondary | ICD-10-CM

## 2021-11-04 ENCOUNTER — Other Ambulatory Visit: Payer: Self-pay | Admitting: Nurse Practitioner

## 2021-11-04 DIAGNOSIS — G629 Polyneuropathy, unspecified: Secondary | ICD-10-CM

## 2021-11-06 ENCOUNTER — Other Ambulatory Visit: Payer: Self-pay | Admitting: Family Medicine

## 2021-11-06 NOTE — Telephone Encounter (Signed)
Refill sent to pharmacy.   

## 2021-11-13 ENCOUNTER — Ambulatory Visit (INDEPENDENT_AMBULATORY_CARE_PROVIDER_SITE_OTHER): Payer: 59 | Admitting: Family Medicine

## 2021-11-13 ENCOUNTER — Encounter: Payer: Self-pay | Admitting: Family Medicine

## 2021-11-13 VITALS — BP 128/78 | HR 78 | Temp 97.8°F | Ht 67.0 in | Wt 191.8 lb

## 2021-11-13 DIAGNOSIS — J209 Acute bronchitis, unspecified: Secondary | ICD-10-CM | POA: Diagnosis not present

## 2021-11-13 DIAGNOSIS — H6691 Otitis media, unspecified, right ear: Secondary | ICD-10-CM | POA: Insufficient documentation

## 2021-11-13 DIAGNOSIS — H65191 Other acute nonsuppurative otitis media, right ear: Secondary | ICD-10-CM | POA: Diagnosis not present

## 2021-11-13 DIAGNOSIS — J329 Chronic sinusitis, unspecified: Secondary | ICD-10-CM | POA: Insufficient documentation

## 2021-11-13 DIAGNOSIS — J440 Chronic obstructive pulmonary disease with acute lower respiratory infection: Secondary | ICD-10-CM

## 2021-11-13 DIAGNOSIS — R7303 Prediabetes: Secondary | ICD-10-CM

## 2021-11-13 DIAGNOSIS — J320 Chronic maxillary sinusitis: Secondary | ICD-10-CM | POA: Diagnosis not present

## 2021-11-13 MED ORDER — TRIAMCINOLONE ACETONIDE 40 MG/ML IJ SUSP
80.0000 mg | Freq: Once | INTRAMUSCULAR | Status: AC
  Administered 2021-11-13: 80 mg via INTRAMUSCULAR

## 2021-11-13 MED ORDER — CEFTRIAXONE SODIUM 1 G IJ SOLR
1.0000 g | Freq: Once | INTRAMUSCULAR | Status: AC
  Administered 2021-11-13: 1 g via INTRAMUSCULAR

## 2021-11-13 MED ORDER — BREZTRI AEROSPHERE 160-9-4.8 MCG/ACT IN AERO: 2.0000 | INHALATION_SPRAY | Freq: Two times a day (BID) | RESPIRATORY_TRACT | 0 refills | Status: DC

## 2021-11-13 MED ORDER — OZEMPIC (0.25 OR 0.5 MG/DOSE) 2 MG/1.5ML ~~LOC~~ SOPN: 0.2500 mg | PEN_INJECTOR | SUBCUTANEOUS | 0 refills | Status: DC

## 2021-11-13 MED ORDER — SULFAMETHOXAZOLE-TRIMETHOPRIM 800-160 MG PO TABS: 1.0000 | ORAL_TABLET | Freq: Two times a day (BID) | ORAL | 0 refills | Status: DC

## 2021-11-13 NOTE — Patient Instructions (Addendum)
Chronic Sinus infection: Given Bactrim DS 1 twice daily for 30 days.  I am concerned the patient has a chronic sinus infection. If recurs, I would recommend CT scan of the sinuses and/or referral to ear nose and throat specialist. Right ear infection: Given Bactrim. Acute bronchitis with underlying COPD: Started on Breztri 2 puffs twice daily.  Use albuterol 2 puffs 4 times a day as needed. Kenalog 80 mg IM shot given Rocephin 1 g IM shot given.  Prediabetes: Started on Ozempic 0.25 mg once weekly for 4 weeks.  Follow-up 4 weeks after beginning the medication.  I anticipate difficulty obtaining it at the pharmacy as it is on back order.  You must quit smoking or infections are going to continue to occur.

## 2021-11-13 NOTE — Assessment & Plan Note (Addendum)
Rx: Bactrim DS Rocephin 1 g IM shot given.

## 2021-11-13 NOTE — Progress Notes (Signed)
Acute Office Visit  Subjective:    Patient ID: [name], female    DOB: [birth year], 56 y.o.   MRN: [id number]  Chief Complaint  Patient presents with   Cough    CONGESTION/EAR PAIN   HPI: Patient is in today for right earache, nasal congestion, coughing at night.  Patient has been treated for sinusitis 10/20/21 with augmentin x 14 days,   09/25/21 with augmentin x 10 days, 08/05/2021 augmentin x 10 days, 04/14/2022 with augmentin x 10 days.  Now it has moved in to her chest. Coughing yellowish.  No fevers.   Intolerant to wellbutrin. Caused insomnia. Requesting ozempic for prediabetes and weight loss.   Past Medical History:  Diagnosis Date   Allergic rhinitis    Arthritis    Blood transfusion without reported diagnosis    COPD (chronic obstructive pulmonary disease) (HCC)    GERD (gastroesophageal reflux disease)    History of colon polyps    HTN (hypertension)    Hyperlipidemia    Kidney stones    Pancreatitis    Pneumonia    Primary hypertension 10/20/2021   Psychotic depression ([place])    Shingles February 26th, 2001    Past Surgical History:  Procedure Laterality Date   ABDOMINAL HYSTERECTOMY     CESAREAN SECTION     COLONOSCOPY  05/02/2017   Colonic polyp status post polypectomy. Small internal hemorrhoids   GALLBLADDER SURGERY     REPLACEMENT TOTAL KNEE Right 02/09/2019   TOTAL KNEE ARTHROPLASTY  02/09/2019   TUBAL LIGATION      Family History  Problem Relation Age of Onset   COPD Mother    Parkinson's disease Mother    COPD Father    Arthritis Father    Lung disease Father    Colon cancer Cousin        mother's cousin   Breast cancer Maternal Aunt        great aunt   Diabetes Other    Stroke Other    Hypertension Other    Hyperlipidemia Other    Asthma Other    Heart failure Other    Thyroid disease Other    Heart attack Other    COPD Other    Arrhythmia Other    Arthritis Other    Migraines Other     Social History    Socioeconomic History   Marital status: Widowed    Spouse name: Not on file   Number of children: 2   Years of education: Not on file   Highest education level: High school graduate  Occupational History   Not on file  Tobacco Use   Smoking status: Every Day    Packs/day: 1.00    Types: Cigarettes   Smokeless tobacco: Never   Tobacco comments:    smokes a pack and a half daily 11.25.19  Vaping Use   Vaping Use: Never used  Substance and Sexual Activity   Alcohol use: Yes   Drug use: No   Sexual activity: Not on file  Other Topics Concern   Not on file  Social History Narrative   Not on file   Social Determinants of Health   Financial Resource Strain: Not on file  Food Insecurity: Not on file  Transportation Needs: Not on file  Physical Activity: Not on file  Stress: Not on file  Social Connections: Not on file  Intimate Partner Violence: Not on file    Outpatient Medications Prior to Visit  Medication Sig Dispense Refill  albuterol (PROVENTIL) (2.5 MG/3ML) 0.083% nebulizer solution as needed.   6   ALPRAZolam (XANAX) 0.25 MG tablet TAKE 1 TABLET(0.25 MG) BY MOUTH DAILY AS NEEDED FOR ANXIETY 30 tablet 2   amLODipine (NORVASC) 10 MG tablet TAKE 1 TABLET(10 MG) BY MOUTH DAILY Strength: 10 mg 90 tablet 1   atorvastatin (LIPITOR) 80 MG tablet Take 1 tablet (80 mg total) by mouth daily. 90 tablet 3   cyclobenzaprine (FLEXERIL) 5 MG tablet TAKE 1 TABLET(5 MG) BY MOUTH THREE TIMES DAILY AS NEEDED FOR MUSCLE SPASMS 90 tablet 0   dicyclomine (BENTYL) 10 MG capsule TAKE 1 CAPSULE(10 MG) BY MOUTH FOUR TIMES DAILY BEFORE MEALS AND AT BEDTIME 120 capsule 2   fluticasone (FLONASE) 50 MCG/ACT nasal spray Place 2 sprays into both nostrils daily. 16 g 6   furosemide (LASIX) 20 MG tablet TAKE 1 TABLET(20 MG) BY MOUTH DAILY 30 tablet 3   gabapentin (NEURONTIN) 300 MG capsule TAKE 1 CAPSULE(300 MG) BY MOUTH THREE TIMES DAILY 90 capsule 0   metFORMIN (GLUCOPHAGE) 500 MG tablet Take 1  tablet (500 mg total) by mouth daily with breakfast. 90 tablet 0   montelukast (SINGULAIR) 10 MG tablet TAKE 1 TABLET(10 MG) BY MOUTH DAILY 90 tablet 0   Multiple Vitamins-Minerals (50+ ADULT EYE HEALTH PO) Take 1 tablet by mouth daily.     nitroGLYCERIN (NITROSTAT) 0.4 MG SL tablet DISSOLVE 1 TABLET BY MOUTH UNDER THE TONGUE AS NEEDED FOR CHEST PAIN EVERY 5 MINUTES UP TO 3 TIMES 25 tablet 0   pantoprazole (PROTONIX) 40 MG tablet TAKE 1 TABLET(40 MG) BY MOUTH DAILY 90 tablet 1   vitamin E 180 MG (400 UNITS) capsule Take by mouth.     albuterol (VENTOLIN HFA) 108 (90 Base) MCG/ACT inhaler Inhale 2 puffs into the lungs every 6 (six) hours as needed for wheezing or shortness of breath. 8 g 5   amoxicillin-clavulanate (AUGMENTIN) 875-125 MG tablet Take 1 tablet by mouth 2 (two) times daily. 28 tablet 0   buPROPion (WELLBUTRIN XL) 300 MG 24 hr tablet Take 1 tablet (300 mg total) by mouth daily. 90 tablet 0   No facility-administered medications prior to visit.    Allergies  Allergen Reactions   Levofloxacin Other (See Comments) and Rash    Rash all over redness    Bactrim Ds [Sulfamethoxazole-Trimethoprim] Nausea Only   Levaquin [Levofloxacin In D5w]    Tape Hives   Codeine Nausea Only   Other Rash    bandaids leave a red area if left on too long    Review of Systems  Constitutional:  Negative for fatigue and fever.  HENT:  Positive for congestion and ear pain (Right ear). Negative for sinus pressure and sore throat.   Eyes:  Negative for pain.  Respiratory:  Positive for cough. Negative for chest tightness, shortness of breath and wheezing.   Cardiovascular:  Negative for chest pain and palpitations.  Gastrointestinal:  Negative for abdominal pain, constipation, diarrhea, nausea and vomiting.  Genitourinary:  Negative for dysuria and hematuria.  Musculoskeletal:  Negative for arthralgias, back pain, joint swelling and myalgias.  Skin:  Negative for rash.  Neurological:  Negative for  dizziness, weakness and headaches.  Psychiatric/Behavioral:  Negative for dysphoric mood. The patient is not nervous/anxious.       Objective:    Physical Exam Vitals reviewed.  Constitutional:      Appearance: Normal appearance. She is normal weight.  HENT:     Left Ear: Tympanic membrane, ear canal and  external ear normal.     Ears:     Comments: Right TM erythema.     Nose: Congestion present.     Comments: Maxillary sinus tenderness BL.     Mouth/Throat:     Pharynx: Oropharynx is clear.  Neck:     Vascular: No carotid bruit.  Cardiovascular:     Rate and Rhythm: Normal rate and regular rhythm.     Heart sounds: Normal heart sounds. No murmur heard. Pulmonary:     Effort: Pulmonary effort is normal. No respiratory distress.     Breath sounds: Wheezing present.  Lymphadenopathy:     Cervical: No cervical adenopathy.  Neurological:     Mental Status: She is alert and oriented to person, place, and time.  Psychiatric:        Mood and Affect: Mood normal.        Behavior: Behavior normal.    BP 128/78 (BP Location: Right Arm, Patient Position: Sitting)    Pulse 78    Temp 97.8 F (36.6 C) (Oral)    Ht $R'5\' 7"'Rv$  (1.702 m)    Wt 191 lb 12.8 oz (87 kg)    LMP  (LMP Unknown) Comment: hysterectomy 2008   SpO2 98%    BMI 30.04 kg/m  Wt Readings from Last 3 Encounters:  11/13/21 191 lb 12.8 oz (87 kg)  10/20/21 194 lb (88 kg)  10/05/21 194 lb (88 kg)    Health Maintenance Due  Topic Date Due   TETANUS/TDAP  Never done   PAP SMEAR-Modifier  04/05/2017    There are no preventive care reminders to display for this patient.   Lab Results  Component Value Date   TSH 3.120 10/20/2021   Lab Results  Component Value Date   WBC 6.4 10/20/2021   HGB 15.2 10/20/2021   HCT 44.5 10/20/2021   MCV 94 10/20/2021   PLT 274 10/20/2021   Lab Results  Component Value Date   NA 140 10/20/2021   K 4.1 10/20/2021   CO2 22 10/20/2021   GLUCOSE 109 (H) 10/20/2021   BUN 13  10/20/2021   CREATININE 0.57 10/20/2021   BILITOT 0.3 10/20/2021   ALKPHOS 83 10/20/2021   AST 18 10/20/2021   ALT 36 (H) 10/20/2021   PROT 6.6 10/20/2021   ALBUMIN 4.5 10/20/2021   CALCIUM 9.8 10/20/2021   EGFR 107 10/20/2021   Lab Results  Component Value Date   CHOL 204 (H) 10/20/2021   Lab Results  Component Value Date   HDL 49 10/20/2021   Lab Results  Component Value Date   LDLCALC 129 (H) 10/20/2021   Lab Results  Component Value Date   TRIG 143 10/20/2021   Lab Results  Component Value Date   CHOLHDL 4.2 10/20/2021   Lab Results  Component Value Date   HGBA1C 5.7 (H) 10/20/2021         Assessment & Plan:   Problem List Items Addressed This Visit       Respiratory   Chronic sinusitis    Given Bactrim DS 1 twice daily for 30 days.  I am concerned the patient has a chronic sinus infection. If recurs, I would recommend CT scan of the sinuses and/or referral to ear nose and throat specialist.  You must quit smoking or infections are going to continue to occur.       Acute bronchitis with COPD ([place]) - Primary    Started on Breztri 2 puffs twice daily.  Use albuterol 2  puffs 4 times a day as needed. Kenalog 80 mg IM shot given Rocephin 1 g IM shot given.       Relevant Medications   Budeson-Glycopyrrol-Formoterol (BREZTRI AEROSPHERE) 160-9-4.8 MCG/ACT AERO     Nervous and Auditory   Right otitis media    Rx: Bactrim DS Rocephin 1 g IM shot given.         Other   Prediabetes    Hemoglobin A1c 5.7%, 3 month avg of blood sugars, is in prediabetic range.  In order to prevent progression to diabetes, recommend low carb diet and regular exercise. Ozempic 0.25 mg into muscle weekly.       Meds ordered this encounter  Medications   DISCONTD: sulfamethoxazole-trimethoprim (BACTRIM DS) 800-160 MG tablet    Sig: Take 1 tablet by mouth 2 (two) times daily.    Dispense:  60 tablet    Refill:  0   Budeson-Glycopyrrol-Formoterol (BREZTRI  AEROSPHERE) 160-9-4.8 MCG/ACT AERO    Sig: Inhale 2 puffs into the lungs in the morning and at bedtime.    Dispense:  5.9 g    Refill:  0   Semaglutide,0.25 or 0.5MG /DOS, (OZEMPIC, 0.25 OR 0.5 MG/DOSE,) 2 MG/1.5ML SOPN    Sig: Inject 0.25 mg into the skin once a week.    Dispense:  1.5 mL    Refill:  0   triamcinolone acetonide (KENALOG-40) injection 80 mg   cefTRIAXone (ROCEPHIN) injection 1 g   I,Lauren M Auman,acting as a scribe for Rochel Brome, MD.,have documented all relevant documentation on the behalf of Rochel Brome, MD,as directed by  Rochel Brome, MD while in the presence of Rochel Brome, MD.   Follow up: as needed   Rochel Brome, MD

## 2021-11-19 ENCOUNTER — Other Ambulatory Visit: Payer: Self-pay | Admitting: Family Medicine

## 2021-11-19 ENCOUNTER — Telehealth: Payer: Self-pay

## 2021-11-19 DIAGNOSIS — U071 COVID-19: Secondary | ICD-10-CM

## 2021-11-19 DIAGNOSIS — J208 Acute bronchitis due to other specified organisms: Secondary | ICD-10-CM

## 2021-11-19 DIAGNOSIS — J441 Chronic obstructive pulmonary disease with (acute) exacerbation: Secondary | ICD-10-CM

## 2021-11-19 MED ORDER — DOXYCYCLINE HYCLATE 100 MG PO TABS: 100.0000 mg | ORAL_TABLET | Freq: Two times a day (BID) | ORAL | 0 refills | Status: DC

## 2021-11-19 NOTE — Telephone Encounter (Signed)
Patient aware.  ? ?[name] 11/19/21 4:51 PM ? ?

## 2021-11-19 NOTE — Telephone Encounter (Signed)
General/Other - reaction to bactrim  ? ?She was prescribed bactrim on 2/24 for 30 day supply twice daily. She noticed nausea, lasting all day. She stopped medication yesterday and has felt better since. She would like another medication sent if possible.  ? ?[place] ?Callback: [phone] ? ?Harrell Lark 11/19/21 4:05 PM ? ? ?

## 2021-11-22 DIAGNOSIS — J449 Chronic obstructive pulmonary disease, unspecified: Secondary | ICD-10-CM | POA: Insufficient documentation

## 2021-11-22 DIAGNOSIS — J209 Acute bronchitis, unspecified: Secondary | ICD-10-CM | POA: Insufficient documentation

## 2021-11-22 DIAGNOSIS — J440 Chronic obstructive pulmonary disease with acute lower respiratory infection: Secondary | ICD-10-CM | POA: Insufficient documentation

## 2021-11-22 DIAGNOSIS — J441 Chronic obstructive pulmonary disease with (acute) exacerbation: Secondary | ICD-10-CM | POA: Insufficient documentation

## 2021-11-22 NOTE — Assessment & Plan Note (Addendum)
Started on Breztri 2 puffs twice daily.  Use albuterol 2 puffs 4 times a day as needed. ?Kenalog 80 mg IM shot given ?Rocephin 1 g IM shot given. ? ?

## 2021-11-22 NOTE — Assessment & Plan Note (Signed)
Given Bactrim DS 1 twice daily for 30 days.  I am concerned the patient has a chronic sinus infection. ?If recurs, I would recommend CT scan of the sinuses and/or referral to ear nose and throat specialist. ? ?You must quit smoking or infections are going to continue to occur.  ?

## 2021-11-22 NOTE — Assessment & Plan Note (Addendum)
Hemoglobin A1c 5.7%, 3 month avg of blood sugars, is in prediabetic range.  In order to prevent progression to diabetes, recommend low carb diet and regular exercise. ?Ozempic 0.25 mg into muscle weekly. ?

## 2021-11-29 ENCOUNTER — Other Ambulatory Visit: Payer: Self-pay | Admitting: Physician Assistant

## 2021-12-01 ENCOUNTER — Encounter: Payer: Self-pay | Admitting: Nurse Practitioner

## 2021-12-01 ENCOUNTER — Ambulatory Visit (INDEPENDENT_AMBULATORY_CARE_PROVIDER_SITE_OTHER): Payer: 59 | Admitting: Nurse Practitioner

## 2021-12-01 ENCOUNTER — Other Ambulatory Visit: Payer: Self-pay

## 2021-12-01 VITALS — BP 120/68 | HR 79 | Temp 97.2°F | Ht 66.0 in | Wt 190.0 lb

## 2021-12-01 DIAGNOSIS — Z23 Encounter for immunization: Secondary | ICD-10-CM

## 2021-12-01 DIAGNOSIS — W548XXA Other contact with dog, initial encounter: Secondary | ICD-10-CM | POA: Diagnosis not present

## 2021-12-01 DIAGNOSIS — S50811A Abrasion of right forearm, initial encounter: Secondary | ICD-10-CM | POA: Diagnosis not present

## 2021-12-01 DIAGNOSIS — K130 Diseases of lips: Secondary | ICD-10-CM | POA: Diagnosis not present

## 2021-12-01 MED ORDER — MUPIROCIN CALCIUM 2 % EX CREA: 1.0000 "application " | TOPICAL_CREAM | Freq: Two times a day (BID) | CUTANEOUS | 0 refills | Status: DC

## 2021-12-01 MED ORDER — TRIAMCINOLONE ACETONIDE 0.025 % EX OINT: 1.0000 "application " | TOPICAL_OINTMENT | Freq: Two times a day (BID) | CUTANEOUS | 0 refills | Status: DC

## 2021-12-01 NOTE — Patient Instructions (Addendum)
Continue Doxycycline 100 mg twice daily  ?Tdap given in office ?Apply Triamcinolone ointment to corners of mouth twice daily ?Apply Bactroban ointment to right arm laceration twice daily ?Follow-up as needed ? ? ? ?Tdap (Tetanus, Diphtheria, Pertussis) Vaccine: What You Need to Know ?1. Why get vaccinated? ?Tdap vaccine can prevent tetanus, diphtheria, and pertussis. ?Diphtheria and pertussis spread from person to person. Tetanus enters the body through cuts or wounds. ?TETANUS (T) causes painful stiffening of the muscles. Tetanus can lead to serious health problems, including being unable to open the mouth, having trouble swallowing and breathing, or death. ?DIPHTHERIA (D) can lead to difficulty breathing, heart failure, paralysis, or death. ?PERTUSSIS (aP), also known as "whooping cough," can cause uncontrollable, violent coughing that makes it hard to breathe, eat, or drink. Pertussis can be extremely serious especially in babies and young children, causing pneumonia, convulsions, brain damage, or death. In teens and adults, it can cause weight loss, loss of bladder control, passing out, and rib fractures from severe coughing. ?2. Tdap vaccine ?Tdap is only for children 7 years and older, adolescents, and adults.  ?Adolescents should receive a single dose of Tdap, preferably at age 42 or 73 years. ?Pregnant people should get a dose of Tdap during every pregnancy, preferably during the early part of the third trimester, to help protect the newborn from pertussis. Infants are most at risk for severe, life-threatening complications from pertussis. ?Adults who have never received Tdap should get a dose of Tdap. ?Also, adults should receive a booster dose of either Tdap or Td (a different vaccine that protects against tetanus and diphtheria but not pertussis) every 10 years, or after 5 years in the case of a severe or dirty wound or burn. ?Tdap may be given at the same time as other vaccines. ?3. Talk with your health  care provider ?Tell your vaccine provider if the person getting the vaccine: ?Has had an allergic reaction after a previous dose of any vaccine that protects against tetanus, diphtheria, or pertussis, or has any severe, life-threatening allergies ?Has had a coma, decreased level of consciousness, or prolonged seizures within 7 days after a previous dose of any pertussis vaccine (DTP, DTaP, or Tdap) ?Has seizures or another nervous system problem ?Has ever had Guillain-Barr? Syndrome (also called "GBS") ?Has had severe pain or swelling after a previous dose of any vaccine that protects against tetanus or diphtheria ?In some cases, your health care provider may decide to postpone Tdap vaccination until a future visit. ?People with minor illnesses, such as a cold, may be vaccinated. People who are moderately or severely ill should usually wait until they recover before getting Tdap vaccine.  ?Your health care provider can give you more information. ?4. Risks of a vaccine reaction ?Pain, redness, or swelling where the shot was given, mild fever, headache, feeling tired, and nausea, vomiting, diarrhea, or stomachache sometimes happen after Tdap vaccination. ?People sometimes faint after medical procedures, including vaccination. Tell your provider if you feel dizzy or have vision changes or ringing in the ears.  ?As with any medicine, there is a very remote chance of a vaccine causing a severe allergic reaction, other serious injury, or death. ?5. What if there is a serious problem? ?An allergic reaction could occur after the vaccinated person leaves the clinic. If you see signs of a severe allergic reaction (hives, swelling of the face and throat, difficulty breathing, a fast heartbeat, dizziness, or weakness), call 9-1-1 and get the person to the nearest hospital. ?For  other signs that concern you, call your health care provider.  ?Adverse reactions should be reported to the Vaccine Adverse Event Reporting System  (VAERS). Your health care provider will usually file this report, or you can do it yourself. Visit the VAERS website at www.vaers.SamedayNews.es or call 832-609-8516. VAERS is only for reporting reactions, and VAERS staff members do not give medical advice. ?6. The National Vaccine Injury Compensation Program ?The National Vaccine Injury Compensation Program (VICP) is a federal program that was created to compensate people who may have been injured by certain vaccines. Claims regarding alleged injury or death due to vaccination have a time limit for filing, which may be as short as two years. Visit the VICP website at GoldCloset.com.ee or call 812-247-2399 to learn about the program and about filing a claim. ?7. How can I learn more? ?Ask your health care provider. ?Call your local or state health department. ?Visit the website of the Food and Drug Administration (FDA) for vaccine package inserts and additional information at TraderRating.uy. ?Contact the Centers for Disease Control and Prevention (CDC): ?Call 904-492-1875 (1-800-CDC-INFO) or ?Visit CDC's website at http://hunter.com/. ?Vaccine Information Statement Tdap (Tetanus, Diphtheria, Pertussis) Vaccine (04/25/2020) ?This information is not intended to replace advice given to you by your health care provider. Make sure you discuss any questions you have with your health care provider. ?Document Revised: 05/21/2020 Document Reviewed: 05/21/2020 ?Elsevier Patient Education ? [place]. ? ?Laceration Care, Adult ?A laceration is a cut that may go through all layers of the skin. The cut may also go into the tissue that is right under the skin. Some cuts heal on their own. Other cuts need to be closed with stitches (sutures), staples, skin adhesive strips, or skin glue. Taking care of your cut lowers your risk of infection, helps your injury heal better, and may prevent scarring. ?General tips ?Keep your wound  clean and dry. ?Do not scratch or pick at your wound. ?Wash your hands with soap and water for at least 20 seconds before and after touching your wound or changing your bandage (dressing). If you cannot use soap and water, use hand sanitizer. ?Do not usedisinfectants or antiseptics, such as rubbing alcohol, to clean your wound unless told by your doctor. ?If you were given a bandage, change it at least once a day, or as told by your doctor. You should also change it if it gets wet or dirty. ?How to take care of your cut ?If your doctor used stitches or staples: ?Keep the wound fully dry for the first 24 hours, or as told by your doctor. After that, you may take a shower or a bath. Do not soak the wound in water until after the stitches or staples have been taken out. ?Clean the wound once a day, or as told by your doctor. To do this: ?Wash the wound with soap and water. ?Rinse the wound with water to remove all soap. ?Pat the wound dry with a clean towel. Do not rub the wound. ?After you clean the wound, put a thin layer of antibiotic ointment, another ointment, or a nonstick bandage on it as told by your doctor. This will help to: ?Prevent infection. ?Keep the bandage from sticking to the wound. ?Have your stitches or staples taken out as told by your doctor. ?If your doctor used skin adhesive strips: ?Do not get the skin adhesive strips wet. You can take a shower or a bath, but keep the wound dry. ?If the wound gets wet,  pat it dry with a clean towel. Do not rub the wound. ?Skin adhesive strips fall off on their own. You can trim the strips as the wound heals. Do not take off any strips that are still stuck to the wound unless told by your doctor. The strips will fall off after a while. ?If your doctor used skin glue: ?You may take a shower or a bath, but try to keep the wound dry. Do not soak the wound in water. ?After you take a shower or a bath, pat the wound dry with a clean towel. Do not rub the wound. ?Do  not do any activities that will make you sweat a lot until the skin glue has fallen off. ?Do not apply liquid, cream, or ointment medicine to your wound while the skin glue is still on. ?If a bandage is pl

## 2021-12-01 NOTE — Progress Notes (Signed)
? ?Acute Office Visit ? ?Subjective:  ? ? Patient ID: [name], female    DOB: 03-Nov-1965, 56 y.o.   MRN: [id number] ? ?Chief Complaint  ?Patient presents with  ? Dog Scratch  ? ? ?HPI: ?[name] is a 56 year old Caucasian female that presents for evaluation of right forearm abrasions x 2. States her dog scratched her 3-days ago. Denies redness, edema, or warmth to abrasions. Treatment has included Neosporin ointment topically. States dog was up-to-date on vaccines. [name] is currently taking an extended course of Doxycycline prescribed by her PCP for chronic sinus infection. She has requested Tdap vaccine.  ? ?[name] reports she has had cracks and redness to bilateral corners of her mouth for three weeks. Treatment has included Hydrocortisone cream and medicated Blistex without relief. ? ? ?Past Medical History:  ?Diagnosis Date  ? Allergic rhinitis   ? Arthritis   ? Blood transfusion without reported diagnosis   ? COPD (chronic obstructive pulmonary disease) ([place])   ? GERD (gastroesophageal reflux disease)   ? History of colon polyps   ? HTN (hypertension)   ? Hyperlipidemia   ? Kidney stones   ? Pancreatitis   ? Pneumonia   ? Primary hypertension 10/20/2021  ? Psychotic depression ([place])   ? Shingles February 26th, 2001  ? ? ?Past Surgical History:  ?Procedure Laterality Date  ? ABDOMINAL HYSTERECTOMY    ? CESAREAN SECTION    ? COLONOSCOPY  05/02/2017  ? Colonic polyp status post polypectomy. Small internal hemorrhoids  ? GALLBLADDER SURGERY    ? REPLACEMENT TOTAL KNEE Right 02/09/2019  ? TOTAL KNEE ARTHROPLASTY  02/09/2019  ? TUBAL LIGATION    ? ? ?Family History  ?Problem Relation Age of Onset  ? COPD Mother   ? Parkinson's disease Mother   ? COPD Father   ? Arthritis Father   ? Lung disease Father   ? Colon cancer Cousin   ?     mother's cousin  ? Breast cancer Maternal Aunt   ?     great aunt  ? Diabetes Other   ? Stroke Other   ? Hypertension Other   ? Hyperlipidemia Other   ? Asthma Other   ? Heart  failure Other   ? Thyroid disease Other   ? Heart attack Other   ? COPD Other   ? Arrhythmia Other   ? Arthritis Other   ? Migraines Other   ? ? ?Social History  ? ?Socioeconomic History  ? Marital status: Widowed  ?  Spouse name: Not on file  ? Number of children: 2  ? Years of education: Not on file  ? Highest education level: High school graduate  ?Occupational History  ? Not on file  ?Tobacco Use  ? Smoking status: Every Day  ?  Packs/day: 1.00  ?  Types: Cigarettes  ? Smokeless tobacco: Never  ? Tobacco comments:  ?  smokes a pack and a half daily 11.25.19  ?Vaping Use  ? Vaping Use: Never used  ?Substance and Sexual Activity  ? Alcohol use: Yes  ? Drug use: No  ? Sexual activity: Not on file  ?Other Topics Concern  ? Not on file  ?Social History Narrative  ? Not on file  ? ?Social Determinants of Health  ? ?Financial Resource Strain: Not on file  ?Food Insecurity: Not on file  ?Transportation Needs: Not on file  ?Physical Activity: Not on file  ?Stress: Not on file  ?Social Connections: Not on file  ?Intimate  Partner Violence: Not on file  ? ? ?Outpatient Medications Prior to Visit  ?Medication Sig Dispense Refill  ? albuterol (PROVENTIL) (2.5 MG/3ML) 0.083% nebulizer solution as needed.   6  ? albuterol (VENTOLIN HFA) 108 (90 Base) MCG/ACT inhaler INHALE 2 PUFFS INTO THE LUNGS EVERY 6 HOURS AS NEEDED FOR WHEEZING OR SHORTNESS OF BREATH 6.7 g 3  ? ALPRAZolam (XANAX) 0.25 MG tablet TAKE 1 TABLET(0.25 MG) BY MOUTH DAILY AS NEEDED FOR ANXIETY 30 tablet 2  ? amLODipine (NORVASC) 10 MG tablet TAKE 1 TABLET(10 MG) BY MOUTH DAILY Strength: 10 mg 90 tablet 1  ? atorvastatin (LIPITOR) 80 MG tablet Take 1 tablet (80 mg total) by mouth daily. 90 tablet 3  ? Budeson-Glycopyrrol-Formoterol (BREZTRI AEROSPHERE) 160-9-4.8 MCG/ACT AERO Inhale 2 puffs into the lungs in the morning and at bedtime. 5.9 g 0  ? cyclobenzaprine (FLEXERIL) 5 MG tablet TAKE 1 TABLET(5 MG) BY MOUTH THREE TIMES DAILY AS NEEDED FOR MUSCLE SPASMS 90  tablet 0  ? dicyclomine (BENTYL) 10 MG capsule TAKE 1 CAPSULE(10 MG) BY MOUTH FOUR TIMES DAILY BEFORE MEALS AND AT BEDTIME 120 capsule 2  ? doxycycline (VIBRA-TABS) 100 MG tablet Take 1 tablet (100 mg total) by mouth 2 (two) times daily. 60 tablet 0  ? fluticasone (FLONASE) 50 MCG/ACT nasal spray Place 2 sprays into both nostrils daily. 16 g 6  ? furosemide (LASIX) 20 MG tablet TAKE 1 TABLET(20 MG) BY MOUTH DAILY 30 tablet 3  ? gabapentin (NEURONTIN) 300 MG capsule TAKE 1 CAPSULE(300 MG) BY MOUTH THREE TIMES DAILY 90 capsule 0  ? metFORMIN (GLUCOPHAGE) 500 MG tablet Take 1 tablet (500 mg total) by mouth daily with breakfast. 90 tablet 0  ? montelukast (SINGULAIR) 10 MG tablet TAKE 1 TABLET(10 MG) BY MOUTH DAILY 90 tablet 0  ? Multiple Vitamins-Minerals (50+ ADULT EYE HEALTH PO) Take 1 tablet by mouth daily.    ? nitroGLYCERIN (NITROSTAT) 0.4 MG SL tablet DISSOLVE 1 TABLET BY MOUTH UNDER THE TONGUE AS NEEDED FOR CHEST PAIN EVERY 5 MINUTES UP TO 3 TIMES 25 tablet 0  ? pantoprazole (PROTONIX) 40 MG tablet TAKE 1 TABLET(40 MG) BY MOUTH DAILY 90 tablet 1  ? Semaglutide,0.25 or 0.5MG /DOS, (OZEMPIC, 0.25 OR 0.5 MG/DOSE,) 2 MG/1.5ML SOPN Inject 0.25 mg into the skin once a week. 1.5 mL 0  ? vitamin E 180 MG (400 UNITS) capsule Take by mouth.    ? ?No facility-administered medications prior to visit.  ? ? ?Allergies  ?Allergen Reactions  ? Levofloxacin Other (See Comments) and Rash  ?  Rash all over redness ?  ? Bactrim Ds [Sulfamethoxazole-Trimethoprim] Nausea Only  ? Levaquin [Levofloxacin In D5w]   ? Tape Hives  ? Codeine Nausea Only  ? Other Rash  ?  bandaids leave a red area if left on too long  ? ? ?Review of Systems  ?Constitutional:  Negative for chills, fatigue and fever.  ?HENT:  Negative for congestion, ear pain, rhinorrhea and sore throat.   ?Respiratory:  Negative for cough and shortness of breath.   ?Cardiovascular:  Negative for chest pain.  ?Skin:  Positive for wound.  ? ?   ?Objective:  ?  ?Physical  Exam ?Vitals reviewed.  ?Skin: ?   Capillary Refill: Capillary refill takes less than 2 seconds.  ?   Findings: Wound present.  ? ?    ?   Comments: Right forearm scratch-like abrasions, approximately 3 cm in length, noted to right forearm. Edges well approximated, slight erythema noted surrounding  abrasion. No warmth, odor, or drainage present  ?Neurological:  ?   General: No focal deficit present.  ?   Mental Status: She is alert and oriented to person, place, and time.  ?Psychiatric:     ?   Mood and Affect: Mood normal.     ?   Behavior: Behavior normal.  ? ? ?LMP  (LMP Unknown) Comment: hysterectomy 2008 ?Wt Readings from Last 3 Encounters:  ?11/13/21 191 lb 12.8 oz (87 kg)  ?10/20/21 194 lb (88 kg)  ?10/05/21 194 lb (88 kg)  ? ? ?Health Maintenance Due  ?Topic Date Due  ? TETANUS/TDAP  Never done  ? PAP SMEAR-Modifier  04/05/2017  ? ? ? ? ? ?Lab Results  ?Component Value Date  ? TSH 3.120 10/20/2021  ? ?Lab Results  ?Component Value Date  ? WBC 6.4 10/20/2021  ? HGB 15.2 10/20/2021  ? HCT 44.5 10/20/2021  ? MCV 94 10/20/2021  ? PLT 274 10/20/2021  ? ?Lab Results  ?Component Value Date  ? NA 140 10/20/2021  ? K 4.1 10/20/2021  ? CO2 22 10/20/2021  ? GLUCOSE 109 (H) 10/20/2021  ? BUN 13 10/20/2021  ? CREATININE 0.57 10/20/2021  ? BILITOT 0.3 10/20/2021  ? ALKPHOS 83 10/20/2021  ? AST 18 10/20/2021  ? ALT 36 (H) 10/20/2021  ? PROT 6.6 10/20/2021  ? ALBUMIN 4.5 10/20/2021  ? CALCIUM 9.8 10/20/2021  ? EGFR 107 10/20/2021  ? ?Lab Results  ?Component Value Date  ? CHOL 204 (H) 10/20/2021  ? ?Lab Results  ?Component Value Date  ? HDL 49 10/20/2021  ? ?Lab Results  ?Component Value Date  ? LDLCALC 129 (H) 10/20/2021  ? ?Lab Results  ?Component Value Date  ? TRIG 143 10/20/2021  ? ?Lab Results  ?Component Value Date  ? CHOLHDL 4.2 10/20/2021  ? ?Lab Results  ?Component Value Date  ? HGBA1C 5.7 (H) 10/20/2021  ? ? ?   ?Assessment & Plan:  ? ? ?1. Dog scratch ?- mupirocin cream (BACTROBAN) 2 %; Apply 1 application.  topically 2 (two) times daily. Apply to right arm abrasion twice daily  Dispense: 15 g; Refill: 0 ? ?2. Abrasion of right forearm, initial encounter ?- mupirocin cream (BACTROBAN) 2 %; Apply 1 application. topically 2 (two)

## 2021-12-04 ENCOUNTER — Other Ambulatory Visit: Payer: Self-pay | Admitting: Nurse Practitioner

## 2021-12-04 DIAGNOSIS — G629 Polyneuropathy, unspecified: Secondary | ICD-10-CM

## 2021-12-16 ENCOUNTER — Other Ambulatory Visit: Payer: Self-pay | Admitting: Family Medicine

## 2021-12-17 ENCOUNTER — Encounter: Payer: Self-pay | Admitting: Family Medicine

## 2021-12-17 ENCOUNTER — Ambulatory Visit (INDEPENDENT_AMBULATORY_CARE_PROVIDER_SITE_OTHER): Payer: 59 | Admitting: Family Medicine

## 2021-12-17 VITALS — BP 128/72 | HR 84 | Temp 97.3°F | Resp 16 | Ht 67.0 in | Wt 187.0 lb

## 2021-12-17 DIAGNOSIS — E663 Overweight: Secondary | ICD-10-CM | POA: Diagnosis not present

## 2021-12-17 DIAGNOSIS — Z1231 Encounter for screening mammogram for malignant neoplasm of breast: Secondary | ICD-10-CM

## 2021-12-17 DIAGNOSIS — Z122 Encounter for screening for malignant neoplasm of respiratory organs: Secondary | ICD-10-CM

## 2021-12-17 DIAGNOSIS — R7303 Prediabetes: Secondary | ICD-10-CM | POA: Diagnosis not present

## 2021-12-17 DIAGNOSIS — J410 Simple chronic bronchitis: Secondary | ICD-10-CM

## 2021-12-17 DIAGNOSIS — Z6829 Body mass index (BMI) 29.0-29.9, adult: Secondary | ICD-10-CM

## 2021-12-17 MED ORDER — TRELEGY ELLIPTA 200-62.5-25 MCG/ACT IN AEPB: 1.0000 | INHALATION_SPRAY | Freq: Every day | RESPIRATORY_TRACT | 2 refills | Status: DC

## 2021-12-17 MED ORDER — OZEMPIC (0.25 OR 0.5 MG/DOSE) 2 MG/1.5ML ~~LOC~~ SOPN: 0.5000 mg | PEN_INJECTOR | SUBCUTANEOUS | 0 refills | Status: DC

## 2021-12-17 NOTE — Patient Instructions (Signed)
Increase ozempic to 0.5 mg weekly.  ?(Check if your insurance covers [facility] in case they stop paying for ozempic.) ? ?Complete breztri, then switch to trelegy on inhalation daily.  ?

## 2021-12-17 NOTE — Progress Notes (Signed)
? ?Subjective:  ?Patient ID: [name], female    DOB: July 29, 1966  Age: 56 y.o. MRN: [id number] ? ?Chief Complaint  ?Patient presents with  ? Obesity  ? Cough  ? ? ?HPI ?COPD: started on breztri 2 puffs twice daily. Albuterol 2 puffs four times a day as needed.  ?Weight management: On ozempic 0.25 mg weekly. Helps to suppress appetite. Occasional nausea, but tolerable. ?Eating healthy.  ? ? ? ?Current Outpatient Medications on File Prior to Visit  ?Medication Sig Dispense Refill  ? albuterol (PROVENTIL) (2.5 MG/3ML) 0.083% nebulizer solution as needed.   6  ? albuterol (VENTOLIN HFA) 108 (90 Base) MCG/ACT inhaler INHALE 2 PUFFS INTO THE LUNGS EVERY 6 HOURS AS NEEDED FOR WHEEZING OR SHORTNESS OF BREATH 6.7 g 3  ? ALPRAZolam (XANAX) 0.25 MG tablet TAKE 1 TABLET(0.25 MG) BY MOUTH DAILY AS NEEDED FOR ANXIETY 30 tablet 2  ? amLODipine (NORVASC) 10 MG tablet TAKE 1 TABLET(10 MG) BY MOUTH DAILY Strength: 10 mg 90 tablet 1  ? atorvastatin (LIPITOR) 80 MG tablet Take 1 tablet (80 mg total) by mouth daily. 90 tablet 3  ? cyclobenzaprine (FLEXERIL) 5 MG tablet TAKE 1 TABLET(5 MG) BY MOUTH THREE TIMES DAILY AS NEEDED FOR MUSCLE SPASMS 90 tablet 0  ? dicyclomine (BENTYL) 10 MG capsule TAKE 1 CAPSULE(10 MG) BY MOUTH FOUR TIMES DAILY BEFORE MEALS AND AT BEDTIME 120 capsule 2  ? fluticasone (FLONASE) 50 MCG/ACT nasal spray Place 2 sprays into both nostrils daily. 16 g 6  ? furosemide (LASIX) 20 MG tablet TAKE 1 TABLET(20 MG) BY MOUTH DAILY 30 tablet 3  ? gabapentin (NEURONTIN) 300 MG capsule TAKE 1 CAPSULE(300 MG) BY MOUTH THREE TIMES DAILY 90 capsule 0  ? metFORMIN (GLUCOPHAGE) 500 MG tablet Take 1 tablet (500 mg total) by mouth daily with breakfast. 90 tablet 0  ? montelukast (SINGULAIR) 10 MG tablet TAKE 1 TABLET(10 MG) BY MOUTH DAILY 90 tablet 0  ? Multiple Vitamins-Minerals (50+ ADULT EYE HEALTH PO) Take 1 tablet by mouth daily.    ? mupirocin cream (BACTROBAN) 2 % Apply 1 application. topically 2 (two) times daily.  Apply to right arm abrasion twice daily 15 g 0  ? nitroGLYCERIN (NITROSTAT) 0.4 MG SL tablet DISSOLVE 1 TABLET BY MOUTH UNDER THE TONGUE AS NEEDED FOR CHEST PAIN EVERY 5 MINUTES UP TO 3 TIMES 25 tablet 0  ? pantoprazole (PROTONIX) 40 MG tablet TAKE 1 TABLET(40 MG) BY MOUTH DAILY 90 tablet 1  ? triamcinolone (KENALOG) 0.025 % ointment Apply 1 application. topically 2 (two) times daily. Apply to corners of mouth twice daily 30 g 0  ? vitamin E 180 MG (400 UNITS) capsule Take by mouth.    ? ?No current facility-administered medications on file prior to visit.  ? ?Past Medical History:  ?Diagnosis Date  ? Allergic rhinitis   ? Arthritis   ? Blood transfusion without reported diagnosis   ? COPD (chronic obstructive pulmonary disease) ([place])   ? GERD (gastroesophageal reflux disease)   ? History of colon polyps   ? HTN (hypertension)   ? Hyperlipidemia   ? Kidney stones   ? Pancreatitis   ? Pneumonia   ? Primary hypertension 10/20/2021  ? Psychotic depression ([place])   ? Shingles February 26th, 2001  ? ?Past Surgical History:  ?Procedure Laterality Date  ? ABDOMINAL HYSTERECTOMY    ? CESAREAN SECTION    ? COLONOSCOPY  05/02/2017  ? Colonic polyp status post polypectomy. Small internal hemorrhoids  ? GALLBLADDER  SURGERY    ? REPLACEMENT TOTAL KNEE Right 02/09/2019  ? TOTAL KNEE ARTHROPLASTY  02/09/2019  ? TUBAL LIGATION    ?  ?Family History  ?Problem Relation Age of Onset  ? COPD Mother   ? Parkinson's disease Mother   ? COPD Father   ? Arthritis Father   ? Lung disease Father   ? Colon cancer Cousin   ?     mother's cousin  ? Breast cancer Maternal Aunt   ?     great aunt  ? Diabetes Other   ? Stroke Other   ? Hypertension Other   ? Hyperlipidemia Other   ? Asthma Other   ? Heart failure Other   ? Thyroid disease Other   ? Heart attack Other   ? COPD Other   ? Arrhythmia Other   ? Arthritis Other   ? Migraines Other   ? ?Social History  ? ?Socioeconomic History  ? Marital status: Widowed  ?  Spouse name: Not on file  ?  Number of children: 2  ? Years of education: Not on file  ? Highest education level: High school graduate  ?Occupational History  ? Not on file  ?Tobacco Use  ? Smoking status: Every Day  ?  Packs/day: 1.00  ?  Years: 20.00  ?  Pack years: 20.00  ?  Types: Cigarettes  ? Smokeless tobacco: Never  ?Vaping Use  ? Vaping Use: Never used  ?Substance and Sexual Activity  ? Alcohol use: Yes  ? Drug use: No  ? Sexual activity: Not on file  ?Other Topics Concern  ? Not on file  ?Social History Narrative  ? Not on file  ? ?Social Determinants of Health  ? ?Financial Resource Strain: Not on file  ?Food Insecurity: Not on file  ?Transportation Needs: Not on file  ?Physical Activity: Not on file  ?Stress: Not on file  ?Social Connections: Not on file  ? ? ?Review of Systems  ?Constitutional:  Negative for chills, fatigue and fever.  ?HENT:  Positive for congestion. Negative for rhinorrhea and sore throat.   ?Respiratory:  Positive for cough. Negative for shortness of breath.   ?Cardiovascular:  Negative for chest pain.  ?Gastrointestinal:  Negative for abdominal pain, constipation, diarrhea, nausea and vomiting.  ?Genitourinary:  Negative for dysuria and urgency.  ?Musculoskeletal:  Negative for back pain and myalgias.  ?Neurological:  Positive for headaches. Negative for dizziness, weakness and light-headedness.  ?Psychiatric/Behavioral:  Negative for dysphoric mood. The patient is not nervous/anxious.   ? ? ?Objective:  ?BP 128/72   Pulse 84   Temp (!) 97.3 ?F (36.3 ?C)   Resp 16   Ht '5\' 7"'$  (1.702 m)   Wt 187 lb (84.8 kg)   LMP  (LMP Unknown) Comment: hysterectomy 2008  BMI 29.29 kg/m?  ? ? ?  12/17/2021  ?  3:15 PM 12/01/2021  ? 10:08 AM 11/13/2021  ?  9:40 AM  ?BP/Weight  ?Systolic BP 235 573 220  ?Diastolic BP 72 68 78  ?Wt. (Lbs) 187 190 191.8  ?BMI 29.29 kg/m2 30.67 kg/m2 30.04 kg/m2  ? ? ?Physical Exam ?Vitals reviewed.  ?Constitutional:   ?   Appearance: Normal appearance.  ?HENT:  ?   Right Ear: Tympanic membrane,  ear canal and external ear normal.  ?   Left Ear: Tympanic membrane, ear canal and external ear normal.  ?   Nose: Nose normal.  ?   Mouth/Throat:  ?   Pharynx: Oropharynx is  clear.  ?Cardiovascular:  ?   Rate and Rhythm: Normal rate and regular rhythm.  ?   Heart sounds: Normal heart sounds. No murmur heard. ?Pulmonary:  ?   Effort: Pulmonary effort is normal. No respiratory distress.  ?   Breath sounds: Normal breath sounds.  ?Lymphadenopathy:  ?   Cervical: No cervical adenopathy.  ?Neurological:  ?   Mental Status: She is alert and oriented to person, place, and time.  ?Psychiatric:     ?   Mood and Affect: Mood normal.     ?   Behavior: Behavior normal.  ? ? ?Diabetic Foot Exam - Simple   ?No data filed ?  ?  ? ?Lab Results  ?Component Value Date  ? WBC 6.4 10/20/2021  ? HGB 15.2 10/20/2021  ? HCT 44.5 10/20/2021  ? PLT 274 10/20/2021  ? GLUCOSE 109 (H) 10/20/2021  ? CHOL 204 (H) 10/20/2021  ? TRIG 143 10/20/2021  ? HDL 49 10/20/2021  ? LDLCALC 129 (H) 10/20/2021  ? ALT 36 (H) 10/20/2021  ? AST 18 10/20/2021  ? NA 140 10/20/2021  ? K 4.1 10/20/2021  ? CL 101 10/20/2021  ? CREATININE 0.57 10/20/2021  ? BUN 13 10/20/2021  ? CO2 22 10/20/2021  ? TSH 3.120 10/20/2021  ? INR 0.9 01/01/2020  ? HGBA1C 5.7 (H) 10/20/2021  ? ? ? ? ?Assessment & Plan:  ? ?Problem List Items Addressed This Visit   ? ?  ? Respiratory  ? COPD (chronic obstructive pulmonary disease) ([place])  ?  Change from breztri to trelegy. Cost is less. One month free and then discount card given.  ?  ?  ? Relevant Medications  ? Fluticasone-Umeclidin-Vilant (TRELEGY ELLIPTA) 200-62.5-25 MCG/ACT AEPB  ?  ? Other  ? Screening mammogram, encounter for  ?  Check mammogram.  ?  ?  ? Relevant Orders  ? MM 3D SCREEN BREAST BILATERAL (Completed)  ? Overweight with body mass index (BMI) of 29 to 29.9 in adult  ?  Increase ozempic 0.5 mg once weekly.  ?Recommend continue to work on eating healthy diet and exercise. ? ?  ?  ? Screening for lung cancer  ?  Order ct of  chest.  ?  ?  ? Relevant Orders  ? CT CHEST LUNG CANCER SCREENING LOW DOSE WO CONTRAST (Completed)  ? Prediabetes - Primary  ?  Increase ozempic to 0.5 mg weekly.  ?(Check if your insurance covers [facility] in case the

## 2021-12-21 ENCOUNTER — Ambulatory Visit (HOSPITAL_BASED_OUTPATIENT_CLINIC_OR_DEPARTMENT_OTHER)
Admission: RE | Admit: 2021-12-21 | Discharge: 2021-12-21 | Disposition: A | Discharge status: Home / Self Care | Payer: 59 | Source: Ambulatory Visit | Attending: Family Medicine | Admitting: Family Medicine

## 2021-12-21 ENCOUNTER — Encounter (HOSPITAL_BASED_OUTPATIENT_CLINIC_OR_DEPARTMENT_OTHER): Payer: Self-pay

## 2021-12-21 DIAGNOSIS — Z1231 Encounter for screening mammogram for malignant neoplasm of breast: Secondary | ICD-10-CM | POA: Insufficient documentation

## 2021-12-22 ENCOUNTER — Encounter: Payer: Self-pay | Admitting: Family Medicine

## 2021-12-23 DIAGNOSIS — Z1231 Encounter for screening mammogram for malignant neoplasm of breast: Secondary | ICD-10-CM | POA: Insufficient documentation

## 2021-12-23 NOTE — Assessment & Plan Note (Signed)
Increase ozempic to 0.5 mg weekly.  ?(Check if your insurance covers [facility] in case they stop paying for ozempic.) ?

## 2021-12-30 ENCOUNTER — Ambulatory Visit (HOSPITAL_BASED_OUTPATIENT_CLINIC_OR_DEPARTMENT_OTHER)
Admission: RE | Admit: 2021-12-30 | Discharge: 2021-12-30 | Disposition: A | Discharge status: Home / Self Care | Payer: 59 | Source: Ambulatory Visit | Attending: Family Medicine | Admitting: Family Medicine

## 2021-12-30 DIAGNOSIS — Z122 Encounter for screening for malignant neoplasm of respiratory organs: Secondary | ICD-10-CM | POA: Diagnosis present

## 2021-12-31 ENCOUNTER — Encounter: Payer: Self-pay | Admitting: Family Medicine

## 2021-12-31 DIAGNOSIS — Z122 Encounter for screening for malignant neoplasm of respiratory organs: Secondary | ICD-10-CM | POA: Insufficient documentation

## 2021-12-31 DIAGNOSIS — Z6828 Body mass index (BMI) 28.0-28.9, adult: Secondary | ICD-10-CM | POA: Insufficient documentation

## 2021-12-31 DIAGNOSIS — E663 Overweight: Secondary | ICD-10-CM | POA: Insufficient documentation

## 2021-12-31 NOTE — Assessment & Plan Note (Signed)
Check mammogram 

## 2021-12-31 NOTE — Assessment & Plan Note (Addendum)
Change from breztri to trelegy. Cost is less. One month free and then discount card given.  ?

## 2021-12-31 NOTE — Assessment & Plan Note (Signed)
Increase ozempic 0.5 mg once weekly.  ?Recommend continue to work on eating healthy diet and exercise. ? ?

## 2021-12-31 NOTE — Assessment & Plan Note (Signed)
Order ct of chest.  ?

## 2022-01-10 ENCOUNTER — Other Ambulatory Visit: Payer: Self-pay | Admitting: Nurse Practitioner

## 2022-01-10 DIAGNOSIS — G629 Polyneuropathy, unspecified: Secondary | ICD-10-CM

## 2022-01-16 ENCOUNTER — Other Ambulatory Visit: Payer: Self-pay | Admitting: Family Medicine

## 2022-01-19 ENCOUNTER — Other Ambulatory Visit: Payer: Self-pay | Admitting: Family Medicine

## 2022-01-21 NOTE — Progress Notes (Signed)
? ?Subjective:  ?Patient ID: [name], female    DOB: 29-Mar-1966  Age: 57 y.o. MRN: [id number] ? ?Chief Complaint  ?Patient presents with  ? Hypertension  ? Hyperlipidemia  ? Prediabetes  ? ?HPI: ?Hyperlipidemia:  She takes atorvastatin 80 mg daily. Added coenzyme q 10 which helped with muscle cramps. ?Hypertension: On amlodipine 10 mg daily, furosemide 20 mg. Takes furosemide 20 mg three times a week for swelling. Would like to do every day, but does not drink enough water and is concerned about dehydration. ? ?GERD: Protonix 40 mg daily. Not working well. Has breakthrough reflux. Takes otc omeprazole or pepcide at times and "eating TUMS." ?Prediabetes; a1c 5.7. Eating healthy. Not drinking enough water. Not exercising. On ozempic 0.5 mg once weekly.  ? ?Patient lost job 08/2022. Laid off due to decreased business. Has not been able to get another job despite trying hard. She is applying for disability.  ? ?Chronic back pain: on gabapentin 300 mg before bed as needed. Has had ESIs in the past that have helped. Patient saw orthopedics for right leg pain. Orthopedics (Electrical engineer) recommended she goes back to Dr. Arnoldo Morale, her neurosurgeon.  May need an MRI.  ? ?Asthma: Albuterol hfa 2 puffs four times a day as needed shortness of breath. On trelegy one inhalation daily. On singulair.  ? ?Tobacco use: Has patches. Wants to quit, but under a lot of stress. Unemployed and lost her unemployment.  ?  ?Current Outpatient Medications on File Prior to Visit  ?Medication Sig Dispense Refill  ? albuterol (PROVENTIL) (2.5 MG/3ML) 0.083% nebulizer solution as needed.   6  ? albuterol (VENTOLIN HFA) 108 (90 Base) MCG/ACT inhaler INHALE 2 PUFFS INTO THE LUNGS EVERY 6 HOURS AS NEEDED FOR WHEEZING OR SHORTNESS OF BREATH 6.7 g 3  ? ALPRAZolam (XANAX) 0.25 MG tablet TAKE 1 TABLET(0.25 MG) BY MOUTH DAILY AS NEEDED FOR ANXIETY 30 tablet 2  ? amLODipine (NORVASC) 10 MG tablet TAKE 1 TABLET(10 MG) BY MOUTH DAILY Strength: 10 mg 90  tablet 1  ? atorvastatin (LIPITOR) 80 MG tablet Take 1 tablet (80 mg total) by mouth daily. 90 tablet 3  ? cyclobenzaprine (FLEXERIL) 5 MG tablet TAKE 1 TABLET(5 MG) BY MOUTH THREE TIMES DAILY AS NEEDED FOR MUSCLE SPASMS 90 tablet 0  ? dicyclomine (BENTYL) 10 MG capsule TAKE 1 CAPSULE(10 MG) BY MOUTH FOUR TIMES DAILY BEFORE MEALS AND AT BEDTIME 120 capsule 2  ? fluticasone (FLONASE) 50 MCG/ACT nasal spray Place 2 sprays into both nostrils daily. 16 g 6  ? Fluticasone-Umeclidin-Vilant (TRELEGY ELLIPTA) 200-62.5-25 MCG/ACT AEPB Inhale 1 each into the lungs daily. 28 each 2  ? furosemide (LASIX) 20 MG tablet TAKE 1 TABLET(20 MG) BY MOUTH DAILY 30 tablet 3  ? montelukast (SINGULAIR) 10 MG tablet TAKE 1 TABLET(10 MG) BY MOUTH DAILY 90 tablet 0  ? Multiple Vitamins-Minerals (50+ ADULT EYE HEALTH PO) Take 1 tablet by mouth daily.    ? mupirocin cream (BACTROBAN) 2 % Apply 1 application. topically 2 (two) times daily. Apply to right arm abrasion twice daily 15 g 0  ? nitroGLYCERIN (NITROSTAT) 0.4 MG SL tablet DISSOLVE 1 TABLET BY MOUTH UNDER THE TONGUE AS NEEDED FOR CHEST PAIN EVERY 5 MINUTES UP TO 3 TIMES 25 tablet 0  ? pantoprazole (PROTONIX) 40 MG tablet TAKE 1 TABLET(40 MG) BY MOUTH DAILY 90 tablet 1  ? triamcinolone (KENALOG) 0.025 % ointment Apply 1 application. topically 2 (two) times daily. Apply to corners of mouth twice daily 30  g 0  ? vitamin E 180 MG (400 UNITS) capsule Take by mouth.    ? ?No current facility-administered medications on file prior to visit.  ? ?Past Medical History:  ?Diagnosis Date  ? Allergic rhinitis   ? Arthritis   ? Blood transfusion without reported diagnosis   ? COPD (chronic obstructive pulmonary disease) ([place])   ? GERD (gastroesophageal reflux disease)   ? History of colon polyps   ? HTN (hypertension)   ? Hyperlipidemia   ? Kidney stones   ? Pancreatitis   ? Pneumonia   ? Primary hypertension 10/20/2021  ? Psychotic depression ([place])   ? Shingles February 26th, 2001  ? ?Past Surgical  History:  ?Procedure Laterality Date  ? ABDOMINAL HYSTERECTOMY    ? CESAREAN SECTION    ? COLONOSCOPY  05/02/2017  ? Colonic polyp status post polypectomy. Small internal hemorrhoids  ? GALLBLADDER SURGERY    ? REPLACEMENT TOTAL KNEE Right 02/09/2019  ? TOTAL KNEE ARTHROPLASTY  02/09/2019  ? TUBAL LIGATION    ?  ?Family History  ?Problem Relation Age of Onset  ? COPD Mother   ? Parkinson's disease Mother   ? COPD Father   ? Arthritis Father   ? Lung disease Father   ? Colon cancer Cousin   ?     mother's cousin  ? Breast cancer Maternal Aunt   ?     great aunt  ? Diabetes Other   ? Stroke Other   ? Hypertension Other   ? Hyperlipidemia Other   ? Asthma Other   ? Heart failure Other   ? Thyroid disease Other   ? Heart attack Other   ? COPD Other   ? Arrhythmia Other   ? Arthritis Other   ? Migraines Other   ? ?Social History  ? ?Socioeconomic History  ? Marital status: Widowed  ?  Spouse name: Not on file  ? Number of children: 2  ? Years of education: Not on file  ? Highest education level: High school graduate  ?Occupational History  ? Not on file  ?Tobacco Use  ? Smoking status: Every Day  ?  Packs/day: 1.00  ?  Years: 20.00  ?  Pack years: 20.00  ?  Types: Cigarettes  ? Smokeless tobacco: Never  ?Vaping Use  ? Vaping Use: Never used  ?Substance and Sexual Activity  ? Alcohol use: Yes  ? Drug use: No  ? Sexual activity: Not on file  ?Other Topics Concern  ? Not on file  ?Social History Narrative  ? Not on file  ? ?Social Determinants of Health  ? ?Financial Resource Strain: Not on file  ?Food Insecurity: Not on file  ?Transportation Needs: Not on file  ?Physical Activity: Not on file  ?Stress: Not on file  ?Social Connections: Not on file  ? ? ?Review of Systems  ?Constitutional:  Negative for appetite change, fatigue and fever.  ?HENT:  Positive for congestion. Negative for ear pain, sinus pressure and sore throat.   ?Respiratory:  Positive for cough. Negative for chest tightness, shortness of breath and  wheezing.   ?Cardiovascular:  Negative for chest pain and palpitations.  ?Gastrointestinal:  Positive for constipation (Hard. Takes 4 stool softeners daily and has to use dulcolax every 3 days.). Negative for abdominal pain, diarrhea, nausea and vomiting.  ?Genitourinary:  Negative for dysuria and hematuria.  ?Musculoskeletal:  Positive for back pain. Negative for arthralgias (right knee and left hip pain), joint swelling and myalgias.  ?  Skin:  Negative for rash.  ?Neurological:  Positive for headaches. Negative for dizziness and weakness.  ?Psychiatric/Behavioral:  Negative for dysphoric mood. The patient is not nervous/anxious.   ? ? ?Objective:  ?BP 116/66   Pulse 72   Temp (!) 97 ?F (36.1 ?C)   Resp 16   Ht '5\' 6"'$  (1.676 m)   Wt 183 lb (83 kg)   LMP  (LMP Unknown) Comment: hysterectomy 2008  BMI 29.54 kg/m?  ? ? ?  01/22/2022  ?  7:31 AM 12/17/2021  ?  3:15 PM 12/01/2021  ? 10:08 AM  ?BP/Weight  ?Systolic BP 371 062 694  ?Diastolic BP 66 72 68  ?Wt. (Lbs) 183 187 190  ?BMI 29.54 kg/m2 29.29 kg/m2 30.67 kg/m2  ? ? ?Physical Exam ?Vitals reviewed.  ?Constitutional:   ?   Appearance: Normal appearance. She is normal weight.  ?Neck:  ?   Vascular: No carotid bruit.  ?Cardiovascular:  ?   Rate and Rhythm: Normal rate and regular rhythm.  ?   Heart sounds: Normal heart sounds.  ?Pulmonary:  ?   Effort: Pulmonary effort is normal.  ?   Breath sounds: Normal breath sounds.  ?Abdominal:  ?   General: Abdomen is flat.  ?   Palpations: Abdomen is soft.  ?   Tenderness: There is no abdominal tenderness.  ?Neurological:  ?   Mental Status: She is alert and oriented to person, place, and time.  ?Psychiatric:     ?   Mood and Affect: Mood normal.     ?   Behavior: Behavior normal.  ? ? ?Diabetic Foot Exam - Simple   ?No data filed ?  ?  ? ?Lab Results  ?Component Value Date  ? WBC 6.4 10/20/2021  ? HGB 15.2 10/20/2021  ? HCT 44.5 10/20/2021  ? PLT 274 10/20/2021  ? GLUCOSE 109 (H) 10/20/2021  ? CHOL 204 (H) 10/20/2021  ?  TRIG 143 10/20/2021  ? HDL 49 10/20/2021  ? LDLCALC 129 (H) 10/20/2021  ? ALT 36 (H) 10/20/2021  ? AST 18 10/20/2021  ? NA 140 10/20/2021  ? K 4.1 10/20/2021  ? CL 101 10/20/2021  ? CREATININE 0.57 10/20/2021  ? BUN 13

## 2022-01-22 ENCOUNTER — Ambulatory Visit (INDEPENDENT_AMBULATORY_CARE_PROVIDER_SITE_OTHER): Payer: 59 | Admitting: Family Medicine

## 2022-01-22 ENCOUNTER — Encounter: Payer: Self-pay | Admitting: Family Medicine

## 2022-01-22 VITALS — BP 116/66 | HR 72 | Temp 97.0°F | Resp 16 | Ht 66.0 in | Wt 183.0 lb

## 2022-01-22 DIAGNOSIS — G43809 Other migraine, not intractable, without status migrainosus: Secondary | ICD-10-CM

## 2022-01-22 DIAGNOSIS — E6609 Other obesity due to excess calories: Secondary | ICD-10-CM

## 2022-01-22 DIAGNOSIS — E782 Mixed hyperlipidemia: Secondary | ICD-10-CM

## 2022-01-22 DIAGNOSIS — M791 Myalgia, unspecified site: Secondary | ICD-10-CM

## 2022-01-22 DIAGNOSIS — K219 Gastro-esophageal reflux disease without esophagitis: Secondary | ICD-10-CM

## 2022-01-22 DIAGNOSIS — F17219 Nicotine dependence, cigarettes, with unspecified nicotine-induced disorders: Secondary | ICD-10-CM

## 2022-01-22 DIAGNOSIS — J4540 Moderate persistent asthma, uncomplicated: Secondary | ICD-10-CM

## 2022-01-22 DIAGNOSIS — R7303 Prediabetes: Secondary | ICD-10-CM

## 2022-01-22 DIAGNOSIS — T466X5A Adverse effect of antihyperlipidemic and antiarteriosclerotic drugs, initial encounter: Secondary | ICD-10-CM | POA: Insufficient documentation

## 2022-01-22 DIAGNOSIS — F411 Generalized anxiety disorder: Secondary | ICD-10-CM

## 2022-01-22 DIAGNOSIS — I10 Essential (primary) hypertension: Secondary | ICD-10-CM

## 2022-01-22 DIAGNOSIS — G8929 Other chronic pain: Secondary | ICD-10-CM

## 2022-01-22 DIAGNOSIS — M545 Low back pain, unspecified: Secondary | ICD-10-CM

## 2022-01-22 DIAGNOSIS — G629 Polyneuropathy, unspecified: Secondary | ICD-10-CM

## 2022-01-22 DIAGNOSIS — Z6830 Body mass index (BMI) 30.0-30.9, adult: Secondary | ICD-10-CM

## 2022-01-22 MED ORDER — OZEMPIC (1 MG/DOSE) 4 MG/3ML ~~LOC~~ SOPN: 1.0000 mg | PEN_INJECTOR | SUBCUTANEOUS | 0 refills | Status: DC

## 2022-01-22 MED ORDER — LINACLOTIDE 145 MCG PO CAPS
145.0000 ug | ORAL_CAPSULE | Freq: Every day | ORAL | 0 refills | Status: DC
Start: 2022-01-22 — End: 2022-05-06

## 2022-01-22 MED ORDER — FAMOTIDINE 40 MG PO TABS: 40.0000 mg | ORAL_TABLET | Freq: Every day | ORAL | 1 refills | Status: DC

## 2022-01-22 MED ORDER — GABAPENTIN 300 MG PO CAPS: 300.0000 mg | ORAL_CAPSULE | Freq: Every evening | ORAL | 0 refills | Status: DC | PRN

## 2022-01-22 NOTE — Assessment & Plan Note (Addendum)
The current medical regimen is effective;  continue present plan and medications. ?Continue trelegy one inhalation daily. Continue albuterol hfa 2 puff four times a day as needed shortness of breath. Continue singulair 10 mg daily.  ?Recommend smoking.  ? ?

## 2022-01-22 NOTE — Assessment & Plan Note (Addendum)
The current medical regimen is effective;  continue present plan and medications. Continue amlodipine 10 mg daily.  ?Recommend continue to work on eating healthy diet and exercise. ?Check labs.  ?

## 2022-01-22 NOTE — Assessment & Plan Note (Signed)
The current medical regimen is effective;  continue present plan and medications.  ?Hold lipitor to see if muscle pain resolves. ?

## 2022-01-22 NOTE — Patient Instructions (Addendum)
Increase ozempic to 1 mg once a week.  ?Stat on pepcid 40 mg daily prescription. If they do not cover this you could try otc pepcid 20 or 40 mg daily.  ?Start on Linzess 145 mg once daily in am on empty stomach. Hold stool softeners.  ?Recommend continue to work on eating healthy diet and exercise. ? ?

## 2022-01-22 NOTE — Assessment & Plan Note (Signed)
Recommend cessation. ?

## 2022-01-22 NOTE — Assessment & Plan Note (Signed)
Recommend continue to work on eating healthy diet and exercise. ?Increase ozempic 1 mg weekly. ?

## 2022-01-22 NOTE — Assessment & Plan Note (Signed)
>>  ASSESSMENT AND PLAN FOR MODERATE PERSISTENT ASTHMA WITHOUT COMPLICATION/COPD WRITTEN ON 01/22/2022  5:15 PM BY COX, KIRSTEN, MD  The current medical regimen is effective;  continue present plan and medications. Continue trelegy one inhalation daily. Continue albuterol hfa 2 puff four times a day as needed shortness of breath. Continue singulair 10 mg daily.  Recommend smoking.

## 2022-01-22 NOTE — Assessment & Plan Note (Signed)
Xanax. Takes sparingly. ?

## 2022-01-22 NOTE — Assessment & Plan Note (Signed)
Follow up with Dr. Arnoldo Morale. Needs mri.  ?

## 2022-01-22 NOTE — Assessment & Plan Note (Signed)
HOLD LIPITOR. Discussed that I would like her to try crestor if her muscle pain resolves with discontinuation of lipitor.  ?

## 2022-01-22 NOTE — Assessment & Plan Note (Signed)
Start pepcid 40 mg daily.  ?Continue protonix 40 mg daily. ?

## 2022-01-22 NOTE — Assessment & Plan Note (Signed)
Gabapentin 300 mg once before bed.  ?

## 2022-01-22 NOTE — Assessment & Plan Note (Signed)
Recommend continue to work on eating healthy diet and exercise.  

## 2022-01-23 LAB — CBC WITH DIFF/PLATELET
Basophils Absolute: 0.1 10*3/uL (ref 0.0–0.2)
Basos: 1 %
EOS (ABSOLUTE): 0.2 10*3/uL (ref 0.0–0.4)
Eos: 3 %
Hematocrit: 45 % (ref 34.0–46.6)
Hemoglobin: 15.3 g/dL (ref 11.1–15.9)
Immature Grans (Abs): 0 10*3/uL (ref 0.0–0.1)
Immature Granulocytes: 0 %
Lymphocytes Absolute: 1.9 10*3/uL (ref 0.7–3.1)
Lymphs: 24 %
MCH: 32.7 pg (ref 26.6–33.0)
MCHC: 34 g/dL (ref 31.5–35.7)
MCV: 96 fL (ref 79–97)
Monocytes Absolute: 0.6 10*3/uL (ref 0.1–0.9)
Monocytes: 8 %
Neutrophils Absolute: 5.1 10*3/uL (ref 1.4–7.0)
Neutrophils: 64 %
Platelets: 288 10*3/uL (ref 150–450)
RBC: 4.68 x10E6/uL (ref 3.77–5.28)
RDW: 12.9 % (ref 11.7–15.4)
WBC: 7.9 10*3/uL (ref 3.4–10.8)

## 2022-01-23 LAB — COMPREHENSIVE METABOLIC PANEL
ALT: 27 IU/L (ref 0–32)
AST: 20 IU/L (ref 0–40)
Albumin/Globulin Ratio: 2.2 (ref 1.2–2.2)
Albumin: 4.7 g/dL (ref 3.8–4.9)
Alkaline Phosphatase: 89 IU/L (ref 44–121)
BUN/Creatinine Ratio: 22 (ref 9–23)
BUN: 13 mg/dL (ref 6–24)
Bilirubin Total: 0.3 mg/dL (ref 0.0–1.2)
CO2: 26 mmol/L (ref 20–29)
Calcium: 9.4 mg/dL (ref 8.7–10.2)
Chloride: 102 mmol/L (ref 96–106)
Creatinine, Ser: 0.58 mg/dL (ref 0.57–1.00)
Globulin, Total: 2.1 g/dL (ref 1.5–4.5)
Glucose: 96 mg/dL (ref 70–99)
Potassium: 4.7 mmol/L (ref 3.5–5.2)
Sodium: 140 mmol/L (ref 134–144)
Total Protein: 6.8 g/dL (ref 6.0–8.5)
eGFR: 106 mL/min/{1.73_m2} (ref >59)

## 2022-01-23 LAB — LIPID PANEL
Chol/HDL Ratio: 2.7 ratio (ref 0.0–4.4)
Cholesterol, Total: 164 mg/dL (ref 100–199)
HDL: 60 mg/dL (ref >39)
LDL Chol Calc (NIH): 83 mg/dL (ref 0–99)
Triglycerides: 116 mg/dL (ref 0–149)
VLDL Cholesterol Cal: 21 mg/dL (ref 5–40)

## 2022-01-23 LAB — HEMOGLOBIN A1C
Est. average glucose Bld gHb Est-mCnc: 105 mg/dL
Hgb A1c MFr Bld: 5.3 % (ref 4.8–5.6)

## 2022-01-23 LAB — CARDIOVASCULAR RISK ASSESSMENT

## 2022-02-01 ENCOUNTER — Encounter: Payer: Self-pay | Admitting: Family Medicine

## 2022-02-01 ENCOUNTER — Ambulatory Visit (INDEPENDENT_AMBULATORY_CARE_PROVIDER_SITE_OTHER): Payer: 59 | Admitting: Family Medicine

## 2022-02-01 ENCOUNTER — Ambulatory Visit: Payer: 59 | Admitting: Family Medicine

## 2022-02-01 VITALS — BP 122/78 | HR 82 | Temp 97.3°F | Ht 66.0 in | Wt 177.0 lb

## 2022-02-01 DIAGNOSIS — B370 Candidal stomatitis: Secondary | ICD-10-CM | POA: Diagnosis not present

## 2022-02-01 MED ORDER — CYCLOBENZAPRINE HCL 5 MG PO TABS: ORAL_TABLET | ORAL | 2 refills | Status: DC

## 2022-02-01 MED ORDER — FLUCONAZOLE 100 MG PO TABS: ORAL_TABLET | ORAL | 0 refills | Status: DC

## 2022-02-01 NOTE — Progress Notes (Signed)
? ?Acute Office Visit ? ?Subjective:  ? ? Patient ID: [name], female    DOB: 02-26-1966, 56 y.o.   MRN: [id number] ? ?Chief Complaint  ?Patient presents with  ? Thrush  ? ? ?HPI ?Patient is in today complaining of thrush in posterior throat. Patient has history of candidal esophagitis which was treated for 14 days. She is concerned this has recurred. It feels like it did.  ? ?Past Medical History:  ?Diagnosis Date  ? Allergic rhinitis   ? Arthritis   ? Blood transfusion without reported diagnosis   ? COPD (chronic obstructive pulmonary disease) ([place])   ? GERD (gastroesophageal reflux disease)   ? History of colon polyps   ? HTN (hypertension)   ? Hyperlipidemia   ? Kidney stones   ? Pancreatitis   ? Pneumonia   ? Primary hypertension 10/20/2021  ? Psychotic depression ([place])   ? Shingles February 26th, 2001  ? ? ?Past Surgical History:  ?Procedure Laterality Date  ? ABDOMINAL HYSTERECTOMY    ? CESAREAN SECTION    ? COLONOSCOPY  05/02/2017  ? Colonic polyp status post polypectomy. Small internal hemorrhoids  ? GALLBLADDER SURGERY    ? REPLACEMENT TOTAL KNEE Right 02/09/2019  ? TOTAL KNEE ARTHROPLASTY  02/09/2019  ? TUBAL LIGATION    ? ? ?Family History  ?Problem Relation Age of Onset  ? COPD Mother   ? Parkinson's disease Mother   ? COPD Father   ? Arthritis Father   ? Lung disease Father   ? Colon cancer Cousin   ?     mother's cousin  ? Breast cancer Maternal Aunt   ?     great aunt  ? Diabetes Other   ? Stroke Other   ? Hypertension Other   ? Hyperlipidemia Other   ? Asthma Other   ? Heart failure Other   ? Thyroid disease Other   ? Heart attack Other   ? COPD Other   ? Arrhythmia Other   ? Arthritis Other   ? Migraines Other   ? ? ?Social History  ? ?Socioeconomic History  ? Marital status: Widowed  ?  Spouse name: Not on file  ? Number of children: 2  ? Years of education: Not on file  ? Highest education level: High school graduate  ?Occupational History  ? Not on file  ?Tobacco Use  ? Smoking status:  Every Day  ?  Packs/day: 1.00  ?  Years: 20.00  ?  Pack years: 20.00  ?  Types: Cigarettes  ? Smokeless tobacco: Never  ?Vaping Use  ? Vaping Use: Never used  ?Substance and Sexual Activity  ? Alcohol use: Yes  ? Drug use: No  ? Sexual activity: Not on file  ?Other Topics Concern  ? Not on file  ?Social History Narrative  ? Not on file  ? ?Social Determinants of Health  ? ?Financial Resource Strain: Not on file  ?Food Insecurity: Not on file  ?Transportation Needs: Not on file  ?Physical Activity: Not on file  ?Stress: Not on file  ?Social Connections: Not on file  ?Intimate Partner Violence: Not on file  ? ? ?Outpatient Medications Prior to Visit  ?Medication Sig Dispense Refill  ? albuterol (PROVENTIL) (2.5 MG/3ML) 0.083% nebulizer solution as needed.   6  ? albuterol (VENTOLIN HFA) 108 (90 Base) MCG/ACT inhaler INHALE 2 PUFFS INTO THE LUNGS EVERY 6 HOURS AS NEEDED FOR WHEEZING OR SHORTNESS OF BREATH 6.7 g 3  ? ALPRAZolam (  XANAX) 0.25 MG tablet TAKE 1 TABLET(0.25 MG) BY MOUTH DAILY AS NEEDED FOR ANXIETY 30 tablet 2  ? amLODipine (NORVASC) 10 MG tablet TAKE 1 TABLET(10 MG) BY MOUTH DAILY Strength: 10 mg 90 tablet 1  ? atorvastatin (LIPITOR) 80 MG tablet Take 1 tablet (80 mg total) by mouth daily. 90 tablet 3  ? dicyclomine (BENTYL) 10 MG capsule TAKE 1 CAPSULE(10 MG) BY MOUTH FOUR TIMES DAILY BEFORE MEALS AND AT BEDTIME 120 capsule 2  ? famotidine (PEPCID) 40 MG tablet Take 1 tablet (40 mg total) by mouth daily. 90 tablet 1  ? fluticasone (FLONASE) 50 MCG/ACT nasal spray Place 2 sprays into both nostrils daily. 16 g 6  ? Fluticasone-Umeclidin-Vilant (TRELEGY ELLIPTA) 200-62.5-25 MCG/ACT AEPB Inhale 1 each into the lungs daily. 28 each 2  ? furosemide (LASIX) 20 MG tablet TAKE 1 TABLET(20 MG) BY MOUTH DAILY 30 tablet 3  ? gabapentin (NEURONTIN) 300 MG capsule Take 1 capsule (300 mg total) by mouth at bedtime as needed. 1 capsule 0  ? linaclotide (LINZESS) 145 MCG CAPS capsule Take 1 capsule (145 mcg total) by mouth  daily before breakfast. 90 capsule 0  ? montelukast (SINGULAIR) 10 MG tablet TAKE 1 TABLET(10 MG) BY MOUTH DAILY 90 tablet 0  ? Multiple Vitamins-Minerals (50+ ADULT EYE HEALTH PO) Take 1 tablet by mouth daily.    ? mupirocin cream (BACTROBAN) 2 % Apply 1 application. topically 2 (two) times daily. Apply to right arm abrasion twice daily 15 g 0  ? nitroGLYCERIN (NITROSTAT) 0.4 MG SL tablet DISSOLVE 1 TABLET BY MOUTH UNDER THE TONGUE AS NEEDED FOR CHEST PAIN EVERY 5 MINUTES UP TO 3 TIMES 25 tablet 0  ? pantoprazole (PROTONIX) 40 MG tablet TAKE 1 TABLET(40 MG) BY MOUTH DAILY 90 tablet 1  ? Semaglutide, 1 MG/DOSE, (OZEMPIC, 1 MG/DOSE,) 4 MG/3ML SOPN Inject 1 mg into the skin once a week. 9 mL 0  ? triamcinolone (KENALOG) 0.025 % ointment Apply 1 application. topically 2 (two) times daily. Apply to corners of mouth twice daily 30 g 0  ? vitamin E 180 MG (400 UNITS) capsule Take by mouth.    ? cyclobenzaprine (FLEXERIL) 5 MG tablet TAKE 1 TABLET(5 MG) BY MOUTH THREE TIMES DAILY AS NEEDED FOR MUSCLE SPASMS 90 tablet 0  ? ?No facility-administered medications prior to visit.  ? ? ?Allergies  ?Allergen Reactions  ? Levofloxacin Other (See Comments) and Rash  ?  Rash all over redness ?  ? Bactrim Ds [Sulfamethoxazole-Trimethoprim] Nausea Only  ? Levaquin [Levofloxacin In D5w]   ? Tape Hives  ? Codeine Nausea Only  ? Other Rash  ?  bandaids leave a red area if left on too long  ? ? ?Review of Systems  ?Constitutional:  Negative for appetite change, fatigue and fever.  ?HENT:  Positive for trouble swallowing. Negative for congestion, ear pain, sinus pressure and sore throat.   ?Respiratory:  Negative for cough, chest tightness, shortness of breath and wheezing.   ?Cardiovascular:  Negative for chest pain and palpitations.  ?Gastrointestinal:  Negative for abdominal pain, constipation, diarrhea, nausea and vomiting.  ?Genitourinary:  Negative for dysuria and hematuria.  ?Musculoskeletal:  Negative for arthralgias, back pain,  joint swelling and myalgias.  ?Skin:  Negative for rash.  ?Neurological:  Negative for dizziness, weakness and headaches.  ?Psychiatric/Behavioral:  Negative for dysphoric mood. The patient is not nervous/anxious.   ? ?   ?Objective:  ?  ?Physical Exam ?Vitals reviewed.  ?Constitutional:   ?  Appearance: Normal appearance.  ?HENT:  ?   Mouth/Throat:  ?   Pharynx: Posterior oropharyngeal erythema (irritated. no obvious thrush.) present. No oropharyngeal exudate.  ?Cardiovascular:  ?   Rate and Rhythm: Normal rate and regular rhythm.  ?   Heart sounds: Normal heart sounds.  ?Pulmonary:  ?   Effort: Pulmonary effort is normal.  ?   Breath sounds: Normal breath sounds.  ?Neurological:  ?   Mental Status: She is alert and oriented to person, place, and time.  ?Psychiatric:     ?   Mood and Affect: Mood normal.     ?   Behavior: Behavior normal.  ? ?BP 122/78 (BP Location: Left Arm, Patient Position: Sitting)   Pulse 82   Temp (!) 97.3 ?F (36.3 ?C) (Temporal)   Ht $R'5\' 6"'Ek$  (1.676 m)   Wt 177 lb (80.3 kg)   LMP  (LMP Unknown) Comment: hysterectomy 2008  SpO2 98%   BMI 28.57 kg/m?  ?Wt Readings from Last 3 Encounters:  ?02/01/22 177 lb (80.3 kg)  ?01/22/22 183 lb (83 kg)  ?12/17/21 187 lb (84.8 kg)  ? ? ?Health Maintenance Due  ?Topic Date Due  ? PAP SMEAR-Modifier  04/05/2017  ? ? ?There are no preventive care reminders to display for this patient. ? ? ?Lab Results  ?Component Value Date  ? TSH 3.120 10/20/2021  ? ?Lab Results  ?Component Value Date  ? WBC 7.9 01/22/2022  ? HGB 15.3 01/22/2022  ? HCT 45.0 01/22/2022  ? MCV 96 01/22/2022  ? PLT 288 01/22/2022  ? ?Lab Results  ?Component Value Date  ? NA 140 01/22/2022  ? K 4.7 01/22/2022  ? CO2 26 01/22/2022  ? GLUCOSE 96 01/22/2022  ? BUN 13 01/22/2022  ? CREATININE 0.58 01/22/2022  ? BILITOT 0.3 01/22/2022  ? ALKPHOS 89 01/22/2022  ? AST 20 01/22/2022  ? ALT 27 01/22/2022  ? PROT 6.8 01/22/2022  ? ALBUMIN 4.7 01/22/2022  ? CALCIUM 9.4 01/22/2022  ? EGFR 106  01/22/2022  ? ?Lab Results  ?Component Value Date  ? CHOL 164 01/22/2022  ? ?Lab Results  ?Component Value Date  ? HDL 60 01/22/2022  ? ?Lab Results  ?Component Value Date  ? [place] 83 01/22/2022  ? ?Lab Results  ?[place]

## 2022-02-02 ENCOUNTER — Telehealth: Payer: Self-pay

## 2022-02-02 NOTE — Telephone Encounter (Signed)
PRIOR AUTH WAS DONE THROUGH PATIENT'S INSURANCE FOR THE LINZESS, AND PATIENT WAS DENIED DUE TO MEDICATION BEING NON FORMULARY AND THE PATIENT HAS [employer] INSURANCE WHICH DOES NOT PAY FOR A LOT OF MEDICATIONS FYI. ?

## 2022-02-03 DIAGNOSIS — B370 Candidal stomatitis: Secondary | ICD-10-CM | POA: Insufficient documentation

## 2022-02-03 NOTE — Telephone Encounter (Signed)
Spoke with patient, verbalized understanding and had no questions at this time.  

## 2022-02-03 NOTE — Assessment & Plan Note (Signed)
Concerning for possible candidiasis esophagitis.  ?Start on fluconazole 100 mg 2 daily x first day, then decrease to 100 mg daily x 13 days.  ?

## 2022-02-17 ENCOUNTER — Other Ambulatory Visit: Payer: Self-pay | Admitting: Gastroenterology

## 2022-03-01 ENCOUNTER — Ambulatory Visit: Payer: 59 | Admitting: Family Medicine

## 2022-03-02 ENCOUNTER — Encounter: Payer: Self-pay | Admitting: Family Medicine

## 2022-03-02 ENCOUNTER — Ambulatory Visit (INDEPENDENT_AMBULATORY_CARE_PROVIDER_SITE_OTHER): Payer: 59 | Admitting: Family Medicine

## 2022-03-02 ENCOUNTER — Other Ambulatory Visit: Payer: Self-pay | Admitting: Nurse Practitioner

## 2022-03-02 VITALS — BP 118/70 | HR 78 | Temp 98.4°F | Resp 18 | Ht 66.0 in | Wt 178.0 lb

## 2022-03-02 DIAGNOSIS — J441 Chronic obstructive pulmonary disease with (acute) exacerbation: Secondary | ICD-10-CM

## 2022-03-02 DIAGNOSIS — J0181 Other acute recurrent sinusitis: Secondary | ICD-10-CM | POA: Diagnosis not present

## 2022-03-02 DIAGNOSIS — J309 Allergic rhinitis, unspecified: Secondary | ICD-10-CM

## 2022-03-02 MED ORDER — TRIAMCINOLONE ACETONIDE 40 MG/ML IJ SUSP
80.0000 mg | Freq: Once | INTRAMUSCULAR | Status: AC
  Administered 2022-03-02: 80 mg via INTRAMUSCULAR

## 2022-03-02 MED ORDER — FLUTICASONE-SALMETEROL 115-21 MCG/ACT IN AERO: 2.0000 | INHALATION_SPRAY | Freq: Two times a day (BID) | RESPIRATORY_TRACT | 12 refills | Status: DC

## 2022-03-02 MED ORDER — AMOXICILLIN-POT CLAVULANATE 875-125 MG PO TABS: 1.0000 | ORAL_TABLET | Freq: Two times a day (BID) | ORAL | 0 refills | Status: DC

## 2022-03-02 MED ORDER — FLUCONAZOLE 150 MG PO TABS: 150.0000 mg | ORAL_TABLET | Freq: Every day | ORAL | 0 refills | Status: DC

## 2022-03-02 NOTE — Progress Notes (Signed)
Acute Office Visit  Subjective:    Patient ID: [name], female    DOB: 03-Sep-1966, 56 y.o.   MRN: [id number]  Chief Complaint  Patient presents with   Cough   Sore Throat    HPI: Patient is in today for cough, sore throat, fatigue, congestion. Denies fever, shortness of breath. Symptoms began last Wednesday.   Past Medical History:  Diagnosis Date   Allergic rhinitis    Arthritis    Blood transfusion without reported diagnosis    COPD (chronic obstructive pulmonary disease) (HCC)    GERD (gastroesophageal reflux disease)    History of colon polyps    HTN (hypertension)    Hyperlipidemia    Kidney stones    Pancreatitis    Pneumonia    Primary hypertension 10/20/2021   Psychotic depression ([place])    Shingles February 26th, 2001    Past Surgical History:  Procedure Laterality Date   ABDOMINAL HYSTERECTOMY     CESAREAN SECTION     COLONOSCOPY  05/02/2017   Colonic polyp status post polypectomy. Small internal hemorrhoids   GALLBLADDER SURGERY     REPLACEMENT TOTAL KNEE Right 02/09/2019   TOTAL KNEE ARTHROPLASTY  02/09/2019   TUBAL LIGATION      Family History  Problem Relation Age of Onset   COPD Mother    Parkinson's disease Mother    COPD Father    Arthritis Father    Lung disease Father    Colon cancer Cousin        mother's cousin   Breast cancer Maternal Aunt        great aunt   Diabetes Other    Stroke Other    Hypertension Other    Hyperlipidemia Other    Asthma Other    Heart failure Other    Thyroid disease Other    Heart attack Other    COPD Other    Arrhythmia Other    Arthritis Other    Migraines Other     Social History   Socioeconomic History   Marital status: Widowed    Spouse name: Not on file   Number of children: 2   Years of education: Not on file   Highest education level: High school graduate  Occupational History   Not on file  Tobacco Use   Smoking status: Every Day    Packs/day: 1.00    Years: 20.00     Total pack years: 20.00    Types: Cigarettes   Smokeless tobacco: Never  Vaping Use   Vaping Use: Never used  Substance and Sexual Activity   Alcohol use: Yes   Drug use: No   Sexual activity: Not on file  Other Topics Concern   Not on file  Social History Narrative   Not on file   Social Determinants of Health   Financial Resource Strain: Not on file  Food Insecurity: Not on file  Transportation Needs: Not on file  Physical Activity: Not on file  Stress: Not on file  Social Connections: Not on file  Intimate Partner Violence: Not on file    Outpatient Medications Prior to Visit  Medication Sig Dispense Refill   albuterol (PROVENTIL) (2.5 MG/3ML) 0.083% nebulizer solution as needed.   6   albuterol (VENTOLIN HFA) 108 (90 Base) MCG/ACT inhaler INHALE 2 PUFFS INTO THE LUNGS EVERY 6 HOURS AS NEEDED FOR WHEEZING OR SHORTNESS OF BREATH 6.7 g 3   ALPRAZolam (XANAX) 0.25 MG tablet TAKE 1 TABLET(0.25 MG) BY  MOUTH DAILY AS NEEDED FOR ANXIETY 30 tablet 2   amLODipine (NORVASC) 10 MG tablet TAKE 1 TABLET(10 MG) BY MOUTH DAILY Strength: 10 mg 90 tablet 1   atorvastatin (LIPITOR) 80 MG tablet Take 1 tablet (80 mg total) by mouth daily. 90 tablet 3   cyclobenzaprine (FLEXERIL) 5 MG tablet TAKE 1 TABLET(5 MG) BY MOUTH THREE TIMES DAILY AS NEEDED FOR MUSCLE SPASMS 90 tablet 2   dicyclomine (BENTYL) 10 MG capsule TAKE 1 CAPSULE(10 MG) BY MOUTH FOUR TIMES DAILY BEFORE MEALS AND AT BEDTIME 120 capsule 2   famotidine (PEPCID) 40 MG tablet Take 1 tablet (40 mg total) by mouth daily. 90 tablet 1   fluticasone (FLONASE) 50 MCG/ACT nasal spray SHAKE LIQUID AND USE 2 SPRAYS IN EACH NOSTRIL DAILY 16 g 6   furosemide (LASIX) 20 MG tablet TAKE 1 TABLET(20 MG) BY MOUTH DAILY 30 tablet 3   gabapentin (NEURONTIN) 300 MG capsule Take 1 capsule (300 mg total) by mouth at bedtime as needed. 1 capsule 0   linaclotide (LINZESS) 145 MCG CAPS capsule Take 1 capsule (145 mcg total) by mouth daily before breakfast. 90  capsule 0   montelukast (SINGULAIR) 10 MG tablet TAKE 1 TABLET(10 MG) BY MOUTH DAILY 90 tablet 0   Multiple Vitamins-Minerals (50+ ADULT EYE HEALTH PO) Take 1 tablet by mouth daily.     mupirocin cream (BACTROBAN) 2 % Apply 1 application. topically 2 (two) times daily. Apply to right arm abrasion twice daily 15 g 0   nitroGLYCERIN (NITROSTAT) 0.4 MG SL tablet DISSOLVE 1 TABLET BY MOUTH UNDER THE TONGUE AS NEEDED FOR CHEST PAIN EVERY 5 MINUTES UP TO 3 TIMES 25 tablet 0   Semaglutide, 1 MG/DOSE, (OZEMPIC, 1 MG/DOSE,) 4 MG/3ML SOPN Inject 1 mg into the skin once a week. 9 mL 0   triamcinolone (KENALOG) 0.025 % ointment Apply 1 application. topically 2 (two) times daily. Apply to corners of mouth twice daily 30 g 0   vitamin E 180 MG (400 UNITS) capsule Take by mouth.     pantoprazole (PROTONIX) 40 MG tablet TAKE 1 TABLET(40 MG) BY MOUTH DAILY 90 tablet 1   Fluticasone-Umeclidin-Vilant (TRELEGY ELLIPTA) 200-62.5-25 MCG/ACT AEPB Inhale 1 each into the lungs daily. 28 each 2   No facility-administered medications prior to visit.    Allergies  Allergen Reactions   Levofloxacin Other (See Comments) and Rash    Rash all over redness    Bactrim Ds [Sulfamethoxazole-Trimethoprim] Nausea Only   Levaquin [Levofloxacin In D5w]    Tape Hives   Codeine Nausea Only   Other Rash    bandaids leave a red area if left on too long    Review of Systems  Constitutional:  Positive for fatigue. Negative for appetite change and fever.  HENT:  Positive for congestion, rhinorrhea and sore throat. Negative for ear pain and sinus pressure.   Eyes:  Negative for pain.       Blurry vision  Respiratory:  Positive for cough. Negative for shortness of breath and wheezing.   Cardiovascular:  Negative for chest pain and palpitations.  Gastrointestinal:  Positive for constipation. Negative for abdominal pain, diarrhea, nausea and vomiting.  Genitourinary:  Negative for dysuria and frequency.  Musculoskeletal:  Positive  for arthralgias, back pain and myalgias. Negative for joint swelling.  Skin:  Negative for rash.  Neurological:  Positive for headaches. Negative for dizziness and weakness.  Psychiatric/Behavioral:  Negative for dysphoric mood. The patient is not nervous/anxious.  Objective:    Physical Exam Vitals reviewed.  Constitutional:      Appearance: Normal appearance. She is normal weight.  HENT:     Right Ear: Tympanic membrane, ear canal and external ear normal.     Left Ear: Tympanic membrane, ear canal and external ear normal.     Nose: Congestion and rhinorrhea present.     Mouth/Throat:     Pharynx: Oropharynx is clear. Posterior oropharyngeal erythema present. No oropharyngeal exudate.  Cardiovascular:     Rate and Rhythm: Normal rate and regular rhythm.     Pulses: Normal pulses.     Heart sounds: Normal heart sounds. No murmur heard. Pulmonary:     Effort: Pulmonary effort is normal. No respiratory distress.     Breath sounds: Wheezing present.  Abdominal:     General: Bowel sounds are normal.  Lymphadenopathy:     Cervical: No cervical adenopathy.  Neurological:     Mental Status: She is alert and oriented to person, place, and time. Mental status is at baseline.  Psychiatric:        Mood and Affect: Mood normal.        Behavior: Behavior normal.     BP 118/70   Pulse 78   Temp 98.4 F (36.9 C)   Resp 18   Ht _0  (1.676 m)   Wt 178 lb (80.7 kg)   LMP  (LMP Unknown) Comment: hysterectomy 2008  SpO2 96%   BMI 28.73 kg/m  Wt Readings from Last 3 Encounters:  03/02/22 178 lb (80.7 kg)  02/01/22 177 lb (80.3 kg)  01/22/22 183 lb (83 kg)    Health Maintenance Due  Topic Date Due   PAP SMEAR-Modifier  04/05/2017    There are no preventive care reminders to display for this patient.   Lab Results  Component Value Date   TSH 3.120 10/20/2021   Lab Results  Component Value Date   WBC 7.9 01/22/2022   HGB 15.3 01/22/2022   HCT 45.0 01/22/2022    MCV 96 01/22/2022   PLT 288 01/22/2022   Lab Results  Component Value Date   NA 140 01/22/2022   K 4.7 01/22/2022   CO2 26 01/22/2022   GLUCOSE 96 01/22/2022   BUN 13 01/22/2022   CREATININE 0.58 01/22/2022   BILITOT 0.3 01/22/2022   ALKPHOS 89 01/22/2022   AST 20 01/22/2022   ALT 27 01/22/2022   PROT 6.8 01/22/2022   ALBUMIN 4.7 01/22/2022   CALCIUM 9.4 01/22/2022   EGFR 106 01/22/2022   Lab Results  Component Value Date   CHOL 164 01/22/2022   Lab Results  Component Value Date   HDL 60 01/22/2022   Lab Results  Component Value Date   LDLCALC 83 01/22/2022   Lab Results  Component Value Date   TRIG 116 01/22/2022   Lab Results  Component Value Date   CHOLHDL 2.7 01/22/2022   Lab Results  Component Value Date   HGBA1C 5.3 01/22/2022       Assessment & Plan:   Problem List Items Addressed This Visit       Respiratory   Other acute recurrent sinusitis    Rx: augmentin. Fluconazole for candidiasis.       Relevant Medications   amoxicillin-clavulanate (AUGMENTIN) 875-125 MG tablet   fluconazole (DIFLUCAN) 150 MG tablet   COPD exacerbation ([place]) - Primary    Rx: advair and augmentin.      Relevant Medications   amoxicillin-clavulanate (AUGMENTIN) 875-125 MG  tablet   fluticasone-salmeterol (ADVAIR HFA) 115-21 MCG/ACT inhaler   Meds ordered this encounter  Medications   amoxicillin-clavulanate (AUGMENTIN) 875-125 MG tablet    Sig: Take 1 tablet by mouth 2 (two) times daily.    Dispense:  20 tablet    Refill:  0   triamcinolone acetonide (KENALOG-40) injection 80 mg   fluticasone-salmeterol (ADVAIR HFA) 115-21 MCG/ACT inhaler    Sig: Inhale 2 puffs into the lungs 2 (two) times daily.    Dispense:  1 each    Refill:  12   fluconazole (DIFLUCAN) 150 MG tablet    Sig: Take 1 tablet (150 mg total) by mouth daily.    Dispense:  3 tablet    Refill:  0      Follow-up: Return if symptoms worsen or fail to improve.  An After Visit Summary was  printed and given to the patient.   Rochel Brome, MD [facility] [phone]

## 2022-03-06 NOTE — Assessment & Plan Note (Signed)
Rx: augmentin. Fluconazole for candidiasis.

## 2022-03-06 NOTE — Assessment & Plan Note (Signed)
Rx: advair and augmentin.

## 2022-03-25 ENCOUNTER — Ambulatory Visit (INDEPENDENT_AMBULATORY_CARE_PROVIDER_SITE_OTHER): Payer: 59 | Admitting: Family Medicine

## 2022-03-25 ENCOUNTER — Encounter: Payer: Self-pay | Admitting: Family Medicine

## 2022-03-25 VITALS — BP 136/82 | HR 78 | Temp 97.8°F | Ht 66.0 in | Wt 175.0 lb

## 2022-03-25 DIAGNOSIS — D692 Other nonthrombocytopenic purpura: Secondary | ICD-10-CM | POA: Insufficient documentation

## 2022-03-25 DIAGNOSIS — Z6828 Body mass index (BMI) 28.0-28.9, adult: Secondary | ICD-10-CM

## 2022-03-25 DIAGNOSIS — R7303 Prediabetes: Secondary | ICD-10-CM

## 2022-03-25 DIAGNOSIS — E782 Mixed hyperlipidemia: Secondary | ICD-10-CM

## 2022-03-25 DIAGNOSIS — E663 Overweight: Secondary | ICD-10-CM

## 2022-03-25 MED ORDER — SEMAGLUTIDE (2 MG/DOSE) 8 MG/3ML ~~LOC~~ SOPN: 2.0000 mg | PEN_INJECTOR | SUBCUTANEOUS | 0 refills | Status: DC

## 2022-03-25 MED ORDER — ATORVASTATIN CALCIUM 80 MG PO TABS: 40.0000 mg | ORAL_TABLET | Freq: Every day | ORAL | 0 refills | Status: DC

## 2022-03-25 MED ORDER — FUROSEMIDE 20 MG PO TABS
20.0000 mg | ORAL_TABLET | Freq: Every day | ORAL | 0 refills | Status: DC | PRN
Start: 2022-03-25 — End: 2023-12-01

## 2022-03-25 NOTE — Assessment & Plan Note (Signed)
Change lipitor 80 mg 1/2 pill daily.  IContinue otc vitamin K.

## 2022-03-25 NOTE — Assessment & Plan Note (Signed)
increase ozempic 2 mg weekly.  Recommend continue to work on eating healthy diet and exercise.

## 2022-03-25 NOTE — Assessment & Plan Note (Signed)
Decrease lipitor to 40 mg daily.

## 2022-03-25 NOTE — Assessment & Plan Note (Signed)
Increase ozempic 2 mg weekly.  Recommend continue to work on eating healthy diet and exercise.

## 2022-03-25 NOTE — Progress Notes (Signed)
Acute Office Visit  Subjective:    Patient ID: [name], female    DOB: 06-Aug-1966, 56 y.o.   MRN: [id number]  Chief Complaint  Patient presents with   Bruising easily    HPI Patient is in today for result of forearms.  This seemed to worsen when we increase her Lipitor from 40 to 80 mg nightly.  The patient's been trying Mederma, Arnica and most recently taking an over-the-counter vitamin K supplement.  Just the last 3 days it does seem to be improving.  She is also taking ibuprofen 6 to 800 mg to 3 times per day for back pain and hip pain.  Patient is doing well with the weight loss on Ozempic.  She is lost approximately 20 pounds.  Tolerating it well.  COPD: Patient started Advair 3 days ago.  Her chest congestion is improving.Still has a little bit green sputum but otherwise improving.  Past Medical History:  Diagnosis Date   Allergic rhinitis    Arthritis    Blood transfusion without reported diagnosis    COPD (chronic obstructive pulmonary disease) (HCC)    GERD (gastroesophageal reflux disease)    History of colon polyps    HTN (hypertension)    Hyperlipidemia    Kidney stones    Pancreatitis    Pneumonia    Primary hypertension 10/20/2021   Psychotic depression ([place])    Shingles February 26th, 2001    Past Surgical History:  Procedure Laterality Date   ABDOMINAL HYSTERECTOMY     CESAREAN SECTION     COLONOSCOPY  05/02/2017   Colonic polyp status post polypectomy. Small internal hemorrhoids   GALLBLADDER SURGERY     REPLACEMENT TOTAL KNEE Right 02/09/2019   TOTAL KNEE ARTHROPLASTY  02/09/2019   TUBAL LIGATION      Family History  Problem Relation Age of Onset   COPD Mother    Parkinson's disease Mother    COPD Father    Arthritis Father    Lung disease Father    Colon cancer Cousin        mother's cousin   Breast cancer Maternal Aunt        great aunt   Diabetes Other    Stroke Other    Hypertension Other    Hyperlipidemia Other    Asthma  Other    Heart failure Other    Thyroid disease Other    Heart attack Other    COPD Other    Arrhythmia Other    Arthritis Other    Migraines Other     Social History   Socioeconomic History   Marital status: Widowed    Spouse name: Not on file   Number of children: 2   Years of education: Not on file   Highest education level: High school graduate  Occupational History   Not on file  Tobacco Use   Smoking status: Every Day    Packs/day: 1.00    Years: 20.00    Total pack years: 20.00    Types: Cigarettes   Smokeless tobacco: Never  Vaping Use   Vaping Use: Never used  Substance and Sexual Activity   Alcohol use: Yes   Drug use: No   Sexual activity: Not on file  Other Topics Concern   Not on file  Social History Narrative   Not on file   Social Determinants of Health   Financial Resource Strain: Not on file  Food Insecurity: Not on file  Transportation Needs: Not  on file  Physical Activity: Not on file  Stress: Not on file  Social Connections: Not on file  Intimate Partner Violence: Not on file    Outpatient Medications Prior to Visit  Medication Sig Dispense Refill   albuterol (VENTOLIN HFA) 108 (90 Base) MCG/ACT inhaler Inhale 2 puffs into the lungs every 6 (six) hours as needed.     ALPRAZolam (XANAX) 0.25 MG tablet TAKE 1 TABLET(0.25 MG) BY MOUTH DAILY AS NEEDED FOR ANXIETY 30 tablet 2   amLODipine (NORVASC) 10 MG tablet TAKE 1 TABLET(10 MG) BY MOUTH DAILY Strength: 10 mg 90 tablet 1   cyclobenzaprine (FLEXERIL) 5 MG tablet TAKE 1 TABLET(5 MG) BY MOUTH THREE TIMES DAILY AS NEEDED FOR MUSCLE SPASMS 90 tablet 2   dicyclomine (BENTYL) 10 MG capsule TAKE 1 CAPSULE(10 MG) BY MOUTH FOUR TIMES DAILY BEFORE MEALS AND AT BEDTIME 120 capsule 2   famotidine (PEPCID) 40 MG tablet Take 1 tablet (40 mg total) by mouth daily. 90 tablet 1   fluticasone (FLONASE) 50 MCG/ACT nasal spray SHAKE LIQUID AND USE 2 SPRAYS IN EACH NOSTRIL DAILY 16 g 6   fluticasone-salmeterol  (ADVAIR HFA) 115-21 MCG/ACT inhaler Inhale 2 puffs into the lungs 2 (two) times daily. 1 each 12   gabapentin (NEURONTIN) 300 MG capsule Take 1 capsule (300 mg total) by mouth at bedtime as needed. 1 capsule 0   linaclotide (LINZESS) 145 MCG CAPS capsule Take 1 capsule (145 mcg total) by mouth daily before breakfast. 90 capsule 0   montelukast (SINGULAIR) 10 MG tablet TAKE 1 TABLET(10 MG) BY MOUTH DAILY 90 tablet 0   Multiple Vitamins-Minerals (50+ ADULT EYE HEALTH PO) Take 1 tablet by mouth daily.     mupirocin cream (BACTROBAN) 2 % Apply 1 application. topically 2 (two) times daily. Apply to right arm abrasion twice daily 15 g 0   nitroGLYCERIN (NITROSTAT) 0.4 MG SL tablet DISSOLVE 1 TABLET BY MOUTH UNDER THE TONGUE AS NEEDED FOR CHEST PAIN EVERY 5 MINUTES UP TO 3 TIMES 25 tablet 0   triamcinolone (KENALOG) 0.025 % ointment Apply 1 application. topically 2 (two) times daily. Apply to corners of mouth twice daily 30 g 0   vitamin E 180 MG (400 UNITS) capsule Take by mouth.     atorvastatin (LIPITOR) 80 MG tablet Take 1 tablet (80 mg total) by mouth daily. 90 tablet 3   furosemide (LASIX) 20 MG tablet TAKE 1 TABLET(20 MG) BY MOUTH DAILY 30 tablet 3   Semaglutide, 1 MG/DOSE, (OZEMPIC, 1 MG/DOSE,) 4 MG/3ML SOPN Inject 1 mg into the skin once a week. 9 mL 0   albuterol (PROVENTIL) (2.5 MG/3ML) 0.083% nebulizer solution as needed.   6   albuterol (VENTOLIN HFA) 108 (90 Base) MCG/ACT inhaler INHALE 2 PUFFS INTO THE LUNGS EVERY 6 HOURS AS NEEDED FOR WHEEZING OR SHORTNESS OF BREATH 6.7 g 3   amoxicillin-clavulanate (AUGMENTIN) 875-125 MG tablet Take 1 tablet by mouth 2 (two) times daily. 20 tablet 0   fluconazole (DIFLUCAN) 150 MG tablet Take 1 tablet (150 mg total) by mouth daily. 3 tablet 0   No facility-administered medications prior to visit.    Allergies  Allergen Reactions   Levofloxacin Other (See Comments) and Rash    Rash all over redness    Bactrim Ds [Sulfamethoxazole-Trimethoprim]  Nausea Only   Levaquin [Levofloxacin In D5w]    Tape Hives   Codeine Nausea Only   Other Rash    bandaids leave a red area if left on too long  Review of Systems  Constitutional:  Negative for appetite change, fatigue and fever.  HENT:  Negative for congestion, ear pain, sinus pressure and sore throat.   Respiratory:  Negative for cough, chest tightness, shortness of breath and wheezing.   Cardiovascular:  Negative for chest pain and palpitations.  Gastrointestinal:  Negative for abdominal pain, constipation, diarrhea, nausea and vomiting.  Genitourinary:  Negative for dysuria and hematuria.  Musculoskeletal:  Negative for arthralgias, back pain, joint swelling and myalgias.  Skin:  Positive for color change (Easy bruising on bilateral arms.). Negative for rash.  Neurological:  Negative for dizziness, weakness and headaches.  Psychiatric/Behavioral:  Negative for dysphoric mood. The patient is not nervous/anxious.        Objective:    Physical Exam Vitals reviewed.  Constitutional:      Appearance: Normal appearance. She is normal weight.  Neck:     Vascular: No carotid bruit.  Cardiovascular:     Rate and Rhythm: Normal rate and regular rhythm.     Heart sounds: Normal heart sounds.  Pulmonary:     Effort: Pulmonary effort is normal.     Breath sounds: Normal breath sounds.  Skin:    Findings: Bruising (purpura on BL forearms. minimal on legs.) present.  Neurological:     Mental Status: She is alert and oriented to person, place, and time.  Psychiatric:        Mood and Affect: Mood normal.        Behavior: Behavior normal.     BP 136/82 (BP Location: Left Arm, Patient Position: Sitting)   Pulse 78   Temp 97.8 F (36.6 C) (Temporal)   Ht _0  (1.676 m)   Wt 175 lb (79.4 kg)   LMP  (LMP Unknown) Comment: hysterectomy 2008  SpO2 98%   BMI 28.25 kg/m  Wt Readings from Last 3 Encounters:  03/25/22 175 lb (79.4 kg)  03/02/22 178 lb (80.7 kg)  02/01/22 177 lb  (80.3 kg)    Health Maintenance Due  Topic Date Due   PAP SMEAR-Modifier  04/05/2017    There are no preventive care reminders to display for this patient.   Lab Results  Component Value Date   TSH 3.120 10/20/2021   Lab Results  Component Value Date   WBC 7.9 01/22/2022   HGB 15.3 01/22/2022   HCT 45.0 01/22/2022   MCV 96 01/22/2022   PLT 288 01/22/2022   Lab Results  Component Value Date   NA 140 01/22/2022   K 4.7 01/22/2022   CO2 26 01/22/2022   GLUCOSE 96 01/22/2022   BUN 13 01/22/2022   CREATININE 0.58 01/22/2022   BILITOT 0.3 01/22/2022   ALKPHOS 89 01/22/2022   AST 20 01/22/2022   ALT 27 01/22/2022   PROT 6.8 01/22/2022   ALBUMIN 4.7 01/22/2022   CALCIUM 9.4 01/22/2022   EGFR 106 01/22/2022   Lab Results  Component Value Date   CHOL 164 01/22/2022   Lab Results  Component Value Date   HDL 60 01/22/2022   Lab Results  Component Value Date   LDLCALC 83 01/22/2022   Lab Results  Component Value Date   TRIG 116 01/22/2022   Lab Results  Component Value Date   CHOLHDL 2.7 01/22/2022   Lab Results  Component Value Date   HGBA1C 5.3 01/22/2022         Assessment & Plan:   Problem List Items Addressed This Visit       Musculoskeletal and Integument  Purpura ([facility]CC) - Primary    Change lipitor 80 mg 1/2 pill daily.  IContinue otc vitamin K.       Relevant Orders   Comprehensive metabolic panel   CBC with Differential/Platelet   Protime-INR     Other   Mixed hyperlipidemia    Decrease lipitor to 40 mg daily.      Relevant Medications   furosemide (LASIX) 20 MG tablet   atorvastatin (LIPITOR) 80 MG tablet   Prediabetes    Increase ozempic 2 mg weekly.  Recommend continue to work on eating healthy diet and exercise.      Overweight with body mass index (BMI) of 28 to 28.9 in adult    increase ozempic 2 mg weekly.  Recommend continue to work on eating healthy diet and exercise.       Meds ordered this encounter   Medications   furosemide (LASIX) 20 MG tablet    Sig: Take 1 tablet (20 mg total) by mouth daily as needed for edema.    Dispense:  1 tablet    Refill:  0   Semaglutide, 2 MG/DOSE, 8 MG/3ML SOPN    Sig: Inject 2 mg as directed once a week.    Dispense:  9 mL    Refill:  0   atorvastatin (LIPITOR) 80 MG tablet    Sig: Take 0.5 tablets (40 mg total) by mouth daily.    Dispense:  1 tablet    Refill:  0    I,Lauren M Auman,acting as a scribe for Rochel Brome, MD.,have documented all relevant documentation on the behalf of Rochel Brome, MD,as directed by  Rochel Brome, MD while in the presence of Rochel Brome, MD.   Follow up in 04/2022 Rochel Brome, MD

## 2022-03-25 NOTE — Patient Instructions (Signed)
Change lipitor 80 mg 1/2 pill daily.  Increase ozempic 2 mg weekly.  Recommend continue to work on eating healthy diet and exercise. Continue otc vitamin K.

## 2022-03-26 LAB — CBC WITH DIFFERENTIAL/PLATELET
Basophils Absolute: 0 10*3/uL (ref 0.0–0.2)
Basos: 0 %
EOS (ABSOLUTE): 0.2 10*3/uL (ref 0.0–0.4)
Eos: 2 %
Hematocrit: 41.4 % (ref 34.0–46.6)
Hemoglobin: 14.4 g/dL (ref 11.1–15.9)
Immature Grans (Abs): 0 10*3/uL (ref 0.0–0.1)
Immature Granulocytes: 0 %
Lymphocytes Absolute: 1.8 10*3/uL (ref 0.7–3.1)
Lymphs: 21 %
MCH: 33.6 pg — ABNORMAL HIGH (ref 26.6–33.0)
MCHC: 34.8 g/dL (ref 31.5–35.7)
MCV: 97 fL (ref 79–97)
Monocytes Absolute: 0.6 10*3/uL (ref 0.1–0.9)
Monocytes: 7 %
Neutrophils Absolute: 5.9 10*3/uL (ref 1.4–7.0)
Neutrophils: 70 %
Platelets: 297 10*3/uL (ref 150–450)
RBC: 4.29 x10E6/uL (ref 3.77–5.28)
RDW: 12.4 % (ref 11.7–15.4)
WBC: 8.4 10*3/uL (ref 3.4–10.8)

## 2022-03-26 LAB — COMPREHENSIVE METABOLIC PANEL
ALT: 24 IU/L (ref 0–32)
AST: 16 IU/L (ref 0–40)
Albumin/Globulin Ratio: 2.2 (ref 1.2–2.2)
Albumin: 4.7 g/dL (ref 3.8–4.9)
Alkaline Phosphatase: 90 IU/L (ref 44–121)
BUN/Creatinine Ratio: 11 (ref 9–23)
BUN: 6 mg/dL (ref 6–24)
Bilirubin Total: 0.2 mg/dL (ref 0.0–1.2)
CO2: 25 mmol/L (ref 20–29)
Calcium: 10.1 mg/dL (ref 8.7–10.2)
Chloride: 100 mmol/L (ref 96–106)
Creatinine, Ser: 0.56 mg/dL — ABNORMAL LOW (ref 0.57–1.00)
Globulin, Total: 2.1 g/dL (ref 1.5–4.5)
Glucose: 90 mg/dL (ref 70–99)
Potassium: 4.2 mmol/L (ref 3.5–5.2)
Sodium: 143 mmol/L (ref 134–144)
Total Protein: 6.8 g/dL (ref 6.0–8.5)
eGFR: 107 mL/min/{1.73_m2} (ref >59)

## 2022-03-26 LAB — PROTIME-INR
INR: 0.9 (ref 0.9–1.2)
Prothrombin Time: 9.5 s (ref 9.1–12.0)

## 2022-04-10 ENCOUNTER — Other Ambulatory Visit: Payer: Self-pay | Admitting: Family Medicine

## 2022-04-10 DIAGNOSIS — G629 Polyneuropathy, unspecified: Secondary | ICD-10-CM

## 2022-04-12 ENCOUNTER — Other Ambulatory Visit: Payer: Self-pay | Admitting: Family Medicine

## 2022-04-13 MED ORDER — GABAPENTIN 300 MG PO CAPS: 300.0000 mg | ORAL_CAPSULE | Freq: Three times a day (TID) | ORAL | 2 refills | Status: DC

## 2022-04-14 ENCOUNTER — Other Ambulatory Visit: Payer: Self-pay

## 2022-04-14 ENCOUNTER — Encounter: Payer: Self-pay | Admitting: Family Medicine

## 2022-04-14 DIAGNOSIS — G629 Polyneuropathy, unspecified: Secondary | ICD-10-CM

## 2022-04-14 MED ORDER — GABAPENTIN 300 MG PO CAPS
300.0000 mg | ORAL_CAPSULE | Freq: Three times a day (TID) | ORAL | 2 refills | Status: DC
Start: 2022-04-14 — End: 2022-04-19

## 2022-04-19 ENCOUNTER — Other Ambulatory Visit: Payer: Self-pay

## 2022-04-19 DIAGNOSIS — G629 Polyneuropathy, unspecified: Secondary | ICD-10-CM

## 2022-04-19 MED ORDER — GABAPENTIN 300 MG PO CAPS: 300.0000 mg | ORAL_CAPSULE | Freq: Three times a day (TID) | ORAL | 1 refills | Status: DC

## 2022-04-20 ENCOUNTER — Ambulatory Visit: Payer: 59 | Admitting: Family Medicine

## 2022-04-30 ENCOUNTER — Ambulatory Visit: Payer: 59 | Admitting: Family Medicine

## 2022-05-06 ENCOUNTER — Ambulatory Visit (INDEPENDENT_AMBULATORY_CARE_PROVIDER_SITE_OTHER): Payer: 59 | Admitting: Family Medicine

## 2022-05-06 VITALS — BP 130/70 | HR 78 | Temp 97.1°F | Resp 16 | Ht 66.0 in | Wt 165.0 lb

## 2022-05-06 DIAGNOSIS — E782 Mixed hyperlipidemia: Secondary | ICD-10-CM

## 2022-05-06 DIAGNOSIS — R7303 Prediabetes: Secondary | ICD-10-CM

## 2022-05-06 DIAGNOSIS — D692 Other nonthrombocytopenic purpura: Secondary | ICD-10-CM

## 2022-05-06 DIAGNOSIS — Z23 Encounter for immunization: Secondary | ICD-10-CM | POA: Insufficient documentation

## 2022-05-06 DIAGNOSIS — Z90710 Acquired absence of both cervix and uterus: Secondary | ICD-10-CM | POA: Insufficient documentation

## 2022-05-06 DIAGNOSIS — E663 Overweight: Secondary | ICD-10-CM

## 2022-05-06 DIAGNOSIS — J4540 Moderate persistent asthma, uncomplicated: Secondary | ICD-10-CM

## 2022-05-06 DIAGNOSIS — F411 Generalized anxiety disorder: Secondary | ICD-10-CM

## 2022-05-06 DIAGNOSIS — I700 Atherosclerosis of aorta: Secondary | ICD-10-CM

## 2022-05-06 DIAGNOSIS — Z6828 Body mass index (BMI) 28.0-28.9, adult: Secondary | ICD-10-CM

## 2022-05-06 MED ORDER — ALPRAZOLAM 0.25 MG PO TABS: ORAL_TABLET | ORAL | 2 refills | Status: DC

## 2022-05-06 MED ORDER — METHOCARBAMOL 500 MG PO TABS: 500.0000 mg | ORAL_TABLET | Freq: Four times a day (QID) | ORAL | 0 refills | Status: DC

## 2022-05-06 NOTE — Assessment & Plan Note (Signed)
The current medical regimen is effective; continue present plan and medications. Continue trelegy one inhalation daily. Continue albuterol HFA 2 puffs four times a day as needed per shortness of breath. Continue Singulair '10mg'$  daily. Recommend to stop smoking.

## 2022-05-06 NOTE — Assessment & Plan Note (Signed)
Labs drawn today. Continue to take lipitor 40 mg daily. Continue to eat healthy and diet with exercise.

## 2022-05-06 NOTE — Assessment & Plan Note (Signed)
Labs drawn today. Continue Ozempic '2mg'$  weekly. Recommend continue to work on eating healthy diet and exercise.

## 2022-05-06 NOTE — Progress Notes (Signed)
Subjective:  Patient ID: [name], female    DOB: Jan 26, 1966  Age: 56 y.o. MRN: [id number]  Chief Complaint  Patient presents with   COPD   Prediabetes   Gastroesophageal Reflux   HPI Hyperlipidemia/Aortic atherosclerosis:  ON July 6th, we dropped lipitor 80 mg daily to 40 mg daily due to bruising. She stopped lipitor 40 mg daily was still causing bruising and causing myalgias. Feels better off it. Stopped this past Thursday. Previously tried on zetia,but did not tolerate it.   Hypertension: On amlodipine 10 mg daily, furosemide 20 mg as needed (rarely takes).  GERD: Pepcid 40 mg daily.  Prediabetes; a1c 5.3. Eating healthy.  On ozempic 2 mg weekly. Has helped with weight loss of 25 lbs.   Patient lost job 08/2022. Laid off due to decreased business. Has not been able to get another job despite trying hard. She is applying for disability.   Chronic back pain: on gabapentin 300 mg before bed as needed. Cyclobenzaprine 5 mg three times a day, but does not help. .   Asthma: Albuterol hfa 2 puffs four times a day as needed shortness of breath. On Advair 2 puffs twice one daily. On singulair.     Current Outpatient Medications on File Prior to Visit  Medication Sig Dispense Refill   albuterol (VENTOLIN HFA) 108 (90 Base) MCG/ACT inhaler Inhale 2 puffs into the lungs every 6 (six) hours as needed.     amLODipine (NORVASC) 10 MG tablet TAKE 1 TABLET(10 MG) BY MOUTH DAILY Strength: 10 mg 90 tablet 1   dicyclomine (BENTYL) 10 MG capsule TAKE 1 CAPSULE(10 MG) BY MOUTH FOUR TIMES DAILY BEFORE MEALS AND AT BEDTIME 120 capsule 2   famotidine (PEPCID) 40 MG tablet Take 1 tablet (40 mg total) by mouth daily. 90 tablet 1   fluticasone (FLONASE) 50 MCG/ACT nasal spray SHAKE LIQUID AND USE 2 SPRAYS IN EACH NOSTRIL DAILY 16 g 6   fluticasone-salmeterol (ADVAIR HFA) 115-21 MCG/ACT inhaler Inhale 2 puffs into the lungs 2 (two) times daily. 1 each 12   furosemide (LASIX) 20 MG tablet Take 1  tablet (20 mg total) by mouth daily as needed for edema. 1 tablet 0   gabapentin (NEURONTIN) 300 MG capsule Take 1 capsule (300 mg total) by mouth 3 (three) times daily. 90 capsule 1   montelukast (SINGULAIR) 10 MG tablet TAKE 1 TABLET(10 MG) BY MOUTH DAILY 90 tablet 0   Multiple Vitamins-Minerals (50+ ADULT EYE HEALTH PO) Take 1 tablet by mouth daily.     nitroGLYCERIN (NITROSTAT) 0.4 MG SL tablet DISSOLVE 1 TABLET BY MOUTH UNDER THE TONGUE AS NEEDED FOR CHEST PAIN EVERY 5 MINUTES UP TO 3 TIMES 25 tablet 0   OZEMPIC, 2 MG/DOSE, 8 MG/3ML SOPN INJECT 2 MG AS DIRECTED ONCE A WEEK 9 mL 0   triamcinolone (KENALOG) 0.025 % ointment Apply 1 application. topically 2 (two) times daily. Apply to corners of mouth twice daily 30 g 0   vitamin E 180 MG (400 UNITS) capsule Take by mouth.     vitamin k 100 MCG tablet Take 100 mcg by mouth daily.     No current facility-administered medications on file prior to visit.   Past Medical History:  Diagnosis Date   Allergic rhinitis    Arthritis    Blood transfusion without reported diagnosis    COPD (chronic obstructive pulmonary disease) (HCC)    GERD (gastroesophageal reflux disease)    History of colon polyps    HTN (hypertension)  Hyperlipidemia    Kidney stones    Pancreatitis    Pneumonia    Primary hypertension 10/20/2021   Psychotic depression [facility])    Shingles February 26th, 2001   Past Surgical History:  Procedure Laterality Date   ABDOMINAL HYSTERECTOMY     CESAREAN SECTION     COLONOSCOPY  05/02/2017   Colonic polyp status post polypectomy. Small internal hemorrhoids   GALLBLADDER SURGERY     REPLACEMENT TOTAL KNEE Right 02/09/2019   TOTAL KNEE ARTHROPLASTY  02/09/2019   TUBAL LIGATION      Family History  Problem Relation Age of Onset   COPD Mother    Parkinson's disease Mother    COPD Father    Arthritis Father    Lung disease Father    Colon cancer Cousin        mother's cousin   Breast cancer Maternal Aunt        great  aunt   Diabetes Other    Stroke Other    Hypertension Other    Hyperlipidemia Other    Asthma Other    Heart failure Other    Thyroid disease Other    Heart attack Other    COPD Other    Arrhythmia Other    Arthritis Other    Migraines Other    Social History   Socioeconomic History   Marital status: Widowed    Spouse name: Not on file   Number of children: 2   Years of education: Not on file   Highest education level: High school graduate  Occupational History   Not on file  Tobacco Use   Smoking status: Every Day    Packs/day: 1.00    Years: 20.00    Total pack years: 20.00    Types: Cigarettes   Smokeless tobacco: Never  Vaping Use   Vaping Use: Never used  Substance and Sexual Activity   Alcohol use: Yes   Drug use: No   Sexual activity: Not on file  Other Topics Concern   Not on file  Social History Narrative   Not on file   Social Determinants of Health   Financial Resource Strain: Not on file  Food Insecurity: Not on file  Transportation Needs: Not on file  Physical Activity: Not on file  Stress: Not on file  Social Connections: Not on file    Review of Systems  Constitutional:  Negative for chills, fatigue and fever.  HENT:  Negative for congestion, rhinorrhea and sore throat.   Respiratory:  Positive for shortness of breath and wheezing. Negative for cough.   Cardiovascular:  Positive for chest pain.  Gastrointestinal:  Positive for constipation. Negative for abdominal pain, diarrhea, nausea and vomiting.  Genitourinary:  Negative for dysuria and urgency.  Musculoskeletal:  Positive for back pain and myalgias.  Neurological:  Positive for weakness. Negative for dizziness, light-headedness and headaches.  Psychiatric/Behavioral:  Negative for dysphoric mood. The patient is not nervous/anxious.     Objective:  BP 130/70   Pulse 78   Temp (!) 97.1 F (36.2 C)   Resp 16   Ht '5\' 6"'$  (1.676 m)   Wt 165 lb (74.8 kg)   LMP  (LMP Unknown)  Comment: hysterectomy 2008  BMI 26.63 kg/m      05/06/2022    8:29 AM 03/25/2022    9:54 AM 03/02/2022   10:06 AM  BP/Weight  Systolic BP 157 262 035  Diastolic BP 70 82 70  Wt. (Lbs) 165 175 178  BMI 26.63 kg/m2 28.25 kg/m2 28.73 kg/m2    Physical Exam Vitals reviewed.  Constitutional:      Appearance: Normal appearance. She is normal weight.  Neck:     Vascular: No carotid bruit.  Cardiovascular:     Rate and Rhythm: Normal rate and regular rhythm.     Heart sounds: Normal heart sounds.  Pulmonary:     Effort: Pulmonary effort is normal. No respiratory distress.     Breath sounds: Normal breath sounds.  Abdominal:     General: Abdomen is flat. Bowel sounds are normal.     Palpations: Abdomen is soft.     Tenderness: There is no abdominal tenderness.  Neurological:     Mental Status: She is alert and oriented to person, place, and time.  Psychiatric:        Mood and Affect: Mood normal.        Behavior: Behavior normal.     Diabetic Foot Exam - Simple   No data filed      Lab Results  Component Value Date   WBC 8.8 05/06/2022   HGB 14.9 05/06/2022   HCT 45.3 05/06/2022   PLT 304 05/06/2022   GLUCOSE 87 05/06/2022   CHOL 212 (H) 05/06/2022   TRIG 180 (H) 05/06/2022   HDL 45 05/06/2022   LDLCALC 135 (H) 05/06/2022   ALT 21 05/06/2022   AST 15 05/06/2022   NA 138 05/06/2022   K 5.0 05/06/2022   CL 100 05/06/2022   CREATININE 0.61 05/06/2022   BUN 9 05/06/2022   CO2 24 05/06/2022   TSH 3.120 10/20/2021   INR 0.9 03/25/2022   HGBA1C 5.3 01/22/2022      Assessment & Plan:   Problem List Items Addressed This Visit       Cardiovascular and Mediastinum   Aortic atherosclerosis ([place])    Recommend repatha as intolerant to statins and zetia.         Respiratory   Moderate persistent asthma without complication    The current medical regimen is effective; continue present plan and medications. Continue trelegy one inhalation daily. Continue  albuterol HFA 2 puffs four times a day as needed per shortness of breath. Continue Singulair '10mg'$  daily. Recommend to stop smoking.        Musculoskeletal and Integument   Purpura (HCC)     Genitourinary   Absence of both cervix and uterus, acquired     Other   Mixed hyperlipidemia - Primary    Labs drawn today. Continue to take lipitor 40 mg daily. Continue to eat healthy and diet with exercise.      Relevant Medications   methocarbamol (ROBAXIN) 500 MG tablet   Other Relevant Orders   CBC with Differential/Platelet (Completed)   Comprehensive metabolic panel (Completed)   Lipid panel (Completed)   GAD (generalized anxiety disorder)    Continue Xanax prn anxiety. Patient takes sparingly.      Relevant Medications   ALPRAZolam (XANAX) 0.25 MG tablet   Prediabetes    Labs drawn today. Continue Ozempic '2mg'$  weekly. Recommend continue to work on eating healthy diet and exercise.      Overweight with body mass index (BMI) of 28 to 28.9 in adult   Need for vaccination for Strep pneumoniae    Prevnar 20 Administered.       Relevant Orders   Pneumococcal conjugate vaccine 20-valent (Prevnar 20) (Completed)  .  Meds ordered this encounter  Medications   ALPRAZolam (XANAX) 0.25 MG tablet  Sig: TAKE 1 TABLET(0.25 MG) BY MOUTH DAILY AS NEEDED FOR ANXIETY    Dispense:  30 tablet    Refill:  2   methocarbamol (ROBAXIN) 500 MG tablet    Sig: Take 1 tablet (500 mg total) by mouth 4 (four) times daily.    Dispense:  120 tablet    Refill:  0    Orders Placed This Encounter  Procedures   Pneumococcal conjugate vaccine 20-valent (Prevnar 20)   CBC with Differential/Platelet   Comprehensive metabolic panel   Lipid panel     Follow-up: Return in about 3 months (around 08/06/2022) for chronic fasting.  An After Visit Summary was printed and given to the patient.  Rochel Brome, MD [facility] [phone]

## 2022-05-06 NOTE — Assessment & Plan Note (Signed)
Continue Xanax prn anxiety. Patient takes sparingly.

## 2022-05-06 NOTE — Assessment & Plan Note (Signed)
Prevnar 20 Administered.

## 2022-05-06 NOTE — Assessment & Plan Note (Signed)
>>  ASSESSMENT AND PLAN FOR MODERATE PERSISTENT ASTHMA WITHOUT COMPLICATION/COPD WRITTEN ON 05/06/2022  9:39 AM BY Precious Reel, CMA  The current medical regimen is effective; continue present plan and medications. Continue trelegy one inhalation daily. Continue albuterol HFA 2 puffs four times a day as needed per shortness of breath. Continue Singulair 10mg  daily. Recommend to stop smoking.

## 2022-05-07 LAB — CBC WITH DIFFERENTIAL/PLATELET
Basophils Absolute: 0.1 10*3/uL (ref 0.0–0.2)
Basos: 1 %
EOS (ABSOLUTE): 0.2 10*3/uL (ref 0.0–0.4)
Eos: 3 %
Hematocrit: 45.3 % (ref 34.0–46.6)
Hemoglobin: 14.9 g/dL (ref 11.1–15.9)
Immature Grans (Abs): 0 10*3/uL (ref 0.0–0.1)
Immature Granulocytes: 0 %
Lymphocytes Absolute: 2.1 10*3/uL (ref 0.7–3.1)
Lymphs: 24 %
MCH: 32 pg (ref 26.6–33.0)
MCHC: 32.9 g/dL (ref 31.5–35.7)
MCV: 97 fL (ref 79–97)
Monocytes Absolute: 0.5 10*3/uL (ref 0.1–0.9)
Monocytes: 6 %
Neutrophils Absolute: 5.9 10*3/uL (ref 1.4–7.0)
Neutrophils: 66 %
Platelets: 304 10*3/uL (ref 150–450)
RBC: 4.66 x10E6/uL (ref 3.77–5.28)
RDW: 11.6 % — ABNORMAL LOW (ref 11.7–15.4)
WBC: 8.8 10*3/uL (ref 3.4–10.8)

## 2022-05-07 LAB — COMPREHENSIVE METABOLIC PANEL
ALT: 21 IU/L (ref 0–32)
AST: 15 IU/L (ref 0–40)
Albumin/Globulin Ratio: 2.4 — ABNORMAL HIGH (ref 1.2–2.2)
Albumin: 4.8 g/dL (ref 3.8–4.9)
Alkaline Phosphatase: 90 IU/L (ref 44–121)
BUN/Creatinine Ratio: 15 (ref 9–23)
BUN: 9 mg/dL (ref 6–24)
Bilirubin Total: 0.3 mg/dL (ref 0.0–1.2)
CO2: 24 mmol/L (ref 20–29)
Calcium: 10.1 mg/dL (ref 8.7–10.2)
Chloride: 100 mmol/L (ref 96–106)
Creatinine, Ser: 0.61 mg/dL (ref 0.57–1.00)
Globulin, Total: 2 g/dL (ref 1.5–4.5)
Glucose: 87 mg/dL (ref 70–99)
Potassium: 5 mmol/L (ref 3.5–5.2)
Sodium: 138 mmol/L (ref 134–144)
Total Protein: 6.8 g/dL (ref 6.0–8.5)
eGFR: 105 mL/min/{1.73_m2} (ref >59)

## 2022-05-07 LAB — LIPID PANEL
Chol/HDL Ratio: 4.7 ratio — ABNORMAL HIGH (ref 0.0–4.4)
Cholesterol, Total: 212 mg/dL — ABNORMAL HIGH (ref 100–199)
HDL: 45 mg/dL (ref >39)
LDL Chol Calc (NIH): 135 mg/dL — ABNORMAL HIGH (ref 0–99)
Triglycerides: 180 mg/dL — ABNORMAL HIGH (ref 0–149)
VLDL Cholesterol Cal: 32 mg/dL (ref 5–40)

## 2022-05-07 LAB — CARDIOVASCULAR RISK ASSESSMENT

## 2022-05-08 ENCOUNTER — Encounter: Payer: Self-pay | Admitting: Family Medicine

## 2022-05-08 DIAGNOSIS — I700 Atherosclerosis of aorta: Secondary | ICD-10-CM | POA: Insufficient documentation

## 2022-05-08 NOTE — Assessment & Plan Note (Signed)
Recommend repatha as intolerant to statins and zetia.

## 2022-05-11 ENCOUNTER — Other Ambulatory Visit: Payer: Self-pay | Admitting: Family Medicine

## 2022-05-11 DIAGNOSIS — I700 Atherosclerosis of aorta: Secondary | ICD-10-CM

## 2022-05-11 MED ORDER — REPATHA 140 MG/ML ~~LOC~~ SOSY: 140.0000 mg | PREFILLED_SYRINGE | SUBCUTANEOUS | 0 refills | Status: DC

## 2022-05-12 ENCOUNTER — Other Ambulatory Visit: Payer: Self-pay | Admitting: Family Medicine

## 2022-05-12 NOTE — Telephone Encounter (Signed)
Patient is on antibiotic for dentist. Tongue is sore like she previously had with yeast. (This happens when antibiotic patient states) She will complete antibiotic tomorrow.   Royce Macadamia, [place] 05/12/22 1:49 PM

## 2022-05-17 ENCOUNTER — Telehealth: Payer: Self-pay

## 2022-05-17 ENCOUNTER — Other Ambulatory Visit: Payer: Self-pay

## 2022-05-17 MED ORDER — ROSUVASTATIN CALCIUM 5 MG PO TABS: 5.0000 mg | ORAL_TABLET | Freq: Every day | ORAL | 0 refills | Status: DC

## 2022-05-17 NOTE — Telephone Encounter (Signed)
Patient was informed. I sent prescription to pharmacy.

## 2022-05-17 NOTE — Telephone Encounter (Signed)
Repatha was denied by her insurance.  She wants to try crestor again please.

## 2022-05-17 NOTE — Telephone Encounter (Signed)
Pharmacy called stating that the PA for Repatha has been denied - please advise if you are going to make an appeal or send another medication

## 2022-06-06 ENCOUNTER — Other Ambulatory Visit: Payer: Self-pay

## 2022-06-06 ENCOUNTER — Emergency Department (HOSPITAL_BASED_OUTPATIENT_CLINIC_OR_DEPARTMENT_OTHER)
Admission: EM | Admit: 2022-06-06 | Discharge: 2022-06-06 | Disposition: A | Discharge status: Home / Self Care | Payer: Self-pay | Attending: Emergency Medicine | Admitting: Emergency Medicine

## 2022-06-06 ENCOUNTER — Emergency Department (HOSPITAL_BASED_OUTPATIENT_CLINIC_OR_DEPARTMENT_OTHER): Payer: Self-pay

## 2022-06-06 ENCOUNTER — Emergency Department (HOSPITAL_BASED_OUTPATIENT_CLINIC_OR_DEPARTMENT_OTHER): Payer: Self-pay | Admitting: Radiology

## 2022-06-06 ENCOUNTER — Encounter (HOSPITAL_BASED_OUTPATIENT_CLINIC_OR_DEPARTMENT_OTHER): Payer: Self-pay | Admitting: Emergency Medicine

## 2022-06-06 DIAGNOSIS — K047 Periapical abscess without sinus: Secondary | ICD-10-CM | POA: Insufficient documentation

## 2022-06-06 DIAGNOSIS — L03211 Cellulitis of face: Secondary | ICD-10-CM | POA: Insufficient documentation

## 2022-06-06 DIAGNOSIS — D72829 Elevated white blood cell count, unspecified: Secondary | ICD-10-CM | POA: Insufficient documentation

## 2022-06-06 LAB — COMPREHENSIVE METABOLIC PANEL
ALT: 21 U/L (ref 0–44)
AST: 14 U/L — ABNORMAL LOW (ref 15–41)
Albumin: 4.4 g/dL (ref 3.5–5.0)
Alkaline Phosphatase: 86 U/L (ref 38–126)
Anion gap: 9 (ref 5–15)
BUN: 8 mg/dL (ref 6–20)
CO2: 26 mmol/L (ref 22–32)
Calcium: 9.4 mg/dL (ref 8.9–10.3)
Chloride: 100 mmol/L (ref 98–111)
Creatinine, Ser: 0.62 mg/dL (ref 0.44–1.00)
GFR, Estimated: >60 mL/min (ref >60)
Glucose, Bld: 122 mg/dL — ABNORMAL HIGH (ref 70–99)
Potassium: 3.5 mmol/L (ref 3.5–5.1)
Sodium: 135 mmol/L (ref 135–145)
Total Bilirubin: 0.3 mg/dL (ref 0.3–1.2)
Total Protein: 6.8 g/dL (ref 6.5–8.1)

## 2022-06-06 LAB — CBC WITH DIFFERENTIAL/PLATELET
Abs Immature Granulocytes: 0.06 10*3/uL (ref 0.00–0.07)
Basophils Absolute: 0 10*3/uL (ref 0.0–0.1)
Basophils Relative: 0 %
Eosinophils Absolute: 0.2 10*3/uL (ref 0.0–0.5)
Eosinophils Relative: 2 %
HCT: 38.3 % (ref 36.0–46.0)
Hemoglobin: 13.4 g/dL (ref 12.0–15.0)
Immature Granulocytes: 1 %
Lymphocytes Relative: 11 %
Lymphs Abs: 1.3 10*3/uL (ref 0.7–4.0)
MCH: 33 pg (ref 26.0–34.0)
MCHC: 35 g/dL (ref 30.0–36.0)
MCV: 94.3 fL (ref 80.0–100.0)
Monocytes Absolute: 0.8 10*3/uL (ref 0.1–1.0)
Monocytes Relative: 7 %
Neutro Abs: 9.9 10*3/uL — ABNORMAL HIGH (ref 1.7–7.7)
Neutrophils Relative %: 79 %
Platelets: 263 10*3/uL (ref 150–400)
RBC: 4.06 MIL/uL (ref 3.87–5.11)
RDW: 12 % (ref 11.5–15.5)
WBC: 12.4 10*3/uL — ABNORMAL HIGH (ref 4.0–10.5)
nRBC: 0 % (ref 0.0–0.2)

## 2022-06-06 LAB — URINALYSIS, ROUTINE W REFLEX MICROSCOPIC
Bilirubin Urine: NEGATIVE
Glucose, UA: NEGATIVE mg/dL
Hgb urine dipstick: NEGATIVE
Ketones, ur: NEGATIVE mg/dL
Leukocytes,Ua: NEGATIVE
Nitrite: NEGATIVE
Protein, ur: NEGATIVE mg/dL
Specific Gravity, Urine: 1.02 (ref 1.005–1.030)
pH: 6 (ref 5.0–8.0)

## 2022-06-06 LAB — LACTIC ACID, PLASMA: Lactic Acid, Venous: 1.1 mmol/L (ref 0.5–1.9)

## 2022-06-06 MED ORDER — IOHEXOL 300 MG/ML  SOLN
75.0000 mL | Freq: Once | INTRAMUSCULAR | Status: AC | PRN
  Administered 2022-06-06: 75 mL via INTRAVENOUS

## 2022-06-06 MED ORDER — DEXAMETHASONE SODIUM PHOSPHATE 10 MG/ML IJ SOLN
10.0000 mg | Freq: Once | INTRAMUSCULAR | Status: AC
  Administered 2022-06-06: 10 mg via INTRAVENOUS
  Filled 2022-06-06: qty 1

## 2022-06-06 MED ORDER — FENTANYL CITRATE PF 50 MCG/ML IJ SOSY
50.0000 ug | PREFILLED_SYRINGE | Freq: Once | INTRAMUSCULAR | Status: AC
  Administered 2022-06-06: 50 ug via INTRAVENOUS
  Filled 2022-06-06: qty 1

## 2022-06-06 MED ORDER — HYDROCODONE-ACETAMINOPHEN 5-325 MG PO TABS
1.0000 | ORAL_TABLET | Freq: Once | ORAL | Status: AC
  Administered 2022-06-06: 1 via ORAL
  Filled 2022-06-06: qty 1

## 2022-06-06 MED ORDER — HYDROCODONE-ACETAMINOPHEN 5-325 MG PO TABS: 1.0000 | ORAL_TABLET | Freq: Four times a day (QID) | ORAL | 0 refills | Status: DC | PRN

## 2022-06-06 NOTE — ED Triage Notes (Signed)
Pt was having right side jaw/tooth pain yesterday, overnight right facial swelling/including around the eye. Pt received rocephin injection from urgent care. Pt was on pcn since sept 5th due to ongoing process of dental implants and dentures.

## 2022-06-06 NOTE — Discharge Instructions (Signed)
Take antibiotics as prescribed. Take the entire course, even if symptoms improve Use warm compresses, 20 min at a time, 3 time a day for pain and swelling Take pain medication as needed for sever or breakthrough pain Call your dentist tomorrow for follow up Return to the ER with any new, worsening, or concerning symptoms

## 2022-06-06 NOTE — ED Provider Notes (Signed)
[facility] EMERGENCY DEPT Provider Note   CSN: [id number] Arrival date & time: 06/06/22  1638     History  Chief Complaint  Patient presents with   Facial Swelling    [name] is a 56 y.o. female presenting for evaluation of R facial swelling and pain.   Pt states she had dental implants removed on 09/05. She was placed on penicillins. She was having no pain, redness, or swelling until yesterday. She was seen at [facility] today, given rocephin and started on Augmentin. She presents to the ED as the pain and swelling have not improved.  H/o prediabetes for which she is on ozempic. She is no immunocompromised.   She denies fevers, ear pain, ST, cough. She has pain of the tissue around ehr eye, but not the eye itself. No vision changes.   HPI     Home Medications Prior to Admission medications   Medication Sig Start Date End Date Taking? Authorizing Provider  HYDROcodone-acetaminophen (NORCO/VICODIN) 5-325 MG tablet Take 1 tablet by mouth every 6 (six) hours as needed for severe pain or moderate pain. 06/06/22  Yes Yannely Kintzel, PA-C  albuterol (VENTOLIN HFA) 108 (90 Base) MCG/ACT inhaler Inhale 2 puffs into the lungs every 6 (six) hours as needed.    [provider]  ALPRAZolam (XANAX) 0.25 MG tablet TAKE 1 TABLET(0.25 MG) BY MOUTH DAILY AS NEEDED FOR ANXIETY 05/06/22   Cox, Kirsten, MD  amLODipine (NORVASC) 10 MG tablet TAKE 1 TABLET(10 MG) BY MOUTH DAILY Strength: 10 mg 10/20/21   Cox, Kirsten, MD  dicyclomine (BENTYL) 10 MG capsule TAKE 1 CAPSULE(10 MG) BY MOUTH FOUR TIMES DAILY BEFORE MEALS AND AT BEDTIME 10/02/21   Cox, Kirsten, MD  Evolocumab (REPATHA) 140 MG/ML SOSY Inject 140 mg into the skin every 14 (fourteen) days. 05/11/22   CoxElnita Maxwell, MD  famotidine (PEPCID) 40 MG tablet Take 1 tablet (40 mg total) by mouth daily. 01/22/22   Cox, Elnita Maxwell, MD  fluticasone [facility]) 50 MCG/ACT nasal spray SHAKE LIQUID AND USE 2 SPRAYS IN [facility] NOSTRIL DAILY 03/02/22    Rip Harbour, NP  fluticasone-salmeterol (ADVAIR HFA) 115-21 MCG/ACT inhaler Inhale 2 puffs into the lungs 2 (two) times daily. 03/02/22   Cox, Elnita Maxwell, MD  furosemide (LASIX) 20 MG tablet Take 1 tablet (20 mg total) by mouth daily as needed for edema. 03/25/22   Cox, Elnita Maxwell, MD  gabapentin (NEURONTIN) 300 MG capsule Take 1 capsule (300 mg total) by mouth 3 (three) times daily. 04/19/22   Cox, Elnita Maxwell, MD  methocarbamol (ROBAXIN) 500 MG tablet Take 1 tablet (500 mg total) by mouth 4 (four) times daily. 05/06/22   Cox, Kirsten, MD  montelukast (SINGULAIR) 10 MG tablet TAKE 1 TABLET(10 MG) BY MOUTH DAILY 10/26/21   Cox, Kirsten, MD  Multiple Vitamins-Minerals (50+ ADULT EYE HEALTH PO) Take 1 tablet by mouth daily.    [provider]  nitroGLYCERIN (NITROSTAT) 0.4 MG SL tablet DISSOLVE 1 TABLET BY MOUTH UNDER THE TONGUE AS NEEDED FOR CHEST PAIN EVERY 5 MINUTES UP TO 3 TIMES 12/23/20   Marge Duncans, PA-C  OZEMPIC, 2 MG/DOSE, 8 MG/3ML SOPN INJECT 2 MG AS DIRECTED ONCE A WEEK 04/12/22   Cox, Kirsten, MD  rosuvastatin (CRESTOR) 5 MG tablet Take 1 tablet (5 mg total) by mouth daily. 05/17/22   Cox, Elnita Maxwell, MD  triamcinolone (KENALOG) 0.025 % ointment Apply 1 application. topically 2 (two) times daily. Apply to corners of mouth twice daily 12/01/21   Rip Harbour, NP  vitamin E 180 MG (400 UNITS) capsule Take by mouth.    [provider]  vitamin k 100 MCG tablet Take 100 mcg by mouth daily.    [provider]      Allergies    Levofloxacin, Bactrim ds [sulfamethoxazole-trimethoprim], Levaquin [levofloxacin in d5w], Lipitor [atorvastatin], Tape, Zetia [ezetimibe], Codeine, and Other    Review of Systems   Review of Systems  Constitutional:  Negative for fever.  HENT:  Positive for dental problem and facial swelling.   All other systems reviewed and are negative.   Physical Exam Updated Vital Signs BP (!) 126/54 (BP Location: Right Arm)   Pulse 88   Temp 98.1 F (36.7  C)   Resp 18   LMP  (LMP Unknown) Comment: hysterectomy 2008  SpO2 99%  Physical Exam Vitals and nursing note reviewed.  Constitutional:      General: She is not in acute distress.    Appearance: Normal appearance.  HENT:     Head: Normocephalic and atraumatic.     Comments: Erythema and edema of the R side of the face extending from the upper lip to the lower eye lid.  No ttp of the lower teeth or upper dentures.  TMs without erythema Eyes:     Conjunctiva/sclera: Conjunctivae normal.     Pupils: Pupils are equal, round, and reactive to light.  Cardiovascular:     Rate and Rhythm: Normal rate and regular rhythm.     Pulses: Normal pulses.  Pulmonary:     Effort: Pulmonary effort is normal. No respiratory distress.     Breath sounds: Normal breath sounds. No wheezing.     Comments: Speaking in full sentences.  Clear lung sounds in all fields. Abdominal:     General: There is no distension.     Palpations: Abdomen is soft. There is no mass.     Tenderness: There is no abdominal tenderness. There is no guarding or rebound.  Musculoskeletal:        General: Normal range of motion.     Cervical back: Normal range of motion and neck supple.  Skin:    General: Skin is warm and dry.     Capillary Refill: Capillary refill takes less than 2 seconds.  Neurological:     Mental Status: She is alert and oriented to person, place, and time.  Psychiatric:        Mood and Affect: Mood and affect normal.        Speech: Speech normal.        Behavior: Behavior normal.     ED Results / Procedures / Treatments   Labs (all labs ordered are listed, but only abnormal results are displayed) Labs Reviewed  COMPREHENSIVE METABOLIC PANEL - Abnormal; Notable for the following components:      Result Value   Glucose, Bld 122 (*)    AST 14 (*)    All other components within normal limits  CBC WITH DIFFERENTIAL/PLATELET - Abnormal; Notable for the following components:   WBC 12.4 (*)     Neutro Abs 9.9 (*)    All other components within normal limits  LACTIC ACID, PLASMA  URINALYSIS, ROUTINE W REFLEX MICROSCOPIC    EKG None  Radiology CT Maxillofacial W Contrast  Result Date: 06/06/2022 CLINICAL DATA:  Right facial pain EXAM: CT MAXILLOFACIAL WITH CONTRAST TECHNIQUE: Multidetector CT imaging of the maxillofacial structures was performed with intravenous contrast. Multiplanar CT image reconstructions were also generated. RADIATION DOSE REDUCTION: This exam was  performed according to the departmental dose-optimization program which includes automated exposure control, adjustment of the mA and/or kV according to patient size and/or use of iterative reconstruction technique. CONTRAST:  12m OMNIPAQUE IOHEXOL 300 MG/ML  SOLN COMPARISON:  None Available. FINDINGS: Osseous: Areas of the oral cavity and adjacent soft tissues are obscured by streak artifacts from metallic dental hardware. There is inflammatory change in the soft tissues overlying the upper right maxillary teeth, greatest at the right nasolabial fold. There is no discrete abscess. Orbits: Negative. No traumatic or inflammatory finding. Sinuses: Clear. Soft tissues: Negative. Limited intracranial: No significant or unexpected finding. IMPRESSION: Inflammatory change in the soft tissues overlying the upper right maxillary teeth, greatest at the right nasolabial fold. No discrete abscess. Electronically Signed   By: KUlyses JarredM.D.   On: 06/06/2022 19:41   DG Chest 2 View  Result Date: 06/06/2022 CLINICAL DATA:  Dental infection. EXAM: CHEST - 2 VIEW COMPARISON:  05/20/2020 FINDINGS: Heart size appears normal. No pleural effusion or edema. No pleural effusion or scratch set no airspace opacities identified. Scarring noted within the right middle lobe. Visualized osseous structures are unremarkable. IMPRESSION: No active cardiopulmonary abnormalities. Electronically Signed   By: TKerby MoorsM.D.   On: 06/06/2022 17:41     Procedures Procedures    Medications Ordered in ED Medications  HYDROcodone-acetaminophen (NORCO/VICODIN) 5-325 MG per tablet 1 tablet (1 tablet Oral Given 06/06/22 1910)  iohexol (OMNIPAQUE) 300 MG/ML solution 75 mL (75 mLs Intravenous Contrast Given 06/06/22 1927)  dexamethasone (DECADRON) injection 10 mg (10 mg Intravenous Given 06/06/22 2112)  fentaNYL (SUBLIMAZE) injection 50 mcg (50 mcg Intravenous Given 06/06/22 2114)    ED Course/ Medical Decision Making/ A&P                           Medical Decision Making Amount and/or Complexity of Data Reviewed Labs: ordered. Radiology: ordered.  Risk Prescription drug management.    This patient presents to the ED for concern of facial pain and swelling. This involves an extensive number of treatment options, and is a complaint that carries with it a high risk of complications and morbidity.  The differential diagnosis includes cellulitis, dental abscess, preseptal cellulitis   Co morbidities:  Pre-diabetes   Lab Tests:  I ordered, and personally interpreted labs.  The pertinent results include:  mild leukocytosis of 12.4   Imaging Studies:  I ordered imaging studies including ct maxillofacial  I independently visualized and interpreted imaging which showed inflammation but no abscess.  I agree with the radiologist interpretation   Medicines ordered:  I ordered medication including norco, fentanyl, decadron  for infection, pain, and swelling. Pt already received rocephin and Augmentin, does not need further abx in the ED.    Disposition:  After consideration of the diagnostic results and the patients response to treatment, I feel that the patent would benefit from continue abx. As there is no deep space infection or abscess, will continue abx. As she was started on them today, she has not failed treatment as there has not been enough time. I discussed with pt and family importance of calling dentistry tomorrow for  evaluation. At this time, pt appears safe for d/c. Return precautions given. Pt states she understands and agrees to plan.          Final Clinical Impression(s) / ED Diagnoses Final diagnoses:  Facial cellulitis  Dental infection    Rx / DC Orders ED Discharge Orders  Ordered    HYDROcodone-acetaminophen (NORCO/VICODIN) 5-325 MG tablet  Every 6 hours PRN        06/06/22 2100              Franchot Heidelberg, PA-C 06/06/22 2134    Wyvonnia Dusky, MD 06/07/22 [phone]

## 2022-06-06 NOTE — ED Notes (Addendum)
Pt care taken. Pt complaining of pain and swelling to the rt jaw, gave vicodin that was ordered, awaiting her ct scan.

## 2022-06-08 ENCOUNTER — Encounter (HOSPITAL_BASED_OUTPATIENT_CLINIC_OR_DEPARTMENT_OTHER): Payer: Self-pay

## 2022-06-08 ENCOUNTER — Other Ambulatory Visit (HOSPITAL_BASED_OUTPATIENT_CLINIC_OR_DEPARTMENT_OTHER): Payer: Self-pay

## 2022-06-08 ENCOUNTER — Other Ambulatory Visit: Payer: Self-pay

## 2022-06-08 ENCOUNTER — Inpatient Hospital Stay (HOSPITAL_BASED_OUTPATIENT_CLINIC_OR_DEPARTMENT_OTHER)
Admission: EM | Admit: 2022-06-08 | Discharge: 2022-06-09 | DRG: 603 | Disposition: A | Discharge status: Home / Self Care | Payer: 59 | Attending: Internal Medicine | Admitting: Internal Medicine

## 2022-06-08 ENCOUNTER — Emergency Department (HOSPITAL_COMMUNITY)
Admission: EM | Admit: 2022-06-08 | Discharge: 2022-06-08 | Discharge status: Against Medical Advice | Payer: 59 | Attending: Emergency Medicine | Admitting: Emergency Medicine

## 2022-06-08 ENCOUNTER — Emergency Department (HOSPITAL_BASED_OUTPATIENT_CLINIC_OR_DEPARTMENT_OTHER): Payer: 59

## 2022-06-08 DIAGNOSIS — K219 Gastro-esophageal reflux disease without esophagitis: Secondary | ICD-10-CM | POA: Diagnosis present

## 2022-06-08 DIAGNOSIS — F411 Generalized anxiety disorder: Secondary | ICD-10-CM | POA: Diagnosis present

## 2022-06-08 DIAGNOSIS — R7303 Prediabetes: Secondary | ICD-10-CM | POA: Diagnosis present

## 2022-06-08 DIAGNOSIS — J4540 Moderate persistent asthma, uncomplicated: Secondary | ICD-10-CM | POA: Diagnosis present

## 2022-06-08 DIAGNOSIS — Z7985 Long-term (current) use of injectable non-insulin antidiabetic drugs: Secondary | ICD-10-CM

## 2022-06-08 DIAGNOSIS — Z5321 Procedure and treatment not carried out due to patient leaving prior to being seen by health care provider: Secondary | ICD-10-CM | POA: Insufficient documentation

## 2022-06-08 DIAGNOSIS — F17210 Nicotine dependence, cigarettes, uncomplicated: Secondary | ICD-10-CM | POA: Diagnosis present

## 2022-06-08 DIAGNOSIS — Z79899 Other long term (current) drug therapy: Secondary | ICD-10-CM

## 2022-06-08 DIAGNOSIS — E782 Mixed hyperlipidemia: Secondary | ICD-10-CM | POA: Diagnosis present

## 2022-06-08 DIAGNOSIS — L03211 Cellulitis of face: Principal | ICD-10-CM | POA: Diagnosis present

## 2022-06-08 DIAGNOSIS — Z720 Tobacco use: Secondary | ICD-10-CM

## 2022-06-08 DIAGNOSIS — Z888 Allergy status to other drugs, medicaments and biological substances status: Secondary | ICD-10-CM

## 2022-06-08 DIAGNOSIS — Z882 Allergy status to sulfonamides status: Secondary | ICD-10-CM

## 2022-06-08 DIAGNOSIS — U071 COVID-19: Secondary | ICD-10-CM | POA: Diagnosis present

## 2022-06-08 DIAGNOSIS — Z8249 Family history of ischemic heart disease and other diseases of the circulatory system: Secondary | ICD-10-CM

## 2022-06-08 DIAGNOSIS — J449 Chronic obstructive pulmonary disease, unspecified: Secondary | ICD-10-CM | POA: Diagnosis present

## 2022-06-08 DIAGNOSIS — K047 Periapical abscess without sinus: Secondary | ICD-10-CM | POA: Diagnosis present

## 2022-06-08 DIAGNOSIS — I10 Essential (primary) hypertension: Secondary | ICD-10-CM | POA: Diagnosis present

## 2022-06-08 DIAGNOSIS — Z881 Allergy status to other antibiotic agents status: Secondary | ICD-10-CM

## 2022-06-08 DIAGNOSIS — Z96651 Presence of right artificial knee joint: Secondary | ICD-10-CM | POA: Diagnosis present

## 2022-06-08 DIAGNOSIS — Z825 Family history of asthma and other chronic lower respiratory diseases: Secondary | ICD-10-CM

## 2022-06-08 DIAGNOSIS — K0889 Other specified disorders of teeth and supporting structures: Secondary | ICD-10-CM | POA: Insufficient documentation

## 2022-06-08 DIAGNOSIS — Z885 Allergy status to narcotic agent status: Secondary | ICD-10-CM

## 2022-06-08 DIAGNOSIS — R600 Localized edema: Secondary | ICD-10-CM | POA: Insufficient documentation

## 2022-06-08 DIAGNOSIS — E876 Hypokalemia: Secondary | ICD-10-CM | POA: Diagnosis present

## 2022-06-08 DIAGNOSIS — Z91048 Other nonmedicinal substance allergy status: Secondary | ICD-10-CM

## 2022-06-08 HISTORY — DX: Cellulitis of face: L03.211

## 2022-06-08 LAB — BASIC METABOLIC PANEL
Anion gap: 11 (ref 5–15)
BUN: 6 mg/dL (ref 6–20)
CO2: 26 mmol/L (ref 22–32)
Calcium: 9.5 mg/dL (ref 8.9–10.3)
Chloride: 100 mmol/L (ref 98–111)
Creatinine, Ser: 0.45 mg/dL (ref 0.44–1.00)
GFR, Estimated: >60 mL/min (ref >60)
Glucose, Bld: 108 mg/dL — ABNORMAL HIGH (ref 70–99)
Potassium: 3.5 mmol/L (ref 3.5–5.1)
Sodium: 137 mmol/L (ref 135–145)

## 2022-06-08 LAB — CBC WITH DIFFERENTIAL/PLATELET
Abs Immature Granulocytes: 0.06 10*3/uL (ref 0.00–0.07)
Basophils Absolute: 0 10*3/uL (ref 0.0–0.1)
Basophils Relative: 0 %
Eosinophils Absolute: 0.2 10*3/uL (ref 0.0–0.5)
Eosinophils Relative: 1 %
HCT: 41.8 % (ref 36.0–46.0)
Hemoglobin: 14.3 g/dL (ref 12.0–15.0)
Immature Granulocytes: 0 %
Lymphocytes Relative: 15 %
Lymphs Abs: 2.2 10*3/uL (ref 0.7–4.0)
MCH: 32.9 pg (ref 26.0–34.0)
MCHC: 34.2 g/dL (ref 30.0–36.0)
MCV: 96.1 fL (ref 80.0–100.0)
Monocytes Absolute: 0.9 10*3/uL (ref 0.1–1.0)
Monocytes Relative: 7 %
Neutro Abs: 10.9 10*3/uL — ABNORMAL HIGH (ref 1.7–7.7)
Neutrophils Relative %: 77 %
Platelets: 288 10*3/uL (ref 150–400)
RBC: 4.35 MIL/uL (ref 3.87–5.11)
RDW: 12.3 % (ref 11.5–15.5)
WBC: 14.2 10*3/uL — ABNORMAL HIGH (ref 4.0–10.5)
nRBC: 0 % (ref 0.0–0.2)

## 2022-06-08 MED ORDER — AMLODIPINE BESYLATE 10 MG PO TABS
10.0000 mg | ORAL_TABLET | Freq: Every day | ORAL | Status: DC
  Administered 2022-06-09: 10 mg via ORAL
  Filled 2022-06-08: qty 1

## 2022-06-08 MED ORDER — VANCOMYCIN HCL IN DEXTROSE 1-5 GM/200ML-% IV SOLN
1000.0000 mg | Freq: Two times a day (BID) | INTRAVENOUS | Status: DC
  Administered 2022-06-09: 1000 mg via INTRAVENOUS
  Filled 2022-06-08 (×2): qty 200

## 2022-06-08 MED ORDER — FAMOTIDINE 20 MG PO TABS
40.0000 mg | ORAL_TABLET | Freq: Every day | ORAL | Status: DC
  Administered 2022-06-09: 40 mg via ORAL
  Filled 2022-06-08: qty 2

## 2022-06-08 MED ORDER — FENTANYL CITRATE PF 50 MCG/ML IJ SOSY
50.0000 ug | PREFILLED_SYRINGE | Freq: Once | INTRAMUSCULAR | Status: AC
  Administered 2022-06-08: 50 ug via INTRAVENOUS
  Filled 2022-06-08: qty 1

## 2022-06-08 MED ORDER — PIPERACILLIN-TAZOBACTAM 3.375 G IVPB
3.3750 g | Freq: Three times a day (TID) | INTRAVENOUS | Status: DC
  Administered 2022-06-08 – 2022-06-09 (×3): 3.375 g via INTRAVENOUS
  Filled 2022-06-08 (×3): qty 50

## 2022-06-08 MED ORDER — VANCOMYCIN HCL IN DEXTROSE 1-5 GM/200ML-% IV SOLN
1000.0000 mg | Freq: Once | INTRAVENOUS | Status: AC
  Administered 2022-06-08: 1000 mg via INTRAVENOUS
  Filled 2022-06-08: qty 200

## 2022-06-08 MED ORDER — CLINDAMYCIN PHOSPHATE 600 MG/50ML IV SOLN
600.0000 mg | Freq: Once | INTRAVENOUS | Status: AC
  Administered 2022-06-08: 600 mg via INTRAVENOUS
  Filled 2022-06-08: qty 50

## 2022-06-08 MED ORDER — VANCOMYCIN HCL 500 MG IV SOLR
500.0000 mg | Freq: Once | INTRAVENOUS | Status: AC
  Administered 2022-06-08: 500 mg via INTRAVENOUS

## 2022-06-08 MED ORDER — DICYCLOMINE HCL 10 MG PO CAPS
10.0000 mg | ORAL_CAPSULE | Freq: Three times a day (TID) | ORAL | Status: DC
  Administered 2022-06-09: 10 mg via ORAL
  Filled 2022-06-08 (×4): qty 1

## 2022-06-08 MED ORDER — LACTATED RINGERS IV SOLN: INTRAVENOUS | Status: DC

## 2022-06-08 MED ORDER — ACETAMINOPHEN 650 MG RE SUPP: 650.0000 mg | Freq: Four times a day (QID) | RECTAL | Status: DC | PRN

## 2022-06-08 MED ORDER — MONTELUKAST SODIUM 10 MG PO TABS: 10.0000 mg | ORAL_TABLET | Freq: Every day | ORAL | Status: DC

## 2022-06-08 MED ORDER — MOMETASONE FURO-FORMOTEROL FUM 200-5 MCG/ACT IN AERO
2.0000 | INHALATION_SPRAY | Freq: Two times a day (BID) | RESPIRATORY_TRACT | Status: DC
  Filled 2022-06-08 (×2): qty 8.8

## 2022-06-08 MED ORDER — MORPHINE SULFATE (PF) 2 MG/ML IV SOLN
1.0000 mg | INTRAVENOUS | Status: DC | PRN
  Administered 2022-06-09: 1 mg via INTRAVENOUS
  Filled 2022-06-08: qty 1

## 2022-06-08 MED ORDER — ALPRAZOLAM 0.25 MG PO TABS
0.2500 mg | ORAL_TABLET | Freq: Every day | ORAL | Status: DC | PRN
  Administered 2022-06-08 – 2022-06-09 (×2): 0.25 mg via ORAL
  Filled 2022-06-08 (×2): qty 1

## 2022-06-08 MED ORDER — CYCLOBENZAPRINE HCL 5 MG PO TABS
5.0000 mg | ORAL_TABLET | Freq: Three times a day (TID) | ORAL | Status: DC | PRN
  Administered 2022-06-08: 5 mg via ORAL
  Filled 2022-06-08: qty 1

## 2022-06-08 MED ORDER — OXYCODONE HCL 5 MG PO TABS
5.0000 mg | ORAL_TABLET | ORAL | Status: DC | PRN
  Administered 2022-06-08 – 2022-06-09 (×3): 5 mg via ORAL
  Filled 2022-06-08 (×3): qty 1

## 2022-06-08 MED ORDER — IOHEXOL 300 MG/ML  SOLN
100.0000 mL | Freq: Once | INTRAMUSCULAR | Status: AC | PRN
  Administered 2022-06-08: 75 mL via INTRAVENOUS

## 2022-06-08 MED ORDER — ONDANSETRON HCL 4 MG PO TABS: 4.0000 mg | ORAL_TABLET | Freq: Four times a day (QID) | ORAL | Status: DC | PRN

## 2022-06-08 MED ORDER — ACETAMINOPHEN 325 MG PO TABS
650.0000 mg | ORAL_TABLET | Freq: Once | ORAL | Status: AC | PRN
  Administered 2022-06-08: 650 mg via ORAL
  Filled 2022-06-08: qty 2

## 2022-06-08 MED ORDER — ONDANSETRON HCL 4 MG/2ML IJ SOLN: 4.0000 mg | Freq: Four times a day (QID) | INTRAMUSCULAR | Status: DC | PRN

## 2022-06-08 MED ORDER — SODIUM CHLORIDE 0.9 % IV BOLUS
1000.0000 mL | Freq: Once | INTRAVENOUS | Status: AC
  Administered 2022-06-08: 1000 mL via INTRAVENOUS

## 2022-06-08 MED ORDER — SODIUM CHLORIDE 0.9 % IV SOLN
3.0000 g | Freq: Three times a day (TID) | INTRAVENOUS | Status: DC
  Administered 2022-06-08: 3 g via INTRAVENOUS

## 2022-06-08 MED ORDER — ACETAMINOPHEN 325 MG PO TABS
650.0000 mg | ORAL_TABLET | Freq: Four times a day (QID) | ORAL | Status: DC | PRN
  Administered 2022-06-09: 650 mg via ORAL
  Filled 2022-06-08: qty 2

## 2022-06-08 MED ORDER — ALBUTEROL SULFATE (2.5 MG/3ML) 0.083% IN NEBU: 3.0000 mL | INHALATION_SOLUTION | Freq: Four times a day (QID) | RESPIRATORY_TRACT | Status: DC | PRN

## 2022-06-08 NOTE — Assessment & Plan Note (Signed)
Statin intolerant No longer taking crestor

## 2022-06-08 NOTE — ED Triage Notes (Addendum)
Pt. Stated, I have an implant that has to come out. This started Sunday morning at 2am . Ive gone to a [facility] and [facility], was given Augmentin and a steroid shot and its still real bad. I woke up this morning and my face , mouth, is in so much pain, my mouth and face is in so much pain. Im suppose to go back to him today at 1100. Some of the implant is out and some is still there.

## 2022-06-08 NOTE — Assessment & Plan Note (Signed)
Well controlled, continue norvasc daily

## 2022-06-08 NOTE — Assessment & Plan Note (Signed)
No signs of exacerbation Continue advair, singulair and SABA prn

## 2022-06-08 NOTE — H&P (Signed)
History and Physical    Patient: [name] DQQ:[id number] DOB: 08/18/1966 DOA: 06/08/2022 DOS: the patient was seen and examined on 06/08/2022 PCP: Rochel Brome, MD  Patient coming from:  [place]  - lives alone.    Chief Complaint: facial swelling.   HPI: [name] is a 56 y.o. female with medical history significant of  HLD, anxiety, COPD, GERD, HTN who presented to ED with complaints of facial swelling. Seen in ED on 9/17 for right facial swelling and pain. She had dental implants removed on 9/5 and was put on PCN. She was seen in UC and given a shot of rocephin/steroids and put on augmentin, but due to pain went to ED. CT at that time showed no abscess. She was given pain medication and sent home. She states that on 9/16 she started to have pain in the right side of her face and teeth. On Sunday morning she woke up and had swelling on the right side of her face. She went to UC as stated above and then the ED. She states after her visit on the 9/17 her face has continued to get worse with swelling/erythema and pain. She has no headache/stridor/vision changes.    Denies any fever/chills, chest pain or palpitations, shortness of breath or cough, abdominal pain, N/V/D, dysuria or leg swelling.   She smokes 1.5 PPD and does not drink alcohol.   ER Course:  vitals: afebrile, bp: 143/75, HR: 92, RR: 16, oxygen: 97%RA Pertinent labs: wbc: 14.2 Ct maxillofacial: Progressive right facial and periorbital inflammatory changes compatible with cellulitis. New from the prior examination of 06/06/2022, there is a superimposed 2.3 cm fluid and gas-containing focus in the region of the right nasolabial fold, compatible with an abscess. In ED: Ent consulted. Started on vanc/zosyn/clinda. Given IVF bolus and TRH asked to admit.     Review of Systems: As mentioned in the history of present illness. All other systems reviewed and are negative. Past Medical History:  Diagnosis Date   Allergic  rhinitis    Arthritis    Blood transfusion without reported diagnosis    COPD (chronic obstructive pulmonary disease) (HCC)    GERD (gastroesophageal reflux disease)    History of colon polyps    HTN (hypertension)    Hyperlipidemia    Kidney stones    Pancreatitis    Pneumonia    Primary hypertension 10/20/2021   Psychotic depression ([place])    Shingles February 26th, 2001   Past Surgical History:  Procedure Laterality Date   ABDOMINAL HYSTERECTOMY     CESAREAN SECTION     COLONOSCOPY  05/02/2017   Colonic polyp status post polypectomy. Small internal hemorrhoids   GALLBLADDER SURGERY     REPLACEMENT TOTAL KNEE Right 02/09/2019   TOTAL KNEE ARTHROPLASTY  02/09/2019   TUBAL LIGATION     Social History:  reports that she has been smoking cigarettes. She has a 20.00 pack-year smoking history. She has never used smokeless tobacco. She reports current alcohol use. She reports that she does not use drugs.  Allergies  Allergen Reactions   Levofloxacin Other (See Comments) and Rash    Rash all over redness    Bactrim Ds [Sulfamethoxazole-Trimethoprim] Nausea Only   Levaquin [Levofloxacin In D5w]    Lipitor [Atorvastatin] Other (See Comments)    Myalgia    Tape Hives   Zetia [Ezetimibe]     Muscle pain   Codeine Nausea Only   Other Rash    bandaids leave a red area  if left on too long    Family History  Problem Relation Age of Onset   COPD Mother    Parkinson's disease Mother    COPD Father    Arthritis Father    Lung disease Father    Colon cancer Cousin        mother's cousin   Breast cancer Maternal Aunt        great aunt   Diabetes Other    Stroke Other    Hypertension Other    Hyperlipidemia Other    Asthma Other    Heart failure Other    Thyroid disease Other    Heart attack Other    COPD Other    Arrhythmia Other    Arthritis Other    Migraines Other     Prior to Admission medications   Medication Sig Start Date End Date Taking? Authorizing Provider   albuterol (VENTOLIN HFA) 108 (90 Base) MCG/ACT inhaler Inhale 2 puffs into the lungs every 6 (six) hours as needed.   Yes [provider]  ALPRAZolam (XANAX) 0.25 MG tablet TAKE 1 TABLET(0.25 MG) BY MOUTH DAILY AS NEEDED FOR ANXIETY 05/06/22  Yes Cox, Kirsten, MD  amLODipine (NORVASC) 10 MG tablet TAKE 1 TABLET(10 MG) BY MOUTH DAILY Strength: 10 mg 10/20/21  Yes Cox, Kirsten, MD  amoxicillin-clavulanate (AUGMENTIN) 875-125 MG tablet Take 1 tablet by mouth 2 (two) times daily. 06/06/22  Yes [provider]  chlorhexidine (PERIDEX) 0.12 % solution SMARTSIG:By Mouth 05/23/22  Yes [provider]  cyclobenzaprine (FLEXERIL) 10 MG tablet Take 5 mg by mouth 3 (three) times daily as needed for muscle spasms.   Yes [provider]  dicyclomine (BENTYL) 10 MG capsule TAKE 1 CAPSULE(10 MG) BY MOUTH FOUR TIMES DAILY BEFORE MEALS AND AT BEDTIME 10/02/21  Yes Cox, Kirsten, MD  famotidine (PEPCID) 40 MG tablet Take 1 tablet (40 mg total) by mouth daily. 01/22/22  Yes Cox, Kirsten, MD  fluticasone [facility]) 50 MCG/ACT nasal spray SHAKE LIQUID AND USE 2 SPRAYS IN EACH NOSTRIL DAILY 03/02/22  Yes Rip Harbour, NP  fluticasone-salmeterol (ADVAIR HFA) 115-21 MCG/ACT inhaler Inhale 2 puffs into the lungs 2 (two) times daily. 03/02/22  Yes Cox, Kirsten, MD  furosemide (LASIX) 20 MG tablet Take 1 tablet (20 mg total) by mouth daily as needed for edema. 03/25/22  Yes Cox, Kirsten, MD  gabapentin (NEURONTIN) 300 MG capsule Take 1 capsule (300 mg total) by mouth 3 (three) times daily. 04/19/22  Yes Cox, Kirsten, MD  HYDROcodone-acetaminophen (NORCO/VICODIN) 5-325 MG tablet Take 1 tablet by mouth every 6 (six) hours as needed for severe pain or moderate pain. 06/06/22  Yes Caccavale, Sophia, PA-C  montelukast (SINGULAIR) 10 MG tablet TAKE 1 TABLET(10 MG) BY MOUTH DAILY 10/26/21  Yes Cox, Kirsten, MD  OZEMPIC, 2 MG/DOSE, 8 MG/3ML SOPN INJECT 2 MG AS DIRECTED ONCE A WEEK 04/12/22  Yes Cox, Kirsten, MD   triamcinolone (KENALOG) 0.025 % ointment Apply 1 application. topically 2 (two) times daily. Apply to corners of mouth twice daily 12/01/21  Yes Rip Harbour, NP  vitamin E 180 MG (400 UNITS) capsule Take by mouth.   Yes [provider]  vitamin k 100 MCG tablet Take 100 mcg by mouth daily.   Yes [provider]  Evolocumab (REPATHA) 140 MG/ML SOSY Inject 140 mg into the skin every 14 (fourteen) days. Patient not taking: Reported on 06/08/2022 05/11/22   CoxElnita Maxwell, MD  methocarbamol (ROBAXIN) 500 MG tablet Take 1 tablet (  500 mg total) by mouth 4 (four) times daily. Patient not taking: Reported on 06/08/2022 05/06/22   Cox, Elnita Maxwell, MD  nitroGLYCERIN (NITROSTAT) 0.4 MG SL tablet DISSOLVE 1 TABLET BY MOUTH UNDER THE TONGUE AS NEEDED FOR CHEST PAIN EVERY 5 MINUTES UP TO 3 TIMES 12/23/20   Marge Duncans, PA-C  rosuvastatin (CRESTOR) 5 MG tablet Take 1 tablet (5 mg total) by mouth daily. Patient not taking: Reported on 06/08/2022 05/17/22   Rochel Brome, MD    Physical Exam: Vitals:   06/08/22 1316 06/08/22 1441 06/08/22 1515 06/08/22 1900  BP: 125/83  131/68 122/76  Pulse: 100  (!) 104 99  Resp: '16  18 15  '$ Temp:  99.6 F (37.6 C)  (!) 100.4 F (38 C)  TempSrc:  Oral  Oral  SpO2: 94%  93% 94%  Weight:      Height:       General:  Appears calm and comfortable and is in NAD Eyes:  PERRL, EOMI, normal lids, iris. Right sided facial cellulitis with extensive edema around right orbit. Induration of right nasolabial fold. Erythema and edema down into right side of neck.  ENT:  grossly normal hearing, lips & tongue, mmm; edentulism of upper teeth. Implant in upper left mandible and broken in right mandible. Implant removed. No erythema/drainage  Neck:  no LAD, masses or thyromegaly; no carotid bruits. Edema/erythema of right side of neck  Cardiovascular:  RRR, no m/r/g. No LE edema.  Respiratory:   CTA bilaterally with no wheezes/rales/rhonchi.  Normal respiratory  effort. Abdomen:  soft, NT, ND, NABS Back:   normal alignment, no CVAT Skin:  no rash or induration seen on limited exam Musculoskeletal:  grossly normal tone BUE/BLE, good ROM, no bony abnormality Lower extremity:  No LE edema.  Limited foot exam with no ulcerations.  2+ distal pulses. Psychiatric:  grossly normal mood and affect, speech fluent and appropriate, AOx3 Neurologic:  CN 2-12 grossly intact, moves all extremities in coordinated fashion, sensation intact   Radiological Exams on Admission: Independently reviewed - see discussion in A/P where applicable  CT Maxillofacial W Contrast  Result Date: 06/08/2022 CLINICAL DATA:  Provided history: Facial cellulitis. Additional history provided: Dental pain and swelling. Right facial swelling which has increased since being seen in the emergency department 2 days ago. EXAM: CT MAXILLOFACIAL WITH CONTRAST TECHNIQUE: Multidetector CT imaging of the maxillofacial structures was performed with intravenous contrast. Multiplanar CT image reconstructions were also generated. RADIATION DOSE REDUCTION: This exam was performed according to the departmental dose-optimization program which includes automated exposure control, adjustment of the mA and/or kV according to patient size and/or use of iterative reconstruction technique. CONTRAST:  74m OMNIPAQUE IOHEXOL 300 MG/ML  SOLN COMPARISON:  Maxillofacial CT 06/06/2022. FINDINGS: Osseous: Streak and beam hardening artifact arising from dental restoration obscures portions of the oral cavity and perioral soft tissues. Poor dentition. All of the patient's native maxillary teeth are absent. Multiple mandibular teeth are also absent. Multiple dental implants within the maxilla and mandible. Orbits: No orbital mass or acute orbital finding. Sinuses: Mild mucosal thickening within the bilateral maxillary and ethmoid sinuses. Soft tissues: Progressive right facial and periorbital soft tissue swelling and edema,  greatest in the region of the right nasal labial fold. New from the prior exam, there is a 1.2 x 1.6 x 2.3 cm fluid and gas-containing focus in the region of the right nasal labial fold compatible with an abscess (for instance as seen on series 2, image 43). Limited intracranial: No evidence  of acute intracranial mallet. IMPRESSION: Progressive right facial and periorbital inflammatory changes compatible with cellulitis. New from the prior examination of 06/06/2022, there is a superimposed 2.3 cm fluid and gas-containing focus in the region of the right nasolabial fold, compatible with an abscess. Paranasal sinus disease, as described. Electronically Signed   By: Kellie Simmering D.O.   On: 06/08/2022 12:40     Labs on Admission: I have personally reviewed the available labs and imaging studies at the time of the admission.  Pertinent labs:   Wbc: 14.2   Assessment and Plan: Principal Problem:   Facial cellulitis/orbital cellulitis  Active Problems:   Mixed hyperlipidemia   GAD (generalized anxiety disorder)   Prediabetes   Moderate persistent asthma without complication/COPD   Primary hypertension   GERD (gastroesophageal reflux disease)    Assessment and Plan: * Facial cellulitis/orbital cellulitis  56 year old female with 4 day history of worsening right sided facial swelling and pain found to have progressive right facial and periorbital inflammatory changes compatible with cellulitis. Also has a superimposed 2.3cm fluid and gas containing focus in the region of the right nasolabial fold, compatible with abcess -admit to telemetry -started on vanc/zosyn and given clinda in ED-continue vanc/zosyn -had implant removed on 9/5 on right side, may need oral-maxillary consult or transfer out if determine this is due to dental issues  -ENT consulted and will see patient  -pain control with oxycodone and morphine for severe pain    GERD (gastroesophageal reflux disease) Continue pepcid daily    Primary hypertension Well controlled, continue norvasc daily  Moderate persistent asthma without complication/COPD No signs of exacerbation Continue advair, singulair and SABA prn   Prediabetes A1C in may 2023 was 5.3 Hold ozempic while inpatient Carb modified diet   GAD (generalized anxiety disorder) Continue xanax prn   Mixed hyperlipidemia Statin intolerant No longer taking crestor      Advance Care Planning:   Code Status: Full Code   Consults: ENT: Dr. Janace Hoard  DVT Prophylaxis: SCDs  Family Communication: mother and sister at bedside   Severity of Illness: The appropriate patient status for this patient is INPATIENT. Inpatient status is judged to be reasonable and necessary in order to provide the required intensity of service to ensure the patient's safety. The patient's presenting symptoms, physical exam findings, and initial radiographic and laboratory data in the context of their chronic comorbidities is felt to place them at high risk for further clinical deterioration. Furthermore, it is not anticipated that the patient will be medically stable for discharge from the hospital within 2 midnights of admission.   * I certify that at the point of admission it is my clinical judgment that the patient will require inpatient hospital care spanning beyond 2 midnights from the point of admission due to high intensity of service, high risk for further deterioration and high frequency of surveillance required.*  Author: Orma Flaming, MD 06/08/2022 7:44 PM  For on call review [email or web address].

## 2022-06-08 NOTE — Assessment & Plan Note (Addendum)
56 year old female with 4 day history of worsening right sided facial swelling and pain found to have progressive right facial and periorbital inflammatory changes compatible with cellulitis. Also has a superimposed 2.3cm fluid and gas containing focus in the region of the right nasolabial fold, compatible with abcess -admit to telemetry -started on vanc/zosyn and given clinda in ED-continue vanc/zosyn -had implant removed on 9/5 on right side, may need oral-maxillary consult or transfer out if determine this is due to dental issues  -ENT consulted and will see patient  -pain control with oxycodone and morphine for severe pain

## 2022-06-08 NOTE — Progress Notes (Signed)
Pharmacy Antibiotic Note  [name] is a 56 y.o. female admitted on 06/08/2022 with facial cellulitis.  Pharmacy has been consulted for vancomycin and zosyn dosing. Vancomycin '1500mg'$  loading dose received in [place] ED.   Plan: Continue zosyn 3.375g IV q8h (extended infusion)  Start vancomycin '1000mg'$  q12h (eAUC 554 using Scr 0.8 and Vd 0.72) Obtain vancomycin levels as appropriate  Monitor renal function, cultures, and clinical progression    Height: '5\' 7"'$  (170.2 cm) Weight: 73.9 kg (162 lb 14.7 oz) IBW/kg (Calculated) : 61.6  Temp (24hrs), Avg:98.8 F (37.1 C), Min:98.3 F (36.8 C), Max:99.6 F (37.6 C)  Recent Labs  Lab 06/06/22 1707 06/06/22 1844 06/08/22 1137  WBC 12.4*  --  14.2*  CREATININE 0.62  --  0.45  LATICACIDVEN  --  1.1  --     Estimated Creatinine Clearance: 76.4 mL/min (by C-G formula based on SCr of 0.45 mg/dL).    Allergies  Allergen Reactions   Levofloxacin Other (See Comments) and Rash    Rash all over redness    Bactrim Ds [Sulfamethoxazole-Trimethoprim] Nausea Only   Levaquin [Levofloxacin In D5w]    Lipitor [Atorvastatin] Other (See Comments)    Myalgia    Tape Hives   Zetia [Ezetimibe]     Muscle pain   Codeine Nausea Only   Other Rash    bandaids leave a red area if left on too long    Antimicrobials this admission: Vancomycin 9/19 >>  Zosyn 9/19 >>   Dose adjustments this admission:   Microbiology results:   Thank you for allowing pharmacy to be a part of this patient's care.  Cristela Felt, PharmD, BCPS Clinical Pharmacist 06/08/2022 6:31 PM

## 2022-06-08 NOTE — ED Provider Notes (Signed)
[facility]ERG[facility] DEPT Provider Note   CSN: [id number] Arrival date & time: 06/08/22  0957     History  Chief Complaint  Patient presents with   Facial Swelling    [name] is a 56 y.o. female.  Patient is here with worsening right-sided facial swelling.  Swelling now involving the eye area.  Has been on antibiotics for several days with no improvement.  History of high cholesterol, hypertension, COPD.  Nothing makes it worse or better.  Denies any vision loss.  Denies any difficulty opening closing mouth.  Swelling started around the right upper jawline and went up around the nose now up around the eye.  Had a CT scan several days ago here in the ED as well.  Has been on antibiotics without much improvement.  The history is provided by the patient.       Home Medications Prior to Admission medications   Medication Sig Start Date End Date Taking? Authorizing Provider  albuterol (VENTOLIN HFA) 108 (90 Base) MCG/ACT inhaler Inhale 2 puffs into the lungs every 6 (six) hours as needed.   Yes [provider]  ALPRAZolam (XANAX) 0.25 MG tablet TAKE 1 TABLET(0.25 MG) BY MOUTH DAILY AS NEEDED FOR ANXIETY 05/06/22  Yes Cox, Kirsten, MD  amLODipine (NORVASC) 10 MG tablet TAKE 1 TABLET(10 MG) BY MOUTH DAILY Strength: 10 mg 10/20/21  Yes Cox, Kirsten, MD  amoxicillin-clavulanate (AUGMENTIN) 875-125 MG tablet Take 1 tablet by mouth 2 (two) times daily. 06/06/22  Yes [provider]  chlorhexidine (PERIDEX) 0.12 % solution SMARTSIG:By Mouth 05/23/22  Yes [provider]  cyclobenzaprine (FLEXERIL) 10 MG tablet Take 5 mg by mouth 3 (three) times daily as needed for muscle spasms.   Yes [provider]  dicyclomine (BENTYL) 10 MG capsule TAKE 1 CAPSULE(10 MG) BY MOUTH FOUR TIMES DAILY BEFORE MEALS AND AT BEDTIME 10/02/21  Yes Cox, Kirsten, MD  famotidine (PEPCID) 40 MG tablet Take 1 tablet (40 mg total) by mouth daily. 01/22/22  Yes Cox, Kirsten,  MD  fluticasone [facility]) 50 MCG/ACT nasal spray SHAKE LIQUID AND USE 2 SPRAYS IN EACH NOSTRIL DAILY 03/02/22  Yes Rip Harbour, NP  fluticasone-salmeterol (ADVAIR HFA) 115-21 MCG/ACT inhaler Inhale 2 puffs into the lungs 2 (two) times daily. 03/02/22  Yes Cox, Kirsten, MD  furosemide (LASIX) 20 MG tablet Take 1 tablet (20 mg total) by mouth daily as needed for edema. 03/25/22  Yes Cox, Kirsten, MD  gabapentin (NEURONTIN) 300 MG capsule Take 1 capsule (300 mg total) by mouth 3 (three) times daily. 04/19/22  Yes Cox, Kirsten, MD  HYDROcodone-acetaminophen (NORCO/VICODIN) 5-325 MG tablet Take 1 tablet by mouth every 6 (six) hours as needed for severe pain or moderate pain. 06/06/22  Yes Caccavale, Sophia, PA-C  montelukast (SINGULAIR) 10 MG tablet TAKE 1 TABLET(10 MG) BY MOUTH DAILY 10/26/21  Yes Cox, Kirsten, MD  OZEMPIC, 2 MG/DOSE, 8 MG/3ML SOPN INJECT 2 MG AS DIRECTED ONCE A WEEK 04/12/22  Yes Cox, Kirsten, MD  triamcinolone (KENALOG) 0.025 % ointment Apply 1 application. topically 2 (two) times daily. Apply to corners of mouth twice daily 12/01/21  Yes Rip Harbour, NP  vitamin E 180 MG (400 UNITS) capsule Take by mouth.   Yes [provider]  vitamin k 100 MCG tablet Take 100 mcg by mouth daily.   Yes [provider]  Evolocumab (REPATHA) 140 MG/ML SOSY Inject 140 mg into the skin every 14 (fourteen) days. Patient not taking: Reported on 06/08/2022  05/11/22   Cox, Elnita Maxwell, MD  methocarbamol (ROBAXIN) 500 MG tablet Take 1 tablet (500 mg total) by mouth 4 (four) times daily. Patient not taking: Reported on 06/08/2022 05/06/22   Cox, Elnita Maxwell, MD  nitroGLYCERIN (NITROSTAT) 0.4 MG SL tablet DISSOLVE 1 TABLET BY MOUTH UNDER THE TONGUE AS NEEDED FOR CHEST PAIN EVERY 5 MINUTES UP TO 3 TIMES 12/23/20   Marge Duncans, PA-C  rosuvastatin (CRESTOR) 5 MG tablet Take 1 tablet (5 mg total) by mouth daily. Patient not taking: Reported on 06/08/2022 05/17/22   CoxElnita Maxwell, MD      Allergies     Levofloxacin, Bactrim ds [sulfamethoxazole-trimethoprim], Levaquin [levofloxacin in d5w], Lipitor [atorvastatin], Tape, Zetia [ezetimibe], Codeine, and Other    Review of Systems   Review of Systems  Physical Exam Updated Vital Signs BP 137/78 (BP Location: Left Arm)   Pulse (!) 107   Temp 98.3 F (36.8 C) (Oral)   Resp 18   Ht '5\' 7"'$  (1.702 m)   Wt 73.9 kg   LMP  (LMP Unknown) Comment: hysterectomy 2008  SpO2 100%   BMI 25.52 kg/m  Physical Exam Vitals and nursing note reviewed.  Constitutional:      General: She is not in acute distress.    Appearance: She is well-developed.  HENT:     Head: Normocephalic and atraumatic.  Eyes:     Conjunctiva/sclera: Conjunctivae normal.     Comments: Extraocular movements are intact bilaterally  Cardiovascular:     Rate and Rhythm: Normal rate and regular rhythm.     Pulses: Normal pulses.     Heart sounds: Normal heart sounds. No murmur heard. Pulmonary:     Effort: Pulmonary effort is normal. No respiratory distress.     Breath sounds: Normal breath sounds.  Abdominal:     Palpations: Abdomen is soft.     Tenderness: There is no abdominal tenderness.  Musculoskeletal:        General: No swelling.     Cervical back: Neck supple.  Skin:    General: Skin is warm and dry.     Capillary Refill: Capillary refill takes less than 2 seconds.     Comments: Cellulitic changes around the right cheekbone involving the nasolabial fold on the right as well as now around the right eye  Neurological:     Mental Status: She is alert.  Psychiatric:        Mood and Affect: Mood normal.     ED Results / Procedures / Treatments   Labs (all labs ordered are listed, but only abnormal results are displayed) Labs Reviewed  CBC WITH DIFFERENTIAL/PLATELET - Abnormal; Notable for the following components:      Result Value   WBC 14.2 (*)    Neutro Abs 10.9 (*)    All other components within normal limits  BASIC METABOLIC PANEL - Abnormal;  Notable for the following components:   Glucose, Bld 108 (*)    All other components within normal limits    EKG None  Radiology CT Maxillofacial W Contrast  Result Date: 06/08/2022 CLINICAL DATA:  Provided history: Facial cellulitis. Additional history provided: Dental pain and swelling. Right facial swelling which has increased since being seen in the emergency department 2 days ago. EXAM: CT MAXILLOFACIAL WITH CONTRAST TECHNIQUE: Multidetector CT imaging of the maxillofacial structures was performed with intravenous contrast. Multiplanar CT image reconstructions were also generated. RADIATION DOSE REDUCTION: This exam was performed according to the departmental dose-optimization program which includes automated exposure control, adjustment  of the mA and/or kV according to patient size and/or use of iterative reconstruction technique. CONTRAST:  9m OMNIPAQUE IOHEXOL 300 MG/ML  SOLN COMPARISON:  Maxillofacial CT 06/06/2022. FINDINGS: Osseous: Streak and beam hardening artifact arising from dental restoration obscures portions of the oral cavity and perioral soft tissues. Poor dentition. All of the patient's native maxillary teeth are absent. Multiple mandibular teeth are also absent. Multiple dental implants within the maxilla and mandible. Orbits: No orbital mass or acute orbital finding. Sinuses: Mild mucosal thickening within the bilateral maxillary and ethmoid sinuses. Soft tissues: Progressive right facial and periorbital soft tissue swelling and edema, greatest in the region of the right nasal labial fold. New from the prior exam, there is a 1.2 x 1.6 x 2.3 cm fluid and gas-containing focus in the region of the right nasal labial fold compatible with an abscess (for instance as seen on series 2, image 43). Limited intracranial: No evidence of acute intracranial mallet. IMPRESSION: Progressive right facial and periorbital inflammatory changes compatible with cellulitis. New from the prior  examination of 06/06/2022, there is a superimposed 2.3 cm fluid and gas-containing focus in the region of the right nasolabial fold, compatible with an abscess. Paranasal sinus disease, as described. Electronically Signed   By: KKellie SimmeringD.O.   On: 06/08/2022 12:40   CT Maxillofacial W Contrast  Result Date: 06/06/2022 CLINICAL DATA:  Right facial pain EXAM: CT MAXILLOFACIAL WITH CONTRAST TECHNIQUE: Multidetector CT imaging of the maxillofacial structures was performed with intravenous contrast. Multiplanar CT image reconstructions were also generated. RADIATION DOSE REDUCTION: This exam was performed according to the departmental dose-optimization program which includes automated exposure control, adjustment of the mA and/or kV according to patient size and/or use of iterative reconstruction technique. CONTRAST:  737mOMNIPAQUE IOHEXOL 300 MG/ML  SOLN COMPARISON:  None Available. FINDINGS: Osseous: Areas of the oral cavity and adjacent soft tissues are obscured by streak artifacts from metallic dental hardware. There is inflammatory change in the soft tissues overlying the upper right maxillary teeth, greatest at the right nasolabial fold. There is no discrete abscess. Orbits: Negative. No traumatic or inflammatory finding. Sinuses: Clear. Soft tissues: Negative. Limited intracranial: No significant or unexpected finding. IMPRESSION: Inflammatory change in the soft tissues overlying the upper right maxillary teeth, greatest at the right nasolabial fold. No discrete abscess. Electronically Signed   By: KeUlyses Jarred.D.   On: 06/06/2022 19:41   DG Chest 2 View  Result Date: 06/06/2022 CLINICAL DATA:  Dental infection. EXAM: CHEST - 2 VIEW COMPARISON:  05/20/2020 FINDINGS: Heart size appears normal. No pleural effusion or edema. No pleural effusion or scratch set no airspace opacities identified. Scarring noted within the right middle lobe. Visualized osseous structures are unremarkable. IMPRESSION: No  active cardiopulmonary abnormalities. Electronically Signed   By: TaKerby Moors.D.   On: 06/06/2022 17:41    Procedures Procedures    Medications Ordered in ED Medications  Ampicillin-Sulbactam (UNASYN) 3 g in sodium chloride 0.9 % 100 mL IVPB (3 g Intravenous New Bag/Given 06/08/22 1141)  fentaNYL (SUBLIMAZE) injection 50 mcg (has no administration in time range)  sodium chloride 0.9 % bolus 1,000 mL (1,000 mLs Intravenous New Bag/Given 06/08/22 1140)  iohexol (OMNIPAQUE) 300 MG/ML solution 100 mL (75 mLs Intravenous Contrast Given 06/08/22 1148)  fentaNYL (SUBLIMAZE) injection 50 mcg (50 mcg Intravenous Given 06/08/22 1205)    ED Course/ Medical Decision Making/ A&P  Medical Decision Making Amount and/or Complexity of Data Reviewed Labs: ordered. Radiology: ordered.  Risk Prescription drug management. Decision regarding hospitalization.   [name] is here with right-sided facial swelling.  Unremarkable vitals.  Overall patient appears to have cellulitis on the right side of the face.  This appears to be worsening.  She has been on antibiotics without much improvement.  She had a CT scan 2 days ago that showed cellulitic changes but no obvious abscess.  Appear to be from dental process.  Infection now appears to be involving the preseptal area around the eye.  We will get a new CT scan to see if there is any abscess formation but overall suspect distal worsening cellulitis likely stemming from a dental process.  Talked with pharmacy and will start IV Unasyn and collect basic labs.  She is not febrile.  She has no diabetes.  Anticipate admission for observation and IV antibiotics.  Per my review and interpretation of labs patient with leukocytosis of 14.  Otherwise no significant anemia or electrolyte abnormality.  Radiology called me on the phone and states progressive right facial and periorbital inflammatory changes compatible with cellulitis.  May be  now a small fluid collection around the nasolabial fold possibly compatible with an abscess.  Dr. Janace Hoard of ENT recommends admission to [facility], IV antibiotics.  He will consult when patient arrives.  Will admit to medicine for further care.  This chart was dictated using voice recognition software.  Despite best efforts to proofread,  errors can occur which can change the documentation meaning.         Final Clinical Impression(s) / ED Diagnoses Final diagnoses:  Facial cellulitis    Rx / DC Orders ED Discharge Orders     None         Lennice Sites, DO 06/08/22 1301

## 2022-06-08 NOTE — Assessment & Plan Note (Signed)
A1C in may 2023 was 5.3 Hold ozempic while inpatient Carb modified diet

## 2022-06-08 NOTE — Progress Notes (Addendum)
Plan of Care Note for accepted transfer   Patient: [name] MRN: [id number]   [place]: 06/08/2022  Facility requesting transfer: [facility] Requesting Provider: Dr. Ronnald Nian Reason for transfer: facial cellulitis Facility course: 56 yo F w/ PMHx of HLD, anxiety, COPD. Presenting with facial cellulitis. CT showed progressive right facial and periorbital inflammatory changes compatible with cellulitis and abscess. Started on vanc, zosyn. EDP spoke with ENT (Dr. Janace Hoard). Requested admission to [facility] for evaluation.   Plan of care: The patient is accepted for admission to Telemetry unit, at [facility]. While holding at [facility], medical decision making responsibilities remain with the [facility]. Upon arrival to [facility], [facility] will assume care. Thank you.  Author: Jonnie Finner, DO 06/08/2022  Check [email or web address] for on-call coverage.  Nursing staff, Please call [place] number on Amion as soon as patient's arrival, so appropriate admitting provider can evaluate the pt.

## 2022-06-08 NOTE — ED Notes (Signed)
Pt ambulatory without assistance.  

## 2022-06-08 NOTE — ED Triage Notes (Signed)
Pt states she was here the other day for dental pain and swelling. Came back today feels tight and hurts. Pain 7/10, denies fever chills, but taking antibiotics.

## 2022-06-08 NOTE — Assessment & Plan Note (Signed)
Continue xanax prn.   

## 2022-06-08 NOTE — Assessment & Plan Note (Signed)
Continue pepcid daily

## 2022-06-08 NOTE — ED Notes (Signed)
Patient left without being seen, states she is going to [facility] for care

## 2022-06-08 NOTE — ED Notes (Signed)
Patient transported to CT 

## 2022-06-08 NOTE — Assessment & Plan Note (Signed)
>>  ASSESSMENT AND PLAN FOR MODERATE PERSISTENT ASTHMA WITHOUT COMPLICATION/COPD WRITTEN ON 06/08/2022  6:34 PM BY WOLFE, ALLISON, MD  No signs of exacerbation Continue advair, singulair and SABA prn

## 2022-06-08 NOTE — Consult Note (Signed)
Reason for Consult:dental abscess Referring Physician: Dr Mauro Kaufmann is an 56 y.o. female.  HPI: With a history of bad dentition and dental implants.  Recently had a dental implant removed on 9 /5.  She contacted Dr. Louanne Skye oral surgery in [facility] about her problem of swelling and pain.  He saw her yesterday at the office and told her he could not do anything that day and to come back today.  She then called the office today and he told her to go to an urgent care.  Not exactly sure why this was removed but patient started having pain increasing.  Was seen on 917 given antibiotics and was seen in the emergency room.  She now is having worsening pain and swelling in the face.  She was seen at [facility] and they called me.  I told them if this is a dental problem she needs to be transferred as we do not have oral surgery on-call.  Apparently she was transferred anyway.  Past Medical History:  Diagnosis Date   Allergic rhinitis    Arthritis    Blood transfusion without reported diagnosis    COPD (chronic obstructive pulmonary disease) (HCC)    GERD (gastroesophageal reflux disease)    History of colon polyps    HTN (hypertension)    Hyperlipidemia    Kidney stones    Pancreatitis    Pneumonia    Primary hypertension 10/20/2021   Psychotic depression ([place])    Shingles February 26th, 2001    Past Surgical History:  Procedure Laterality Date   ABDOMINAL HYSTERECTOMY     CESAREAN SECTION     COLONOSCOPY  05/02/2017   Colonic polyp status post polypectomy. Small internal hemorrhoids   GALLBLADDER SURGERY     REPLACEMENT TOTAL KNEE Right 02/09/2019   TOTAL KNEE ARTHROPLASTY  02/09/2019   TUBAL LIGATION      Family History  Problem Relation Age of Onset   COPD Mother    Parkinson's disease Mother    COPD Father    Arthritis Father    Lung disease Father    Colon cancer Cousin        mother's cousin   Breast cancer Maternal Aunt        great aunt   Diabetes Other     Stroke Other    Hypertension Other    Hyperlipidemia Other    Asthma Other    Heart failure Other    Thyroid disease Other    Heart attack Other    COPD Other    Arrhythmia Other    Arthritis Other    Migraines Other     Social History:  reports that she has been smoking cigarettes. She has a 20.00 pack-year smoking history. She has never used smokeless tobacco. She reports current alcohol use. She reports that she does not use drugs.  Allergies:  Allergies  Allergen Reactions   Levofloxacin Other (See Comments) and Rash    Rash all over redness    Bactrim Ds [Sulfamethoxazole-Trimethoprim] Nausea Only   Levaquin [Levofloxacin In D5w]    Lipitor [Atorvastatin] Other (See Comments)    Myalgia    Tape Hives   Zetia [Ezetimibe]     Muscle pain   Codeine Nausea Only   Other Rash    bandaids leave a red area if left on too long    Medications: I have reviewed the patient's current medications.  Results for orders placed or performed during the hospital encounter  of 06/08/22 (from the past 48 hour(s))  CBC with Differential     Status: Abnormal   Collection Time: 06/08/22 11:37 AM  Result Value Ref Range   WBC 14.2 (H) 4.0 - 10.5 K/uL   RBC 4.35 3.87 - 5.11 MIL/uL   Hemoglobin 14.3 12.0 - 15.0 g/dL   HCT 41.8 36.0 - 46.0 %   MCV 96.1 80.0 - 100.0 fL   MCH 32.9 26.0 - 34.0 pg   MCHC 34.2 30.0 - 36.0 g/dL   RDW 12.3 11.5 - 15.5 %   Platelets 288 150 - 400 K/uL   nRBC 0.0 0.0 - 0.2 %   Neutrophils Relative % 77 %   Neutro Abs 10.9 (H) 1.7 - 7.7 K/uL   Lymphocytes Relative 15 %   Lymphs Abs 2.2 0.7 - 4.0 K/uL   Monocytes Relative 7 %   Monocytes Absolute 0.9 0.1 - 1.0 K/uL   Eosinophils Relative 1 %   Eosinophils Absolute 0.2 0.0 - 0.5 K/uL   Basophils Relative 0 %   Basophils Absolute 0.0 0.0 - 0.1 K/uL   Immature Granulocytes 0 %   Abs Immature Granulocytes 0.06 0.00 - 0.07 K/uL    Comment: Performed at [facility], [address],  [place], [place] [address]  Basic metabolic panel     Status: Abnormal   Collection Time: 06/08/22 11:37 AM  Result Value Ref Range   Sodium 137 135 - 145 mmol/L   Potassium 3.5 3.5 - 5.1 mmol/L   Chloride 100 98 - 111 mmol/L   CO2 26 22 - 32 mmol/L   Glucose, Bld 108 (H) 70 - 99 mg/dL    Comment: Glucose reference range applies only to samples taken after fasting for at least 8 hours.   BUN 6 6 - 20 mg/dL   Creatinine, Ser 0.45 0.44 - 1.00 mg/dL   Calcium 9.5 8.9 - 10.3 mg/dL   GFR, Estimated >60 >60 mL/min    Comment: (NOTE) Calculated using the CKD-EPI Creatinine Equation (2021)    Anion gap 11 5 - 15    Comment: Performed at [facility], [address], [place], [place] [address]    CT Maxillofacial W Contrast  Result Date: 06/08/2022 CLINICAL DATA:  Provided history: Facial cellulitis. Additional history provided: Dental pain and swelling. Right facial swelling which has increased since being seen in the emergency department 2 days ago. EXAM: CT MAXILLOFACIAL WITH CONTRAST TECHNIQUE: Multidetector CT imaging of the maxillofacial structures was performed with intravenous contrast. Multiplanar CT image reconstructions were also generated. RADIATION DOSE REDUCTION: This exam was performed according to the departmental dose-optimization program which includes automated exposure control, adjustment of the mA and/or kV according to patient size and/or use of iterative reconstruction technique. CONTRAST:  52m OMNIPAQUE IOHEXOL 300 MG/ML  SOLN COMPARISON:  Maxillofacial CT 06/06/2022. FINDINGS: Osseous: Streak and beam hardening artifact arising from dental restoration obscures portions of the oral cavity and perioral soft tissues. Poor dentition. All of the patient's native maxillary teeth are absent. Multiple mandibular teeth are also absent. Multiple dental implants within the maxilla and mandible. Orbits: No orbital mass or acute orbital finding. Sinuses: Mild mucosal  thickening within the bilateral maxillary and ethmoid sinuses. Soft tissues: Progressive right facial and periorbital soft tissue swelling and edema, greatest in the region of the right nasal labial fold. New from the prior exam, there is a 1.2 x 1.6 x 2.3 cm fluid and gas-containing focus in the region of the right nasal labial  fold compatible with an abscess (for instance as seen on series 2, image 43). Limited intracranial: No evidence of acute intracranial mallet. IMPRESSION: Progressive right facial and periorbital inflammatory changes compatible with cellulitis. New from the prior examination of 06/06/2022, there is a superimposed 2.3 cm fluid and gas-containing focus in the region of the right nasolabial fold, compatible with an abscess. Paranasal sinus disease, as described. Electronically Signed   By: Kellie Simmering D.O.   On: 06/08/2022 12:40    ROS Blood pressure 122/76, pulse 99, temperature (!) 100.4 F (38 C), temperature source Oral, resp. rate 15, height '5\' 7"'$  (1.702 m), weight 73.9 kg, SpO2 94 %. Physical Exam HENT:     Head: Normocephalic.     Comments: She has erythema over the right side of the face and some swelling around the nasolabial fold on the right.  The eye is still without significant swelling to the point of any closure.  The eye itself looks normal.    Mouth/Throat:     Comments: There is a wound and swelling of the gingiva on the right side maxillary region.  The tongue is without significant swelling as well as the soft palate or oropharynx. Eyes:     Extraocular Movements: Extraocular movements intact.     Conjunctiva/sclera: Conjunctivae normal.     Pupils: Pupils are equal, round, and reactive to light.  Neurological:     Mental Status: She is alert.       Assessment/Plan: Dental abscess-this clearly is a dental source from her previous extraction and it looks like one of the areas of the implant is still involved just inferior to the abscess area.  Dr.  Louanne Skye is her oral surgeon in [facility] who performed the surgery and saw her 2 times since.  He saw her yesterday and could not do what he planned to do so told her to come back today.  I think the patient should be transferred to his care as [facility] has a higher level of care with oral surgery since we have no oral surgeons on staff or call right now.   I would recommend transfer as I cannot take care of a complicated situation with dental implants involved. Melissa Montane 06/08/2022, 8:01 PM

## 2022-06-09 ENCOUNTER — Encounter (HOSPITAL_COMMUNITY): Payer: Self-pay | Admitting: Critical Care Medicine

## 2022-06-09 ENCOUNTER — Other Ambulatory Visit (HOSPITAL_COMMUNITY): Payer: Self-pay

## 2022-06-09 ENCOUNTER — Encounter (HOSPITAL_COMMUNITY)
Admission: EM | Disposition: A | Discharge status: Home / Self Care | Payer: Self-pay | Source: Home / Self Care | Attending: Internal Medicine

## 2022-06-09 DIAGNOSIS — E876 Hypokalemia: Secondary | ICD-10-CM

## 2022-06-09 DIAGNOSIS — R7303 Prediabetes: Secondary | ICD-10-CM

## 2022-06-09 DIAGNOSIS — E782 Mixed hyperlipidemia: Secondary | ICD-10-CM

## 2022-06-09 DIAGNOSIS — I10 Essential (primary) hypertension: Secondary | ICD-10-CM

## 2022-06-09 DIAGNOSIS — Z720 Tobacco use: Secondary | ICD-10-CM

## 2022-06-09 DIAGNOSIS — K047 Periapical abscess without sinus: Secondary | ICD-10-CM

## 2022-06-09 DIAGNOSIS — J4540 Moderate persistent asthma, uncomplicated: Secondary | ICD-10-CM

## 2022-06-09 DIAGNOSIS — K219 Gastro-esophageal reflux disease without esophagitis: Secondary | ICD-10-CM

## 2022-06-09 DIAGNOSIS — F411 Generalized anxiety disorder: Secondary | ICD-10-CM

## 2022-06-09 HISTORY — DX: Periapical abscess without sinus: K04.7

## 2022-06-09 LAB — CBC
HCT: 38.7 % (ref 36.0–46.0)
Hemoglobin: 13.3 g/dL (ref 12.0–15.0)
MCH: 33.3 pg (ref 26.0–34.0)
MCHC: 34.4 g/dL (ref 30.0–36.0)
MCV: 97 fL (ref 80.0–100.0)
Platelets: 294 10*3/uL (ref 150–400)
RBC: 3.99 MIL/uL (ref 3.87–5.11)
RDW: 12.3 % (ref 11.5–15.5)
WBC: 12.5 10*3/uL — ABNORMAL HIGH (ref 4.0–10.5)
nRBC: 0 % (ref 0.0–0.2)

## 2022-06-09 LAB — BASIC METABOLIC PANEL
Anion gap: 10 (ref 5–15)
BUN: <5 mg/dL — ABNORMAL LOW (ref 6–20)
CO2: 26 mmol/L (ref 22–32)
Calcium: 9.1 mg/dL (ref 8.9–10.3)
Chloride: 103 mmol/L (ref 98–111)
Creatinine, Ser: 0.37 mg/dL — ABNORMAL LOW (ref 0.44–1.00)
GFR, Estimated: >60 mL/min (ref >60)
Glucose, Bld: 100 mg/dL — ABNORMAL HIGH (ref 70–99)
Potassium: 3.2 mmol/L — ABNORMAL LOW (ref 3.5–5.1)
Sodium: 139 mmol/L (ref 135–145)

## 2022-06-09 LAB — MRSA NEXT GEN BY PCR, NASAL: MRSA by PCR Next Gen: NOT DETECTED

## 2022-06-09 LAB — HIV ANTIBODY (ROUTINE TESTING W REFLEX): HIV Screen 4th Generation wRfx: NONREACTIVE

## 2022-06-09 SURGERY — INCISION AND DRAINAGE, ABSCESS
Anesthesia: Choice | Laterality: Right

## 2022-06-09 MED ORDER — VANCOMYCIN HCL 750 MG/150ML IV SOLN: 750.0000 mg | Freq: Two times a day (BID) | INTRAVENOUS | Status: DC

## 2022-06-09 MED ORDER — NICOTINE 14 MG/24HR TD PT24
14.0000 mg | MEDICATED_PATCH | Freq: Every day | TRANSDERMAL | Status: DC
  Filled 2022-06-09: qty 1

## 2022-06-09 MED ORDER — ONDANSETRON HCL 4 MG PO TABS
4.0000 mg | ORAL_TABLET | Freq: Four times a day (QID) | ORAL | 0 refills | Status: DC | PRN
  Filled 2022-06-09: qty 20, 5d supply, fill #0

## 2022-06-09 MED ORDER — POTASSIUM CHLORIDE CRYS ER 20 MEQ PO TBCR
40.0000 meq | EXTENDED_RELEASE_TABLET | Freq: Two times a day (BID) | ORAL | Status: DC
  Administered 2022-06-09: 40 meq via ORAL
  Filled 2022-06-09: qty 2

## 2022-06-09 MED ORDER — AMOXICILLIN-POT CLAVULANATE 875-125 MG PO TABS
1.0000 | ORAL_TABLET | Freq: Two times a day (BID) | ORAL | 0 refills | Status: DC
  Filled 2022-06-09: qty 28, 14d supply, fill #0

## 2022-06-09 MED ORDER — DOXYCYCLINE HYCLATE 100 MG PO TABS
100.0000 mg | ORAL_TABLET | Freq: Two times a day (BID) | ORAL | 0 refills | Status: AC
  Filled 2022-06-09: qty 28, 14d supply, fill #0

## 2022-06-09 MED ORDER — ACETAMINOPHEN 325 MG PO TABS
650.0000 mg | ORAL_TABLET | Freq: Four times a day (QID) | ORAL | 0 refills | Status: DC | PRN
  Filled 2022-06-09: qty 20, 3d supply, fill #0

## 2022-06-09 NOTE — Progress Notes (Addendum)
Patient ID: [name], female   DOB: 1965-09-27, 56 y.o.   MRN: [id number] The patient still has a lot of pain but actually the redness and swelling look a bit better on the right side.  She now has some edema in the infraorbital area on the left side.  She is talking well and has no airway issues whatsoever.  I talked to the dentist who took care of her implants and removed the one a week ago.  He says he is perfectly willing to see the patient in his office at 2:00 today.  He will locally drain the abscess and take care of the implants that are infected.  The dentist is also talk to the hospitalist who will discharge the patient to go to his office.  The patient was on the call with the dentist and her boyfriend was in the room listening to the conversation and the plan.  Everyone agrees that this is the best approach for her.

## 2022-06-09 NOTE — Discharge Summary (Signed)
Physician Discharge Summary   Patient: [name] MRN: [id number] DOB: [birth year]  Admit date:     06/08/2022  Discharge date: 06/09/22  Discharge Physician: Raiford Noble, DO   PCP: Rochel Brome, MD   Recommendations at discharge:   Follow up with PCP within 1-2 weeks and repeat CBC, CMP, Mag, Phos, within 1 week Follow-up with Dr. Louanne Skye of dentistry and oral surgery immediately after discharge who will do a incision and drainage in the clinic Follow-up with ENT if necessary Continue with antibiotics with Augmentin and doxycycline at the recommendations of ID  Discharge Diagnoses: Principal Problem:   Facial cellulitis/orbital cellulitis  Active Problems:   Mixed hyperlipidemia   GAD (generalized anxiety disorder)   Prediabetes   Moderate persistent asthma without complication/COPD   Primary hypertension   GERD (gastroesophageal reflux disease)   Hypokalemia   Dental abscess   Tobacco abuse  Resolved Problems:   * No resolved hospital problems. [facility] Course: HPI per Dr. Orma Flaming on 06/08/22 HPI: [name] is a 56 y.o. female with medical history significant of  HLD, anxiety, COPD, GERD, HTN who presented to ED with complaints of facial swelling. Seen in ED on 9/17 for right facial swelling and pain. She had dental implants removed on 9/5 and was put on PCN. She was seen in UC and given a shot of rocephin/steroids and put on augmentin, but due to pain went to ED. CT at that time showed no abscess. She was given pain medication and sent home. She states that on 9/16 she started to have pain in the right side of her face and teeth. On Sunday morning she woke up and had swelling on the right side of her face. She went to UC as stated above and then the ED. She states after her visit on the 9/17 her face has continued to get worse with swelling/erythema and pain. She has no headache/stridor/vision changes.      Denies any fever/chills, chest pain or  palpitations, shortness of breath or cough, abdominal pain, N/V/D, dysuria or leg swelling.    She smokes 1.5 PPD and does not drink alcohol.    ER Course:  vitals: afebrile, bp: 143/75, HR: 92, RR: 16, oxygen: 97%RA Pertinent labs: wbc: 14.2 Ct maxillofacial: Progressive right facial and periorbital inflammatory changes compatible with cellulitis. New from the prior examination of 06/06/2022, there is a superimposed 2.3 cm fluid and gas-containing focus in the region of the right nasolabial fold, compatible with an abscess. In ED: Ent consulted. Started on vanc/zosyn/clinda. Given IVF bolus and TRH asked to admit.   **Interim History She was evaluated by ENT who recommended that the cellulitis was clearly secondary to a dental abscess from a dental procedure from her prior extraction.  Unfortunately we did not have oral surgery coverage at this time and Dr. Janace Hoard recommended having her transfer to a level of care where oral surgery was available however I spoke to her dentist and oral surgeon Dr. Louanne Skye who states that he does not have possible for today more but the best course of action would be to discharge the patient home after she has gotten some more antibiotics to IV and change to p.o. antibiotics and she will evaluate the patient in clinic to do a incision and drainage there with removal of her implant.  Her face was looking better today and Dr. Janace Hoard was originally going to take her to the operating room for incision and drainage however after speaking  with the dentist who took care of her implants he will see the patient at 2 PM after discharge and likely drain abscess and take care of the implants that are infected and discussed with his oral surgery colleagues in outpatient setting.  Patient's face was improved for her significant other and she is discharged in satisfactory condition directly to her dentist office on oral antibiotics of Augmentin and doxycycline at the recommendations of  infectious diseases.  Dr. Louanne Skye will arrange her follow-up with any oral surgeons as necessary in outpatient setting.  Assessment and Plan: * Facial cellulitis/orbital cellulitis with associated dental abscess 56 year old female with 4 day history of worsening right sided facial swelling and pain found to have progressive right facial and periorbital inflammatory changes compatible with cellulitis. Also has a superimposed 2.3cm fluid and gas containing focus in the region of the right nasolabial fold, compatible with abcess -admit to telemetry -started on vanc/zosyn and given clinda in ED-continue vanc/zosyn while hospitalized and changed to doxycycline and Augmentin for discharge -had implant removed on 9/5 on right side, may need oral-maxillary consult or transfer out if determine this is due to dental issues and I spoke with her primary dentist Dr. Louanne Skye who will see her in clinic and do a incision and drainage in the clinic and referred to his oral surgery colleagues in outpatient setting -ENT consulted and will see patient and felt that this issue was clearly from a dental abscess and recommended evaluation by oral surgery however we do not have oral surgery coverage.  Dr. Janace Hoard was going to take the patient for an I&D early this morning given that it looked little bit better however after further discussion with her primary dentist the plan is to discharge her home on oral antibiotics and her primary dentist will manage and evaluate this in the clinic given that she is medically stable -pain control with oxycodone and morphine for severe pain while hospitalized  Hypokalemia -Was 3.2 this morning replete with p.o. potassium chloride 40 mg twice daily x2 doses prior to discharge - continue to monitor and trend and repeat CMP within 1 week  GERD (gastroesophageal reflux disease) Continue pepcid daily   Primary hypertension Well controlled, continue norvasc daily  Moderate persistent  asthma without complication/COPD No signs of exacerbation Continue advair, singulair and SABA prn   Prediabetes A1C in may 2023 was 5.3 Hold ozempic while inpatient Carb modified diet while hospitalized  GAD (generalized anxiety disorder) Continue xanax prn   Mixed hyperlipidemia Statin intolerant No longer taking crestor   Tobacco Abuse -Smoking Cessastion Counseling given -Nicotine patch provided   Consultants: ENT, discussed with her primary dentist and oral surgeon Dr. Louanne Skye Procedures performed: None but she did have a CT Disposition: She will be discharged directly to her dentist and oral surgeon Dr. Louanne Skye Diet recommendation:  Discharge Diet Orders (From admission, onward)     Start     Ordered   06/09/22 0000  Diet - low sodium heart healthy        06/09/22 1130           Cardiac diet DISCHARGE MEDICATION: Allergies as of 06/09/2022       Reactions   Levofloxacin Other (See Comments), Rash   Rash all over redness   Bactrim Ds [sulfamethoxazole-trimethoprim] Nausea Only   Levaquin [levofloxacin In D5w]    Lipitor [atorvastatin] Other (See Comments)   Myalgia   Tape Hives   Zetia [ezetimibe]    Muscle pain  Codeine Nausea Only   Other Rash   bandaids leave a red area if left on too long        Medication List     STOP taking these medications    Repatha 140 MG/ML Sosy Generic drug: Evolocumab   rosuvastatin 5 MG tablet Commonly known as: Crestor       TAKE these medications    acetaminophen 325 MG tablet Commonly known as: TYLENOL Take 2 tablets (650 mg total) by mouth every 6 (six) hours as needed for mild pain (or Fever >/= 101).   albuterol 108 (90 Base) MCG/ACT inhaler Commonly known as: VENTOLIN HFA Inhale 2 puffs into the lungs every 6 (six) hours as needed.   ALPRAZolam 0.25 MG tablet Commonly known as: XANAX TAKE 1 TABLET(0.25 MG) BY MOUTH DAILY AS NEEDED FOR ANXIETY   amLODipine 10 MG tablet Commonly known as:  NORVASC TAKE 1 TABLET(10 MG) BY MOUTH DAILY Strength: 10 mg   amoxicillin-clavulanate 875-125 MG tablet Commonly known as: AUGMENTIN Take 1 tablet by mouth 2 (two) times daily.   chlorhexidine 0.12 % solution Commonly known as: PERIDEX SMARTSIG:By Mouth   cyclobenzaprine 10 MG tablet Commonly known as: FLEXERIL Take 5 mg by mouth 3 (three) times daily as needed for muscle spasms.   dicyclomine 10 MG capsule Commonly known as: BENTYL TAKE 1 CAPSULE(10 MG) BY MOUTH FOUR TIMES DAILY BEFORE MEALS AND AT BEDTIME   doxycycline 100 MG tablet Commonly known as: VIBRA-TABS Take 1 tablet (100 mg total) by mouth 2 (two) times daily for 14 days.   famotidine 40 MG tablet Commonly known as: Pepcid Take 1 tablet (40 mg total) by mouth daily.   fluticasone 50 MCG/ACT nasal spray Commonly known as: FLONASE SHAKE LIQUID AND USE 2 SPRAYS IN EACH NOSTRIL DAILY   fluticasone-salmeterol 115-21 MCG/ACT inhaler Commonly known as: Advair HFA Inhale 2 puffs into the lungs 2 (two) times daily.   furosemide 20 MG tablet Commonly known as: LASIX Take 1 tablet (20 mg total) by mouth daily as needed for edema.   gabapentin 300 MG capsule Commonly known as: NEURONTIN Take 1 capsule (300 mg total) by mouth 3 (three) times daily.   HYDROcodone-acetaminophen 5-325 MG tablet Commonly known as: NORCO/VICODIN Take 1 tablet by mouth every 6 (six) hours as needed for severe pain or moderate pain.   montelukast 10 MG tablet Commonly known as: SINGULAIR TAKE 1 TABLET(10 MG) BY MOUTH DAILY   nitroGLYCERIN 0.4 MG SL tablet Commonly known as: NITROSTAT DISSOLVE 1 TABLET BY MOUTH UNDER THE TONGUE AS NEEDED FOR CHEST PAIN EVERY 5 MINUTES UP TO 3 TIMES   ondansetron 4 MG tablet Commonly known as: ZOFRAN Take 1 tablet (4 mg total) by mouth every 6 (six) hours as needed for nausea.   Ozempic (2 MG/DOSE) 8 MG/3ML Sopn Generic drug: Semaglutide (2 MG/DOSE) INJECT 2 MG AS DIRECTED ONCE A WEEK    triamcinolone 0.025 % ointment Commonly known as: KENALOG Apply 1 application. topically 2 (two) times daily. Apply to corners of mouth twice daily   vitamin E 180 MG (400 UNITS) capsule Take by mouth.   vitamin k 100 MCG tablet Take 100 mcg by mouth daily.        Follow-up Information     Cox, Elnita Maxwell, MD Follow up.   Specialties: Internal Medicine, Interventional Cardiology, Radiology, Anesthesiology Contact information: [address] [place] [address] [phone]  Discharge Exam: Filed Weights   06/08/22 1053  Weight: 73.9 kg   Vitals:   06/09/22 0503 06/09/22 0746  BP: 114/62 126/71  Pulse: (!) 101 93  Resp:    Temp: 98.8 F (37.1 C) 98.2 F (36.8 C)  SpO2: 94% 99%   Examination: Physical Exam:  Constitutional: WN/WD overweight Caucasian female with some facial swelling and erythema noted on the right side Respiratory: Diminished to auscultation bilaterally, no wheezing, rales, rhonchi or crackles. Normal respiratory effort and patient is not tachypenic. No accessory muscle use.  Unlabored breathing Cardiovascular: RRR, no murmurs / rubs / gallops. S1 and S2 auscultated. No extremity edema. 2+ pedal pulses. No carotid bruits.  Abdomen: Soft, non-tender, slightly distended secondary body habitus. Bowel sounds positive.  GU: Deferred. Musculoskeletal: No clubbing / cyanosis of digits/nails. No joint deformity upper and lower extremities Skin: No rashes, lesions, ulcers on limited skin evaluation. No induration; Warm and dry.  Neurologic: CN 2-12 grossly intact with no focal deficits. Romberg sign and cerebellar reflexes not assessed.  Psychiatric: Normal judgment and insight. Alert and oriented x 3. Normal mood and appropriate affect.   Condition at discharge: stable  The results of significant diagnostics from this hospitalization (including imaging, microbiology, ancillary and laboratory) are listed below for reference.    Imaging Studies: CT Maxillofacial W Contrast  Result Date: 06/08/2022 CLINICAL DATA:  Provided history: Facial cellulitis. Additional history provided: Dental pain and swelling. Right facial swelling which has increased since being seen in the emergency department 2 days ago. EXAM: CT MAXILLOFACIAL WITH CONTRAST TECHNIQUE: Multidetector CT imaging of the maxillofacial structures was performed with intravenous contrast. Multiplanar CT image reconstructions were also generated. RADIATION DOSE REDUCTION: This exam was performed according to the departmental dose-optimization program which includes automated exposure control, adjustment of the mA and/or kV according to patient size and/or use of iterative reconstruction technique. CONTRAST:  67m OMNIPAQUE IOHEXOL 300 MG/ML  SOLN COMPARISON:  Maxillofacial CT 06/06/2022. FINDINGS: Osseous: Streak and beam hardening artifact arising from dental restoration obscures portions of the oral cavity and perioral soft tissues. Poor dentition. All of the patient's native maxillary teeth are absent. Multiple mandibular teeth are also absent. Multiple dental implants within the maxilla and mandible. Orbits: No orbital mass or acute orbital finding. Sinuses: Mild mucosal thickening within the bilateral maxillary and ethmoid sinuses. Soft tissues: Progressive right facial and periorbital soft tissue swelling and edema, greatest in the region of the right nasal labial fold. New from the prior exam, there is a 1.2 x 1.6 x 2.3 cm fluid and gas-containing focus in the region of the right nasal labial fold compatible with an abscess (for instance as seen on series 2, image 43). Limited intracranial: No evidence of acute intracranial mallet. IMPRESSION: Progressive right facial and periorbital inflammatory changes compatible with cellulitis. New from the prior examination of 06/06/2022, there is a superimposed 2.3 cm fluid and gas-containing focus in the region of the right  nasolabial fold, compatible with an abscess. Paranasal sinus disease, as described. Electronically Signed   By: KKellie SimmeringD.O.   On: 06/08/2022 12:40   CT Maxillofacial W Contrast  Result Date: 06/06/2022 CLINICAL DATA:  Right facial pain EXAM: CT MAXILLOFACIAL WITH CONTRAST TECHNIQUE: Multidetector CT imaging of the maxillofacial structures was performed with intravenous contrast. Multiplanar CT image reconstructions were also generated. RADIATION DOSE REDUCTION: This exam was performed according to the departmental dose-optimization program which includes automated exposure control, adjustment of the mA and/or kV according to patient size and/or  use of iterative reconstruction technique. CONTRAST:  103m OMNIPAQUE IOHEXOL 300 MG/ML  SOLN COMPARISON:  None Available. FINDINGS: Osseous: Areas of the oral cavity and adjacent soft tissues are obscured by streak artifacts from metallic dental hardware. There is inflammatory change in the soft tissues overlying the upper right maxillary teeth, greatest at the right nasolabial fold. There is no discrete abscess. Orbits: Negative. No traumatic or inflammatory finding. Sinuses: Clear. Soft tissues: Negative. Limited intracranial: No significant or unexpected finding. IMPRESSION: Inflammatory change in the soft tissues overlying the upper right maxillary teeth, greatest at the right nasolabial fold. No discrete abscess. Electronically Signed   By: KUlyses JarredM.D.   On: 06/06/2022 19:41   DG Chest 2 View  Result Date: 06/06/2022 CLINICAL DATA:  Dental infection. EXAM: CHEST - 2 VIEW COMPARISON:  05/20/2020 FINDINGS: Heart size appears normal. No pleural effusion or edema. No pleural effusion or scratch set no airspace opacities identified. Scarring noted within the right middle lobe. Visualized osseous structures are unremarkable. IMPRESSION: No active cardiopulmonary abnormalities. Electronically Signed   By: TKerby MoorsM.D.   On: 06/06/2022 17:41     Microbiology: Results for orders placed or performed during the hospital encounter of 06/08/22  MRSA Next Gen by PCR, Nasal     Status: None   Collection Time: 06/09/22  8:41 AM   Specimen: Nasal Mucosa; Nasal Swab  Result Value Ref Range Status   MRSA by PCR Next Gen NOT DETECTED NOT DETECTED Final    Comment: (NOTE) The GeneXpert MRSA Assay (FDA approved for NASAL specimens only), is one component of a comprehensive MRSA colonization surveillance program. It is not intended to diagnose MRSA infection nor to guide or monitor treatment for MRSA infections. Test performance is not FDA approved in patients less than 240years old. Performed at [facility] [address] [place] [address]   Labs: CBC: Recent Labs  Lab 06/06/22 1707 06/08/22 1137 06/09/22 0115  WBC 12.4* 14.2* 12.5*  NEUTROABS 9.9* 10.9*  --   HGB 13.4 14.3 13.3  HCT 38.3 41.8 38.7  MCV 94.3 96.1 97.0  PLT 263 288 2914  Basic Metabolic Panel: Recent Labs  Lab 06/06/22 1707 06/08/22 1137 06/09/22 0115  NA 135 137 139  K 3.5 3.5 3.2*  CL 100 100 103  CO2 '26 26 26  '$ GLUCOSE 122* 108* 100*  BUN 8 6 <5*  CREATININE 0.62 0.45 0.37*  CALCIUM 9.4 9.5 9.1   Liver Function Tests: Recent Labs  Lab 06/06/22 1707  AST 14*  ALT 21  ALKPHOS 86  BILITOT 0.3  PROT 6.8  ALBUMIN 4.4   CBG: No results for input(s): "GLUCAP" in the last 168 hours.  Discharge time spent: greater than 30 minutes.  Signed: ORaiford Noble DO [facility] 06/09/2022

## 2022-06-09 NOTE — Progress Notes (Signed)
Nsg Discharge Note  Admit Date:  06/08/2022 Discharge date: 06/09/2022   [name] to be D/C'd Home per MD order.  AVS completed.  Patient/caregiver able to verbalize understanding.  Discharge Medication: Allergies as of 06/09/2022       Reactions   Levofloxacin Other (See Comments), Rash   Rash all over redness   Bactrim Ds [sulfamethoxazole-trimethoprim] Nausea Only   Levaquin [levofloxacin In D5w]    Lipitor [atorvastatin] Other (See Comments)   Myalgia   Tape Hives   Zetia [ezetimibe]    Muscle pain   Codeine Nausea Only   Other Rash   bandaids leave a red area if left on too long        Medication List     STOP taking these medications    Repatha 140 MG/ML Sosy Generic drug: Evolocumab   rosuvastatin 5 MG tablet Commonly known as: Crestor       TAKE these medications    acetaminophen 325 MG tablet Commonly known as: TYLENOL Take 2 tablets (650 mg total) by mouth every 6 (six) hours as needed for mild pain (or Fever >/= 101).   albuterol 108 (90 Base) MCG/ACT inhaler Commonly known as: VENTOLIN HFA Inhale 2 puffs into the lungs every 6 (six) hours as needed.   ALPRAZolam 0.25 MG tablet Commonly known as: XANAX TAKE 1 TABLET(0.25 MG) BY MOUTH DAILY AS NEEDED FOR ANXIETY   amLODipine 10 MG tablet Commonly known as: NORVASC TAKE 1 TABLET(10 MG) BY MOUTH DAILY Strength: 10 mg   amoxicillin-clavulanate 875-125 MG tablet Commonly known as: AUGMENTIN Take 1 tablet by mouth 2 (two) times daily.   chlorhexidine 0.12 % solution Commonly known as: PERIDEX SMARTSIG:By Mouth   cyclobenzaprine 10 MG tablet Commonly known as: FLEXERIL Take 5 mg by mouth 3 (three) times daily as needed for muscle spasms.   dicyclomine 10 MG capsule Commonly known as: BENTYL TAKE 1 CAPSULE(10 MG) BY MOUTH FOUR TIMES DAILY BEFORE MEALS AND AT BEDTIME   doxycycline 100 MG tablet Commonly known as: VIBRA-TABS Take 1 tablet (100 mg total) by mouth 2 (two) times daily for  14 days.   famotidine 40 MG tablet Commonly known as: Pepcid Take 1 tablet (40 mg total) by mouth daily.   fluticasone 50 MCG/ACT nasal spray Commonly known as: FLONASE SHAKE LIQUID AND USE 2 SPRAYS IN EACH NOSTRIL DAILY   fluticasone-salmeterol 115-21 MCG/ACT inhaler Commonly known as: Advair HFA Inhale 2 puffs into the lungs 2 (two) times daily.   furosemide 20 MG tablet Commonly known as: LASIX Take 1 tablet (20 mg total) by mouth daily as needed for edema.   gabapentin 300 MG capsule Commonly known as: NEURONTIN Take 1 capsule (300 mg total) by mouth 3 (three) times daily.   HYDROcodone-acetaminophen 5-325 MG tablet Commonly known as: NORCO/VICODIN Take 1 tablet by mouth every 6 (six) hours as needed for severe pain or moderate pain.   montelukast 10 MG tablet Commonly known as: SINGULAIR TAKE 1 TABLET(10 MG) BY MOUTH DAILY   nitroGLYCERIN 0.4 MG SL tablet Commonly known as: NITROSTAT DISSOLVE 1 TABLET BY MOUTH UNDER THE TONGUE AS NEEDED FOR CHEST PAIN EVERY 5 MINUTES UP TO 3 TIMES   ondansetron 4 MG tablet Commonly known as: ZOFRAN Take 1 tablet (4 mg total) by mouth every 6 (six) hours as needed for nausea.   Ozempic (2 MG/DOSE) 8 MG/3ML Sopn Generic drug: Semaglutide (2 MG/DOSE) INJECT 2 MG AS DIRECTED ONCE A WEEK   triamcinolone 0.025 % ointment  Commonly known as: KENALOG Apply 1 application. topically 2 (two) times daily. Apply to corners of mouth twice daily   vitamin E 180 MG (400 UNITS) capsule Take by mouth.   vitamin k 100 MCG tablet Take 100 mcg by mouth daily.        Discharge Assessment: Vitals:   06/09/22 0503 06/09/22 0746  BP: 114/62 126/71  Pulse: (!) 101 93  Resp:    Temp: 98.8 F (37.1 C) 98.2 F (36.8 C)  SpO2: 94% 99%   Skin clean, dry and intact without evidence of skin break down, no evidence of skin tears noted. IV catheter discontinued intact. Site without signs and symptoms of complications - no redness or edema noted at  insertion site, patient denies c/o pain - only slight tenderness at site.  Dressing with slight pressure applied.  D/c Instructions-Education: Discharge instructions given to patient/family with verbalized understanding. D/c education completed with patient/family including follow up instructions, medication list, d/c activities limitations if indicated, with other d/c instructions as indicated by MD - patient able to verbalize understanding, all questions fully answered. Patient instructed to return to ED, call 911, or call MD for any changes in condition.  Patient escorted via [place], and D/C home via private auto.  Atilano Ina, RN 06/09/2022 12:15 PM

## 2022-06-13 ENCOUNTER — Emergency Department (HOSPITAL_BASED_OUTPATIENT_CLINIC_OR_DEPARTMENT_OTHER): Payer: Self-pay

## 2022-06-13 ENCOUNTER — Emergency Department (HOSPITAL_BASED_OUTPATIENT_CLINIC_OR_DEPARTMENT_OTHER)
Admission: EM | Admit: 2022-06-13 | Discharge: 2022-06-13 | Disposition: A | Discharge status: Home / Self Care | Payer: Self-pay | Attending: Emergency Medicine | Admitting: Emergency Medicine

## 2022-06-13 ENCOUNTER — Other Ambulatory Visit: Payer: Self-pay

## 2022-06-13 ENCOUNTER — Encounter (HOSPITAL_BASED_OUTPATIENT_CLINIC_OR_DEPARTMENT_OTHER): Payer: Self-pay

## 2022-06-13 DIAGNOSIS — I10 Essential (primary) hypertension: Secondary | ICD-10-CM | POA: Insufficient documentation

## 2022-06-13 DIAGNOSIS — J449 Chronic obstructive pulmonary disease, unspecified: Secondary | ICD-10-CM | POA: Insufficient documentation

## 2022-06-13 DIAGNOSIS — L03211 Cellulitis of face: Secondary | ICD-10-CM | POA: Insufficient documentation

## 2022-06-13 DIAGNOSIS — Z79899 Other long term (current) drug therapy: Secondary | ICD-10-CM | POA: Insufficient documentation

## 2022-06-13 LAB — CBC
HCT: 39.4 % (ref 36.0–46.0)
Hemoglobin: 13.4 g/dL (ref 12.0–15.0)
MCH: 32.5 pg (ref 26.0–34.0)
MCHC: 34 g/dL (ref 30.0–36.0)
MCV: 95.6 fL (ref 80.0–100.0)
Platelets: 387 10*3/uL (ref 150–400)
RBC: 4.12 MIL/uL (ref 3.87–5.11)
RDW: 12 % (ref 11.5–15.5)
WBC: 9.3 10*3/uL (ref 4.0–10.5)
nRBC: 0 % (ref 0.0–0.2)

## 2022-06-13 LAB — BASIC METABOLIC PANEL
Anion gap: 8 (ref 5–15)
BUN: 8 mg/dL (ref 6–20)
CO2: 29 mmol/L (ref 22–32)
Calcium: 9.8 mg/dL (ref 8.9–10.3)
Chloride: 100 mmol/L (ref 98–111)
Creatinine, Ser: 0.5 mg/dL (ref 0.44–1.00)
GFR, Estimated: >60 mL/min (ref >60)
Glucose, Bld: 98 mg/dL (ref 70–99)
Potassium: 4.1 mmol/L (ref 3.5–5.1)
Sodium: 137 mmol/L (ref 135–145)

## 2022-06-13 LAB — C-REACTIVE PROTEIN: CRP: 1.6 mg/dL — ABNORMAL HIGH (ref <1.0)

## 2022-06-13 LAB — SEDIMENTATION RATE: Sed Rate: 44 mm/hr — ABNORMAL HIGH (ref 0–22)

## 2022-06-13 MED ORDER — LIDOCAINE-EPINEPHRINE-TETRACAINE (LET) TOPICAL GEL
3.0000 mL | Freq: Once | TOPICAL | Status: AC
  Administered 2022-06-13: 3 mL via TOPICAL
  Filled 2022-06-13: qty 3

## 2022-06-13 MED ORDER — DEXTROSE 5 % IV SOLN
1500.0000 mg | Freq: Once | INTRAVENOUS | Status: AC
  Administered 2022-06-13: 1500 mg via INTRAVENOUS
  Filled 2022-06-13 (×2): qty 75

## 2022-06-13 MED ORDER — IOHEXOL 300 MG/ML  SOLN
100.0000 mL | Freq: Once | INTRAMUSCULAR | Status: AC | PRN
  Administered 2022-06-13: 75 mL via INTRAVENOUS

## 2022-06-13 MED ORDER — DEXTROSE 5 % IV SOLN: 1500.0000 mg | Freq: Once | INTRAVENOUS | Status: DC

## 2022-06-13 MED ORDER — MORPHINE SULFATE (PF) 4 MG/ML IV SOLN
4.0000 mg | Freq: Once | INTRAVENOUS | Status: AC
  Administered 2022-06-13: 4 mg via INTRAVENOUS
  Filled 2022-06-13: qty 1

## 2022-06-13 NOTE — ED Notes (Signed)
RN provided AVS using Teachback Method. Patient verbalizes understanding of Discharge Instructions. Opportunity for Questioning and Answers were provided by RN. Patient Discharged from ED ambulatory to Home with Family. ? ?

## 2022-06-13 NOTE — ED Provider Notes (Signed)
  Physical Exam  BP 128/69   Pulse 73   Temp 97.7 F (36.5 C) (Oral)   Resp 16   Ht '5\' 7"'$  (1.702 m)   Wt 73.5 kg   LMP  (LMP Unknown) Comment: hysterectomy 2008  SpO2 92%   BMI 25.37 kg/m   Physical Exam  Procedures  Procedures  ED Course / MDM   Clinical Course as of 06/13/22 1953  Sun Jun 13, 2022  1515 CT Maxillofacial W Contrast IMPRESSION: 1. Interval resection of 2 right mandibular implants. 2. Progressive soft tissue swelling and enhancement centered about the right maxilla at the more laterally resected implant. This is now the center of the inflammatory process. 3. No drainable abscess is present. 4. The 2 residual left maxillary implants remain. 5. Mild mucosal thickening along the anterior inferior maxillary sinuses bilaterally. [RP]  3846 Signed out to Dr Billy Fischer awaiting abx. Referrals for ENT and ID placed.  [RP]    Clinical Course User Index [RP] Fransico Meadow, MD   Received care of pt from previous provider. Please see Dr. Shon Baton note for prior care. Recently was in hospital for IV abx and ENT consult, transferred to [facility] and had removal of dental implants/oral surgery and has continued drainage from mouth since then and presented with facial swelling and pain. CT didn't show drainable abscess. Dr. Philip Aspen has ordered dalvance after discussion with patient and plan to discharge after infusion and continue augmentin.  While CT does not show large drainable abscess on exam suspect abscess not visualized on CT imaging. Discussed with patient and offered incision and drainage. Agree to tiny incision and this was perfomed after LET gel applied.  Moderate-copious amount of drainage from small area.  Discussed requires very close follow up with ENT/oral surgeon.        Gareth Morgan, MD 06/14/22 2329

## 2022-06-13 NOTE — ED Notes (Signed)
MD at the Bedside. 

## 2022-06-13 NOTE — Discharge Instructions (Addendum)
Today you were seen in the emergency department for your facial infection.    Your CT did not show an obvious abscess or drainable collection, however we did discover an abscess on physical exam and were able to drain this.  We hope that with your implant removal, abx, and drainage that your symptoms will improve but worry that you may require a more extensive drainage in the future.  At home, please continue your Augmentin for the full course.    Follow-up with your primary doctor in 2-3 days regarding your visit.  Follow-up with ENT and infectious disease regarding your infection.  Return immediately to the emergency department if you experience any of the following: Severe headaches, vision changes, numbness or weakness of the face, fevers, worsening redness or swelling, or any other concerning symptoms.    Thank you for visiting our Emergency Department. It was a pleasure taking care of you today.

## 2022-06-13 NOTE — ED Provider Notes (Signed)
[facility] EMERGENCY DEPT Provider Note   CSN: [id number] Arrival date & time: 06/13/22  0865     History  Chief Complaint  Patient presents with   Cellulitis    [name] is a 56 y.o. female.  56 year old female with a history of hypertension, hyperlipidemia, anxiety and COPD who presents to the emergency department with facial pain and swelling.  Patient had a implant removed on 05/25/2022.  Says that afterwards she started experiencing facial swelling on the right side.  Says that on 9/16 it worsened and she went to the emergency department.  Was admitted shortly afterwards and received vancomycin and Zosyn.  She was seen by Dr. Janace Hoard from ENT and I&D was considered but they opted to discharge the patient with dentistry follow-up.  She reports that after she was discharged on 9/20 she went over to her dentist office who performed a tooth extraction and that she is continue taking Augmentin.  Reports that the swelling has continued and came to a head on her right cheek.  Says that she also did have some green discharge that came from her mouth so decided to come into the emergency department for evaluation.    Face on the day of DC:       Home Medications Prior to Admission medications   Medication Sig Start Date End Date Taking? Authorizing Provider  acetaminophen (TYLENOL) 325 MG tablet Take 2 tablets (650 mg total) by mouth every 6 (six) hours as needed for mild pain (or Fever >/= 101). 06/09/22   Raiford Noble Latif, DO  albuterol (VENTOLIN HFA) 108 (90 Base) MCG/ACT inhaler Inhale 2 puffs into the lungs every 6 (six) hours as needed.    [provider]  ALPRAZolam (XANAX) 0.25 MG tablet TAKE 1 TABLET(0.25 MG) BY MOUTH DAILY AS NEEDED FOR ANXIETY 05/06/22   Cox, Kirsten, MD  amLODipine (NORVASC) 10 MG tablet TAKE 1 TABLET(10 MG) BY MOUTH DAILY Strength: 10 mg 10/20/21   Cox, Kirsten, MD  amoxicillin-clavulanate (AUGMENTIN) 875-125 MG tablet Take 1 tablet  by mouth 2 (two) times daily. 06/09/22   Raiford Noble Latif, DO  chlorhexidine (PERIDEX) 0.12 % solution SMARTSIG:By Mouth 05/23/22   [provider]  cyclobenzaprine (FLEXERIL) 10 MG tablet Take 5 mg by mouth 3 (three) times daily as needed for muscle spasms.    [provider]  dicyclomine (BENTYL) 10 MG capsule TAKE 1 CAPSULE(10 MG) BY MOUTH FOUR TIMES DAILY BEFORE MEALS AND AT BEDTIME 10/02/21   Cox, Kirsten, MD  doxycycline (VIBRA-TABS) 100 MG tablet Take 1 tablet (100 mg total) by mouth 2 (two) times daily for 14 days. 06/09/22 06/23/22  Raiford Noble Latif, DO  famotidine (PEPCID) 40 MG tablet Take 1 tablet (40 mg total) by mouth daily. 01/22/22   Cox, Elnita Maxwell, MD  fluticasone [facility]) 50 MCG/ACT nasal spray SHAKE LIQUID AND USE 2 SPRAYS IN [facility] NOSTRIL DAILY 03/02/22   Rip Harbour, NP  fluticasone-salmeterol (ADVAIR HFA) 115-21 MCG/ACT inhaler Inhale 2 puffs into the lungs 2 (two) times daily. 03/02/22   Cox, Elnita Maxwell, MD  furosemide (LASIX) 20 MG tablet Take 1 tablet (20 mg total) by mouth daily as needed for edema. 03/25/22   Cox, Elnita Maxwell, MD  gabapentin (NEURONTIN) 300 MG capsule Take 1 capsule (300 mg total) by mouth 3 (three) times daily. 04/19/22   CoxElnita Maxwell, MD  HYDROcodone-acetaminophen (NORCO/VICODIN) 5-325 MG tablet Take 1 tablet by mouth every 6 (six) hours as needed for severe pain or moderate pain. 06/06/22  Caccavale, Sophia, PA-C  montelukast (SINGULAIR) 10 MG tablet TAKE 1 TABLET(10 MG) BY MOUTH DAILY 10/26/21   Cox, Kirsten, MD  nitroGLYCERIN (NITROSTAT) 0.4 MG SL tablet DISSOLVE 1 TABLET BY MOUTH UNDER THE TONGUE AS NEEDED FOR CHEST PAIN EVERY 5 MINUTES UP TO 3 TIMES 12/23/20   Marge Duncans, PA-C  ondansetron (ZOFRAN) 4 MG tablet Take 1 tablet (4 mg total) by mouth every 6 (six) hours as needed for nausea. 06/09/22   Sheikh, Omair Latif, DO  OZEMPIC, 2 MG/DOSE, 8 MG/3ML SOPN INJECT 2 MG AS DIRECTED ONCE A WEEK 04/12/22   Cox, Kirsten, MD  triamcinolone (KENALOG) 0.025  % ointment Apply 1 application. topically 2 (two) times daily. Apply to corners of mouth twice daily 12/01/21   Rip Harbour, NP  vitamin E 180 MG (400 UNITS) capsule Take by mouth.    [provider]  vitamin k 100 MCG tablet Take 100 mcg by mouth daily.    [provider]      Allergies    Levofloxacin, Bactrim ds [sulfamethoxazole-trimethoprim], Levaquin [levofloxacin in d5w], Lipitor [atorvastatin], Tape, Zetia [ezetimibe], Codeine, and Other    Review of Systems   Review of Systems  Physical Exam Updated Vital Signs BP 127/65 (BP Location: Right Arm)   Pulse 76   Temp 97.7 F (36.5 C) (Oral)   Resp 16   Ht '5\' 7"'$  (1.702 m)   Wt 73.5 kg   LMP  (LMP Unknown) Comment: hysterectomy 2008  SpO2 94%   BMI 25.37 kg/m  Physical Exam Vitals and nursing note reviewed.  Constitutional:      General: She is not in acute distress.    Appearance: She is well-developed.  HENT:     Head: Atraumatic.     Comments: See images below for facial infection    Right Ear: External ear normal.     Left Ear: External ear normal.     Nose: Nose normal.     Mouth/Throat:     Comments: Upper teeth surgically removed.  Sutures in place with healing gingiva.  No abscesses or fluctuance noted.  No significant purulent discharge or green discharge noted. Eyes:     Extraocular Movements: Extraocular movements intact.     Conjunctiva/sclera: Conjunctivae normal.     Pupils: Pupils are equal, round, and reactive to light.  Cardiovascular:     Rate and Rhythm: Normal rate and regular rhythm.     Heart sounds: No murmur heard. Pulmonary:     Effort: Pulmonary effort is normal. No respiratory distress.  Abdominal:     General: Abdomen is flat. There is no distension.  Musculoskeletal:        General: No swelling.     Cervical back: Normal range of motion and neck supple.     Right lower leg: No edema.  Skin:    General: Skin is warm and dry.     Capillary Refill: Capillary  refill takes less than 2 seconds.     Findings: Rash present.  Neurological:     Mental Status: She is alert and oriented to person, place, and time. Mental status is at baseline.  Psychiatric:        Mood and Affect: Mood normal.    Face today:     ED Results / Procedures / Treatments   Labs (all labs ordered are listed, but only abnormal results are displayed) Labs Reviewed  SEDIMENTATION RATE - Abnormal; Notable for the following components:      Result  Value   Sed Rate 44 (*)    All other components within normal limits  C-REACTIVE PROTEIN - Abnormal; Notable for the following components:   CRP 1.6 (*)    All other components within normal limits  CBC  BASIC METABOLIC PANEL    EKG None  Radiology CT Maxillofacial W Contrast  Result Date: 06/13/2022 CLINICAL DATA:  Facial/dental abscess. Patient was recently rib discharge from hospital for the same diagnosis. Now with worsening symptoms. EXAM: CT MAXILLOFACIAL WITH CONTRAST TECHNIQUE: Multidetector CT imaging of the maxillofacial structures was performed with intravenous contrast. Multiplanar CT image reconstructions were also generated. RADIATION DOSE REDUCTION: This exam was performed according to the departmental dose-optimization program which includes automated exposure control, adjustment of the mA and/or kV according to patient size and/or use of iterative reconstruction technique. CONTRAST:  35m OMNIPAQUE IOHEXOL 300 MG/ML  SOLN COMPARISON:  CT of the maxillofacial 06/08/2022 and 06/06/2022 FINDINGS: Osseous: Right-sided maxillary tooth implants removed. 2 residual left maxillary implants remain. Lucency about the site of the more lateral resected right implant is noted with erosion of the lateral margin. This is now the center of the inflammatory process. The mandible is intact and located. No acute fractures are present. Orbits: Right in for but ule soft tissue swelling is present. Globes and orbits are otherwise within  normal limits. Sinuses: Mild mucosal thickening is present along the anterior inferior maxillary sinuses bilaterally. The paranasal sinuses and mastoid air cells are otherwise clear. Soft tissues: Progressive soft tissue swelling and enhancement is centered about the right maxilla at the resected implant. This extends superiorly along the right side of the nose with edema extending anteriorly into the nose. No drainable abscess is present Limited intracranial: Within normal limits. IMPRESSION: 1. Interval resection of 2 right mandibular implants. 2. Progressive soft tissue swelling and enhancement centered about the right maxilla at the more laterally resected implant. This is now the center of the inflammatory process. 3. No drainable abscess is present. 4. The 2 residual left maxillary implants remain. 5. Mild mucosal thickening along the anterior inferior maxillary sinuses bilaterally. Electronically Signed   By: CSan MorelleM.D.   On: 06/13/2022 13:20    Procedures Procedures    Medications Ordered in ED Medications  iohexol (OMNIPAQUE) 300 MG/ML solution 100 mL (75 mLs Intravenous Contrast Given 06/13/22 1256)  morphine (PF) 4 MG/ML injection 4 mg (4 mg Intravenous Given 06/13/22 1307)  dalbavancin (DALVANCE) 1,500 mg in dextrose 5 % 500 mL IVPB (1,500 mg Intravenous New Bag/Given 06/13/22 1738)    ED Course/ Medical Decision Making/ A&P Clinical Course as of 06/13/22 1818  Sun Jun 13, 2022  1515 CT Maxillofacial W Contrast IMPRESSION: 1. Interval resection of 2 right mandibular implants. 2. Progressive soft tissue swelling and enhancement centered about the right maxilla at the more laterally resected implant. This is now the center of the inflammatory process. 3. No drainable abscess is present. 4. The 2 residual left maxillary implants remain. 5. Mild mucosal thickening along the anterior inferior maxillary sinuses bilaterally. [RP]  [id number] out to Dr SBilly Fischerawaiting abx.  Referrals for ENT and ID placed.  [RP]    Clinical Course User Index [RP] PFransico Meadow MD                           Medical Decision Making Amount and/or Complexity of Data Reviewed Labs: ordered. Radiology: ordered. Decision-making details documented in ED Course.  Risk Prescription  drug management.   [name] is a 56 year old female with a history of hypertension, hyperlipidemia, anxiety and COPD who presents to the emergency department with facial pain and swelling.   Initial Ddx:  Facial cellulitis, abscess, osteomyelitis, dental infection  MDM:  Given the pictures from the day of discharge versus today appears that the patient's facial cellulitis is improving though has not completely resolved.  Given this was concern for possible abscess so will obtain imaging.  Possible that her infection could involve bones of the face but feel that this is less likely osteomyelitis.  Considered dental infection but the patient's involved teeth were removed by her dentist so feel that this is also less likely.  Plan:  Labs CT max face with contrast  ED Summary:  Patient had CT of the face with contrast which did not show an abscess.  Did show worsening enhancement and infection around the recently resected implant.  Also showed mild mucosal thickening in the maxillary sinuses.  Given the persistence of the patient's symptoms we will broaden her antibiotics to prevent worsening infection.  Did perform shared decision-making with the patient and her family about admission versus outpatient antibiotics and she opted to receive dalbavancin in the emergency department and continue her Augmentin for anaerobic coverage.  We will have her follow-up with ENT as an outpatient.  Return precautions discussed with the patient and her family prior to discharge.   Dispo: DC Home. Return precautions discussed including, but not limited to, those listed in the AVS. Allowed pt time to ask questions  which were answered fully prior to dc.   Additional history obtained from family Records reviewed DC Summary The following labs were independently interpreted: Chemistry  Final Clinical Impression(s) / ED Diagnoses Final diagnoses:  Facial cellulitis    Rx / DC Orders ED Discharge Orders          Ordered    Ambulatory referral to Infectious Disease       Comments: Cellulitis patient:  Received dalbavancin on 06/13/2022.   06/13/22 1542    Ambulatory referral to ENT        06/13/22 1816              Fransico Meadow, MD 06/13/22 1818

## 2022-06-13 NOTE — ED Notes (Signed)
[place] regarding [facility]. Currently being processed to be sent from [facility].

## 2022-06-13 NOTE — Progress Notes (Signed)
Pharmacy Note:  Dalbavancin for Acute Bacterial Skin and Skin Structure Infection (ABSSSI) Patients to [facility] Discharge  [name] is an 56 y.o. female who presented to [facility] on 06/13/2022 with an Acute Bacterial Skin and Skin Structure Infection  [facility] will be contacted by [facility] for stat [facility]  Inclusion criteria - Indication '[x]'$  Cellulitis  Patient was evaluated for the following exclusion criteria and no exclusions were found  Hardware involvement, Hypotension / shock, Elevated lactate (>2) without other explanation, ram-negative infection risk factors (bites, water exposure, infection after trauma, infection after skin graft, neutropenia, burns, severe immunocompromise), necrotizing fasciitis possible or confirmed, Known or suspected osteomyelitis or septic arthritis, endocarditis, diabetic foot infection, ischemic ulcers, post-operative wound infection, perirectal infections, need for drainage in the operating room, hand or facial infections, injection drug users with a fever, bacteremia, pregnancy or breastfeeding, allergy to related antibiotics like vancomycin, known liver disease (t.bili >2x ULN or AST/ALT 3x ULN)  Lorelei Pont, PharmD, BCPS 06/13/2022 3:46 PM ED Clinical Pharmacist -  [phone]

## 2022-06-13 NOTE — ED Notes (Signed)
Pt requests labs to be drawn when in room.

## 2022-06-13 NOTE — ED Notes (Signed)
Patient transported to Radiology 

## 2022-06-13 NOTE — ED Triage Notes (Signed)
Pt states that she was admitted to the hospital last week for cellulitis on her face. Pt states that it had been getting better, but is now red and angry. Noted on right side of face.

## 2022-06-14 ENCOUNTER — Other Ambulatory Visit: Payer: Self-pay | Admitting: Family Medicine

## 2022-06-14 NOTE — Telephone Encounter (Signed)
Patient is requesting diflucan due to her being on an antibiotic. Was seen in the hospital for dental infection. Please advise

## 2022-06-15 ENCOUNTER — Telehealth: Payer: Self-pay

## 2022-06-15 MED ORDER — FLUCONAZOLE 150 MG PO TABS: 150.0000 mg | ORAL_TABLET | Freq: Every day | ORAL | 0 refills | Status: DC

## 2022-06-15 NOTE — Telephone Encounter (Signed)
[name] called with possible yeast infection from her recent antibiotics.  Dr. Tobie Poet advised diflucan 150 mg x 2 days.  To follow-up in the office if symptoms do not clear.

## 2022-06-16 ENCOUNTER — Other Ambulatory Visit: Payer: Self-pay | Admitting: Family Medicine

## 2022-06-16 DIAGNOSIS — G629 Polyneuropathy, unspecified: Secondary | ICD-10-CM

## 2022-06-17 ENCOUNTER — Ambulatory Visit (INDEPENDENT_AMBULATORY_CARE_PROVIDER_SITE_OTHER): Payer: Self-pay | Admitting: Family Medicine

## 2022-06-17 VITALS — BP 116/70 | HR 88 | Temp 97.3°F | Resp 18 | Ht 66.0 in | Wt 163.0 lb

## 2022-06-17 DIAGNOSIS — L03211 Cellulitis of face: Secondary | ICD-10-CM

## 2022-06-17 DIAGNOSIS — K047 Periapical abscess without sinus: Secondary | ICD-10-CM

## 2022-06-17 NOTE — Progress Notes (Signed)
Subjective:  Patient ID: [name], female    DOB: 1966/02/22  Age: 56 y.o. MRN: [id number]  Chief Complaint  Patient presents with   Follow-up     HPI [name] comes with ED follow-up.  Was initially seen on June 06, 2022.  At the [facility] for facial pain and swelling following the removal of dental implants.  These were removed 05/25/2022 and continued to have facial pain and swelling.  At that time she was discharged on her current antibiotic with some pain medicine.  She returned on June 06, 2022 to the med center and was transferred to [facility] where she was treated with IV antibiotics.  She was given  vancomycin and zosyn at the ED.  She was seen by ENT and I & D was considered but they opted to discharge her to dentistry.   On 06/09/2022 her dentist extracted her tooth and she was started on Augmentin.  On this past Sunday she called me, as I was the on-call physician.  Per her description I recommended she return to the med center.  This was on June 13, 2022.  She had formed an abscess while of her right cheek.  This was I&D.  She was given IV antibiotics in the emergency department and then discharged home to complete her oral antibiotics.  She is feeling significantly better.  She is improved over the last 3 days.  She denies any fevers.  The meds are had recommended she see infectious disease however as she is improving she is reluctant to do this.  She does not have a health insurance currently  Current Outpatient Medications on File Prior to Visit  Medication Sig Dispense Refill   acetaminophen (TYLENOL) 325 MG tablet Take 2 tablets (650 mg total) by mouth every 6 (six) hours as needed for mild pain (or Fever >/= 101). 20 tablet 0   albuterol (VENTOLIN HFA) 108 (90 Base) MCG/ACT inhaler Inhale 2 puffs into the lungs every 6 (six) hours as needed.     ALPRAZolam (XANAX) 0.25 MG tablet TAKE 1 TABLET(0.25 MG) BY MOUTH DAILY AS NEEDED FOR ANXIETY  30 tablet 2   amLODipine (NORVASC) 10 MG tablet TAKE 1 TABLET(10 MG) BY MOUTH DAILY Strength: 10 mg 90 tablet 1   amoxicillin-clavulanate (AUGMENTIN) 875-125 MG tablet Take 1 tablet by mouth 2 (two) times daily. 28 tablet 0   chlorhexidine (PERIDEX) 0.12 % solution SMARTSIG:By Mouth     cyclobenzaprine (FLEXERIL) 10 MG tablet Take 5 mg by mouth 3 (three) times daily as needed for muscle spasms.     dicyclomine (BENTYL) 10 MG capsule TAKE 1 CAPSULE(10 MG) BY MOUTH FOUR TIMES DAILY BEFORE MEALS AND AT BEDTIME 120 capsule 2   doxycycline (VIBRA-TABS) 100 MG tablet Take 1 tablet (100 mg total) by mouth 2 (two) times daily for 14 days. 28 tablet 0   famotidine (PEPCID) 40 MG tablet Take 1 tablet (40 mg total) by mouth daily. 90 tablet 1   fluconazole (DIFLUCAN) 150 MG tablet Take 1 tablet (150 mg total) by mouth daily. 2 tablet 0   fluticasone (FLONASE) 50 MCG/ACT nasal spray SHAKE LIQUID AND USE 2 SPRAYS IN EACH NOSTRIL DAILY 16 g 6   fluticasone-salmeterol (ADVAIR HFA) 115-21 MCG/ACT inhaler Inhale 2 puffs into the lungs 2 (two) times daily. 1 each 12   furosemide (LASIX) 20 MG tablet Take 1 tablet (20 mg total) by mouth daily as needed for edema. 1 tablet 0   gabapentin (  NEURONTIN) 300 MG capsule TAKE 1 CAPSULE(300 MG) BY MOUTH THREE TIMES DAILY 90 capsule 1   HYDROcodone-acetaminophen (NORCO/VICODIN) 5-325 MG tablet Take 1 tablet by mouth every 6 (six) hours as needed for severe pain or moderate pain. 10 tablet 0   montelukast (SINGULAIR) 10 MG tablet TAKE 1 TABLET(10 MG) BY MOUTH DAILY 90 tablet 0   nitroGLYCERIN (NITROSTAT) 0.4 MG SL tablet DISSOLVE 1 TABLET BY MOUTH UNDER THE TONGUE AS NEEDED FOR CHEST PAIN EVERY 5 MINUTES UP TO 3 TIMES 25 tablet 0   ondansetron (ZOFRAN) 4 MG tablet Take 1 tablet (4 mg total) by mouth every 6 (six) hours as needed for nausea. 20 tablet 0   OZEMPIC, 2 MG/DOSE, 8 MG/3ML SOPN INJECT 2 MG AS DIRECTED ONCE A WEEK 9 mL 0   triamcinolone (KENALOG) 0.025 % ointment Apply  1 application. topically 2 (two) times daily. Apply to corners of mouth twice daily 30 g 0   vitamin E 180 MG (400 UNITS) capsule Take by mouth.     vitamin k 100 MCG tablet Take 100 mcg by mouth daily.     No current facility-administered medications on file prior to visit.   Past Medical History:  Diagnosis Date   Allergic rhinitis    Arthritis    Blood transfusion without reported diagnosis    COPD (chronic obstructive pulmonary disease) (HCC)    GERD (gastroesophageal reflux disease)    History of colon polyps    HTN (hypertension)    Hyperlipidemia    Kidney stones    Pancreatitis    Pneumonia    Primary hypertension 10/20/2021   Psychotic depression ([place])    Shingles February 26th, 2001   Past Surgical History:  Procedure Laterality Date   ABDOMINAL HYSTERECTOMY     CESAREAN SECTION     COLONOSCOPY  05/02/2017   Colonic polyp status post polypectomy. Small internal hemorrhoids   GALLBLADDER SURGERY     REPLACEMENT TOTAL KNEE Right 02/09/2019   TOTAL KNEE ARTHROPLASTY  02/09/2019   TUBAL LIGATION      Family History  Problem Relation Age of Onset   COPD Mother    Parkinson's disease Mother    COPD Father    Arthritis Father    Lung disease Father    Colon cancer Cousin        mother's cousin   Breast cancer Maternal Aunt        great aunt   Diabetes Other    Stroke Other    Hypertension Other    Hyperlipidemia Other    Asthma Other    Heart failure Other    Thyroid disease Other    Heart attack Other    COPD Other    Arrhythmia Other    Arthritis Other    Migraines Other    Social History   Socioeconomic History   Marital status: Widowed    Spouse name: Not on file   Number of children: 2   Years of education: Not on file   Highest education level: High school graduate  Occupational History   Not on file  Tobacco Use   Smoking status: Every Day    Packs/day: 1.00    Years: 20.00    Total pack years: 20.00    Types: Cigarettes   Smokeless  tobacco: Never  Vaping Use   Vaping Use: Never used  Substance and Sexual Activity   Alcohol use: Yes    Comment: occas   Drug use: No  Sexual activity: Not on file  Other Topics Concern   Not on file  Social History Narrative   Not on file   Social Determinants of Health   Financial Resource Strain: Not on file  Food Insecurity: Not on file  Transportation Needs: Not on file  Physical Activity: Not on file  Stress: Not on file  Social Connections: Not on file    Review of Systems  Constitutional:  Positive for fatigue. Negative for chills and fever.  Skin:        Dental abscess   Neurological:  Positive for light-headedness. Negative for headaches.     Objective:  BP 116/70   Pulse 88   Temp (!) 97.3 F (36.3 C)   Resp 18   Ht '5\' 6"'$  (1.676 m)   Wt 163 lb (73.9 kg)   LMP  (LMP Unknown) Comment: hysterectomy 2008  BMI 26.31 kg/m      06/17/2022    3:43 PM 06/13/2022    6:00 PM 06/13/2022    5:30 PM  BP/Weight  Systolic BP 161 096 045  Diastolic BP 70 69 65  Wt. (Lbs) 163    BMI 26.31 kg/m2      Physical Exam Vitals reviewed.  Constitutional:      Appearance: Normal appearance.  Cardiovascular:     Rate and Rhythm: Normal rate and regular rhythm.     Heart sounds: Normal heart sounds.  Pulmonary:     Effort: Pulmonary effort is normal.     Breath sounds: Normal breath sounds.  Skin:    Findings: Lesion (Right cheek: Mildly indurated.  No fluctuance.  Small scab where was I&D.  Do not see any cultures.  Based on photographs and pictures report she has significantly improved.) present.  Neurological:     Mental Status: She is alert.     Diabetic Foot Exam - Simple   No data filed      Lab Results  Component Value Date   WBC 9.3 06/13/2022   HGB 13.4 06/13/2022   HCT 39.4 06/13/2022   PLT 387 06/13/2022   GLUCOSE 98 06/13/2022   CHOL 212 (H) 05/06/2022   TRIG 180 (H) 05/06/2022   HDL 45 05/06/2022   LDLCALC 135 (H) 05/06/2022   ALT 21  06/06/2022   AST 14 (L) 06/06/2022   NA 137 06/13/2022   K 4.1 06/13/2022   CL 100 06/13/2022   CREATININE 0.50 06/13/2022   BUN 8 06/13/2022   CO2 29 06/13/2022   TSH 3.120 10/20/2021   INR 0.9 03/25/2022   HGBA1C 5.3 01/22/2022      Assessment & Plan:   Problem List Items Addressed This Visit       Digestive   Dental abscess    Complete Augmentin.        Other   Cellulitis, face - Primary    Complete Augmentin.  If patient worsens, I recommended she call and we will refer her to infectious disease.  At this point I do not believe it is necessary for her to see them.     .  Follow-up: Return if symptoms worsen or fail to improve.  An After Visit Summary was printed and given to the patient.  Rochel Brome, MD [facility] [phone]

## 2022-06-19 ENCOUNTER — Encounter: Payer: Self-pay | Admitting: Family Medicine

## 2022-06-19 NOTE — Assessment & Plan Note (Signed)
Complete Augmentin.  If patient worsens, I recommended she call and we will refer her to infectious disease.  At this point I do not believe it is necessary for her to see them.

## 2022-06-19 NOTE — Assessment & Plan Note (Signed)
Complete Augmentin.

## 2022-06-20 ENCOUNTER — Other Ambulatory Visit: Payer: Self-pay | Admitting: Family Medicine

## 2022-06-24 ENCOUNTER — Encounter: Payer: Self-pay | Admitting: Internal Medicine

## 2022-06-24 ENCOUNTER — Other Ambulatory Visit: Payer: Self-pay

## 2022-06-24 ENCOUNTER — Ambulatory Visit (INDEPENDENT_AMBULATORY_CARE_PROVIDER_SITE_OTHER): Payer: Self-pay | Admitting: Internal Medicine

## 2022-06-24 VITALS — BP 116/75 | HR 71 | Temp 98.2°F | Ht 66.0 in | Wt 164.0 lb

## 2022-06-24 DIAGNOSIS — M2762 Post-osseointegration biological failure of dental implant: Secondary | ICD-10-CM

## 2022-06-24 NOTE — Progress Notes (Signed)
Patient: [name]  DOB: [birth year] MRN: [id number] PCP: Rochel Brome, MD   Chief Complaint  Patient presents with   Follow-up    Two doses left of Amoxicillin/ Concerns for flare up      Patient Active Problem List   Diagnosis Date Noted   Hypokalemia 06/09/2022   Dental abscess 06/09/2022   Tobacco abuse 06/09/2022   Cellulitis, face 06/08/2022   Aortic atherosclerosis ([place]) 05/08/2022   Chronic midline low back pain without sciatica 01/22/2022   Myalgia due to statin 01/22/2022   GERD (gastroesophageal reflux disease) 01/22/2022   Overweight with body mass index (BMI) of 28 to 28.9 in adult 12/31/2021   Screening for lung cancer 12/31/2021   Screening mammogram, encounter for 12/23/2021   COPD exacerbation ([place]) 11/22/2021   Right otitis media 11/13/2021   Other acute recurrent sinusitis 10/20/2021   Prediabetes 10/20/2021   Migraine 10/20/2021   Moderate persistent asthma without complication/COPD 21/19/4174   Primary hypertension 10/20/2021   DDD (degenerative disc disease), lumbar 06/17/2020   Neuropathy 04/08/2020   Simple chronic bronchitis ([place]) 01/06/2020   Left lower quadrant abdominal pain 01/01/2020   Other fatigue 01/01/2020   Cigarette nicotine dependence with nicotine-induced disorder 01/01/2020   Mixed hyperlipidemia 01/01/2020   GAD (generalized anxiety disorder) 01/01/2020   Smoking 06/05/2019   Prinzmetal angina ([place]) 06/05/2019   Primary osteoarthritis of one knee, right 02/09/2019   Pain of left hip joint 12/19/2018   Osteoarthritis of carpometacarpal [facility]) joint of thumb 11/20/2018     Subjective:  [name] is a 56 y.o. F with PMHx as below presents for management of dental implant infection. Pt notes implants were placed over course of 12 years , last one was on May 23rd, 2023. She follows with Dentistry, Dr. Iran Planas at [facility]. She was [facility] to [facility] 9/19-9/20 facial swelling.   Noted to have dental  implants removed on 9/5, placed on penicillin.  Since then she has been to urgent care for Rocephin/steroids then placed on Augmentin.  On 9/16 she started having right-sided facial pain.  She was seen in the emergency department on 9/17 for right facial swelling and pain.CT at that time did not show abscess.  Sent home on pain meds and continued Augmentin..  Return to the ED on 9/19 with swelling of right face involving to the eye area.  CT showed progressive right-sided facial and periorbital inflammatory changes compatible with cellulitis.  Superimposed 2.3 cm gas-containing focus in the right nasolabial fold patible with abscess.  She was admitted and  started on vancomycin and PIP Tazo.  ENT was consulted, recommended dentistry follow-up following discharge.  She was discharged on doxycycline and Augmentin x2 weeks.  Home 9/20 after discharge pulled out implants from maxilla, two top left implants left.  Presented to back ED on 9/24 for continued swelling and pain.  CT on 9/24 showed progressive soft tissue swelling and enhancement centered about right maxilla at the more laterally resected implant.  No drainable abscess.  She was given a dose of dalbavancin and noted to continue Augmentin for anaerobic coverage.She  completed 2 weeks of antibioics Saturday(9/30).  On Sunday evening, she reports eye"looked sick" She restarted augmentin. She has been on antibioicssince discharge. She states she never took any of the doxy. Today: Pain and swelling have improved. She is taking Augmentin and follows with dentistry.  Review of Systems  All other systems reviewed and are negative.   Past Medical History:  Diagnosis Date   Allergic rhinitis    Arthritis    Blood transfusion without reported diagnosis    COPD (chronic obstructive pulmonary disease) (HCC)    GERD (gastroesophageal reflux disease)    History of colon polyps    HTN (hypertension)    Hyperlipidemia    Kidney stones    Pancreatitis     Pneumonia    Primary hypertension 10/20/2021   Psychotic depression ([place])    Shingles February 26th, 2001    Outpatient Medications Prior to Visit  Medication Sig Dispense Refill   acetaminophen (TYLENOL) 325 MG tablet Take 2 tablets (650 mg total) by mouth every 6 (six) hours as needed for mild pain (or Fever >/= 101). 20 tablet 0   albuterol (VENTOLIN HFA) 108 (90 Base) MCG/ACT inhaler Inhale 2 puffs into the lungs every 6 (six) hours as needed.     ALPRAZolam (XANAX) 0.25 MG tablet TAKE 1 TABLET(0.25 MG) BY MOUTH DAILY AS NEEDED FOR ANXIETY 30 tablet 2   amLODipine (NORVASC) 10 MG tablet TAKE 1 TABLET(10 MG) BY MOUTH DAILY Strength: 10 mg 90 tablet 1   amoxicillin-clavulanate (AUGMENTIN) 875-125 MG tablet Take 1 tablet by mouth 2 (two) times daily. 28 tablet 0   chlorhexidine (PERIDEX) 0.12 % solution SMARTSIG:By Mouth     cyclobenzaprine (FLEXERIL) 10 MG tablet Take 5 mg by mouth 3 (three) times daily as needed for muscle spasms.     dicyclomine (BENTYL) 10 MG capsule TAKE 1 CAPSULE(10 MG) BY MOUTH FOUR TIMES DAILY BEFORE MEALS AND AT BEDTIME 120 capsule 2   famotidine (PEPCID) 40 MG tablet TAKE 1 TABLET(40 MG) BY MOUTH DAILY 90 tablet 1   fluconazole (DIFLUCAN) 150 MG tablet Take 1 tablet (150 mg total) by mouth daily. 2 tablet 0   fluticasone (FLONASE) 50 MCG/ACT nasal spray SHAKE LIQUID AND USE 2 SPRAYS IN EACH NOSTRIL DAILY 16 g 6   fluticasone-salmeterol (ADVAIR HFA) 115-21 MCG/ACT inhaler Inhale 2 puffs into the lungs 2 (two) times daily. 1 each 12   furosemide (LASIX) 20 MG tablet Take 1 tablet (20 mg total) by mouth daily as needed for edema. 1 tablet 0   gabapentin (NEURONTIN) 300 MG capsule TAKE 1 CAPSULE(300 MG) BY MOUTH THREE TIMES DAILY 90 capsule 1   HYDROcodone-acetaminophen (NORCO/VICODIN) 5-325 MG tablet Take 1 tablet by mouth every 6 (six) hours as needed for severe pain or moderate pain. 10 tablet 0   montelukast (SINGULAIR) 10 MG tablet TAKE 1 TABLET(10 MG) BY MOUTH  DAILY 90 tablet 0   nitroGLYCERIN (NITROSTAT) 0.4 MG SL tablet DISSOLVE 1 TABLET BY MOUTH UNDER THE TONGUE AS NEEDED FOR CHEST PAIN EVERY 5 MINUTES UP TO 3 TIMES 25 tablet 0   ondansetron (ZOFRAN) 4 MG tablet Take 1 tablet (4 mg total) by mouth every 6 (six) hours as needed for nausea. 20 tablet 0   OZEMPIC, 2 MG/DOSE, 8 MG/3ML SOPN INJECT 2 MG AS DIRECTED ONCE A WEEK 9 mL 0   triamcinolone (KENALOG) 0.025 % ointment Apply 1 application. topically 2 (two) times daily. Apply to corners of mouth twice daily 30 g 0   vitamin E 180 MG (400 UNITS) capsule Take by mouth.     vitamin k 100 MCG tablet Take 100 mcg by mouth daily.     No facility-administered medications prior to visit.     Allergies  Allergen Reactions   Levofloxacin Other (See Comments) and Rash    Rash all over redness    Bactrim Ds [Sulfamethoxazole-Trimethoprim]  Nausea Only   Levaquin [Levofloxacin In D5w]    Lipitor [Atorvastatin] Other (See Comments)    Myalgia    Tape Hives   Zetia [Ezetimibe]     Muscle pain   Codeine Nausea Only   Other Rash    bandaids leave a red area if left on too long    Social History   Tobacco Use   Smoking status: Every Day    Packs/day: 1.00    Years: 20.00    Total pack years: 20.00    Types: Cigarettes   Smokeless tobacco: Never  Vaping Use   Vaping Use: Never used  Substance Use Topics   Alcohol use: Yes    Comment: occas   Drug use: No    Family History  Problem Relation Age of Onset   COPD Mother    Parkinson's disease Mother    COPD Father    Arthritis Father    Lung disease Father    Colon cancer Cousin        mother's cousin   Breast cancer Maternal Aunt        great aunt   Diabetes Other    Stroke Other    Hypertension Other    Hyperlipidemia Other    Asthma Other    Heart failure Other    Thyroid disease Other    Heart attack Other    COPD Other    Arrhythmia Other    Arthritis Other    Migraines Other     Objective:   Vitals:   06/24/22  1417  BP: 116/75  Pulse: 71  Temp: 98.2 F (36.8 C)  TempSrc: Oral  SpO2: 97%  Weight: 164 lb (74.4 kg)  Height: '5\' 6"'$  (1.676 m)   Body mass index is 26.47 kg/m.  Physical Exam Constitutional:      Appearance: Normal appearance.  HENT:     Head: Normocephalic and atraumatic.     Right Ear: Tympanic membrane normal.     Left Ear: Tympanic membrane normal.     Nose: Nose normal.     Mouth/Throat:     Mouth: Mucous membranes are moist.  Eyes:     Extraocular Movements: Extraocular movements intact.     Conjunctiva/sclera: Conjunctivae normal.     Pupils: Pupils are equal, round, and reactive to light.  Cardiovascular:     Rate and Rhythm: Normal rate and regular rhythm.     Heart sounds: No murmur heard.    No friction rub. No gallop.  Pulmonary:     Effort: Pulmonary effort is normal.     Breath sounds: Normal breath sounds.  Abdominal:     General: Abdomen is flat.     Palpations: Abdomen is soft.  Musculoskeletal:        General: Normal range of motion.  Skin:    General: Skin is warm and dry.  Neurological:     General: No focal deficit present.     Mental Status: She is alert and oriented to person, place, and time.  Psychiatric:        Mood and Affect: Mood normal.          Lab Results: Lab Results  Component Value Date   WBC 9.3 06/13/2022   HGB 13.4 06/13/2022   HCT 39.4 06/13/2022   MCV 95.6 06/13/2022   PLT 387 06/13/2022    Lab Results  Component Value Date   CREATININE 0.50 06/13/2022   BUN 8 06/13/2022   NA 137 06/13/2022  K 4.1 06/13/2022   CL 100 06/13/2022   CO2 29 06/13/2022    Lab Results  Component Value Date   ALT 21 06/06/2022   AST 14 (L) 06/06/2022   ALKPHOS 86 06/06/2022   BILITOT 0.3 06/06/2022     Assessment & Plan:  #Facial cellulitis/orbital cellulitis associated with dental implants -From her implant extraction on 9/5 she has been on and off antibiotics -Admitted to [facility] 9/19-20 for facial cellulitis with  abscess at nasolabial fold. She received vancomycin + pip-tazo discharged on Augmentin+ doxy(did not take doxy) x 2 weeks. ENT was consulted during this hospitalization and recommend f/u with dentistry following discharge. 9/20 MRSA nares negative.  -She  had additional implants extracted following discharge on 9/20. -Came back to ED on 9/24 for continued swelling and pain.  CT on 9/24 showed progressive soft tissue swelling and enhancement centered about right maxilla at the more laterally resected implant.  No drainable abscess.  She was given a dose of dalbavancin and noted to continue Augmentin for anaerobic coverage. Since that time she has continued Augmentin(was due to stop this weekend but continued it due to though she saw more redness at nasolabial site) Plan: -Labs today -Stop antibiotics-augmentin (as she has completed almost 3 weeks of consecutive antibiotics). Clinically her cellulitis looks improved. I counseled pt that the discoloration may take a few weeks to improve.  -Follow-up with ID in one month for clinical monitoring.   I have personally spent 75 minutes involved in face-to-face and non-face-to-face activities for this patient on the day of the visit. Professional time spent includes the following activities: Preparing to see the patient (review of tests), Obtaining and/or reviewing separately obtained history (admission/discharge record), Performing a medically appropriate examination and/or evaluation , Ordering medications/tests/procedures, referring and communicating with other health care professionals, Documenting clinical information in the EMR, Independently interpreting results (not separately reported), Communicating results to the patient/family/caregiver, Counseling and educating the patient/family/caregiver and Care coordination (not separately reported).   Laurice Record, [facility]   06/24/22  2:38 PM

## 2022-06-25 LAB — CBC WITH DIFFERENTIAL/PLATELET
Absolute Monocytes: 432 cells/uL (ref 200–950)
Basophils Absolute: 48 cells/uL (ref 0–200)
Basophils Relative: 0.6 %
Eosinophils Absolute: 168 cells/uL (ref 15–500)
Eosinophils Relative: 2.1 %
HCT: 40.9 % (ref 35.0–45.0)
Hemoglobin: 14.2 g/dL (ref 11.7–15.5)
Lymphs Abs: 2528 cells/uL (ref 850–3900)
MCH: 33.3 pg — ABNORMAL HIGH (ref 27.0–33.0)
MCHC: 34.7 g/dL (ref 32.0–36.0)
MCV: 95.8 fL (ref 80.0–100.0)
MPV: 9.3 fL (ref 7.5–12.5)
Monocytes Relative: 5.4 %
Neutro Abs: 4824 cells/uL (ref 1500–7800)
Neutrophils Relative %: 60.3 %
Platelets: 362 10*3/uL (ref 140–400)
RBC: 4.27 10*6/uL (ref 3.80–5.10)
RDW: 11.8 % (ref 11.0–15.0)
Total Lymphocyte: 31.6 %
WBC: 8 10*3/uL (ref 3.8–10.8)

## 2022-06-25 LAB — SEDIMENTATION RATE: Sed Rate: 9 mm/h (ref 0–30)

## 2022-06-25 LAB — COMPLETE METABOLIC PANEL WITH GFR
AG Ratio: 1.7 (calc) (ref 1.0–2.5)
ALT: 28 U/L (ref 6–29)
AST: 17 U/L (ref 10–35)
Albumin: 4.5 g/dL (ref 3.6–5.1)
Alkaline phosphatase (APISO): 78 U/L (ref 37–153)
BUN: 9 mg/dL (ref 7–25)
CO2: 29 mmol/L (ref 20–32)
Calcium: 9.9 mg/dL (ref 8.6–10.4)
Chloride: 100 mmol/L (ref 98–110)
Creat: 0.58 mg/dL (ref 0.50–1.03)
Globulin: 2.6 g/dL (calc) (ref 1.9–3.7)
Glucose, Bld: 80 mg/dL (ref 65–99)
Potassium: 4.1 mmol/L (ref 3.5–5.3)
Sodium: 138 mmol/L (ref 135–146)
Total Bilirubin: 0.3 mg/dL (ref 0.2–1.2)
Total Protein: 7.1 g/dL (ref 6.1–8.1)
eGFR: 106 mL/min/{1.73_m2} (ref >=60)

## 2022-06-25 LAB — C-REACTIVE PROTEIN: CRP: 1 mg/L (ref <8.0)

## 2022-07-06 ENCOUNTER — Other Ambulatory Visit (HOSPITAL_COMMUNITY): Payer: Self-pay

## 2022-07-16 ENCOUNTER — Encounter: Payer: Self-pay | Admitting: Gastroenterology

## 2022-07-18 ENCOUNTER — Other Ambulatory Visit: Payer: Self-pay | Admitting: Nurse Practitioner

## 2022-07-18 DIAGNOSIS — J309 Allergic rhinitis, unspecified: Secondary | ICD-10-CM

## 2022-07-30 ENCOUNTER — Other Ambulatory Visit: Payer: Self-pay

## 2022-07-30 MED ORDER — CYCLOBENZAPRINE HCL 10 MG PO TABS: 5.0000 mg | ORAL_TABLET | Freq: Three times a day (TID) | ORAL | 0 refills | Status: DC | PRN

## 2022-08-09 ENCOUNTER — Ambulatory Visit: Payer: Self-pay | Admitting: Internal Medicine

## 2022-08-11 ENCOUNTER — Telehealth: Payer: Self-pay

## 2022-08-11 NOTE — Telephone Encounter (Signed)
[name],   Hey this patient has had Ozempic patient assistance to [employer] filled out 2 weeks ago and faxed and she called the patient assistance place and they stated that the patient's paper work has not been faxed. I went downstairs and looked in your stacks of paperwork and did not see this. Can you help me with this because I was going to just re-fax it but I can't find it!   Thank you!

## 2022-08-16 ENCOUNTER — Other Ambulatory Visit: Payer: Self-pay

## 2022-08-18 ENCOUNTER — Other Ambulatory Visit: Payer: Self-pay

## 2022-08-18 MED ORDER — DICYCLOMINE HCL 10 MG PO CAPS: ORAL_CAPSULE | ORAL | 2 refills | Status: DC

## 2022-08-23 ENCOUNTER — Telehealth: Payer: Self-pay | Admitting: Family Medicine

## 2022-08-23 NOTE — Telephone Encounter (Signed)
Pt came into the office for a flu shot and to update her insurance. She now has [place]. She is aware she will have to get her flu shot at the health department.   Also, asked about her patience assistance if it was re-sent. Last note was from [place], cma to tamla about locating her forms.

## 2022-08-24 ENCOUNTER — Other Ambulatory Visit: Payer: Self-pay

## 2022-08-24 NOTE — Telephone Encounter (Signed)
Yes Lauren resent forms. Thank you  Pattricia Boss, [place] Pharmacist Assistant [phone]

## 2022-08-27 DIAGNOSIS — M7062 Trochanteric bursitis, left hip: Secondary | ICD-10-CM | POA: Diagnosis not present

## 2022-08-27 DIAGNOSIS — M25552 Pain in left hip: Secondary | ICD-10-CM | POA: Diagnosis not present

## 2022-09-09 ENCOUNTER — Other Ambulatory Visit: Payer: Self-pay | Admitting: Family Medicine

## 2022-09-09 ENCOUNTER — Telehealth: Payer: Self-pay

## 2022-09-09 ENCOUNTER — Encounter: Payer: Self-pay | Admitting: Family Medicine

## 2022-09-09 ENCOUNTER — Ambulatory Visit (INDEPENDENT_AMBULATORY_CARE_PROVIDER_SITE_OTHER): Payer: Medicaid Other | Admitting: Family Medicine

## 2022-09-09 VITALS — BP 122/82 | HR 93 | Temp 98.5°F | Resp 14 | Ht 66.0 in | Wt 163.0 lb

## 2022-09-09 DIAGNOSIS — E782 Mixed hyperlipidemia: Secondary | ICD-10-CM

## 2022-09-09 DIAGNOSIS — J4540 Moderate persistent asthma, uncomplicated: Secondary | ICD-10-CM

## 2022-09-09 DIAGNOSIS — J0181 Other acute recurrent sinusitis: Secondary | ICD-10-CM

## 2022-09-09 DIAGNOSIS — M545 Low back pain, unspecified: Secondary | ICD-10-CM

## 2022-09-09 DIAGNOSIS — M791 Myalgia, unspecified site: Secondary | ICD-10-CM | POA: Diagnosis not present

## 2022-09-09 DIAGNOSIS — I700 Atherosclerosis of aorta: Secondary | ICD-10-CM

## 2022-09-09 DIAGNOSIS — F411 Generalized anxiety disorder: Secondary | ICD-10-CM | POA: Diagnosis not present

## 2022-09-09 DIAGNOSIS — J441 Chronic obstructive pulmonary disease with (acute) exacerbation: Secondary | ICD-10-CM | POA: Diagnosis not present

## 2022-09-09 DIAGNOSIS — J410 Simple chronic bronchitis: Secondary | ICD-10-CM | POA: Diagnosis not present

## 2022-09-09 DIAGNOSIS — R7303 Prediabetes: Secondary | ICD-10-CM | POA: Diagnosis not present

## 2022-09-09 DIAGNOSIS — Z1211 Encounter for screening for malignant neoplasm of colon: Secondary | ICD-10-CM | POA: Diagnosis not present

## 2022-09-09 DIAGNOSIS — T466X5A Adverse effect of antihyperlipidemic and antiarteriosclerotic drugs, initial encounter: Secondary | ICD-10-CM

## 2022-09-09 DIAGNOSIS — F17219 Nicotine dependence, cigarettes, with unspecified nicotine-induced disorders: Secondary | ICD-10-CM | POA: Diagnosis not present

## 2022-09-09 DIAGNOSIS — I10 Essential (primary) hypertension: Secondary | ICD-10-CM | POA: Diagnosis not present

## 2022-09-09 DIAGNOSIS — K219 Gastro-esophageal reflux disease without esophagitis: Secondary | ICD-10-CM | POA: Diagnosis not present

## 2022-09-09 MED ORDER — OMEPRAZOLE 40 MG PO CPDR: 40.0000 mg | DELAYED_RELEASE_CAPSULE | Freq: Every day | ORAL | 3 refills | Status: DC

## 2022-09-09 MED ORDER — FLUTICASONE-SALMETEROL 115-21 MCG/ACT IN AERO: 2.0000 | INHALATION_SPRAY | Freq: Two times a day (BID) | RESPIRATORY_TRACT | 3 refills | Status: DC

## 2022-09-09 MED ORDER — NICOTINE 21 MG/24HR TD PT24: 21.0000 mg | MEDICATED_PATCH | Freq: Every day | TRANSDERMAL | 0 refills | Status: DC

## 2022-09-09 MED ORDER — PREGABALIN 75 MG PO CAPS: 75.0000 mg | ORAL_CAPSULE | Freq: Two times a day (BID) | ORAL | 3 refills | Status: DC

## 2022-09-09 MED ORDER — ALPRAZOLAM 0.25 MG PO TABS: ORAL_TABLET | ORAL | 2 refills | Status: DC

## 2022-09-09 MED ORDER — CYCLOBENZAPRINE HCL 10 MG PO TABS: 10.0000 mg | ORAL_TABLET | Freq: Three times a day (TID) | ORAL | 0 refills | Status: DC | PRN

## 2022-09-09 MED ORDER — AMOXICILLIN-POT CLAVULANATE 875-125 MG PO TABS: 1.0000 | ORAL_TABLET | Freq: Two times a day (BID) | ORAL | 0 refills | Status: DC

## 2022-09-09 NOTE — Telephone Encounter (Signed)
After patient left this morning she had forgotten to ask you about the purple and red spots on her arms. She stated she went to the dermatologist and they just recommended her to go Stop Ibuprophen and that was it. She is wanting to know if you could recommend something for her to put on it to help lighten it up. Tried: Witch hazel/olay

## 2022-09-09 NOTE — Progress Notes (Signed)
Subjective:  Patient ID: [name], female    DOB: 10/04/1965  Age: 56 y.o. MRN: [id number]  Chief Complaint  Patient presents with   Coronary Artery Disease   Hyperlipidemia   Prediabetes    HPI Hyperlipidemia/Aortic atherosclerosis:  ON no cholesterol medicines. Intolerant to lipitor and zetia.   Hypertension: On amlodipine 10 mg daily, furosemide 20 mg as needed (rarely takes).  GERD: Pepcid 40 mg daily. Has heartburn all the time.  Prediabetes; a1c 5.3. Eating healthy.  On ozempic 2 mg weekly. Has helped with weight loss of 25 lbs.   Patient has not been approved for disability yet, but is now on medicaid. Chronic back pain: on gabapentin 300 mg before bed as needed. Cyclobenzaprine 5 mg three times a day, but does not help. .   Asthma: Albuterol hfa 2 puffs four times a day as needed shortness of breath. On Advair 2 puffs twice one daily. On singulair.   Tobacco disorder: requesting Nicoderm patches. Smoking 1.5 ppd.   Current Outpatient Medications on File Prior to Visit  Medication Sig Dispense Refill   acetaminophen (TYLENOL) 325 MG tablet Take 2 tablets (650 mg total) by mouth every 6 (six) hours as needed for mild pain (or Fever >/= 101). 20 tablet 0   albuterol (VENTOLIN HFA) 108 (90 Base) MCG/ACT inhaler Inhale 2 puffs into the lungs every 6 (six) hours as needed.     amLODipine (NORVASC) 10 MG tablet TAKE 1 TABLET(10 MG) BY MOUTH DAILY Strength: 10 mg 90 tablet 1   chlorhexidine (PERIDEX) 0.12 % solution SMARTSIG:By Mouth     dicyclomine (BENTYL) 10 MG capsule TAKE 1 CAPSULE(10 MG) BY MOUTH FOUR TIMES DAILY BEFORE MEALS AND AT BEDTIME 120 capsule 2   Docusate Sodium (DSS) 100 MG CAPS Take by mouth.     famotidine (PEPCID) 40 MG tablet TAKE 1 TABLET(40 MG) BY MOUTH DAILY 90 tablet 1   fluticasone (FLONASE) 50 MCG/ACT nasal spray SHAKE LIQUID AND USE 2 SPRAYS IN EACH NOSTRIL DAILY 16 g 6   furosemide (LASIX) 20 MG tablet Take 1 tablet (20 mg total) by mouth daily  as needed for edema. 1 tablet 0   meloxicam (MOBIC) 15 MG tablet Take 15 mg by mouth daily.     montelukast (SINGULAIR) 10 MG tablet TAKE 1 TABLET(10 MG) BY MOUTH AT BEDTIME 90 tablet 0   nitroGLYCERIN (NITROSTAT) 0.4 MG SL tablet DISSOLVE 1 TABLET BY MOUTH UNDER THE TONGUE AS NEEDED FOR CHEST PAIN EVERY 5 MINUTES UP TO 3 TIMES 25 tablet 0   OZEMPIC, 2 MG/DOSE, 8 MG/3ML SOPN INJECT 2 MG AS DIRECTED ONCE A WEEK 9 mL 0   triamcinolone (KENALOG) 0.025 % ointment Apply 1 application. topically 2 (two) times daily. Apply to corners of mouth twice daily 30 g 0   Vitamin E (VITAMIN E/D-ALPHA NATURAL) 268 MG (400 UNIT) CAPS Take 400 Units by mouth daily.     vitamin E 180 MG (400 UNITS) capsule Take by mouth.     vitamin k 100 MCG tablet Take 100 mcg by mouth daily.     No current facility-administered medications on file prior to visit.   Past Medical History:  Diagnosis Date   Allergic rhinitis    Arthritis    Blood transfusion without reported diagnosis    Cellulitis, face 06/08/2022   COPD (chronic obstructive pulmonary disease) ([place])    Dental abscess 06/09/2022   GERD (gastroesophageal reflux disease)    History of colon polyps  HTN (hypertension)    Hyperlipidemia    Kidney stones    Pancreatitis    Pneumonia    Primary hypertension 10/20/2021   Psychotic depression [facility])    Shingles February 26th, 2001   Past Surgical History:  Procedure Laterality Date   ABDOMINAL HYSTERECTOMY     CESAREAN SECTION     COLONOSCOPY  05/02/2017   Colonic polyp status post polypectomy. Small internal hemorrhoids   GALLBLADDER SURGERY     REPLACEMENT TOTAL KNEE Right 02/09/2019   TOTAL KNEE ARTHROPLASTY  02/09/2019   TUBAL LIGATION      Family History  Problem Relation Age of Onset   COPD Mother    Parkinson's disease Mother    COPD Father    Arthritis Father    Lung disease Father    Colon cancer Cousin        mother's cousin   Breast cancer Maternal Aunt        great aunt    Diabetes Other    Stroke Other    Hypertension Other    Hyperlipidemia Other    Asthma Other    Heart failure Other    Thyroid disease Other    Heart attack Other    COPD Other    Arrhythmia Other    Arthritis Other    Migraines Other    Social History   Socioeconomic History   Marital status: Widowed    Spouse name: Not on file   Number of children: 2   Years of education: Not on file   Highest education level: High school graduate  Occupational History   Not on file  Tobacco Use   Smoking status: Every Day    Packs/day: 1.50    Years: 20.00    Total pack years: 30.00    Types: Cigarettes   Smokeless tobacco: Never  Vaping Use   Vaping Use: Never used  Substance and Sexual Activity   Alcohol use: Yes    Comment: occas   Drug use: No   Sexual activity: Not Currently  Other Topics Concern   Not on file  Social History Narrative   Not on file   Social Determinants of Health   Financial Resource Strain: Not on file  Food Insecurity: Not on file  Transportation Needs: Not on file  Physical Activity: Not on file  Stress: Not on file  Social Connections: Not on file    Review of Systems  Constitutional:  Negative for chills, fatigue and fever.  HENT:  Positive for sinus pressure and sinus pain. Negative for congestion, ear pain and sore throat.   Respiratory:  Negative for cough and shortness of breath.   Cardiovascular:  Negative for chest pain.  Gastrointestinal:  Negative for abdominal pain, constipation, diarrhea, nausea and vomiting.  Genitourinary:  Negative for dysuria and urgency.  Musculoskeletal:  Negative for arthralgias and myalgias.  Skin:  Negative for rash.  Neurological:  Negative for dizziness and headaches.  Psychiatric/Behavioral:  Negative for dysphoric mood. The patient is not nervous/anxious.      Objective:  BP 122/82   Pulse 93   Temp 98.5 F (36.9 C)   Resp 14   Ht '5\' 6"'$  (1.676 m)   Wt 163 lb (73.9 kg)   LMP  (LMP Unknown)  Comment: hysterectomy 2008  SpO2 96%   BMI 26.31 kg/m      09/09/2022    8:34 AM 06/24/2022    2:17 PM 06/17/2022    3:43 PM  BP/Weight  Systolic BP 470 962 836  Diastolic BP 82 75 70  Wt. (Lbs) 163 164 163  BMI 26.31 kg/m2 26.47 kg/m2 26.31 kg/m2    Physical Exam Vitals reviewed.  Constitutional:      Appearance: Normal appearance. She is normal weight.  HENT:     Right Ear: Tympanic membrane normal.     Left Ear: Tympanic membrane normal.     Nose: Congestion present.     Comments: erythema    Mouth/Throat:     Pharynx: Posterior oropharyngeal erythema present.  Neck:     Vascular: No carotid bruit.  Cardiovascular:     Rate and Rhythm: Normal rate and regular rhythm.     Heart sounds: Normal heart sounds.  Pulmonary:     Effort: Pulmonary effort is normal. No respiratory distress.     Breath sounds: Normal breath sounds.  Lymphadenopathy:     Cervical: No cervical adenopathy.  Neurological:     Mental Status: She is alert and oriented to person, place, and time.  Psychiatric:        Mood and Affect: Mood normal.        Behavior: Behavior normal.     Diabetic Foot Exam - Simple   No data filed      Lab Results  Component Value Date   WBC 11.7 (H) 09/09/2022   HGB 16.2 (H) 09/09/2022   HCT 47.2 (H) 09/09/2022   PLT 350 09/09/2022   GLUCOSE 91 09/09/2022   CHOL 291 (H) 09/09/2022   TRIG 173 (H) 09/09/2022   HDL 58 09/09/2022   LDLCALC 201 (H) 09/09/2022   ALT 20 09/09/2022   AST 19 09/09/2022   NA 138 09/09/2022   K 4.3 09/09/2022   CL 98 09/09/2022   CREATININE 0.57 09/09/2022   BUN 11 09/09/2022   CO2 26 09/09/2022   TSH 1.690 09/09/2022   INR 0.9 03/25/2022   HGBA1C 5.4 09/09/2022      Assessment & Plan:   Problem List Items Addressed This Visit       Cardiovascular and Mediastinum   Primary hypertension    Well controlled.  No changes to medicines. Continue On amlodipine 10 mg daily, furosemide 20 mg as needed (rarely  takes). Continue to work on eating a healthy diet and exercise.  Labs drawn today.        Relevant Orders   CBC with Differential/Platelet (Completed)   Comprehensive metabolic panel (Completed)   Aortic atherosclerosis ([place]) - Primary    Not at goal.  Intolerant to statins.  Recommend repatha.  Continue to work on eating a healthy diet and exercise.  Labs drawn today.        Relevant Orders   Lipid panel (Completed)   Cardiovascular Risk Assessment (Completed)     Respiratory   Simple chronic bronchitis (HCC)    Continue advair 2 puffs twice daily.       Other acute recurrent sinusitis    Prescription: augmentin.      Relevant Medications   amoxicillin-clavulanate (AUGMENTIN) 875-125 MG tablet   Moderate persistent asthma without complication/COPD    Continue Albuterol hfa 2 puffs four times a day as needed shortness of breath. On Advair 2 puffs twice one daily. On singulair.       Relevant Medications   fluticasone-salmeterol (ADVAIR HFA) 115-21 MCG/ACT inhaler   COPD exacerbation (HCC)   Relevant Medications   fluticasone-salmeterol (ADVAIR HFA) 115-21 MCG/ACT inhaler   nicotine (NICODERM CQ) 21 mg/24hr patch  Digestive   GERD (gastroesophageal reflux disease)    Start on omeprazole 40 mg daily. Continue pepcid 40 mg daily.       Relevant Medications   Docusate Sodium (DSS) 100 MG CAPS   omeprazole (PRILOSEC) 40 MG capsule     Nervous and Auditory   Cigarette nicotine dependence with nicotine-induced disorder    Start nicoderm patches.       Relevant Medications   nicotine (NICODERM CQ) 21 mg/24hr patch     Other   Mixed hyperlipidemia    Recommend repatha.  Intolerant to lipitor and zetia.  Recommend continue to work on eating healthy diet and exercise. Check labs.       Relevant Orders   Lipid panel (Completed)   TSH (Completed)   GAD (generalized anxiety disorder)    Currently on no medicines.      Relevant Medications   ALPRAZolam  (XANAX) 0.25 MG tablet   Prediabetes    Recommend continue to work on eating healthy diet and exercise.       Relevant Orders   Hemoglobin A1c (Completed)   Myalgia due to statin    Intolerant to statins.       Colon cancer screening   Relevant Orders   Ambulatory referral to Gastroenterology   Lumbar back pain    Increase flexeril to 10 mg three times a day as needed  Stop gabapentin.  Start lyrica 75 mg twice daily.  Referred back to Dr. Arnoldo Morale.       Relevant Medications   meloxicam (MOBIC) 15 MG tablet   cyclobenzaprine (FLEXERIL) 10 MG tablet   pregabalin (LYRICA) 75 MG capsule   amoxicillin-clavulanate (AUGMENTIN) 875-125 MG tablet   Other Relevant Orders   Ambulatory referral to Neurosurgery  .  Meds ordered this encounter  Medications   omeprazole (PRILOSEC) 40 MG capsule    Sig: Take 1 capsule (40 mg total) by mouth daily.    Dispense:  90 capsule    Refill:  3   cyclobenzaprine (FLEXERIL) 10 MG tablet    Sig: Take 1 tablet (10 mg total) by mouth 3 (three) times daily as needed for muscle spasms.    Dispense:  270 tablet    Refill:  0   fluticasone-salmeterol (ADVAIR HFA) 115-21 MCG/ACT inhaler    Sig: Inhale 2 puffs into the lungs 2 (two) times daily.    Dispense:  3 each    Refill:  3   pregabalin (LYRICA) 75 MG capsule    Sig: Take 1 capsule (75 mg total) by mouth 2 (two) times daily.    Dispense:  60 capsule    Refill:  3   nicotine (NICODERM CQ) 21 mg/24hr patch    Sig: Place 1 patch (21 mg total) onto the skin daily.    Dispense:  28 patch    Refill:  0   amoxicillin-clavulanate (AUGMENTIN) 875-125 MG tablet    Sig: Take 1 tablet by mouth 2 (two) times daily.    Dispense:  20 tablet    Refill:  0   ALPRAZolam (XANAX) 0.25 MG tablet    Sig: TAKE 1 TABLET(0.25 MG) BY MOUTH DAILY AS NEEDED FOR ANXIETY    Dispense:  30 tablet    Refill:  2    Orders Placed This Encounter  Procedures   CBC with Differential/Platelet   Comprehensive  metabolic panel   Hemoglobin A1c   Lipid panel   TSH   Cardiovascular Risk Assessment   Ambulatory referral to Gastroenterology  Ambulatory referral to Neurosurgery     Follow-up: Return in about 3 months (around 12/09/2022) for chronic fasting.  An After Visit Summary was printed and given to the patient.  Rochel Brome, MD [name] [phone]

## 2022-09-09 NOTE — Patient Instructions (Addendum)
Calcium D goal: Calcium citrate 1200-1500 mg daily with vitamin D.   Discontinue gabapentin. Start on lyrica 75 mg twice daily   Start on omeprazole 40 mg daily   Changed flexeril to 10 mg three times a day as needed for muscle spasms. Referred back to Dr. Arnoldo Morale.   Sinusitis: augmentin prescription sent.

## 2022-09-10 LAB — COMPREHENSIVE METABOLIC PANEL
ALT: 20 IU/L (ref 0–32)
AST: 19 IU/L (ref 0–40)
Albumin/Globulin Ratio: 1.9 (ref 1.2–2.2)
Albumin: 4.6 g/dL (ref 3.8–4.9)
Alkaline Phosphatase: 94 IU/L (ref 44–121)
BUN/Creatinine Ratio: 19 (ref 9–23)
BUN: 11 mg/dL (ref 6–24)
Bilirubin Total: 0.2 mg/dL (ref 0.0–1.2)
CO2: 26 mmol/L (ref 20–29)
Calcium: 10.8 mg/dL — ABNORMAL HIGH (ref 8.7–10.2)
Chloride: 98 mmol/L (ref 96–106)
Creatinine, Ser: 0.57 mg/dL (ref 0.57–1.00)
Globulin, Total: 2.4 g/dL (ref 1.5–4.5)
Glucose: 91 mg/dL (ref 70–99)
Potassium: 4.3 mmol/L (ref 3.5–5.2)
Sodium: 138 mmol/L (ref 134–144)
Total Protein: 7 g/dL (ref 6.0–8.5)
eGFR: 106 mL/min/{1.73_m2} (ref >59)

## 2022-09-10 LAB — CBC WITH DIFFERENTIAL/PLATELET
Basophils Absolute: 0 10*3/uL (ref 0.0–0.2)
Basos: 0 %
EOS (ABSOLUTE): 0.2 10*3/uL (ref 0.0–0.4)
Eos: 2 %
Hematocrit: 47.2 % — ABNORMAL HIGH (ref 34.0–46.6)
Hemoglobin: 16.2 g/dL — ABNORMAL HIGH (ref 11.1–15.9)
Immature Grans (Abs): 0 10*3/uL (ref 0.0–0.1)
Immature Granulocytes: 0 %
Lymphocytes Absolute: 2.4 10*3/uL (ref 0.7–3.1)
Lymphs: 21 %
MCH: 31.8 pg (ref 26.6–33.0)
MCHC: 34.3 g/dL (ref 31.5–35.7)
MCV: 93 fL (ref 79–97)
Monocytes Absolute: 0.7 10*3/uL (ref 0.1–0.9)
Monocytes: 6 %
Neutrophils Absolute: 8.3 10*3/uL — ABNORMAL HIGH (ref 1.4–7.0)
Neutrophils: 71 %
Platelets: 350 10*3/uL (ref 150–450)
RBC: 5.09 x10E6/uL (ref 3.77–5.28)
RDW: 11.9 % (ref 11.7–15.4)
WBC: 11.7 10*3/uL — ABNORMAL HIGH (ref 3.4–10.8)

## 2022-09-10 LAB — TSH: TSH: 1.69 u[IU]/mL (ref 0.450–4.500)

## 2022-09-10 LAB — HEMOGLOBIN A1C
Est. average glucose Bld gHb Est-mCnc: 108 mg/dL
Hgb A1c MFr Bld: 5.4 % (ref 4.8–5.6)

## 2022-09-10 LAB — LIPID PANEL
Chol/HDL Ratio: 5 ratio — ABNORMAL HIGH (ref 0.0–4.4)
Cholesterol, Total: 291 mg/dL — ABNORMAL HIGH (ref 100–199)
HDL: 58 mg/dL (ref >39)
LDL Chol Calc (NIH): 201 mg/dL — ABNORMAL HIGH (ref 0–99)
Triglycerides: 173 mg/dL — ABNORMAL HIGH (ref 0–149)
VLDL Cholesterol Cal: 32 mg/dL (ref 5–40)

## 2022-09-10 LAB — CARDIOVASCULAR RISK ASSESSMENT

## 2022-09-14 DIAGNOSIS — M545 Low back pain, unspecified: Secondary | ICD-10-CM | POA: Insufficient documentation

## 2022-09-14 DIAGNOSIS — Z1211 Encounter for screening for malignant neoplasm of colon: Secondary | ICD-10-CM | POA: Insufficient documentation

## 2022-09-14 NOTE — Assessment & Plan Note (Signed)
Prescription: augmentin.

## 2022-09-14 NOTE — Assessment & Plan Note (Signed)
Continue Albuterol hfa 2 puffs four times a day as needed shortness of breath. On Advair 2 puffs twice one daily. On singulair.

## 2022-09-14 NOTE — Assessment & Plan Note (Signed)
Recommend repatha.  Intolerant to lipitor and zetia.  Recommend continue to work on eating healthy diet and exercise. Check labs.

## 2022-09-14 NOTE — Assessment & Plan Note (Signed)
Well controlled.  No changes to medicines. Continue On amlodipine 10 mg daily, furosemide 20 mg as needed (rarely takes). Continue to work on eating a healthy diet and exercise.  Labs drawn today.

## 2022-09-14 NOTE — Assessment & Plan Note (Addendum)
Increase flexeril to 10 mg three times a day as needed  Stop gabapentin.  Start lyrica 75 mg twice daily.  Referred back to Dr. Arnoldo Morale.

## 2022-09-14 NOTE — Assessment & Plan Note (Signed)
>>  ASSESSMENT AND PLAN FOR MODERATE PERSISTENT ASTHMA WITHOUT COMPLICATION/COPD WRITTEN ON 09/14/2022 10:04 PM BY COX, KIRSTEN, MD  Continue Albuterol hfa 2 puffs four times a day as needed shortness of breath. On Advair 2 puffs twice one daily. On singulair.

## 2022-09-14 NOTE — Assessment & Plan Note (Signed)
Continue advair 2 puffs twice daily.

## 2022-09-14 NOTE — Assessment & Plan Note (Signed)
Intolerant to statins. 

## 2022-09-14 NOTE — Assessment & Plan Note (Signed)
Start nicoderm patches.

## 2022-09-14 NOTE — Assessment & Plan Note (Signed)
Not at goal.  Intolerant to statins.  Recommend repatha.  Continue to work on eating a healthy diet and exercise.  Labs drawn today.

## 2022-09-14 NOTE — Assessment & Plan Note (Signed)
Currently on no medicines.

## 2022-09-14 NOTE — Assessment & Plan Note (Signed)
Recommend continue to work on eating healthy diet and exercise.  

## 2022-09-14 NOTE — Assessment & Plan Note (Signed)
Start on omeprazole 40 mg daily. Continue pepcid 40 mg daily.

## 2022-09-20 DIAGNOSIS — M069 Rheumatoid arthritis, unspecified: Secondary | ICD-10-CM

## 2022-09-20 HISTORY — DX: Rheumatoid arthritis, unspecified: M06.9

## 2022-09-21 DIAGNOSIS — M5136 Other intervertebral disc degeneration, lumbar region: Secondary | ICD-10-CM | POA: Diagnosis not present

## 2022-09-21 DIAGNOSIS — Z6829 Body mass index (BMI) 29.0-29.9, adult: Secondary | ICD-10-CM | POA: Diagnosis not present

## 2022-09-21 DIAGNOSIS — M5416 Radiculopathy, lumbar region: Secondary | ICD-10-CM | POA: Diagnosis not present

## 2022-09-22 ENCOUNTER — Telehealth: Payer: Self-pay

## 2022-09-22 ENCOUNTER — Encounter: Payer: Self-pay | Admitting: Family Medicine

## 2022-09-22 NOTE — Telephone Encounter (Signed)
MEDICATION PRIOR AUTHORIZATION WAS DONE THROUGH PATIENT'S INSURANCE FOR REPATHA. PATIENT WAS DENIED THROUGH HER INSURANCE.

## 2022-09-23 ENCOUNTER — Other Ambulatory Visit: Payer: Self-pay | Admitting: Family Medicine

## 2022-09-23 DIAGNOSIS — J0181 Other acute recurrent sinusitis: Secondary | ICD-10-CM

## 2022-09-23 DIAGNOSIS — M545 Low back pain, unspecified: Secondary | ICD-10-CM

## 2022-09-23 DIAGNOSIS — M7062 Trochanteric bursitis, left hip: Secondary | ICD-10-CM | POA: Diagnosis not present

## 2022-09-24 ENCOUNTER — Other Ambulatory Visit: Payer: Self-pay | Admitting: Family Medicine

## 2022-09-24 ENCOUNTER — Encounter: Payer: Self-pay | Admitting: Family Medicine

## 2022-09-24 DIAGNOSIS — M545 Low back pain, unspecified: Secondary | ICD-10-CM

## 2022-09-24 DIAGNOSIS — J0181 Other acute recurrent sinusitis: Secondary | ICD-10-CM

## 2022-09-29 ENCOUNTER — Telehealth: Payer: Self-pay

## 2022-09-29 ENCOUNTER — Encounter: Payer: Self-pay | Admitting: Family Medicine

## 2022-09-29 DIAGNOSIS — M5126 Other intervertebral disc displacement, lumbar region: Secondary | ICD-10-CM | POA: Diagnosis not present

## 2022-09-29 DIAGNOSIS — M5136 Other intervertebral disc degeneration, lumbar region: Secondary | ICD-10-CM | POA: Diagnosis not present

## 2022-09-29 DIAGNOSIS — M5416 Radiculopathy, lumbar region: Secondary | ICD-10-CM | POA: Diagnosis not present

## 2022-09-29 NOTE — Telephone Encounter (Signed)
Patient called and stated that [facility] stated that her Empatha needs a PA.  Thank you

## 2022-09-30 ENCOUNTER — Other Ambulatory Visit: Payer: Self-pay | Admitting: Family Medicine

## 2022-09-30 ENCOUNTER — Telehealth: Payer: Self-pay

## 2022-09-30 DIAGNOSIS — M545 Low back pain, unspecified: Secondary | ICD-10-CM

## 2022-09-30 NOTE — Telephone Encounter (Signed)
[name] called to let us know that she received notification that her insurance denied the [place].  Dr. Tobie Poet had reviewed the denial and advised that she follow-up with cardiology.  She is under the care of a cardiologist at [facility].  She will give their office a call.

## 2022-10-01 ENCOUNTER — Other Ambulatory Visit: Payer: Self-pay | Admitting: Nurse Practitioner

## 2022-10-01 DIAGNOSIS — J309 Allergic rhinitis, unspecified: Secondary | ICD-10-CM

## 2022-10-01 NOTE — Telephone Encounter (Signed)
Patient called and stated that she spoke with her insurance and if the Repatha is sent in for 280 mg injection monthy they will cover. Please advise

## 2022-10-03 ENCOUNTER — Other Ambulatory Visit: Payer: Self-pay | Admitting: Family Medicine

## 2022-10-03 DIAGNOSIS — I10 Essential (primary) hypertension: Secondary | ICD-10-CM

## 2022-10-05 DIAGNOSIS — M48061 Spinal stenosis, lumbar region without neurogenic claudication: Secondary | ICD-10-CM | POA: Diagnosis not present

## 2022-10-05 DIAGNOSIS — M5116 Intervertebral disc disorders with radiculopathy, lumbar region: Secondary | ICD-10-CM | POA: Diagnosis not present

## 2022-10-06 ENCOUNTER — Other Ambulatory Visit: Payer: Self-pay | Admitting: Family Medicine

## 2022-10-06 ENCOUNTER — Ambulatory Visit: Payer: Medicaid Other | Admitting: Nurse Practitioner

## 2022-10-06 MED ORDER — REPATHA 140 MG/ML ~~LOC~~ SOSY: 140.0000 mg | PREFILLED_SYRINGE | SUBCUTANEOUS | 2 refills | Status: DC

## 2022-10-09 ENCOUNTER — Other Ambulatory Visit: Payer: Self-pay | Admitting: Family Medicine

## 2022-10-11 ENCOUNTER — Other Ambulatory Visit: Payer: Self-pay

## 2022-10-11 ENCOUNTER — Encounter: Payer: Self-pay | Admitting: Family Medicine

## 2022-10-11 MED ORDER — ALBUTEROL SULFATE HFA 108 (90 BASE) MCG/ACT IN AERS: INHALATION_SPRAY | RESPIRATORY_TRACT | 1 refills | Status: DC

## 2022-10-13 DIAGNOSIS — M79622 Pain in left upper arm: Secondary | ICD-10-CM | POA: Diagnosis not present

## 2022-10-14 ENCOUNTER — Telehealth: Payer: Self-pay

## 2022-10-14 NOTE — Telephone Encounter (Signed)
Patient made aware of Ozempic patient assistance arrival here at the office and to pick it up when she gets a chance.

## 2022-10-15 ENCOUNTER — Ambulatory Visit: Payer: Medicaid Other | Admitting: Family Medicine

## 2022-10-15 ENCOUNTER — Telehealth: Payer: Self-pay

## 2022-10-15 NOTE — Telephone Encounter (Signed)
FYI. I talked with patient and Insurance approved [name]. She pick up medicine yesterday.

## 2022-10-15 NOTE — Telephone Encounter (Signed)
PT PICKED UP PT ASSISTANCE OZEMPIC 01.26.24 SIGN PAPER FORM LEFT IN PHARM BOX

## 2022-10-25 ENCOUNTER — Other Ambulatory Visit: Payer: Self-pay | Admitting: Family Medicine

## 2022-10-25 DIAGNOSIS — F17219 Nicotine dependence, cigarettes, with unspecified nicotine-induced disorders: Secondary | ICD-10-CM

## 2022-10-26 DIAGNOSIS — M5136 Other intervertebral disc degeneration, lumbar region: Secondary | ICD-10-CM | POA: Diagnosis not present

## 2022-10-26 DIAGNOSIS — M79605 Pain in left leg: Secondary | ICD-10-CM | POA: Diagnosis not present

## 2022-10-26 DIAGNOSIS — Z6829 Body mass index (BMI) 29.0-29.9, adult: Secondary | ICD-10-CM | POA: Diagnosis not present

## 2022-10-26 DIAGNOSIS — M5416 Radiculopathy, lumbar region: Secondary | ICD-10-CM | POA: Diagnosis not present

## 2022-11-03 DIAGNOSIS — M5416 Radiculopathy, lumbar region: Secondary | ICD-10-CM | POA: Diagnosis not present

## 2022-11-17 ENCOUNTER — Other Ambulatory Visit: Payer: Self-pay | Admitting: Family Medicine

## 2022-11-17 DIAGNOSIS — G629 Polyneuropathy, unspecified: Secondary | ICD-10-CM

## 2022-11-18 DIAGNOSIS — M47816 Spondylosis without myelopathy or radiculopathy, lumbar region: Secondary | ICD-10-CM | POA: Diagnosis not present

## 2022-11-22 ENCOUNTER — Ambulatory Visit: Payer: Medicaid Other | Admitting: Family Medicine

## 2022-11-22 ENCOUNTER — Encounter: Payer: Self-pay | Admitting: Family Medicine

## 2022-11-22 VITALS — BP 124/80 | HR 88 | Temp 96.7°F | Ht 66.0 in | Wt 166.0 lb

## 2022-11-22 DIAGNOSIS — J0181 Other acute recurrent sinusitis: Secondary | ICD-10-CM | POA: Diagnosis not present

## 2022-11-22 LAB — POCT RAPID STREP A (OFFICE): Rapid Strep A Screen: NEGATIVE

## 2022-11-22 MED ORDER — AMOXICILLIN-POT CLAVULANATE 875-125 MG PO TABS: 1.0000 | ORAL_TABLET | Freq: Two times a day (BID) | ORAL | 0 refills | Status: DC

## 2022-11-22 NOTE — Progress Notes (Signed)
Acute Office Visit  Subjective:    Patient ID: [name], female    DOB: July 17, 1966, 57 y.o.   MRN: [id number]  Chief Complaint  Patient presents with   Sore Throat    HPI: Patient is in today for sore throat with some white patches x yesterday, denies sore throat, fever or chills. States she has a sinus headache. Also has some facial pain and pressure.  Past Medical History:  Diagnosis Date   Allergic rhinitis    Arthritis    Blood transfusion without reported diagnosis    Cellulitis, face 06/08/2022   COPD (chronic obstructive pulmonary disease) ([place])    Dental abscess 06/09/2022   GERD (gastroesophageal reflux disease)    History of colon polyps    HTN (hypertension)    Hyperlipidemia    Kidney stones    Pancreatitis    Pneumonia    Primary hypertension 10/20/2021   Psychotic depression ([place])    Shingles February 26th, 2001    Past Surgical History:  Procedure Laterality Date   ABDOMINAL HYSTERECTOMY     CESAREAN SECTION     COLONOSCOPY  05/02/2017   Colonic polyp status post polypectomy. Small internal hemorrhoids   GALLBLADDER SURGERY     REPLACEMENT TOTAL KNEE Right 02/09/2019   TOTAL KNEE ARTHROPLASTY  02/09/2019   TUBAL LIGATION      Family History  Problem Relation Age of Onset   COPD Mother    Parkinson's disease Mother    COPD Father    Arthritis Father    Lung disease Father    Colon cancer Cousin        mother's cousin   Breast cancer Maternal Aunt        great aunt   Diabetes Other    Stroke Other    Hypertension Other    Hyperlipidemia Other    Asthma Other    Heart failure Other    Thyroid disease Other    Heart attack Other    COPD Other    Arrhythmia Other    Arthritis Other    Migraines Other     Social History   Socioeconomic History   Marital status: Widowed    Spouse name: Not on file   Number of children: 2   Years of education: Not on file   Highest education level: High school graduate  Occupational  History   Not on file  Tobacco Use   Smoking status: Every Day    Packs/day: 1.50    Years: 20.00    Total pack years: 30.00    Types: Cigarettes   Smokeless tobacco: Never  Vaping Use   Vaping Use: Never used  Substance and Sexual Activity   Alcohol use: Yes    Comment: occas   Drug use: No   Sexual activity: Not Currently  Other Topics Concern   Not on file  Social History Narrative   Not on file   Social Determinants of Health   Financial Resource Strain: Not on file  Food Insecurity: Not on file  Transportation Needs: Not on file  Physical Activity: Not on file  Stress: Not on file  Social Connections: Not on file  Intimate Partner Violence: Not on file    Outpatient Medications Prior to Visit  Medication Sig Dispense Refill   Evolocumab (REPATHA) 140 MG/ML SOSY Inject 140 mg into the skin every 14 (fourteen) days. 2 mL 2   acetaminophen (TYLENOL) 325 MG tablet Take 2 tablets (650 mg total)  by mouth every 6 (six) hours as needed for mild pain (or Fever >/= 101). 20 tablet 0   albuterol (VENTOLIN HFA) 108 (90 Base) MCG/ACT inhaler INHALE 2 PUFFS INTO LUNGS EVERY 6 TO 8 HOURS AS NEEDED FOR WHEEZING 18 g 1   ALPRAZolam (XANAX) 0.25 MG tablet TAKE 1 TABLET(0.25 MG) BY MOUTH DAILY AS NEEDED FOR ANXIETY 30 tablet 2   amLODipine (NORVASC) 10 MG tablet TAKE 1 TABLET BY MOUTH DAILY 90 tablet 1   chlorhexidine (PERIDEX) 0.12 % solution SMARTSIG:By Mouth     cyclobenzaprine (FLEXERIL) 10 MG tablet Take 1 tablet (10 mg total) by mouth 3 (three) times daily as needed for muscle spasms. 270 tablet 0   dicyclomine (BENTYL) 10 MG capsule TAKE 1 CAPSULE(10 MG) BY MOUTH FOUR TIMES DAILY BEFORE MEALS AND AT BEDTIME 120 capsule 2   Docusate Sodium (DSS) 100 MG CAPS Take by mouth.     famotidine (PEPCID) 40 MG tablet TAKE 1 TABLET(40 MG) BY MOUTH DAILY 90 tablet 1   fluticasone (FLONASE) 50 MCG/ACT nasal spray SHAKE LIQUID AND USE 2 SPRAYS IN EACH NOSTRIL DAILY 16 g 6    fluticasone-salmeterol (ADVAIR HFA) 115-21 MCG/ACT inhaler Inhale 2 puffs into the lungs 2 (two) times daily. 3 each 3   furosemide (LASIX) 20 MG tablet Take 1 tablet (20 mg total) by mouth daily as needed for edema. 1 tablet 0   meloxicam (MOBIC) 15 MG tablet Take 15 mg by mouth daily.     montelukast (SINGULAIR) 10 MG tablet TAKE 1 TABLET(10 MG) BY MOUTH AT BEDTIME 90 tablet 0   nicotine (NICODERM CQ - DOSED IN MG/24 HOURS) 21 mg/24hr patch APPLY 1 PATCH(21 MG) TOPICALLY TO THE SKIN DAILY 28 patch 0   nitroGLYCERIN (NITROSTAT) 0.4 MG SL tablet DISSOLVE 1 TABLET BY MOUTH UNDER THE TONGUE AS NEEDED FOR CHEST PAIN EVERY 5 MINUTES UP TO 3 TIMES 25 tablet 0   omeprazole (PRILOSEC) 40 MG capsule Take 1 capsule (40 mg total) by mouth daily. 90 capsule 3   OZEMPIC, 2 MG/DOSE, 8 MG/3ML SOPN INJECT 2 MG AS DIRECTED ONCE A WEEK 9 mL 0   pregabalin (LYRICA) 75 MG capsule Take 1 capsule (75 mg total) by mouth 2 (two) times daily. 60 capsule 3   triamcinolone (KENALOG) 0.025 % ointment Apply 1 application. topically 2 (two) times daily. Apply to corners of mouth twice daily 30 g 0   Vitamin E (VITAMIN E/D-ALPHA NATURAL) 268 MG (400 UNIT) CAPS Take 400 Units by mouth daily.     vitamin E 180 MG (400 UNITS) capsule Take by mouth.     vitamin k 100 MCG tablet Take 100 mcg by mouth daily.     amoxicillin-clavulanate (AUGMENTIN) 875-125 MG tablet Take 1 tablet by mouth 2 (two) times daily. 20 tablet 0   No facility-administered medications prior to visit.    Allergies  Allergen Reactions   Levofloxacin Other (See Comments) and Rash    Rash all over redness   Bactrim Ds [Sulfamethoxazole-Trimethoprim] Nausea Only   Levaquin [Levofloxacin In D5w]    Lipitor [Atorvastatin] Other (See Comments)    Myalgia    Tape Hives   Zetia [Ezetimibe]     Muscle pain   Codeine Nausea Only   Other Rash    bandaids leave a red area if left on too long    Review of Systems  Constitutional:  Negative for chills,  fatigue and fever.  HENT:  Positive for sinus pressure, sinus pain and  sore throat. Negative for congestion, ear pain and rhinorrhea.   Respiratory:  Negative for cough and shortness of breath.   Cardiovascular:  Negative for chest pain.  Gastrointestinal:  Negative for constipation and diarrhea.  Genitourinary:  Negative for dysuria and urgency.  Neurological:  Negative for headaches.       Objective:        11/22/2022    8:37 AM 09/09/2022    8:34 AM 06/24/2022    2:17 PM  Vitals with BMI  Height '5\' 6"'$  '5\' 6"'$  '5\' 6"'$   Weight 166 lbs 163 lbs 164 lbs  BMI 26.81 123456 [id number]  Systolic [id number]  Diastolic 80 82 75  Pulse 88 93 71    No data found.   Physical Exam Vitals reviewed.  Constitutional:      Appearance: Normal appearance. She is normal weight.  HENT:     Right Ear: Tympanic membrane, ear canal and external ear normal.     Left Ear: Tympanic membrane, ear canal and external ear normal.     Nose: Mucosal edema and congestion present.     Right Turbinates: Swollen.     Left Turbinates: Swollen.     Mouth/Throat:     Mouth: Mucous membranes are moist.     Pharynx: Posterior oropharyngeal erythema present. No oropharyngeal exudate.  Cardiovascular:     Rate and Rhythm: Normal rate and regular rhythm.     Heart sounds: Normal heart sounds. No murmur heard. Pulmonary:     Effort: Pulmonary effort is normal. No respiratory distress.     Breath sounds: Normal breath sounds.  Lymphadenopathy:     Cervical: Cervical adenopathy present.  Neurological:     Mental Status: She is alert and oriented to person, place, and time.  Psychiatric:        Mood and Affect: Mood normal.        Behavior: Behavior normal.     There are no preventive care reminders to display for this patient.  There are no preventive care reminders to display for this patient.   Lab Results  Component Value Date   TSH 1.690 09/09/2022   Lab Results  Component Value Date   WBC 11.7 (H)  09/09/2022   HGB 16.2 (H) 09/09/2022   HCT 47.2 (H) 09/09/2022   MCV 93 09/09/2022   PLT 350 09/09/2022   Lab Results  Component Value Date   NA 138 09/09/2022   K 4.3 09/09/2022   CO2 26 09/09/2022   GLUCOSE 91 09/09/2022   BUN 11 09/09/2022   CREATININE 0.57 09/09/2022   BILITOT 0.2 09/09/2022   ALKPHOS 94 09/09/2022   AST 19 09/09/2022   ALT 20 09/09/2022   PROT 7.0 09/09/2022   ALBUMIN 4.6 09/09/2022   CALCIUM 10.8 (H) 09/09/2022   ANIONGAP 8 06/13/2022   EGFR 106 09/09/2022   Lab Results  Component Value Date   CHOL 291 (H) 09/09/2022   Lab Results  Component Value Date   HDL 58 09/09/2022   Lab Results  Component Value Date   LDLCALC 201 (H) 09/09/2022   Lab Results  Component Value Date   TRIG 173 (H) 09/09/2022   Lab Results  Component Value Date   CHOLHDL 5.0 (H) 09/09/2022   Lab Results  Component Value Date   HGBA1C 5.4 09/09/2022       Assessment & Plan:  Other acute recurrent sinusitis Assessment & Plan: - symptoms and exam c/w sinusitis   - no evidence of  AOM, CAP, strep pharyngitis, or other infection - given duration of symptoms, suspect bacterial etiology - will treat with augmentin x 10 days  Orders: -     Amoxicillin-Pot Clavulanate; Take 1 tablet by mouth 2 (two) times daily.  Dispense: 20 tablet; Refill: 0 -     POCT rapid strep A     Meds ordered this encounter  Medications   amoxicillin-clavulanate (AUGMENTIN) 875-125 MG tablet    Sig: Take 1 tablet by mouth 2 (two) times daily.    Dispense:  20 tablet    Refill:  0    Orders Placed This Encounter  Procedures   POCT rapid strep A     Follow-up: No follow-ups on file.  An After Visit Summary was printed and given to the patient.  Rochel Brome, MD [facility] [phone]

## 2022-11-22 NOTE — Assessment & Plan Note (Addendum)
-   symptoms and exam c/w sinusitis   - no evidence of AOM, CAP, strep pharyngitis, or other infection - given duration of symptoms, suspect bacterial etiology - will treat with augmentin x 10 days

## 2022-11-22 NOTE — Patient Instructions (Signed)
-   symptoms and exam c/w sinusitis   - no evidence of AOM, CAP, strep pharyngitis, or other infection - given duration of symptoms, suspect bacterial etiology - will treat with augmentin x 10 days - discussed symptomatic management (flonase, decongestants, etc), natural course, and return precautions

## 2022-11-29 ENCOUNTER — Other Ambulatory Visit: Payer: Self-pay | Admitting: Family Medicine

## 2022-11-29 MED ORDER — BUPROPION HCL ER (XL) 300 MG PO TB24: 300.0000 mg | ORAL_TABLET | Freq: Every day | ORAL | 1 refills | Status: DC

## 2022-11-30 DIAGNOSIS — M47816 Spondylosis without myelopathy or radiculopathy, lumbar region: Secondary | ICD-10-CM | POA: Diagnosis not present

## 2022-12-02 ENCOUNTER — Other Ambulatory Visit: Payer: Self-pay | Admitting: Family Medicine

## 2022-12-02 DIAGNOSIS — F17219 Nicotine dependence, cigarettes, with unspecified nicotine-induced disorders: Secondary | ICD-10-CM

## 2022-12-06 ENCOUNTER — Other Ambulatory Visit: Payer: Self-pay

## 2022-12-06 ENCOUNTER — Encounter: Payer: Self-pay | Admitting: Family Medicine

## 2022-12-06 DIAGNOSIS — K130 Diseases of lips: Secondary | ICD-10-CM

## 2022-12-06 MED ORDER — TRIAMCINOLONE ACETONIDE 0.025 % EX OINT: 1.0000 | TOPICAL_OINTMENT | Freq: Two times a day (BID) | CUTANEOUS | 0 refills | Status: AC

## 2022-12-08 NOTE — Telephone Encounter (Signed)
Scheduled tomorrow am. Patient notified. Dr. Tobie Poet

## 2022-12-09 ENCOUNTER — Encounter: Payer: Self-pay | Admitting: Family Medicine

## 2022-12-09 ENCOUNTER — Ambulatory Visit: Payer: Medicaid Other | Admitting: Family Medicine

## 2022-12-09 ENCOUNTER — Other Ambulatory Visit: Payer: Self-pay | Admitting: Family Medicine

## 2022-12-09 VITALS — BP 118/68 | HR 90 | Temp 96.2°F | Ht 66.0 in | Wt 170.0 lb

## 2022-12-09 DIAGNOSIS — J0100 Acute maxillary sinusitis, unspecified: Secondary | ICD-10-CM | POA: Diagnosis not present

## 2022-12-09 DIAGNOSIS — J0101 Acute recurrent maxillary sinusitis: Secondary | ICD-10-CM | POA: Insufficient documentation

## 2022-12-09 MED ORDER — AMOXICILLIN-POT CLAVULANATE 875-125 MG PO TABS: 1.0000 | ORAL_TABLET | Freq: Two times a day (BID) | ORAL | 0 refills | Status: DC

## 2022-12-09 MED ORDER — CEFTRIAXONE SODIUM 1 G IJ SOLR
1.0000 g | Freq: Once | INTRAMUSCULAR | Status: AC
  Administered 2022-12-09: 1 g via INTRAMUSCULAR

## 2022-12-09 MED ORDER — TRIAMCINOLONE ACETONIDE 40 MG/ML IJ SUSP
80.0000 mg | Freq: Once | INTRAMUSCULAR | Status: AC
  Administered 2022-12-09: 80 mg via INTRAMUSCULAR

## 2022-12-09 NOTE — Patient Instructions (Signed)
Augmentin prescription sent 

## 2022-12-09 NOTE — Assessment & Plan Note (Signed)
-   symptoms and exam c/w sinusitis   - no evidence of AOM, CAP, strep pharyngitis, or other infection - given duration of symptoms, suspect bacterial etiology - will treat with augmentin x 10 days - discussed symptomatic management (flonase, decongestants, etc), natural course, and return precautions   

## 2022-12-09 NOTE — Progress Notes (Signed)
Acute Office Visit  Subjective:    Patient ID: [name], female    DOB: 12-26-1965, 57 y.o.   MRN: [id number]  Chief Complaint  Patient presents with   Allergies   Sinusitis    HPI: Patient is in today for uri. Complains of facial pain and pressure, congestion, ears do not hurt, sore throat relieved by alka seltzer, fatigue x 2 weeks ago. Has been treated with augmentin.  It helped, but did not fully resolve.  Past Medical History:  Diagnosis Date   Allergic rhinitis    Arthritis    Blood transfusion without reported diagnosis    Cellulitis, face 06/08/2022   COPD (chronic obstructive pulmonary disease) ([place])    Dental abscess 06/09/2022   GERD (gastroesophageal reflux disease)    History of colon polyps    HTN (hypertension)    Hyperlipidemia    Kidney stones    Pancreatitis    Pneumonia    Primary hypertension 10/20/2021   Psychotic depression ([place])    Shingles February 26th, 2001    Past Surgical History:  Procedure Laterality Date   ABDOMINAL HYSTERECTOMY     CESAREAN SECTION     COLONOSCOPY  05/02/2017   Colonic polyp status post polypectomy. Small internal hemorrhoids   GALLBLADDER SURGERY     REPLACEMENT TOTAL KNEE Right 02/09/2019   TOTAL KNEE ARTHROPLASTY  02/09/2019   TUBAL LIGATION      Family History  Problem Relation Age of Onset   COPD Mother    Parkinson's disease Mother    COPD Father    Arthritis Father    Lung disease Father    Colon cancer Cousin        mother's cousin   Breast cancer Maternal Aunt        great aunt   Diabetes Other    Stroke Other    Hypertension Other    Hyperlipidemia Other    Asthma Other    Heart failure Other    Thyroid disease Other    Heart attack Other    COPD Other    Arrhythmia Other    Arthritis Other    Migraines Other     Social History   Socioeconomic History   Marital status: Widowed    Spouse name: Not on file   Number of children: 2   Years of education: Not on file   Highest  education level: High school graduate  Occupational History   Not on file  Tobacco Use   Smoking status: Every Day    Packs/day: 1.50    Years: 20.00    Additional pack years: 0.00    Total pack years: 30.00    Types: Cigarettes   Smokeless tobacco: Never  Vaping Use   Vaping Use: Never used  Substance and Sexual Activity   Alcohol use: Yes    Comment: occas   Drug use: No   Sexual activity: Not Currently  Other Topics Concern   Not on file  Social History Narrative   Not on file   Social Determinants of Health   Financial Resource Strain: Not on file  Food Insecurity: Not on file  Transportation Needs: Not on file  Physical Activity: Not on file  Stress: Not on file  Social Connections: Not on file  Intimate Partner Violence: Not on file    Outpatient Medications Prior to Visit  Medication Sig Dispense Refill   Evolocumab (REPATHA) 140 MG/ML SOSY Inject 140 mg into the skin every 14 (  fourteen) days. 2 mL 2   acetaminophen (TYLENOL) 325 MG tablet Take 2 tablets (650 mg total) by mouth every 6 (six) hours as needed for mild pain (or Fever >/= 101). 20 tablet 0   albuterol (VENTOLIN HFA) 108 (90 Base) MCG/ACT inhaler INHALE 2 PUFFS INTO LUNGS EVERY 6 TO 8 HOURS AS NEEDED FOR WHEEZING 18 g 1   ALPRAZolam (XANAX) 0.25 MG tablet TAKE 1 TABLET(0.25 MG) BY MOUTH DAILY AS NEEDED FOR ANXIETY 30 tablet 2   amLODipine (NORVASC) 10 MG tablet TAKE 1 TABLET BY MOUTH DAILY 90 tablet 1   buPROPion (WELLBUTRIN XL) 300 MG 24 hr tablet Take 1 tablet (300 mg total) by mouth daily. 90 tablet 1   chlorhexidine (PERIDEX) 0.12 % solution SMARTSIG:By Mouth     cyclobenzaprine (FLEXERIL) 10 MG tablet Take 1 tablet (10 mg total) by mouth 3 (three) times daily as needed for muscle spasms. 270 tablet 0   dicyclomine (BENTYL) 10 MG capsule TAKE 1 CAPSULE(10 MG) BY MOUTH FOUR TIMES DAILY BEFORE MEALS AND AT BEDTIME 120 capsule 2   Docusate Sodium (DSS) 100 MG CAPS Take by mouth.     famotidine  (PEPCID) 40 MG tablet TAKE 1 TABLET(40 MG) BY MOUTH DAILY 90 tablet 1   fluticasone (FLONASE) 50 MCG/ACT nasal spray SHAKE LIQUID AND USE 2 SPRAYS IN EACH NOSTRIL DAILY 16 g 6   fluticasone-salmeterol (ADVAIR HFA) 115-21 MCG/ACT inhaler Inhale 2 puffs into the lungs 2 (two) times daily. 3 each 3   furosemide (LASIX) 20 MG tablet Take 1 tablet (20 mg total) by mouth daily as needed for edema. 1 tablet 0   meloxicam (MOBIC) 15 MG tablet Take 15 mg by mouth daily.     montelukast (SINGULAIR) 10 MG tablet TAKE 1 TABLET(10 MG) BY MOUTH AT BEDTIME 90 tablet 0   nicotine (NICODERM CQ - DOSED IN MG/24 HOURS) 21 mg/24hr patch APPLY 1 PATCH(21 MG) TOPICALLY TO THE SKIN DAILY 28 patch 0   nitroGLYCERIN (NITROSTAT) 0.4 MG SL tablet DISSOLVE 1 TABLET BY MOUTH UNDER THE TONGUE AS NEEDED FOR CHEST PAIN EVERY 5 MINUTES UP TO 3 TIMES 25 tablet 0   omeprazole (PRILOSEC) 40 MG capsule Take 1 capsule (40 mg total) by mouth daily. 90 capsule 3   OZEMPIC, 2 MG/DOSE, 8 MG/3ML SOPN INJECT 2 MG AS DIRECTED ONCE A WEEK 9 mL 0   pregabalin (LYRICA) 75 MG capsule Take 1 capsule (75 mg total) by mouth 2 (two) times daily. 60 capsule 3   triamcinolone (KENALOG) 0.025 % ointment Apply 1 Application topically 2 (two) times daily. Apply to corners of mouth twice daily 30 g 0   Vitamin E (VITAMIN E/D-ALPHA NATURAL) 268 MG (400 UNIT) CAPS Take 400 Units by mouth daily.     vitamin E 180 MG (400 UNITS) capsule Take by mouth.     vitamin k 100 MCG tablet Take 100 mcg by mouth daily.     amoxicillin-clavulanate (AUGMENTIN) 875-125 MG tablet Take 1 tablet by mouth 2 (two) times daily. 20 tablet 0   No facility-administered medications prior to visit.    Allergies  Allergen Reactions   Levofloxacin Other (See Comments) and Rash    Rash all over redness   Bactrim Ds [Sulfamethoxazole-Trimethoprim] Nausea Only   Levaquin [Levofloxacin In D5w]    Lipitor [Atorvastatin] Other (See Comments)    Myalgia    Tape Hives   Zetia  [Ezetimibe]     Muscle pain   Codeine Nausea Only  Other Rash    bandaids leave a red area if left on too long    Review of Systems  Constitutional:  Negative for chills, fatigue and fever.  HENT:  Positive for congestion, sinus pressure, sinus pain and sore throat. Negative for ear pain, postnasal drip and rhinorrhea.   Respiratory:  Negative for cough and shortness of breath.   Cardiovascular:  Negative for chest pain.  Gastrointestinal:  Negative for diarrhea and nausea.  Neurological:  Negative for dizziness and headaches.       Objective:        12/09/2022    7:41 AM 11/22/2022    8:37 AM 09/09/2022    8:34 AM  Vitals with BMI  Height 5\' 6"  5\' 6"  5\' 6"   Weight 170 lbs 166 lbs 163 lbs  BMI 27.45 [id number]  Systolic 123456 [id number]  Diastolic 68 80 82  Pulse 90 88 93    No data found.   Physical Exam Vitals reviewed.  Constitutional:      Appearance: Normal appearance.  HENT:     Right Ear: Tympanic membrane, ear canal and external ear normal.     Left Ear: Tympanic membrane, ear canal and external ear normal.     Nose: Congestion present.     Comments: BL sinus tenderness     Mouth/Throat:     Pharynx: Oropharynx is clear.  Cardiovascular:     Rate and Rhythm: Normal rate and regular rhythm.     Heart sounds: Normal heart sounds. No murmur heard. Pulmonary:     Effort: Pulmonary effort is normal. No respiratory distress.     Breath sounds: Normal breath sounds.  Lymphadenopathy:     Cervical: No cervical adenopathy.  Neurological:     Mental Status: She is alert and oriented to person, place, and time.  Psychiatric:        Mood and Affect: Mood normal.        Behavior: Behavior normal.     Health Maintenance Due  Topic Date Due   Lung Cancer Screening  12/31/2022    There are no preventive care reminders to display for this patient.   Lab Results  Component Value Date   TSH 1.690 09/09/2022   Lab Results  Component Value Date   WBC 11.7  (H) 09/09/2022   HGB 16.2 (H) 09/09/2022   HCT 47.2 (H) 09/09/2022   MCV 93 09/09/2022   PLT 350 09/09/2022   Lab Results  Component Value Date   NA 138 09/09/2022   K 4.3 09/09/2022   CO2 26 09/09/2022   GLUCOSE 91 09/09/2022   BUN 11 09/09/2022   CREATININE 0.57 09/09/2022   BILITOT 0.2 09/09/2022   ALKPHOS 94 09/09/2022   AST 19 09/09/2022   ALT 20 09/09/2022   PROT 7.0 09/09/2022   ALBUMIN 4.6 09/09/2022   CALCIUM 10.8 (H) 09/09/2022   ANIONGAP 8 06/13/2022   EGFR 106 09/09/2022   Lab Results  Component Value Date   CHOL 291 (H) 09/09/2022   Lab Results  Component Value Date   HDL 58 09/09/2022   Lab Results  Component Value Date   LDLCALC 201 (H) 09/09/2022   Lab Results  Component Value Date   TRIG 173 (H) 09/09/2022   Lab Results  Component Value Date   CHOLHDL 5.0 (H) 09/09/2022   Lab Results  Component Value Date   HGBA1C 5.4 09/09/2022       Assessment & Plan:  Acute non-recurrent maxillary sinusitis  Assessment & Plan: - symptoms and exam c/w sinusitis   - no evidence of AOM, CAP, strep pharyngitis, or other infection - given duration of symptoms, suspect bacterial etiology - will treat with augmentin x 10 days - discussed symptomatic management (flonase, decongestants, etc), natural course, and return precautions    Orders: -     Amoxicillin-Pot Clavulanate; Take 1 tablet by mouth 2 (two) times daily.  Dispense: 20 tablet; Refill: 0 -     Triamcinolone Acetonide -     cefTRIAXone Sodium     Meds ordered this encounter  Medications   amoxicillin-clavulanate (AUGMENTIN) 875-125 MG tablet    Sig: Take 1 tablet by mouth 2 (two) times daily.    Dispense:  20 tablet    Refill:  0   triamcinolone acetonide (KENALOG-40) injection 80 mg   cefTRIAXone (ROCEPHIN) injection 1 g    No orders of the defined types were placed in this encounter.    Follow-up: Return if symptoms worsen or fail to improve.  An After Visit Summary was printed  and given to the patient.  Rochel Brome, MD [facility] [phone]

## 2022-12-14 ENCOUNTER — Other Ambulatory Visit: Payer: Self-pay | Admitting: Podiatry

## 2022-12-14 ENCOUNTER — Ambulatory Visit (INDEPENDENT_AMBULATORY_CARE_PROVIDER_SITE_OTHER): Payer: Medicaid Other

## 2022-12-14 ENCOUNTER — Ambulatory Visit: Payer: Medicaid Other | Admitting: Podiatry

## 2022-12-14 DIAGNOSIS — M79671 Pain in right foot: Secondary | ICD-10-CM

## 2022-12-14 DIAGNOSIS — M25872 Other specified joint disorders, left ankle and foot: Secondary | ICD-10-CM | POA: Diagnosis not present

## 2022-12-14 DIAGNOSIS — M205X1 Other deformities of toe(s) (acquired), right foot: Secondary | ICD-10-CM

## 2022-12-14 DIAGNOSIS — M79672 Pain in left foot: Secondary | ICD-10-CM

## 2022-12-14 NOTE — Progress Notes (Signed)
Subjective:  Patient ID: [name], female    DOB: September 19, 1966,  MRN: [id number]  Chief Complaint  Patient presents with   Toe Pain    Right great toe pain  Left foot toes are curling     57 y.o. female presents with concern for pain in the right great toe joint as well as the second and third toe on the left foot which seem to be curling up and over the great toe.  She says that she has had issues with the left great toe is for a long time and now cause her a lot of pain but they have been gradually progressing in terms of deformity.  She had her bunion on her left foot corrected by Dr. Blenda Mounts many years ago.  Also has pain with the right great toe joint especially when she bends back to the hallux when driving barefoot.  Past Medical History:  Diagnosis Date   Allergic rhinitis    Arthritis    Blood transfusion without reported diagnosis    Cellulitis, face 06/08/2022   COPD (chronic obstructive pulmonary disease) ([place])    Dental abscess 06/09/2022   GERD (gastroesophageal reflux disease)    History of colon polyps    HTN (hypertension)    Hyperlipidemia    Kidney stones    Pancreatitis    Pneumonia    Primary hypertension 10/20/2021   Psychotic depression ([place])    Shingles February 26th, 2001    Allergies  Allergen Reactions   Levofloxacin Other (See Comments) and Rash    Rash all over redness   Bactrim Ds [Sulfamethoxazole-Trimethoprim] Nausea Only   Levaquin [Levofloxacin In D5w]    Lipitor [Atorvastatin] Other (See Comments)    Myalgia    Tape Hives   Zetia [Ezetimibe]     Muscle pain   Codeine Nausea Only   Other Rash    bandaids leave a red area if left on too long    ROS: Negative except as per HPI above  Objective:  General: AAO x3, NAD  Dermatological: With inspection and palpation of the right and left lower extremities there are no open sores, no preulcerative lesions, no rash or signs of infection present. Nails are of normal length thickness  and coloration.   Vascular:  Dorsalis Pedis artery and Posterior Tibial artery pedal pulses are 2/4 bilateral.  Capillary fill time < 3 sec to all digits.   Neruologic: Grossly intact via light touch bilateral. Protective threshold intact to all sites bilateral.   Musculoskeletal: Decreased range of motion and pain of the right first MPJ which is made worse with dorsiflexion range of motion.  Also with crossover toe deformity of the left second toe and hammertoe deformity of digits 2 through 4 on the left foot with the worst being second and third toe.  Flexible in nature.  Gait: Unassisted, Nonantalgic.   No images are attached to the encounter.  Radiographs:  Date: 12/14/2022 XR bilateral foot weightbearing AP/Lateral/Oblique   Findings: Attention directed to the right foot there is no be decreased joint space and narrowing at the hallux first metatarsophalangeal joint.  Unequal joint space narrowing is noted.  On the left foot there is no to be transverse and sagittal plane deformity the second and third toes with crossover deformity of the second toe. Assessment:   1. Hallux limitus of right foot   2. 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 Pain in both feet  Plan:  Patient was evaluated and treated and all questions answered.  # Hallux limitus of right foot We discussed the etiology and treatment options for hallux limitus / rigidus including surgical and non surgical treatment options. We discussed shoe gear changes, orthotics, injection therapy. We also discussed cheilectomy, 1st MTP arthrodesis, and arthroplasty with implants and the rationale for all of these. We discussed that each has advantages/disadvantages, and risks/benefits.  -Recommend injection at this time -Proceeded with injection of 1 cc half percent Marcaine plain with 1 cc Kenalog 10 into the lateral aspect of the first metatarsophalangeal joint after sterile alcohol prep and  applied sterile adhesive bandage afterwards.  Patient tolerated well -Discussed that if the injection does not fully resolve her pain there would be options including cheilectomy that could be considered versus decompressive osteotomy of the first metatarsal however this would only be done if she continues to have significant pain.  # Predislocation syndrome of the second and third MPJ on the left foot, hammertoe second through fourth toe left foot -Patient states the toes do not bother her significantly at this time so therefore we will monitor for now. -Did recommend a Budin splint double toe splint for the second third toe which she will try and see if this provides any relief however I discussed this would likely not straighten the toes that she is lost her plantar plate and the second metatarsophalangeal joint.   Return if symptoms worsen or fail to improve.          Everitt Amber, DPM [facility]

## 2022-12-15 ENCOUNTER — Other Ambulatory Visit: Payer: Self-pay | Admitting: Podiatry

## 2022-12-15 DIAGNOSIS — M205X1 Other deformities of toe(s) (acquired), right foot: Secondary | ICD-10-CM

## 2022-12-15 DIAGNOSIS — M25872 Other specified joint disorders, left ankle and foot: Secondary | ICD-10-CM

## 2022-12-15 DIAGNOSIS — M79672 Pain in left foot: Secondary | ICD-10-CM

## 2022-12-15 NOTE — Assessment & Plan Note (Signed)
Hemoglobin A1c 5.4%, 3 month avg of blood sugars, is in prediabetic range.  In order to prevent progression to diabetes, recommend low carb diet and regular exercise  

## 2022-12-15 NOTE — Assessment & Plan Note (Signed)
Well controlled.  No changes to medicines. Continue On amlodipine 10 mg daily, furosemide 20 mg as needed (rarely takes). Continue to work on eating a healthy diet and exercise.  Labs drawn today.   

## 2022-12-15 NOTE — Assessment & Plan Note (Signed)
The current medical regimen is effective;  continue present plan and medications. Taking Alprazolam 0.25 mg daily PRN

## 2022-12-15 NOTE — Assessment & Plan Note (Signed)
Continue on omeprazole 40 mg daily. Continue pepcid 40 mg daily.

## 2022-12-15 NOTE — Assessment & Plan Note (Signed)
Well controlled.  No changes to medicines. Repatha Continue to work on eating a healthy diet and exercise.  Labs drawn today.

## 2022-12-16 NOTE — Progress Notes (Unsigned)
Subjective:  Patient ID: [name], female    DOB: 1966-03-19  Age: 57 y.o. MRN: [id number]  Chief Complaint  Patient presents with   Prediabetes   Hyperlipidemia    HPI Hyperlipidemia/Aortic atherosclerosis:  Intolerant to lipitor and zetia. Taking Repatha 140 mg every 14 days.  Hypertension: On amlodipine 10 mg daily, furosemide 20 mg as needed (rarely takes).  GERD: Taking omeprazole 40 mg daily, Pepcid 40 mg daily. Has heartburn all the time.   Prediabetes; a1c 5.4. Eating healthy.  On ozempic 2 mg weekly.   Chronic back pain: on gabapentin 300 mg before bed as needed. Cyclobenzaprine 10 mg three times a day.  Asthma: Albuterol hfa 2 puffs four times a day as needed shortness of breath. On Advair 2 puffs twice one daily. On singulair.   Tobacco disorder:  Nicoderm patches. Smoking 1 ppd.      06/24/2022    2:28 PM  Depression screen PHQ 2/9  Decreased Interest 0  Down, Depressed, Hopeless 0  PHQ - 2 Score 0         06/08/2022    6:37 PM 06/08/2022    7:06 PM 06/13/2022    9:58 AM 06/24/2022    2:28 PM 11/22/2022    8:42 AM  Fall Risk  Falls in the past year?    0 0  Was there an injury with Fall?    0 0  Fall Risk Category Calculator    0 0  Fall Risk Category (Retired)    Low   (RETIRED) Patient Fall Risk Level Low fall risk Low fall risk Low fall risk    Patient at Risk for Falls Due to     No Fall Risks  Fall risk Follow up     Falls evaluation completed      Review of Systems  Constitutional:  Negative for chills, fatigue and fever.  HENT:  Negative for congestion, ear pain, rhinorrhea and sore throat.   Respiratory:  Negative for cough and shortness of breath.   Cardiovascular:  Negative for chest pain.  Gastrointestinal:  Negative for abdominal pain, constipation, diarrhea, nausea and vomiting.  Genitourinary:  Negative for dysuria and urgency.  Musculoskeletal:  Positive for back pain. Negative for myalgias.  Neurological:  Negative for  dizziness, weakness, light-headedness and headaches.  Psychiatric/Behavioral:  Negative for dysphoric mood. The patient is not nervous/anxious.     Current Outpatient Medications on File Prior to Visit  Medication Sig Dispense Refill   Evolocumab (REPATHA) 140 MG/ML SOSY Inject 140 mg into the skin every 14 (fourteen) days. 2 mL 2   acetaminophen (TYLENOL) 325 MG tablet Take 2 tablets (650 mg total) by mouth every 6 (six) hours as needed for mild pain (or Fever >/= 101). 20 tablet 0   albuterol (VENTOLIN HFA) 108 (90 Base) MCG/ACT inhaler INHALE 2 PUFFS INTO LUNGS EVERY 6 TO 8 HOURS AS NEEDED FOR WHEEZING 18 g 1   amLODipine (NORVASC) 10 MG tablet TAKE 1 TABLET BY MOUTH DAILY 90 tablet 1   amoxicillin-clavulanate (AUGMENTIN) 875-125 MG tablet Take 1 tablet by mouth 2 (two) times daily. 20 tablet 0   buPROPion (WELLBUTRIN XL) 300 MG 24 hr tablet Take 1 tablet (300 mg total) by mouth daily. 90 tablet 1   chlorhexidine (PERIDEX) 0.12 % solution SMARTSIG:By Mouth     dicyclomine (BENTYL) 10 MG capsule TAKE 1 CAPSULE(10 MG) BY MOUTH FOUR TIMES DAILY BEFORE MEALS AND AT BEDTIME 120 capsule 2   Docusate Sodium (  DSS) 100 MG CAPS Take by mouth.     famotidine (PEPCID) 40 MG tablet TAKE 1 TABLET(40 MG) BY MOUTH DAILY 90 tablet 1   fluticasone (FLONASE) 50 MCG/ACT nasal spray SHAKE LIQUID AND USE 2 SPRAYS IN EACH NOSTRIL DAILY 16 g 6   fluticasone-salmeterol (ADVAIR HFA) 115-21 MCG/ACT inhaler Inhale 2 puffs into the lungs 2 (two) times daily. 3 each 3   furosemide (LASIX) 20 MG tablet Take 1 tablet (20 mg total) by mouth daily as needed for edema. 1 tablet 0   montelukast (SINGULAIR) 10 MG tablet TAKE 1 TABLET(10 MG) BY MOUTH AT BEDTIME 90 tablet 0   nicotine (NICODERM CQ - DOSED IN MG/24 HOURS) 21 mg/24hr patch APPLY 1 PATCH(21 MG) TOPICALLY TO THE SKIN DAILY 28 patch 0   nitroGLYCERIN (NITROSTAT) 0.4 MG SL tablet DISSOLVE 1 TABLET BY MOUTH UNDER THE TONGUE AS NEEDED FOR CHEST PAIN EVERY 5 MINUTES UP TO  3 TIMES 25 tablet 0   omeprazole (PRILOSEC) 40 MG capsule Take 1 capsule (40 mg total) by mouth daily. 90 capsule 3   OZEMPIC, 2 MG/DOSE, 8 MG/3ML SOPN INJECT 2 MG AS DIRECTED ONCE A WEEK 9 mL 0   pregabalin (LYRICA) 75 MG capsule Take 1 capsule (75 mg total) by mouth 2 (two) times daily. 60 capsule 3   triamcinolone (KENALOG) 0.025 % ointment Apply 1 Application topically 2 (two) times daily. Apply to corners of mouth twice daily 30 g 0   Vitamin E (VITAMIN E/D-ALPHA NATURAL) 268 MG (400 UNIT) CAPS Take 400 Units by mouth daily.     vitamin E 180 MG (400 UNITS) capsule Take by mouth.     vitamin k 100 MCG tablet Take 100 mcg by mouth daily.     No current facility-administered medications on file prior to visit.   Past Medical History:  Diagnosis Date   Allergic rhinitis    Arthritis    Blood transfusion without reported diagnosis    Cellulitis, face 06/08/2022   COPD (chronic obstructive pulmonary disease) ([place])    Dental abscess 06/09/2022   GERD (gastroesophageal reflux disease)    History of colon polyps    HTN (hypertension)    Hyperlipidemia    Kidney stones    Pancreatitis    Pneumonia    Primary hypertension 10/20/2021   Psychotic depression ([place])    Shingles February 26th, 2001   Past Surgical History:  Procedure Laterality Date   ABDOMINAL HYSTERECTOMY     CESAREAN SECTION     COLONOSCOPY  05/02/2017   Colonic polyp status post polypectomy. Small internal hemorrhoids   GALLBLADDER SURGERY     REPLACEMENT TOTAL KNEE Right 02/09/2019   TOTAL KNEE ARTHROPLASTY  02/09/2019   TUBAL LIGATION      Family History  Problem Relation Age of Onset   COPD Mother    Parkinson's disease Mother    COPD Father    Arthritis Father    Lung disease Father    Colon cancer Cousin        mother's cousin   Breast cancer Maternal Aunt        great aunt   Diabetes Other    Stroke Other    Hypertension Other    Hyperlipidemia Other    Asthma Other    Heart failure Other     Thyroid disease Other    Heart attack Other    COPD Other    Arrhythmia Other    Arthritis Other    Migraines Other  Social History   Socioeconomic History   Marital status: Widowed    Spouse name: Not on file   Number of children: 2   Years of education: Not on file   Highest education level: GED or equivalent  Occupational History   Not on file  Tobacco Use   Smoking status: Every Day    Packs/day: 1.00    Years: 20.00    Additional pack years: 0.00    Total pack years: 20.00    Types: Cigarettes   Smokeless tobacco: Never  Vaping Use   Vaping Use: Never used  Substance and Sexual Activity   Alcohol use: Yes    Comment: occas   Drug use: No   Sexual activity: Not Currently  Other Topics Concern   Not on file  Social History Narrative   Not on file   Social Determinants of Health   Financial Resource Strain: High Risk (12/15/2022)   Overall Financial Resource Strain (CARDIA)    Difficulty of Paying Living Expenses: Very hard  Food Insecurity: Food Insecurity Present (12/15/2022)   Hunger Vital Sign    Worried About Running Out of Food in the Last Year: Often true    Ran Out of Food in the Last Year: Sometimes true  Transportation Needs: No Transportation Needs (12/15/2022)   PRAPARE - Hydrologist (Medical): No    Lack of Transportation (Non-Medical): No  Physical Activity: Unknown (12/15/2022)   Exercise Vital Sign    Days of Exercise per Week: 0 days    Minutes of Exercise per Session: Not on file  Stress: Stress Concern Present (12/15/2022)   [place]    Feeling of Stress : Rather much  Social Connections: Moderately Isolated (12/15/2022)   Social Connection and Isolation Panel [NHANES]    Frequency of Communication with Friends and Family: More than three times a week    Frequency of Social Gatherings with Friends and Family: Patient declined    Attends  Religious Services: More than 4 times per year    Active Member of [employer] or Organizations: No    Attends Archivist Meetings: Not on file    Marital Status: Widowed    Objective:  BP 132/68   Pulse 78   Temp (!) 97.5 F (36.4 C)   Ht 5\' 6"  (1.676 m)   Wt 157 lb (71.2 kg)   LMP  (LMP Unknown) Comment: hysterectomy 2008  SpO2 100%   BMI 25.34 kg/m      12/17/2022    7:38 AM 12/09/2022    7:41 AM 11/22/2022    8:37 AM  BP/Weight  Systolic BP [id number]  Diastolic BP 68 68 80  Wt. (Lbs) 157 170 166  BMI 25.34 kg/m2 27.44 kg/m2 26.79 kg/m2    Physical Exam Vitals reviewed.  Constitutional:      Appearance: Normal appearance. She is normal weight.  Neck:     Vascular: No carotid bruit.  Cardiovascular:     Rate and Rhythm: Normal rate and regular rhythm.     Heart sounds: Normal heart sounds.  Pulmonary:     Effort: Pulmonary effort is normal. No respiratory distress.     Breath sounds: Normal breath sounds.  Abdominal:     General: Abdomen is flat. Bowel sounds are normal.     Palpations: Abdomen is soft.     Tenderness: There is no abdominal tenderness.  Neurological:  Mental Status: She is alert and oriented to person, place, and time.  Psychiatric:        Mood and Affect: Mood normal.        Behavior: Behavior normal.     Diabetic Foot Exam - Simple   No data filed      Lab Results  Component Value Date   WBC 11.7 (H) 09/09/2022   HGB 16.2 (H) 09/09/2022   HCT 47.2 (H) 09/09/2022   PLT 350 09/09/2022   GLUCOSE 91 09/09/2022   CHOL 291 (H) 09/09/2022   TRIG 173 (H) 09/09/2022   HDL 58 09/09/2022   LDLCALC 201 (H) 09/09/2022   ALT 20 09/09/2022   AST 19 09/09/2022   NA 138 09/09/2022   K 4.3 09/09/2022   CL 98 09/09/2022   CREATININE 0.57 09/09/2022   BUN 11 09/09/2022   CO2 26 09/09/2022   TSH 1.690 09/09/2022   INR 0.9 03/25/2022   HGBA1C 5.4 09/09/2022      Assessment & Plan:    Primary hypertension Assessment &  Plan: Well controlled.  No changes to medicines. Continue On amlodipine 10 mg daily, furosemide 20 mg as needed (rarely takes). Continue to work on eating a healthy diet and exercise.  Labs drawn today.    Orders: -     Comprehensive metabolic panel -     CBC with Differential/Platelet  Mixed hyperlipidemia Assessment & Plan: Well controlled.  No changes to medicines. Repatha Continue to work on eating a healthy diet and exercise.  Labs drawn today.    Orders: -     Lipid panel  Prediabetes Assessment & Plan: Hemoglobin A1c 5.4, 3 month avg of blood sugars, is in prediabetic range.  In order to prevent progression to diabetes, recommend low carb diet and regular exercise    GAD (generalized anxiety disorder) Assessment & Plan: The current medical regimen is effective;  continue present plan and medications. Taking Alprazolam 0.25 mg daily PRN  Orders: -     ALPRAZolam; TAKE 1 TABLET(0.25 MG) BY MOUTH DAILY AS NEEDED FOR ANXIETY  Dispense: 30 tablet; Refill: 2  Gastroesophageal reflux disease without esophagitis Assessment & Plan: Continue on omeprazole 40 mg daily. Continue pepcid 40 mg daily.    Lumbar back pain Assessment & Plan: Continue flexeril to 10 mg three times a day as needed and lyrica 75 mg twice daily.    Orders: -     Cyclobenzaprine HCl; Take 1 tablet (10 mg total) by mouth 3 (three) times daily as needed for muscle spasms.  Dispense: 270 tablet; Refill: 0  Spontaneous bruising Assessment & Plan: Check labs. Referring to Hematologist.  Orders: -     VITAMIN D 25 Hydroxy (Vit-D Deficiency, Fractures) -     Vitamin K1, Serum -     Ambulatory referral to Hematology / Oncology     Meds ordered this encounter  Medications   cyclobenzaprine (FLEXERIL) 10 MG tablet    Sig: Take 1 tablet (10 mg total) by mouth 3 (three) times daily as needed for muscle spasms.    Dispense:  270 tablet    Refill:  0   ALPRAZolam (XANAX) 0.25 MG tablet    Sig:  TAKE 1 TABLET(0.25 MG) BY MOUTH DAILY AS NEEDED FOR ANXIETY    Dispense:  30 tablet    Refill:  2    Orders Placed This Encounter  Procedures   Comprehensive metabolic panel   Lipid panel   CBC with Differential/Platelet   VITAMIN  D 25 Hydroxy (Vit-D Deficiency, Fractures)   Vitamin K1, Serum   Ambulatory referral to Hematology / Oncology     Follow-up: Return in about 3 months (around 03/19/2023) for chronic fasting.   I,Marla I Leal-Borjas,acting as a scribe for Rochel Brome, MD.,have documented all relevant documentation on the behalf of Rochel Brome, MD,as directed by  Rochel Brome, MD while in the presence of Rochel Brome, MD.   An After Visit Summary was printed and given to the patient.  I attest that I have reviewed this visit and agree with the plan scribed by my staff.   Rochel Brome, MD [facility] [phone]

## 2022-12-17 ENCOUNTER — Ambulatory Visit: Payer: Medicaid Other | Admitting: Family Medicine

## 2022-12-17 ENCOUNTER — Encounter: Payer: Self-pay | Admitting: Family Medicine

## 2022-12-17 VITALS — BP 132/68 | HR 78 | Temp 97.5°F | Ht 66.0 in | Wt 157.0 lb

## 2022-12-17 DIAGNOSIS — E782 Mixed hyperlipidemia: Secondary | ICD-10-CM | POA: Diagnosis not present

## 2022-12-17 DIAGNOSIS — M545 Low back pain, unspecified: Secondary | ICD-10-CM | POA: Diagnosis not present

## 2022-12-17 DIAGNOSIS — K219 Gastro-esophageal reflux disease without esophagitis: Secondary | ICD-10-CM | POA: Diagnosis not present

## 2022-12-17 DIAGNOSIS — F411 Generalized anxiety disorder: Secondary | ICD-10-CM | POA: Diagnosis not present

## 2022-12-17 DIAGNOSIS — R7303 Prediabetes: Secondary | ICD-10-CM | POA: Diagnosis not present

## 2022-12-17 DIAGNOSIS — I10 Essential (primary) hypertension: Secondary | ICD-10-CM | POA: Diagnosis not present

## 2022-12-17 DIAGNOSIS — R233 Spontaneous ecchymoses: Secondary | ICD-10-CM

## 2022-12-17 DIAGNOSIS — D692 Other nonthrombocytopenic purpura: Secondary | ICD-10-CM | POA: Diagnosis not present

## 2022-12-17 MED ORDER — ALPRAZOLAM 0.25 MG PO TABS: ORAL_TABLET | ORAL | 2 refills | Status: DC

## 2022-12-17 MED ORDER — CYCLOBENZAPRINE HCL 10 MG PO TABS: 10.0000 mg | ORAL_TABLET | Freq: Three times a day (TID) | ORAL | 0 refills | Status: DC | PRN

## 2022-12-17 NOTE — Assessment & Plan Note (Signed)
Check labs. Referring to Hematologist.

## 2022-12-17 NOTE — Assessment & Plan Note (Signed)
Continue flexeril to 10 mg three times a day as needed and lyrica 75 mg twice daily.

## 2022-12-17 NOTE — Patient Instructions (Addendum)
Recommend coenzyme q10 daily, vitamin d, Calcium citrate with D 1200 mg daily.  Creams to try: cereve, aveeno,

## 2022-12-21 DIAGNOSIS — M5416 Radiculopathy, lumbar region: Secondary | ICD-10-CM | POA: Diagnosis not present

## 2022-12-21 DIAGNOSIS — M5136 Other intervertebral disc degeneration, lumbar region: Secondary | ICD-10-CM | POA: Diagnosis not present

## 2022-12-21 DIAGNOSIS — Z6829 Body mass index (BMI) 29.0-29.9, adult: Secondary | ICD-10-CM | POA: Diagnosis not present

## 2022-12-21 DIAGNOSIS — M47816 Spondylosis without myelopathy or radiculopathy, lumbar region: Secondary | ICD-10-CM | POA: Diagnosis not present

## 2022-12-21 LAB — COMPREHENSIVE METABOLIC PANEL
ALT: 17 IU/L (ref 0–32)
AST: 16 IU/L (ref 0–40)
Albumin/Globulin Ratio: 2 (ref 1.2–2.2)
Albumin: 4.6 g/dL (ref 3.8–4.9)
Alkaline Phosphatase: 82 IU/L (ref 44–121)
BUN/Creatinine Ratio: 19 (ref 9–23)
BUN: 10 mg/dL (ref 6–24)
Bilirubin Total: 0.2 mg/dL (ref 0.0–1.2)
CO2: 23 mmol/L (ref 20–29)
Calcium: 10.1 mg/dL (ref 8.7–10.2)
Chloride: 101 mmol/L (ref 96–106)
Creatinine, Ser: 0.53 mg/dL — ABNORMAL LOW (ref 0.57–1.00)
Globulin, Total: 2.3 g/dL (ref 1.5–4.5)
Glucose: 90 mg/dL (ref 70–99)
Potassium: 4.7 mmol/L (ref 3.5–5.2)
Sodium: 140 mmol/L (ref 134–144)
Total Protein: 6.9 g/dL (ref 6.0–8.5)
eGFR: 108 mL/min/{1.73_m2} (ref >59)

## 2022-12-21 LAB — LIPID PANEL
Chol/HDL Ratio: 3.2 ratio (ref 0.0–4.4)
Cholesterol, Total: 171 mg/dL (ref 100–199)
HDL: 53 mg/dL (ref >39)
LDL Chol Calc (NIH): 91 mg/dL (ref 0–99)
Triglycerides: 157 mg/dL — ABNORMAL HIGH (ref 0–149)
VLDL Cholesterol Cal: 27 mg/dL (ref 5–40)

## 2022-12-21 LAB — CBC WITH DIFFERENTIAL/PLATELET
Basophils Absolute: 0 10*3/uL (ref 0.0–0.2)
Basos: 0 %
EOS (ABSOLUTE): 0.1 10*3/uL (ref 0.0–0.4)
Eos: 2 %
Hematocrit: 44.8 % (ref 34.0–46.6)
Hemoglobin: 15.2 g/dL (ref 11.1–15.9)
Immature Grans (Abs): 0 10*3/uL (ref 0.0–0.1)
Immature Granulocytes: 0 %
Lymphocytes Absolute: 2.4 10*3/uL (ref 0.7–3.1)
Lymphs: 31 %
MCH: 32.8 pg (ref 26.6–33.0)
MCHC: 33.9 g/dL (ref 31.5–35.7)
MCV: 97 fL (ref 79–97)
Monocytes Absolute: 0.6 10*3/uL (ref 0.1–0.9)
Monocytes: 7 %
Neutrophils Absolute: 4.7 10*3/uL (ref 1.4–7.0)
Neutrophils: 60 %
Platelets: 348 10*3/uL (ref 150–450)
RBC: 4.63 x10E6/uL (ref 3.77–5.28)
RDW: 12.3 % (ref 11.7–15.4)
WBC: 7.8 10*3/uL (ref 3.4–10.8)

## 2022-12-21 LAB — CARDIOVASCULAR RISK ASSESSMENT

## 2022-12-21 LAB — VITAMIN D 25 HYDROXY (VIT D DEFICIENCY, FRACTURES): Vit D, 25-Hydroxy: 56 ng/mL (ref 30.0–100.0)

## 2022-12-21 LAB — VITAMIN K1, SERUM: VITAMIN K1: <0.1 ng/mL — ABNORMAL LOW (ref 0.10–2.20)

## 2022-12-26 ENCOUNTER — Other Ambulatory Visit: Payer: Self-pay | Admitting: Family Medicine

## 2023-01-03 DIAGNOSIS — M5416 Radiculopathy, lumbar region: Secondary | ICD-10-CM | POA: Diagnosis not present

## 2023-01-04 ENCOUNTER — Other Ambulatory Visit: Payer: Self-pay

## 2023-01-04 ENCOUNTER — Encounter: Payer: Self-pay | Admitting: Oncology

## 2023-01-04 ENCOUNTER — Inpatient Hospital Stay: Payer: Medicaid Other | Attending: Oncology | Admitting: Oncology

## 2023-01-04 ENCOUNTER — Inpatient Hospital Stay: Payer: Medicaid Other

## 2023-01-04 ENCOUNTER — Telehealth: Payer: Self-pay

## 2023-01-04 VITALS — BP 129/75 | HR 84 | Temp 98.4°F | Resp 14 | Ht 65.8 in | Wt 166.0 lb

## 2023-01-04 DIAGNOSIS — Z5986 Financial insecurity: Secondary | ICD-10-CM | POA: Diagnosis not present

## 2023-01-04 DIAGNOSIS — Z8261 Family history of arthritis: Secondary | ICD-10-CM | POA: Diagnosis not present

## 2023-01-04 DIAGNOSIS — R233 Spontaneous ecchymoses: Secondary | ICD-10-CM

## 2023-01-04 DIAGNOSIS — E785 Hyperlipidemia, unspecified: Secondary | ICD-10-CM | POA: Insufficient documentation

## 2023-01-04 DIAGNOSIS — K029 Dental caries, unspecified: Secondary | ICD-10-CM

## 2023-01-04 DIAGNOSIS — I10 Essential (primary) hypertension: Secondary | ICD-10-CM | POA: Diagnosis not present

## 2023-01-04 DIAGNOSIS — T148XXA Other injury of unspecified body region, initial encounter: Secondary | ICD-10-CM

## 2023-01-04 DIAGNOSIS — L03211 Cellulitis of face: Secondary | ICD-10-CM | POA: Diagnosis not present

## 2023-01-04 DIAGNOSIS — Z87442 Personal history of urinary calculi: Secondary | ICD-10-CM | POA: Diagnosis not present

## 2023-01-04 DIAGNOSIS — Z90710 Acquired absence of both cervix and uterus: Secondary | ICD-10-CM | POA: Insufficient documentation

## 2023-01-04 DIAGNOSIS — Z9289 Personal history of other medical treatment: Secondary | ICD-10-CM | POA: Diagnosis not present

## 2023-01-04 DIAGNOSIS — Z881 Allergy status to other antibiotic agents status: Secondary | ICD-10-CM | POA: Diagnosis not present

## 2023-01-04 DIAGNOSIS — F17210 Nicotine dependence, cigarettes, uncomplicated: Secondary | ICD-10-CM | POA: Diagnosis not present

## 2023-01-04 DIAGNOSIS — K219 Gastro-esophageal reflux disease without esophagitis: Secondary | ICD-10-CM | POA: Insufficient documentation

## 2023-01-04 DIAGNOSIS — Z8719 Personal history of other diseases of the digestive system: Secondary | ICD-10-CM | POA: Insufficient documentation

## 2023-01-04 DIAGNOSIS — Z8349 Family history of other endocrine, nutritional and metabolic diseases: Secondary | ICD-10-CM | POA: Insufficient documentation

## 2023-01-04 DIAGNOSIS — Z5941 Food insecurity: Secondary | ICD-10-CM | POA: Insufficient documentation

## 2023-01-04 DIAGNOSIS — Z885 Allergy status to narcotic agent status: Secondary | ICD-10-CM | POA: Diagnosis not present

## 2023-01-04 DIAGNOSIS — J449 Chronic obstructive pulmonary disease, unspecified: Secondary | ICD-10-CM | POA: Diagnosis not present

## 2023-01-04 DIAGNOSIS — Z7951 Long term (current) use of inhaled steroids: Secondary | ICD-10-CM | POA: Insufficient documentation

## 2023-01-04 DIAGNOSIS — Z800 Family history of malignant neoplasm of digestive organs: Secondary | ICD-10-CM | POA: Insufficient documentation

## 2023-01-04 DIAGNOSIS — Z833 Family history of diabetes mellitus: Secondary | ICD-10-CM | POA: Insufficient documentation

## 2023-01-04 DIAGNOSIS — B029 Zoster without complications: Secondary | ICD-10-CM | POA: Insufficient documentation

## 2023-01-04 DIAGNOSIS — Z8249 Family history of ischemic heart disease and other diseases of the circulatory system: Secondary | ICD-10-CM | POA: Diagnosis not present

## 2023-01-04 DIAGNOSIS — Z720 Tobacco use: Secondary | ICD-10-CM

## 2023-01-04 DIAGNOSIS — Z79899 Other long term (current) drug therapy: Secondary | ICD-10-CM | POA: Insufficient documentation

## 2023-01-04 DIAGNOSIS — Z825 Family history of asthma and other chronic lower respiratory diseases: Secondary | ICD-10-CM | POA: Insufficient documentation

## 2023-01-04 DIAGNOSIS — M2762 Post-osseointegration biological failure of dental implant: Secondary | ICD-10-CM

## 2023-01-04 DIAGNOSIS — Z803 Family history of malignant neoplasm of breast: Secondary | ICD-10-CM | POA: Diagnosis not present

## 2023-01-04 DIAGNOSIS — Z823 Family history of stroke: Secondary | ICD-10-CM | POA: Insufficient documentation

## 2023-01-04 DIAGNOSIS — Z820 Family history of epilepsy and other diseases of the nervous system: Secondary | ICD-10-CM | POA: Insufficient documentation

## 2023-01-04 LAB — CBC WITH DIFFERENTIAL (CANCER CENTER ONLY)
Abs Immature Granulocytes: 0.04 10*3/uL (ref 0.00–0.07)
Basophils Absolute: 0 10*3/uL (ref 0.0–0.1)
Basophils Relative: 0 %
Eosinophils Absolute: 0 10*3/uL (ref 0.0–0.5)
Eosinophils Relative: 0 %
HCT: 41.3 % (ref 36.0–46.0)
Hemoglobin: 13.8 g/dL (ref 12.0–15.0)
Immature Granulocytes: 0 %
Lymphocytes Relative: 20 %
Lymphs Abs: 2.6 10*3/uL (ref 0.7–4.0)
MCH: 32.6 pg (ref 26.0–34.0)
MCHC: 33.4 g/dL (ref 30.0–36.0)
MCV: 97.6 fL (ref 80.0–100.0)
Monocytes Absolute: 0.8 10*3/uL (ref 0.1–1.0)
Monocytes Relative: 6 %
Neutro Abs: 9.4 10*3/uL — ABNORMAL HIGH (ref 1.7–7.7)
Neutrophils Relative %: 74 %
Platelet Count: 334 10*3/uL (ref 150–400)
RBC: 4.23 MIL/uL (ref 3.87–5.11)
RDW: 12.8 % (ref 11.5–15.5)
WBC Count: 12.9 10*3/uL — ABNORMAL HIGH (ref 4.0–10.5)
nRBC: 0 % (ref 0.0–0.2)

## 2023-01-04 LAB — FIBRINOGEN: Fibrinogen: 400 mg/dL (ref 210–475)

## 2023-01-04 LAB — HEPATITIS PANEL, ACUTE
HCV Ab: NONREACTIVE
Hep A IgM: NONREACTIVE
Hep B C IgM: NONREACTIVE
Hepatitis B Surface Ag: NONREACTIVE

## 2023-01-04 LAB — APTT: aPTT: 29 seconds (ref 24–36)

## 2023-01-04 LAB — D-DIMER, QUANTITATIVE: D-Dimer, Quant: 0.68 ug/mL-FEU — ABNORMAL HIGH (ref 0.00–0.50)

## 2023-01-04 LAB — PROTIME-INR
INR: 0.8 (ref 0.8–1.2)
Prothrombin Time: 11.5 seconds (ref 11.4–15.2)

## 2023-01-04 NOTE — Patient Instructions (Signed)
Please continue taking Vitamin K I recommend that you hold on dental surgery, at least until we have completed the evaluation and treatment of your bruising
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
## 2023-01-04 NOTE — Telephone Encounter (Signed)
Patient has been scheduled for follow-up visit per 01/04/23 LOS.  Pt given an appt calendar with date and time.

## 2023-01-04 NOTE — Telephone Encounter (Signed)
[name] returned call and is coming in for additional blood work.

## 2023-01-05 LAB — VON WILLEBRAND PANEL
Coagulation Factor VIII: 124 % (ref 56–140)
Ristocetin Co-factor, Plasma: 64 % (ref 50–200)
Von Willebrand Antigen, Plasma: 72 % (ref 50–200)

## 2023-01-05 LAB — COAG STUDIES INTERP REPORT

## 2023-01-07 ENCOUNTER — Encounter: Payer: Self-pay | Admitting: Family Medicine

## 2023-01-07 ENCOUNTER — Ambulatory Visit (INDEPENDENT_AMBULATORY_CARE_PROVIDER_SITE_OTHER): Payer: Medicaid Other | Admitting: Family Medicine

## 2023-01-07 VITALS — BP 128/68 | HR 98 | Temp 97.1°F | Ht 66.0 in | Wt 165.0 lb

## 2023-01-07 DIAGNOSIS — L03211 Cellulitis of face: Secondary | ICD-10-CM | POA: Diagnosis not present

## 2023-01-07 MED ORDER — CLINDAMYCIN HCL 300 MG PO CAPS
300.0000 mg | ORAL_CAPSULE | Freq: Four times a day (QID) | ORAL | 0 refills | Status: DC
Start: 2023-01-07 — End: 2023-02-01

## 2023-01-07 MED ORDER — CEFTRIAXONE SODIUM 1 G IJ SOLR
1.0000 g | Freq: Once | INTRAMUSCULAR | Status: AC
Start: 2023-01-07 — End: 2023-01-07
  Administered 2023-01-07: 1 g via INTRAMUSCULAR

## 2023-01-07 NOTE — Progress Notes (Unsigned)
Acute Office Visit  Subjective:    Patient ID: [name], female    DOB: 02-Aug-1966, 57 y.o.   MRN: [id number]  Chief Complaint  Patient presents with   Facial Swelling    HPI: Patient is in today for left sided facial swelling noticed this morning, somewhat tender, has not been to dentist in years in the past has had 2 rot canals, no fever or chills. States WBC was elevated. Hx of facial cellulitis.  Past Medical History:  Diagnosis Date   Allergic rhinitis    Arthritis    Blood transfusion without reported diagnosis    Cellulitis, face 06/08/2022   COPD (chronic obstructive pulmonary disease)    Dental abscess 06/09/2022   GERD (gastroesophageal reflux disease)    History of colon polyps    HTN (hypertension)    Hyperlipidemia    Kidney stones    Pancreatitis    Pneumonia    Primary hypertension 10/20/2021   Psychotic depression    Shingles February 26th, 2001    Past Surgical History:  Procedure Laterality Date   ABDOMINAL HYSTERECTOMY     CESAREAN SECTION     COLONOSCOPY  05/02/2017   Colonic polyp status post polypectomy. Small internal hemorrhoids   GALLBLADDER SURGERY     REPLACEMENT TOTAL KNEE Right 02/09/2019   TOTAL KNEE ARTHROPLASTY  02/09/2019   TUBAL LIGATION      Family History  Problem Relation Age of Onset   COPD Mother    Parkinson's disease Mother    COPD Father    Arthritis Father    Lung disease Father    Colon cancer Cousin        mother's cousin   Breast cancer Maternal Aunt        great aunt   Diabetes Other    Stroke Other    Hypertension Other    Hyperlipidemia Other    Asthma Other    Heart failure Other    Thyroid disease Other    Heart attack Other    COPD Other    Arrhythmia Other    Arthritis Other    Migraines Other     Social History   Socioeconomic History   Marital status: Widowed    Spouse name: Not on file   Number of children: 2   Years of education: Not on file   Highest education level: GED  or equivalent  Occupational History   Not on file  Tobacco Use   Smoking status: Every Day    Packs/day: 1.00    Years: 20.00    Additional pack years: 0.00    Total pack years: 20.00    Types: Cigarettes   Smokeless tobacco: Never  Vaping Use   Vaping Use: Never used  Substance and Sexual Activity   Alcohol use: Yes    Comment: occas   Drug use: No   Sexual activity: Not Currently  Other Topics Concern   Not on file  Social History Narrative   Not on file   Social Determinants of Health   Financial Resource Strain: High Risk (12/15/2022)   Overall Financial Resource Strain (CARDIA)    Difficulty of Paying Living Expenses: Very hard  Food Insecurity: Food Insecurity Present (12/15/2022)   Hunger Vital Sign    Worried About Running Out of Food in the Last Year: Often true    Ran Out of Food in the Last Year: Sometimes true  Transportation Needs: No Transportation Needs (12/15/2022)   PRAPARE -  Administrator, Civil Service (Medical): No    Lack of Transportation (Non-Medical): No  Physical Activity: Unknown (12/15/2022)   Exercise Vital Sign    Days of Exercise per Week: 0 days    Minutes of Exercise per Session: Not on file  Stress: Stress Concern Present (12/15/2022)   [employer] of Occupational Health - Occupational Stress Questionnaire    Feeling of Stress : Rather much  Social Connections: Moderately Isolated (12/15/2022)   Social Connection and Isolation Panel [NHANES]    Frequency of Communication with Friends and Family: More than three times a week    Frequency of Social Gatherings with Friends and Family: Patient declined    Attends Religious Services: More than 4 times per year    Active Member of [employer] or Organizations: No    Attends Banker Meetings: Not on file    Marital Status: Widowed  Intimate Partner Violence: Not on file    Outpatient Medications Prior to Visit  Medication Sig Dispense Refill   acetaminophen (TYLENOL)  325 MG tablet Take 2 tablets (650 mg total) by mouth every 6 (six) hours as needed for mild pain (or Fever >/= 101). 20 tablet 0   albuterol (VENTOLIN HFA) 108 (90 Base) MCG/ACT inhaler INHALE 2 PUFFS INTO LUNGS EVERY 6 TO 8 HOURS AS NEEDED FOR WHEEZING 18 g 1   ALPRAZolam (XANAX) 0.25 MG tablet TAKE 1 TABLET(0.25 MG) BY MOUTH DAILY AS NEEDED FOR ANXIETY 30 tablet 2   amLODipine (NORVASC) 10 MG tablet TAKE 1 TABLET BY MOUTH DAILY 90 tablet 1   buPROPion (WELLBUTRIN XL) 300 MG 24 hr tablet Take 1 tablet (300 mg total) by mouth daily. 90 tablet 1   chlorhexidine (PERIDEX) 0.12 % solution SMARTSIG:By Mouth     Cholecalciferol (VITAMIN D3) 10 MCG (400 UNIT) tablet Take 400 Units by mouth daily.     cyclobenzaprine (FLEXERIL) 10 MG tablet Take 1 tablet (10 mg total) by mouth 3 (three) times daily as needed for muscle spasms. 270 tablet 0   dicyclomine (BENTYL) 10 MG capsule TAKE 1 CAPSULE(10 MG) BY MOUTH FOUR TIMES DAILY BEFORE MEALS AND AT BEDTIME 120 capsule 2   Docusate Sodium (DSS) 100 MG CAPS Take by mouth.     Evolocumab (REPATHA SURECLICK) 140 MG/ML SOAJ INJECT 140 MG INTO THE SKIN EVERY 14 DAYS 2 mL 2   famotidine (PEPCID) 40 MG tablet TAKE 1 TABLET(40 MG) BY MOUTH DAILY 90 tablet 1   fluticasone (FLONASE) 50 MCG/ACT nasal spray SHAKE LIQUID AND USE 2 SPRAYS IN EACH NOSTRIL DAILY 16 g 6   fluticasone-salmeterol (ADVAIR HFA) 115-21 MCG/ACT inhaler Inhale 2 puffs into the lungs 2 (two) times daily. 3 each 3   furosemide (LASIX) 20 MG tablet Take 1 tablet (20 mg total) by mouth daily as needed for edema. 1 tablet 0   Magnesium 400 MG CAPS Take by mouth.     montelukast (SINGULAIR) 10 MG tablet TAKE 1 TABLET(10 MG) BY MOUTH AT BEDTIME 90 tablet 0   nicotine (NICODERM CQ - DOSED IN MG/24 HOURS) 21 mg/24hr patch APPLY 1 PATCH(21 MG) TOPICALLY TO THE SKIN DAILY 28 patch 0   nitroGLYCERIN (NITROSTAT) 0.4 MG SL tablet DISSOLVE 1 TABLET BY MOUTH UNDER THE TONGUE AS NEEDED FOR CHEST PAIN EVERY 5 MINUTES UP  TO 3 TIMES 25 tablet 0   omeprazole (PRILOSEC) 40 MG capsule Take 1 capsule (40 mg total) by mouth daily. 90 capsule 3   OZEMPIC, 2 MG/DOSE, 8  MG/3ML SOPN INJECT 2 MG AS DIRECTED ONCE A WEEK 9 mL 0   triamcinolone (KENALOG) 0.025 % ointment Apply 1 Application topically 2 (two) times daily. Apply to corners of mouth twice daily 30 g 0   Vitamin E (VITAMIN E/D-ALPHA NATURAL) 268 MG (400 UNIT) CAPS Take 400 Units by mouth daily. (Patient not taking: Reported on 01/04/2023)     vitamin E 180 MG (400 UNITS) capsule Take by mouth. (Patient not taking: Reported on 01/04/2023)     vitamin k 100 MCG tablet Take 100 mcg by mouth daily.     No facility-administered medications prior to visit.    Allergies  Allergen Reactions   Levofloxacin Other (See Comments) and Rash    Rash all over redness   Bactrim Ds [Sulfamethoxazole-Trimethoprim] Nausea Only   Levaquin [Levofloxacin In D5w]    Lipitor [Atorvastatin] Other (See Comments)    Myalgia    Tape Hives   Zetia [Ezetimibe]     Muscle pain   Codeine Nausea Only   Other Rash    bandaids leave a red area if left on too long    Review of Systems  Constitutional:  Negative for chills and fever.  HENT:  Positive for facial swelling.   Gastrointestinal:  Negative for nausea.  Musculoskeletal:  Negative for neck pain.       Objective:        01/07/2023   11:19 AM 01/04/2023    8:49 AM 12/17/2022    7:38 AM  Vitals with BMI  Height 5\' 6"  5' 5.8" 5\' 6"   Weight 165 lbs 166 lbs 157 lbs  BMI 26.64 26.97 25.35  Systolic 128 129 161  Diastolic 68 75 68  Pulse 98 84 78    No data found.   Physical Exam Vitals reviewed.  Constitutional:      Appearance: Normal appearance. She is normal weight.  HENT:     Right Ear: Tympanic membrane normal.     Left Ear: Tympanic membrane normal.     Nose: Congestion present.     Mouth/Throat:     Pharynx: No oropharyngeal exudate or posterior oropharyngeal erythema.     Comments: Left lower jaw  swelling. No erythema.  Neck:     Vascular: No carotid bruit.  Cardiovascular:     Rate and Rhythm: Normal rate and regular rhythm.     Heart sounds: Normal heart sounds.  Pulmonary:     Effort: Pulmonary effort is normal. No respiratory distress.     Breath sounds: Normal breath sounds.  Abdominal:     General: Abdomen is flat. Bowel sounds are normal.     Palpations: Abdomen is soft.     Tenderness: There is no abdominal tenderness.  Neurological:     Mental Status: She is alert and oriented to person, place, and time.  Psychiatric:        Mood and Affect: Mood normal.        Behavior: Behavior normal.     Health Maintenance Due  Topic Date Due   Lung Cancer Screening  12/31/2022    There are no preventive care reminders to display for this patient.   Lab Results  Component Value Date   TSH 1.690 09/09/2022   Lab Results  Component Value Date   WBC 12.9 (H) 01/04/2023   HGB 13.8 01/04/2023   HCT 41.3 01/04/2023   MCV 97.6 01/04/2023   PLT 334 01/04/2023   Lab Results  Component Value Date   NA 140  12/17/2022   K 4.7 12/17/2022   CO2 23 12/17/2022   GLUCOSE 90 12/17/2022   BUN 10 12/17/2022   CREATININE 0.53 (L) 12/17/2022   BILITOT 0.2 12/17/2022   ALKPHOS 82 12/17/2022   AST 16 12/17/2022   ALT 17 12/17/2022   PROT 6.9 12/17/2022   ALBUMIN 4.6 12/17/2022   CALCIUM 10.1 12/17/2022   ANIONGAP 8 06/13/2022   EGFR 108 12/17/2022   Lab Results  Component Value Date   CHOL 171 12/17/2022   Lab Results  Component Value Date   HDL 53 12/17/2022   Lab Results  Component Value Date   LDLCALC 91 12/17/2022   Lab Results  Component Value Date   TRIG 157 (H) 12/17/2022   Lab Results  Component Value Date   CHOLHDL 3.2 12/17/2022   Lab Results  Component Value Date   HGBA1C 5.4 09/09/2022       Assessment & Plan:  Cellulitis, face Assessment & Plan: Ordered Rocephin 1g IM #1. Sent Clindamycin 300 mg PO four times a day.  Orders: -      cefTRIAXone Sodium -     Clindamycin HCl; Take 1 capsule (300 mg total) by mouth 4 (four) times daily.  Dispense: 28 capsule; Refill: 0     Meds ordered this encounter  Medications   cefTRIAXone (ROCEPHIN) injection 1 g   clindamycin (CLEOCIN) 300 MG capsule    Sig: Take 1 capsule (300 mg total) by mouth 4 (four) times daily.    Dispense:  28 capsule    Refill:  0    No orders of the defined types were placed in this encounter.    Follow-up: No follow-ups on file.  An After Visit Summary was printed and given to the patient.   Clayborn Bigness I Leal-Borjas,acting as a scribe for Blane Ohara, MD.,have documented all relevant documentation on the behalf of Blane Ohara, MD,as directed by  Blane Ohara, MD while in the presence of Blane Ohara, MD.   I attest that I have reviewed this visit and agree with the plan scribed by my staff.   Blane Ohara, MD [facility] [phone]

## 2023-01-08 LAB — VITAMIN K1, SERUM: VITAMIN K1: 2.21 ng/mL — ABNORMAL HIGH (ref 0.10–2.20)

## 2023-01-08 NOTE — Assessment & Plan Note (Signed)
Ordered Rocephin 1g IM #1. Sent Clindamycin 300 mg PO four times a day.

## 2023-01-10 ENCOUNTER — Other Ambulatory Visit: Payer: Self-pay | Admitting: Family Medicine

## 2023-01-10 DIAGNOSIS — F17219 Nicotine dependence, cigarettes, with unspecified nicotine-induced disorders: Secondary | ICD-10-CM

## 2023-01-11 ENCOUNTER — Inpatient Hospital Stay: Payer: Medicaid Other

## 2023-01-11 ENCOUNTER — Telehealth: Payer: Self-pay | Admitting: Oncology

## 2023-01-11 ENCOUNTER — Inpatient Hospital Stay (INDEPENDENT_AMBULATORY_CARE_PROVIDER_SITE_OTHER): Payer: Medicaid Other | Admitting: Oncology

## 2023-01-11 DIAGNOSIS — M2762 Post-osseointegration biological failure of dental implant: Secondary | ICD-10-CM

## 2023-01-11 DIAGNOSIS — D691 Qualitative platelet defects: Secondary | ICD-10-CM

## 2023-01-11 DIAGNOSIS — Z9289 Personal history of other medical treatment: Secondary | ICD-10-CM | POA: Diagnosis not present

## 2023-01-11 DIAGNOSIS — R233 Spontaneous ecchymoses: Secondary | ICD-10-CM

## 2023-01-11 DIAGNOSIS — T148XXA Other injury of unspecified body region, initial encounter: Secondary | ICD-10-CM

## 2023-01-11 NOTE — Patient Instructions (Signed)
Please hold on taking all supplements for the next week to see if your bruising improves

## 2023-01-11 NOTE — Progress Notes (Signed)
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 documenting clinical information in the electronic or other health record, independently interpreting results and communicating results to the patient as well as coordination of care.       All questions were answered. The patient knows to call the clinic with any problems, questions or concerns.  This note was electronically signed.    Loni Muse, MD  01/11/2023 10:44 AM

## 2023-01-11 NOTE — Telephone Encounter (Signed)
01/11/23 Next appt scheduled and confirmed with patient 

## 2023-01-12 ENCOUNTER — Telehealth: Payer: Self-pay | Admitting: Gastroenterology

## 2023-01-12 NOTE — Telephone Encounter (Signed)
Patient called requesting to speak with a nurse regarding abdominal pain she has been experiencing.

## 2023-01-13 DIAGNOSIS — D691 Qualitative platelet defects: Secondary | ICD-10-CM | POA: Insufficient documentation

## 2023-01-13 NOTE — Telephone Encounter (Signed)
Spoke with patient & she states pain is better today. She was previously having mid, upper abdominal pain for 3-4 days, and started taking gaviscon PRN which has provided relief. She is also currently on omeprazole QD. Due to her history of a hiatal hernia & recent symptoms, she plans to keep appointment on 01/17/23 at 1:30 pm with Jill Side, NP to discuss further.

## 2023-01-17 ENCOUNTER — Telehealth: Payer: Self-pay

## 2023-01-17 ENCOUNTER — Ambulatory Visit: Payer: Medicaid Other | Admitting: Nurse Practitioner

## 2023-01-17 NOTE — Telephone Encounter (Signed)
(  Per staff message from Tamala)(copied)  [name] Clinical FYI:  Novo Nordisk patient assistance program notification:  120- day supply of Ozempic 2mg /ml was filled on 01/04/2023 and should arrive to the office in 10-14 business days. Patient has 1  refill remaining and enrollment will expire on 09/30/2023.    Billee Cashing, CMA Clinical Pharmacist Assistant [phone]

## 2023-01-19 DIAGNOSIS — Z0000 Encounter for general adult medical examination without abnormal findings: Secondary | ICD-10-CM | POA: Diagnosis not present

## 2023-01-19 DIAGNOSIS — R8290 Unspecified abnormal findings in urine: Secondary | ICD-10-CM | POA: Diagnosis not present

## 2023-01-19 DIAGNOSIS — Z1231 Encounter for screening mammogram for malignant neoplasm of breast: Secondary | ICD-10-CM | POA: Diagnosis not present

## 2023-01-19 DIAGNOSIS — N760 Acute vaginitis: Secondary | ICD-10-CM | POA: Diagnosis not present

## 2023-01-24 ENCOUNTER — Other Ambulatory Visit: Payer: Self-pay | Admitting: Family Medicine

## 2023-01-27 DIAGNOSIS — Z0120 Encounter for dental examination and cleaning without abnormal findings: Secondary | ICD-10-CM | POA: Diagnosis not present

## 2023-01-28 DIAGNOSIS — Z6828 Body mass index (BMI) 28.0-28.9, adult: Secondary | ICD-10-CM | POA: Diagnosis not present

## 2023-01-28 DIAGNOSIS — M5136 Other intervertebral disc degeneration, lumbar region: Secondary | ICD-10-CM | POA: Diagnosis not present

## 2023-02-01 ENCOUNTER — Other Ambulatory Visit: Payer: Self-pay | Admitting: Neurosurgery

## 2023-02-01 ENCOUNTER — Inpatient Hospital Stay: Payer: Medicaid Other

## 2023-02-01 ENCOUNTER — Inpatient Hospital Stay: Payer: Medicaid Other | Attending: Oncology | Admitting: Oncology

## 2023-02-01 DIAGNOSIS — M545 Low back pain, unspecified: Secondary | ICD-10-CM

## 2023-02-01 DIAGNOSIS — D691 Qualitative platelet defects: Secondary | ICD-10-CM

## 2023-02-01 DIAGNOSIS — R233 Spontaneous ecchymoses: Secondary | ICD-10-CM | POA: Diagnosis not present

## 2023-02-01 LAB — CBC AND DIFFERENTIAL
HCT: 44 (ref 36–46)
Hemoglobin: 15.4 (ref 12.0–16.0)
Neutrophils Absolute: 4.6
Platelets: 297 10*3/uL (ref 150–400)
WBC: 7.3

## 2023-02-01 LAB — CBC: RBC: 4.65 (ref 3.87–5.11)
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
## 2023-02-03 ENCOUNTER — Other Ambulatory Visit: Payer: Self-pay

## 2023-02-03 DIAGNOSIS — J309 Allergic rhinitis, unspecified: Secondary | ICD-10-CM

## 2023-02-03 MED ORDER — FLUTICASONE PROPIONATE 50 MCG/ACT NA SUSP
NASAL | 6 refills | Status: DC
Start: 2023-02-03 — End: 2023-10-20

## 2023-02-10 ENCOUNTER — Other Ambulatory Visit: Payer: Self-pay | Admitting: Neurosurgery

## 2023-02-15 ENCOUNTER — Other Ambulatory Visit: Payer: Self-pay | Admitting: Family Medicine

## 2023-02-15 ENCOUNTER — Encounter: Payer: Self-pay | Admitting: Family Medicine

## 2023-02-15 MED ORDER — LUBIPROSTONE 8 MCG PO CAPS
8.0000 ug | ORAL_CAPSULE | Freq: Two times a day (BID) | ORAL | 1 refills | Status: DC
Start: 2023-02-15 — End: 2023-04-17

## 2023-02-18 NOTE — Pre-Procedure Instructions (Signed)
Surgical Instructions    Your procedure is scheduled on Monday, February 28, 2023 at 11:52 AM.  Report to [facility] Main Entrance "A" at 10:00 A.M., then check in with the Admitting office.  Call this number if you have problems the morning of surgery:  [phone]   If you have any questions prior to your surgery date call [phone]: Open Monday-Friday 8am-4pm  *If you experience any cold or flu symptoms such as cough, fever, chills, shortness of breath, etc. between now and your scheduled surgery, please notify us.*    Remember:  Do not eat or drink after midnight the night before your surgery    Take these medicines the morning of surgery with A SIP OF WATER:  amLODipine (NORVASC)  buPROPion (WELLBUTRIN XL)  fluticasone (FLONASE)  montelukast (SINGULAIR)  omeprazole (PRILOSEC)   IF NEEDED: acetaminophen (TYLENOL)  albuterol (VENTOLIN HFA)  ALPRAZolam (XANAX)  cyclobenzaprine (FLEXERIL)  dicyclomine (BENTYL)  famotidine (PEPCID)  fluticasone-salmeterol (ADVAIR HFA) HYDROcodone-acetaminophen (NORCO/VICODIN)   nitroGLYCERIN (NITROSTAT)   Please bring all inhalers with you the day of surgery.   *Stop taking OZEMPIC 7 days prior to surgery. Your last dose should be on 02/20/23.*   As of today, STOP taking any Aspirin (unless otherwise instructed by your surgeon) Aleve, Naproxen, Ibuprofen, Motrin, Advil, Goody's, BC's, all herbal medications, fish oil, and all vitamins.                     Do NOT Smoke (Tobacco/Vaping) for 24 hours prior to your procedure.  If you use a CPAP at night, you may bring your mask/headgear for your overnight stay.   Contacts, glasses, piercing's, hearing aid's, dentures or partials may not be worn into surgery, please bring cases for these belongings.    For patients admitted to the hospital, discharge time will be determined by your treatment team.   Patients discharged the day of surgery will not be allowed to drive home, and someone  needs to stay with them for 24 hours.  SURGICAL WAITING ROOM VISITATION Patients having surgery or a procedure may have 2 support people in the waiting area. Visitors may stay in the waiting area during the procedure and switch out with other visitors if needed. Only 1 support person is allowed in the pre-op area with the patient AFTER the patient is prepped. This person cannot be switched out.  Children under the age of 65 must have an adult accompany them who is not the patient. If the patient needs to stay at the hospital during part of their recovery, the visitor guidelines for inpatient rooms apply.  Please refer to the [facility] website for the visitor guidelines for Inpatients (after your surgery is over and you are in a regular room).   Oral Hygiene is also important to reduce your risk of infection.  Remember - BRUSH YOUR TEETH THE MORNING OF SURGERY WITH YOUR REGULAR TOOTHPASTE    Pre-operative 5 CHG Bath Instructions   You can play a key role in reducing the risk of infection after surgery. Your skin needs to be as free of germs as possible. You can reduce the number of germs on your skin by washing with CHG (chlorhexidine gluconate) soap before surgery. CHG is an antiseptic soap that kills germs and continues to kill germs even after washing.   DO NOT use if you have an allergy to chlorhexidine/CHG or antibacterial soaps. If your skin becomes reddened or irritated, stop using the CHG and notify one of  our RNs at [phone].   Please shower with the CHG soap starting 4 days before surgery using the following schedule:     Please keep in mind the following:  DO NOT shave, including legs and underarms, starting the day of your first shower.   You may shave your face at any point before/day of surgery.  Place clean sheets on your bed the day you start using CHG soap. Use a clean washcloth (not used since being washed) for each shower. DO NOT sleep with pets once you start  using the CHG.   CHG Shower Instructions:  If you choose to wash your hair and private area, wash first with your normal shampoo/soap.  After you use shampoo/soap, rinse your hair and body thoroughly to remove shampoo/soap residue.  Turn the water OFF and apply about 3 tablespoons (45 ml) of CHG soap to a CLEAN washcloth.  Apply CHG soap ONLY FROM YOUR NECK DOWN TO YOUR TOES (washing for 3-5 minutes)  DO NOT use CHG soap on face, private areas, open wounds, or sores.  Pay special attention to the area where your surgery is being performed.  If you are having back surgery, having someone wash your back for you may be helpful. Wait 2 minutes after CHG soap is applied, then you may rinse off the CHG soap.  Pat dry with a clean towel  Put on clean clothes/pajamas   If you choose to wear lotion, please use ONLY the CHG-compatible lotions on the back of this paper.     Additional instructions for the day of surgery: DO NOT APPLY any lotions, deodorants, cologne, or perfumes.  Do not wear jewelry or makeup  Do not wear nail polish, gel polish, artificial nails, or any other type of covering on natural nails (fingers and toes). If you have artificial nails or gel coating that need to be removed by a nail salon, please have this removed prior to surgery. Artificial nails or gel coating may interfere with anesthesia's ability to adequately monitor your vital signs. Do not bring valuables to the hospital.  [facility] is not responsible for any belongings or valuables. Put on clean/comfortable clothes.  Brush your teeth.  Ask your nurse before applying any prescription medications to the skin.     CHG Compatible Lotions   Aveeno Moisturizing lotion  Cetaphil Moisturizing Cream  Cetaphil Moisturizing Lotion  Clairol Herbal Essence Moisturizing Lotion, Dry Skin  Clairol Herbal Essence Moisturizing Lotion, Extra Dry Skin  Clairol Herbal Essence Moisturizing Lotion, Normal Skin  Curel Age  Defying Therapeutic Moisturizing Lotion with Alpha Hydroxy  Curel Extreme Care Body Lotion  Curel Soothing Hands Moisturizing Hand Lotion  Curel Therapeutic Moisturizing Cream, Fragrance-Free  Curel Therapeutic Moisturizing Lotion, Fragrance-Free  Curel Therapeutic Moisturizing Lotion, Original Formula  Eucerin Daily Replenishing Lotion  Eucerin Dry Skin Therapy Plus Alpha Hydroxy Crme  Eucerin Dry Skin Therapy Plus Alpha Hydroxy Lotion  Eucerin Original Crme  Eucerin Original Lotion  Eucerin Plus Crme Eucerin Plus Lotion  Eucerin TriLipid Replenishing Lotion  Keri Anti-Bacterial Hand Lotion  Keri Deep Conditioning Original Lotion Dry Skin Formula Softly Scented  Keri Deep Conditioning Original Lotion, Fragrance Free Sensitive Skin Formula  Keri Lotion Fast Absorbing Fragrance Free Sensitive Skin Formula  Keri Lotion Fast Absorbing Softly Scented Dry Skin Formula  Keri Original Lotion  Keri Skin Renewal Lotion Keri Silky Smooth Lotion  Keri Silky Smooth Sensitive Skin Lotion  Nivea Body Creamy Conditioning Oil  Nivea Body Extra Enriched Lotion  Tiburones  Body Original Lotion  Nivea Body Sheer Moisturizing Lotion Nivea Crme  Nivea Skin Firming Lotion  NutraDerm 30 Skin Lotion  NutraDerm Skin Lotion  NutraDerm Therapeutic Skin Cream  NutraDerm Therapeutic Skin Lotion  ProShield Protective Hand Cream  Provon moisturizing lotion    Please read over the following fact sheets that you were given.  If you received a COVID test during your pre-op visit  it is requested that you wear a mask when out in public, stay away from anyone that may not be feeling well and notify your surgeon if you develop symptoms. If you have been in contact with anyone that has tested positive in the last 10 days please notify you surgeon.

## 2023-02-19 HISTORY — PX: BACK SURGERY: SHX140

## 2023-02-21 ENCOUNTER — Encounter (HOSPITAL_COMMUNITY)
Admission: RE | Admit: 2023-02-21 | Discharge: 2023-02-21 | Disposition: A | Discharge status: Home / Self Care | Payer: Medicaid Other | Source: Ambulatory Visit | Attending: Neurosurgery | Admitting: Neurosurgery

## 2023-02-21 ENCOUNTER — Other Ambulatory Visit: Payer: Self-pay

## 2023-02-21 ENCOUNTER — Encounter (HOSPITAL_COMMUNITY): Payer: Self-pay

## 2023-02-21 VITALS — BP 128/67 | HR 75 | Temp 98.3°F | Resp 18 | Ht 66.0 in | Wt 160.4 lb

## 2023-02-21 DIAGNOSIS — Z01818 Encounter for other preprocedural examination: Secondary | ICD-10-CM | POA: Diagnosis present

## 2023-02-21 DIAGNOSIS — I10 Essential (primary) hypertension: Secondary | ICD-10-CM | POA: Insufficient documentation

## 2023-02-21 DIAGNOSIS — K219 Gastro-esophageal reflux disease without esophagitis: Secondary | ICD-10-CM | POA: Insufficient documentation

## 2023-02-21 DIAGNOSIS — J449 Chronic obstructive pulmonary disease, unspecified: Secondary | ICD-10-CM | POA: Diagnosis not present

## 2023-02-21 DIAGNOSIS — F17200 Nicotine dependence, unspecified, uncomplicated: Secondary | ICD-10-CM | POA: Diagnosis not present

## 2023-02-21 HISTORY — DX: Other complications of anesthesia, initial encounter: T88.59XA

## 2023-02-21 HISTORY — DX: Other specified postprocedural states: Z98.890

## 2023-02-21 HISTORY — DX: Nausea with vomiting, unspecified: R11.2

## 2023-02-21 HISTORY — DX: Angina pectoris, unspecified: I20.9

## 2023-02-21 HISTORY — DX: Personal history of urinary calculi: Z87.442

## 2023-02-21 LAB — BASIC METABOLIC PANEL
Anion gap: 9 (ref 5–15)
BUN: <5 mg/dL — ABNORMAL LOW (ref 6–20)
CO2: 28 mmol/L (ref 22–32)
Calcium: 9.5 mg/dL (ref 8.9–10.3)
Chloride: 103 mmol/L (ref 98–111)
Creatinine, Ser: 0.47 mg/dL (ref 0.44–1.00)
GFR, Estimated: >60 mL/min (ref >60)
Glucose, Bld: 89 mg/dL (ref 70–99)
Potassium: 3.7 mmol/L (ref 3.5–5.1)
Sodium: 140 mmol/L (ref 135–145)

## 2023-02-21 LAB — BPAM RBC: Unit Type and Rh: 9500

## 2023-02-21 LAB — TYPE AND SCREEN
ABO/RH(D): O NEG
Antibody Screen: POSITIVE
Unit division: 0

## 2023-02-21 LAB — SURGICAL PCR SCREEN
MRSA, PCR: NEGATIVE
Staphylococcus aureus: NEGATIVE

## 2023-02-21 LAB — GLUCOSE, CAPILLARY: Glucose-Capillary: 87 mg/dL (ref 70–99)

## 2023-02-21 NOTE — Progress Notes (Signed)
PCP - Fritzi Mandes Cox,MD Cardiologist - Manrique Alvarez,MD  PPM/ICD - denies Device Orders -  Rep Notified -   Chest x-ray -  EKG - 02/21/23 Stress Test - denies ECHO - denies Cardiac Cath - 2008 per pt  Sleep Study - denies CPAP - no  Fasting Blood Sugar - pt denies prediabetes Checks Blood Sugar _____ times a day  Last dose of GLP1 agonist- 02/13/23  GLP1 instructions: pt stopped Ozempic 02/13/23  Blood Thinner Instructions:na Aspirin Instructions:na  ERAS Protcol -no PRE-SURGERY Ensure or G2-   COVID TEST- na   Anesthesia review: yes-pt complaint of new cough. Pt seen at PAT appointment by Antionette Poles PA  Patient denies shortness of breath, fever, and chest pain at PAT appointment   All instructions explained to the patient, with a verbal understanding of the material. Patient agrees to go over the instructions while at home for a better understanding. Patient also instructed to wear a mask when out in public prior to surgery. The opportunity to ask questions was provided.

## 2023-02-22 ENCOUNTER — Other Ambulatory Visit: Payer: Self-pay | Admitting: Family Medicine

## 2023-02-22 DIAGNOSIS — F17219 Nicotine dependence, cigarettes, with unspecified nicotine-induced disorders: Secondary | ICD-10-CM

## 2023-02-22 LAB — TYPE AND SCREEN
Donor AG Type: NEGATIVE
Unit division: 0

## 2023-02-22 LAB — BPAM RBC

## 2023-02-23 ENCOUNTER — Other Ambulatory Visit: Payer: Self-pay | Admitting: Family Medicine

## 2023-02-25 NOTE — Anesthesia Preprocedure Evaluation (Signed)
Anesthesia Evaluation  Patient identified by MRN, date of birth, ID band Patient awake    Reviewed: Allergy & Precautions, NPO status , Patient's Chart, lab work & pertinent test results  History of Anesthesia Complications (+) PONV and history of anesthetic complications  Airway Mallampati: II  TM Distance: >3 FB Neck ROM: Full    Dental  (+) Dental Advisory Given, Edentulous Upper   Pulmonary asthma , COPD,  COPD inhaler, Current Smoker and Patient abstained from smoking.   Pulmonary exam normal breath sounds clear to auscultation       Cardiovascular hypertension, Pt. on medications + CAD  Normal cardiovascular exam Rhythm:Regular Rate:Normal  Coronary vasospasm    Neuro/Psych  Headaches PSYCHIATRIC DISORDERS Anxiety Depression       GI/Hepatic Neg liver ROS,GERD  Medicated and Controlled,,  Endo/Other  negative endocrine ROS    Renal/GU negative Renal ROS     Musculoskeletal  (+) Arthritis ,    Abdominal   Peds  Hematology negative hematology ROS (+)   Anesthesia Other Findings Day of surgery medications reviewed with the patient.  Reproductive/Obstetrics                             Anesthesia Physical Anesthesia Plan  ASA: 3  Anesthesia Plan: General   Post-op Pain Management: Tylenol PO (pre-op)* and Ketamine IV*   Induction: Intravenous  PONV Risk Score and Plan: 3 and Midazolam, Dexamethasone, Ondansetron and Diphenhydramine  Airway Management Planned: Oral ETT  Additional Equipment:   Intra-op Plan:   Post-operative Plan: Extubation in OR  Informed Consent: I have reviewed the patients History and Physical, chart, labs and discussed the procedure including the risks, benefits and alternatives for the proposed anesthesia with the patient or authorized representative who has indicated his/her understanding and acceptance.     Dental advisory given  Plan  Discussed with: CRNA  Anesthesia Plan Comments: (2nd PIV  PAT note by Antionette Poles, PA-C: 57 year old female with pertinent history including HTN, postoperative nausea and vomiting, GERD,current smoker with associated COPD, coronary vasospasm.  Follows with cardiology at [facility] for history of coronary vasospasm.  This was diagnosed in 2010 when she had a cath at that time showed angiographically normal coronaries.  She was last seen by Dr. Onalee Hua on 07/12/2022.  She was noted to be doing well at that time, symptoms continue to be well-controlled on amlodipine.  She was recommended to continue current dose and follow-up in 1 year.  Patient has been recently worked up by her PCP Dr. Angelene Giovanni for history of "easy bruising".  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  Given the negative [employer] gene study and her history it is acquired.  Most common cause of this disorder are NSAIDS.  Vitamin E and fish oil can cause PLT dysfunction with improvement in bruising.  Will recheck PFA 100.  Another possibilities in this case is amlodipine.  Lumbar spine surgery. Feb 01 2023- Scheduled for June 6th.  Due to acquired aspirin like PLT disorder would consider DDAVP and/or Tranexamic acid perioperatively."  I did call surgeons office to confirm that Dr. Lovell Sheehan is aware of this recommendation.  I evaluated patient her PAT appointment due to report of recent cough.  She states she has a baseline "smoker's cough" that have been slightly more productive recently.  She equated this with seasonal allergies.  She  denied any signs or symptoms of systemic illness.  She did not cough during my conversation with her.  On exam she is well-appearing, NAD, lungs CTAB, heart regular rate and rhythm.  I advised her I will call to follow-up to make sure that there is no evolution of her symptoms.  I did call and speak with the  patient on 02/25/2023 she confirmed that she continues to have an infrequent, mild cough which has improved with use of Singulair.  Given this, I anticipate she can proceed as planned barring any acute status change.  Patient is on once weekly GLP-1 agonist Ozempic, last dose reported 02/13/2023.  BMP 02/21/2023 reviewed, unremarkable.  CBC 02/01/2023 reviewed, WNL.  EKG 02/21/2023: NSR.  Rate 67.  Low-voltage QRS.  Nonspecific T wave abnormality.  Coronary calcium score 11/29/2018 (Care Everywhere): FINDINGS:   CALCIUM SCORE: 0.   LM: 0.  LAD: 0.  Cx: 0.  RCA: 0  PDA: 0   Cardiac anatomy: Normal heart size, no evidence of pericardial effusion  Mediastinum: No adenopathy  Visible abdomen: No significant abnormality  Visible lung fields: Visible portions of the lungs are clear    )        Anesthesia Quick Evaluation

## 2023-02-25 NOTE — Progress Notes (Signed)
Anesthesia Chart Review:  57 year old female with pertinent history including HTN, postoperative nausea and vomiting, GERD,current smoker with associated COPD, coronary vasospasm.  Follows with cardiology at [facility] for history of coronary vasospasm.  This was diagnosed in 2010 when she had a cath at that time showed angiographically normal coronaries.  She was last seen by Dr. Onalee Hua on 07/12/2022.  She was noted to be doing well at that time, symptoms continue to be well-controlled on amlodipine.  She was recommended to continue current dose and follow-up in 1 year.  Patient has been recently worked up by her PCP Dr. Angelene Giovanni for history of "easy bruising".  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  Given the negative [employer] gene study and her history it is acquired.  Most common cause of this disorder are NSAIDS.  Vitamin E and fish oil can cause PLT dysfunction with improvement in bruising.  Will recheck PFA 100.  Another possibilities in this case is amlodipine.  Lumbar spine surgery. Feb 01 2023- Scheduled for June 6th.  Due to acquired aspirin like PLT disorder would consider DDAVP and/or Tranexamic acid perioperatively."  I did call surgeons office to confirm that Dr. Lovell Sheehan is aware of this recommendation.  I evaluated patient her PAT appointment due to report of recent cough.  She states she has a baseline "smoker's cough" that have been slightly more productive recently.  She equated this with seasonal allergies.  She denied any signs or symptoms of systemic illness.  She did not cough during my conversation with her.  On exam she is well-appearing, NAD, lungs CTAB, heart regular rate and rhythm.  I advised her I will call to follow-up to make sure that there is no evolution of her symptoms.  I did call and speak with the patient on 02/25/2023 she confirmed that she continues to have an  infrequent, mild cough which has improved with use of Singulair.  Given this, I anticipate she can proceed as planned barring any acute status change.  Patient is on once weekly GLP-1 agonist Ozempic, last dose reported 02/13/2023.  BMP 02/21/2023 reviewed, unremarkable.  CBC 02/01/2023 reviewed, WNL.  EKG 02/21/2023: NSR.  Rate 67.  Low-voltage QRS.  Nonspecific T wave abnormality.  Coronary calcium score 11/29/2018 (Care Everywhere): FINDINGS:   CALCIUM SCORE: 0.   LM: 0.  LAD: 0.  Cx: 0.  RCA: 0  PDA: 0   Cardiac anatomy:  Normal heart size, no evidence of pericardial effusion  Mediastinum: No adenopathy  Visible abdomen: No significant abnormality  Visible lung fields: Visible portions of the lungs are clear     [facility] Short Stay Center/Anesthesiology Phone [phone] 02/25/2023 12:26 PM

## 2023-02-28 ENCOUNTER — Inpatient Hospital Stay (HOSPITAL_COMMUNITY): Payer: Medicaid Other | Admitting: Anesthesiology

## 2023-02-28 ENCOUNTER — Other Ambulatory Visit: Payer: Self-pay

## 2023-02-28 ENCOUNTER — Inpatient Hospital Stay (HOSPITAL_COMMUNITY): Payer: Medicaid Other

## 2023-02-28 ENCOUNTER — Inpatient Hospital Stay (HOSPITAL_COMMUNITY)
Admission: RE | Disposition: A | Discharge status: Home / Self Care | Payer: Self-pay | Source: Home / Self Care | Attending: Neurosurgery

## 2023-02-28 ENCOUNTER — Inpatient Hospital Stay (HOSPITAL_COMMUNITY): Payer: Medicaid Other | Admitting: Physician Assistant

## 2023-02-28 ENCOUNTER — Inpatient Hospital Stay (HOSPITAL_COMMUNITY)
Admission: RE | Admit: 2023-02-28 | Discharge: 2023-03-02 | DRG: 455 | Disposition: A | Discharge status: Home / Self Care | Payer: Medicaid Other | Attending: Neurosurgery | Admitting: Neurosurgery

## 2023-02-28 DIAGNOSIS — M4150 Other secondary scoliosis, site unspecified: Secondary | ICD-10-CM | POA: Diagnosis not present

## 2023-02-28 DIAGNOSIS — Z881 Allergy status to other antibiotic agents status: Secondary | ICD-10-CM | POA: Diagnosis not present

## 2023-02-28 DIAGNOSIS — Z885 Allergy status to narcotic agent status: Secondary | ICD-10-CM | POA: Diagnosis not present

## 2023-02-28 DIAGNOSIS — Z8249 Family history of ischemic heart disease and other diseases of the circulatory system: Secondary | ICD-10-CM | POA: Diagnosis not present

## 2023-02-28 DIAGNOSIS — M5116 Intervertebral disc disorders with radiculopathy, lumbar region: Principal | ICD-10-CM | POA: Diagnosis present

## 2023-02-28 DIAGNOSIS — J449 Chronic obstructive pulmonary disease, unspecified: Secondary | ICD-10-CM | POA: Diagnosis present

## 2023-02-28 DIAGNOSIS — M545 Low back pain, unspecified: Secondary | ICD-10-CM | POA: Diagnosis present

## 2023-02-28 DIAGNOSIS — E785 Hyperlipidemia, unspecified: Secondary | ICD-10-CM | POA: Diagnosis present

## 2023-02-28 DIAGNOSIS — M48062 Spinal stenosis, lumbar region with neurogenic claudication: Secondary | ICD-10-CM | POA: Diagnosis present

## 2023-02-28 DIAGNOSIS — Z79899 Other long term (current) drug therapy: Secondary | ICD-10-CM

## 2023-02-28 DIAGNOSIS — Z96651 Presence of right artificial knee joint: Secondary | ICD-10-CM | POA: Diagnosis present

## 2023-02-28 DIAGNOSIS — Z803 Family history of malignant neoplasm of breast: Secondary | ICD-10-CM | POA: Diagnosis not present

## 2023-02-28 DIAGNOSIS — Z888 Allergy status to other drugs, medicaments and biological substances status: Secondary | ICD-10-CM | POA: Diagnosis not present

## 2023-02-28 DIAGNOSIS — G8929 Other chronic pain: Secondary | ICD-10-CM | POA: Diagnosis present

## 2023-02-28 DIAGNOSIS — Z800 Family history of malignant neoplasm of digestive organs: Secondary | ICD-10-CM | POA: Diagnosis not present

## 2023-02-28 DIAGNOSIS — I10 Essential (primary) hypertension: Secondary | ICD-10-CM

## 2023-02-28 DIAGNOSIS — Z981 Arthrodesis status: Secondary | ICD-10-CM | POA: Diagnosis not present

## 2023-02-28 DIAGNOSIS — F17210 Nicotine dependence, cigarettes, uncomplicated: Secondary | ICD-10-CM | POA: Diagnosis present

## 2023-02-28 DIAGNOSIS — M4726 Other spondylosis with radiculopathy, lumbar region: Secondary | ICD-10-CM | POA: Diagnosis present

## 2023-02-28 DIAGNOSIS — Z823 Family history of stroke: Secondary | ICD-10-CM | POA: Diagnosis not present

## 2023-02-28 DIAGNOSIS — Z833 Family history of diabetes mellitus: Secondary | ICD-10-CM | POA: Diagnosis not present

## 2023-02-28 DIAGNOSIS — I2510 Atherosclerotic heart disease of native coronary artery without angina pectoris: Secondary | ICD-10-CM | POA: Diagnosis not present

## 2023-02-28 DIAGNOSIS — D691 Qualitative platelet defects: Secondary | ICD-10-CM | POA: Diagnosis not present

## 2023-02-28 DIAGNOSIS — M4156 Other secondary scoliosis, lumbar region: Secondary | ICD-10-CM | POA: Diagnosis present

## 2023-02-28 DIAGNOSIS — K219 Gastro-esophageal reflux disease without esophagitis: Secondary | ICD-10-CM | POA: Diagnosis present

## 2023-02-28 DIAGNOSIS — Z825 Family history of asthma and other chronic lower respiratory diseases: Secondary | ICD-10-CM

## 2023-02-28 DIAGNOSIS — M4186 Other forms of scoliosis, lumbar region: Secondary | ICD-10-CM

## 2023-02-28 DIAGNOSIS — Z90710 Acquired absence of both cervix and uterus: Secondary | ICD-10-CM

## 2023-02-28 DIAGNOSIS — Z7951 Long term (current) use of inhaled steroids: Secondary | ICD-10-CM

## 2023-02-28 DIAGNOSIS — Z820 Family history of epilepsy and other diseases of the nervous system: Secondary | ICD-10-CM | POA: Diagnosis not present

## 2023-02-28 DIAGNOSIS — M7062 Trochanteric bursitis, left hip: Secondary | ICD-10-CM | POA: Diagnosis not present

## 2023-02-28 LAB — TYPE AND SCREEN
ABO/RH(D): O NEG
Antibody Screen: POSITIVE

## 2023-02-28 LAB — BPAM RBC: Blood Product Expiration Date: 202406282359

## 2023-02-28 SURGERY — POSTERIOR LUMBAR FUSION 2 LEVEL
Anesthesia: General | Site: Spine Lumbar

## 2023-02-28 MED ORDER — CHLORHEXIDINE GLUCONATE CLOTH 2 % EX PADS: 6.0000 | MEDICATED_PAD | Freq: Once | CUTANEOUS | Status: DC

## 2023-02-28 MED ORDER — THROMBIN 5000 UNITS EX SOLR
CUTANEOUS | Status: AC
  Filled 2023-02-28: qty 5000

## 2023-02-28 MED ORDER — SURGIRINSE WOUND IRRIGATION SYSTEM - OPTIME
TOPICAL | Status: DC | PRN
  Administered 2023-02-28: 450 mL via TOPICAL

## 2023-02-28 MED ORDER — ACETAMINOPHEN 650 MG RE SUPP: 650.0000 mg | RECTAL | Status: DC | PRN

## 2023-02-28 MED ORDER — PHENOL 1.4 % MT LIQD: 1.0000 | OROMUCOSAL | Status: DC | PRN

## 2023-02-28 MED ORDER — BISACODYL 10 MG RE SUPP: 10.0000 mg | Freq: Every day | RECTAL | Status: DC | PRN

## 2023-02-28 MED ORDER — 0.9 % SODIUM CHLORIDE (POUR BTL) OPTIME
TOPICAL | Status: DC | PRN
  Administered 2023-02-28: 1000 mL

## 2023-02-28 MED ORDER — FAMOTIDINE 20 MG PO TABS
40.0000 mg | ORAL_TABLET | Freq: Every day | ORAL | Status: DC | PRN
  Administered 2023-02-28: 40 mg via ORAL
  Filled 2023-02-28: qty 2

## 2023-02-28 MED ORDER — THROMBIN 5000 UNITS EX SOLR
OROMUCOSAL | Status: DC | PRN
  Administered 2023-02-28: 5 mL

## 2023-02-28 MED ORDER — ALBUTEROL SULFATE (2.5 MG/3ML) 0.083% IN NEBU: 2.5000 mg | INHALATION_SOLUTION | Freq: Four times a day (QID) | RESPIRATORY_TRACT | Status: DC | PRN

## 2023-02-28 MED ORDER — OXYCODONE HCL 5 MG PO TABS
10.0000 mg | ORAL_TABLET | ORAL | Status: DC | PRN
  Administered 2023-03-01: 10 mg via ORAL
  Filled 2023-02-28: qty 2

## 2023-02-28 MED ORDER — DEXAMETHASONE SODIUM PHOSPHATE 10 MG/ML IJ SOLN
INTRAMUSCULAR | Status: DC | PRN
  Administered 2023-02-28: 10 mg via INTRAVENOUS

## 2023-02-28 MED ORDER — PROPOFOL 10 MG/ML IV BOLUS
INTRAVENOUS | Status: AC
  Filled 2023-02-28: qty 20

## 2023-02-28 MED ORDER — ACETAMINOPHEN 500 MG PO TABS
1000.0000 mg | ORAL_TABLET | Freq: Once | ORAL | Status: AC
  Administered 2023-02-28: 1000 mg via ORAL
  Filled 2023-02-28: qty 2

## 2023-02-28 MED ORDER — FENTANYL CITRATE (PF) 100 MCG/2ML IJ SOLN
INTRAMUSCULAR | Status: AC
  Filled 2023-02-28: qty 2

## 2023-02-28 MED ORDER — PROPOFOL 500 MG/50ML IV EMUL
INTRAVENOUS | Status: DC | PRN
  Administered 2023-02-28: 50 ug/kg/min via INTRAVENOUS

## 2023-02-28 MED ORDER — ONDANSETRON HCL 4 MG/2ML IJ SOLN: 4.0000 mg | Freq: Once | INTRAMUSCULAR | Status: AC

## 2023-02-28 MED ORDER — EPHEDRINE 5 MG/ML INJ
INTRAVENOUS | Status: AC
  Filled 2023-02-28: qty 5

## 2023-02-28 MED ORDER — LIDOCAINE 2% (20 MG/ML) 5 ML SYRINGE
INTRAMUSCULAR | Status: DC | PRN
  Administered 2023-02-28: 80 mg via INTRAVENOUS

## 2023-02-28 MED ORDER — ACETAMINOPHEN 325 MG PO TABS
650.0000 mg | ORAL_TABLET | ORAL | Status: DC | PRN
  Filled 2023-02-28: qty 2

## 2023-02-28 MED ORDER — PROPOFOL 10 MG/ML IV BOLUS
INTRAVENOUS | Status: DC | PRN
  Administered 2023-02-28: 120 mg via INTRAVENOUS

## 2023-02-28 MED ORDER — ATROPINE SULFATE 0.4 MG/ML IV SOLN
INTRAVENOUS | Status: AC
  Filled 2023-02-28: qty 1

## 2023-02-28 MED ORDER — ZOLPIDEM TARTRATE 5 MG PO TABS
5.0000 mg | ORAL_TABLET | Freq: Every evening | ORAL | Status: DC | PRN
  Filled 2023-02-28: qty 1

## 2023-02-28 MED ORDER — LACTATED RINGERS IV SOLN: INTRAVENOUS | Status: DC

## 2023-02-28 MED ORDER — BACITRACIN ZINC 500 UNIT/GM EX OINT
TOPICAL_OINTMENT | CUTANEOUS | Status: DC | PRN
  Administered 2023-02-28: 1 via TOPICAL

## 2023-02-28 MED ORDER — LIDOCAINE 2% (20 MG/ML) 5 ML SYRINGE
INTRAMUSCULAR | Status: AC
  Filled 2023-02-28: qty 5

## 2023-02-28 MED ORDER — DOCUSATE SODIUM 100 MG PO CAPS
100.0000 mg | ORAL_CAPSULE | Freq: Two times a day (BID) | ORAL | Status: DC
  Administered 2023-02-28 – 2023-03-02 (×4): 100 mg via ORAL
  Filled 2023-02-28 (×4): qty 1

## 2023-02-28 MED ORDER — CHLORHEXIDINE GLUCONATE 0.12 % MT SOLN
15.0000 mL | Freq: Once | OROMUCOSAL | Status: AC
  Administered 2023-02-28: 15 mL via OROMUCOSAL
  Filled 2023-02-28: qty 15

## 2023-02-28 MED ORDER — FENTANYL CITRATE (PF) 250 MCG/5ML IJ SOLN
INTRAMUSCULAR | Status: AC
  Filled 2023-02-28: qty 5

## 2023-02-28 MED ORDER — ONDANSETRON HCL 4 MG/2ML IJ SOLN
INTRAMUSCULAR | Status: AC
  Filled 2023-02-28: qty 2

## 2023-02-28 MED ORDER — ONDANSETRON HCL 4 MG/2ML IJ SOLN
4.0000 mg | Freq: Four times a day (QID) | INTRAMUSCULAR | Status: DC | PRN
  Administered 2023-02-28: 4 mg via INTRAVENOUS
  Filled 2023-02-28: qty 2

## 2023-02-28 MED ORDER — SODIUM CHLORIDE 0.9% FLUSH
3.0000 mL | Freq: Two times a day (BID) | INTRAVENOUS | Status: DC
  Administered 2023-02-28 – 2023-03-01 (×2): 3 mL via INTRAVENOUS

## 2023-02-28 MED ORDER — BUPROPION HCL ER (XL) 300 MG PO TB24
300.0000 mg | ORAL_TABLET | Freq: Every day | ORAL | Status: DC
  Administered 2023-03-01 – 2023-03-02 (×2): 300 mg via ORAL
  Filled 2023-02-28 (×2): qty 1

## 2023-02-28 MED ORDER — ACETAMINOPHEN 500 MG PO TABS
1000.0000 mg | ORAL_TABLET | Freq: Four times a day (QID) | ORAL | Status: DC
  Administered 2023-03-01: 1000 mg via ORAL
  Filled 2023-02-28: qty 2

## 2023-02-28 MED ORDER — LACTATED RINGERS IV SOLN: INTRAVENOUS | Status: DC | PRN

## 2023-02-28 MED ORDER — ONDANSETRON HCL 4 MG PO TABS: 4.0000 mg | ORAL_TABLET | Freq: Four times a day (QID) | ORAL | Status: DC | PRN

## 2023-02-28 MED ORDER — OXYCODONE HCL 5 MG PO TABS: 5.0000 mg | ORAL_TABLET | ORAL | Status: DC | PRN

## 2023-02-28 MED ORDER — HYDROXYZINE HCL 50 MG/ML IM SOLN
50.0000 mg | Freq: Four times a day (QID) | INTRAMUSCULAR | Status: DC | PRN
  Administered 2023-02-28: 50 mg via INTRAMUSCULAR
  Filled 2023-02-28: qty 1

## 2023-02-28 MED ORDER — BUPIVACAINE-EPINEPHRINE (PF) 0.25% -1:200000 IJ SOLN
INTRAMUSCULAR | Status: AC
  Filled 2023-02-28: qty 30

## 2023-02-28 MED ORDER — BUPIVACAINE LIPOSOME 1.3 % IJ SUSP
INTRAMUSCULAR | Status: DC | PRN
  Administered 2023-02-28: 20 mL

## 2023-02-28 MED ORDER — EPHEDRINE SULFATE-NACL 50-0.9 MG/10ML-% IV SOSY
PREFILLED_SYRINGE | INTRAVENOUS | Status: DC | PRN
  Administered 2023-02-28: 5 mg via INTRAVENOUS

## 2023-02-28 MED ORDER — AMLODIPINE BESYLATE 10 MG PO TABS
10.0000 mg | ORAL_TABLET | Freq: Every day | ORAL | Status: DC
  Filled 2023-02-28: qty 1

## 2023-02-28 MED ORDER — HYDROCODONE-ACETAMINOPHEN 5-325 MG PO TABS
1.0000 | ORAL_TABLET | ORAL | Status: DC | PRN
  Administered 2023-02-28 – 2023-03-01 (×2): 2 via ORAL
  Filled 2023-02-28 (×3): qty 2

## 2023-02-28 MED ORDER — BUPIVACAINE LIPOSOME 1.3 % IJ SUSP
INTRAMUSCULAR | Status: AC
  Filled 2023-02-28: qty 20

## 2023-02-28 MED ORDER — SODIUM CHLORIDE 0.9 % IV SOLN: 250.0000 mL | INTRAVENOUS | Status: DC

## 2023-02-28 MED ORDER — CYCLOBENZAPRINE HCL 10 MG PO TABS
10.0000 mg | ORAL_TABLET | Freq: Three times a day (TID) | ORAL | Status: DC | PRN
  Administered 2023-02-28 – 2023-03-02 (×6): 10 mg via ORAL
  Filled 2023-02-28 (×6): qty 1

## 2023-02-28 MED ORDER — MIDAZOLAM HCL 2 MG/2ML IJ SOLN
INTRAMUSCULAR | Status: DC | PRN
  Administered 2023-02-28: 2 mg via INTRAVENOUS

## 2023-02-28 MED ORDER — HYDROMORPHONE HCL 1 MG/ML IJ SOLN
INTRAMUSCULAR | Status: AC
  Filled 2023-02-28: qty 0.5

## 2023-02-28 MED ORDER — FLUTICASONE PROPIONATE 50 MCG/ACT NA SUSP
1.0000 | Freq: Every day | NASAL | Status: DC
  Filled 2023-02-28: qty 16

## 2023-02-28 MED ORDER — ONDANSETRON HCL 4 MG/2ML IJ SOLN
INTRAMUSCULAR | Status: AC
  Administered 2023-02-28: 4 mg via INTRAVENOUS
  Filled 2023-02-28: qty 2

## 2023-02-28 MED ORDER — MOMETASONE FURO-FORMOTEROL FUM 200-5 MCG/ACT IN AERO
2.0000 | INHALATION_SPRAY | Freq: Two times a day (BID) | RESPIRATORY_TRACT | Status: DC
  Administered 2023-03-01 – 2023-03-02 (×2): 2 via RESPIRATORY_TRACT
  Filled 2023-02-28: qty 8.8

## 2023-02-28 MED ORDER — PROMETHAZINE HCL 25 MG/ML IJ SOLN: 6.2500 mg | INTRAMUSCULAR | Status: DC | PRN

## 2023-02-28 MED ORDER — MIDAZOLAM HCL 2 MG/2ML IJ SOLN
INTRAMUSCULAR | Status: AC
  Filled 2023-02-28: qty 2

## 2023-02-28 MED ORDER — PANTOPRAZOLE SODIUM 40 MG PO TBEC
80.0000 mg | DELAYED_RELEASE_TABLET | Freq: Every day | ORAL | Status: DC
  Administered 2023-03-01 – 2023-03-02 (×2): 80 mg via ORAL
  Filled 2023-02-28 (×2): qty 2

## 2023-02-28 MED ORDER — CEFAZOLIN SODIUM-DEXTROSE 2-4 GM/100ML-% IV SOLN
2.0000 g | INTRAVENOUS | Status: AC
  Administered 2023-02-28: 2 g via INTRAVENOUS
  Filled 2023-02-28: qty 100

## 2023-02-28 MED ORDER — MORPHINE SULFATE (PF) 4 MG/ML IV SOLN
4.0000 mg | INTRAVENOUS | Status: DC | PRN
  Administered 2023-02-28 (×2): 4 mg via INTRAVENOUS
  Filled 2023-02-28 (×2): qty 1

## 2023-02-28 MED ORDER — ROCURONIUM BROMIDE 10 MG/ML (PF) SYRINGE
PREFILLED_SYRINGE | INTRAVENOUS | Status: DC | PRN
  Administered 2023-02-28 (×3): 10 mg via INTRAVENOUS
  Administered 2023-02-28: 60 mg via INTRAVENOUS
  Administered 2023-02-28: 20 mg via INTRAVENOUS

## 2023-02-28 MED ORDER — NITROGLYCERIN 0.4 MG SL SUBL: 0.4000 mg | SUBLINGUAL_TABLET | SUBLINGUAL | Status: DC | PRN

## 2023-02-28 MED ORDER — KETAMINE HCL 50 MG/5ML IJ SOSY
PREFILLED_SYRINGE | INTRAMUSCULAR | Status: AC
  Filled 2023-02-28: qty 5

## 2023-02-28 MED ORDER — PHENYLEPHRINE 80 MCG/ML (10ML) SYRINGE FOR IV PUSH (FOR BLOOD PRESSURE SUPPORT)
PREFILLED_SYRINGE | INTRAVENOUS | Status: DC | PRN
  Administered 2023-02-28: 80 ug via INTRAVENOUS
  Administered 2023-02-28: 160 ug via INTRAVENOUS
  Administered 2023-02-28: 80 ug via INTRAVENOUS

## 2023-02-28 MED ORDER — DEXAMETHASONE SODIUM PHOSPHATE 10 MG/ML IJ SOLN
INTRAMUSCULAR | Status: AC
  Filled 2023-02-28: qty 1

## 2023-02-28 MED ORDER — BACITRACIN ZINC 500 UNIT/GM EX OINT
TOPICAL_OINTMENT | CUTANEOUS | Status: AC
  Filled 2023-02-28: qty 28.35

## 2023-02-28 MED ORDER — ORAL CARE MOUTH RINSE: 15.0000 mL | Freq: Once | OROMUCOSAL | Status: AC

## 2023-02-28 MED ORDER — BUPIVACAINE-EPINEPHRINE (PF) 0.25% -1:200000 IJ SOLN
INTRAMUSCULAR | Status: DC | PRN
  Administered 2023-02-28: 10 mL

## 2023-02-28 MED ORDER — ONDANSETRON HCL 4 MG/2ML IJ SOLN
INTRAMUSCULAR | Status: DC | PRN
  Administered 2023-02-28: 4 mg via INTRAVENOUS

## 2023-02-28 MED ORDER — DICYCLOMINE HCL 10 MG PO CAPS
10.0000 mg | ORAL_CAPSULE | Freq: Three times a day (TID) | ORAL | Status: DC | PRN
  Administered 2023-03-01 – 2023-03-02 (×2): 10 mg via ORAL
  Filled 2023-02-28 (×3): qty 1

## 2023-02-28 MED ORDER — ROCURONIUM BROMIDE 10 MG/ML (PF) SYRINGE
PREFILLED_SYRINGE | INTRAVENOUS | Status: AC
  Filled 2023-02-28: qty 30

## 2023-02-28 MED ORDER — REPATHA SURECLICK 140 MG/ML ~~LOC~~ SOAJ: 140.0000 mg | SUBCUTANEOUS | 2 refills | Status: DC

## 2023-02-28 MED ORDER — SODIUM CHLORIDE 0.9% FLUSH: 3.0000 mL | INTRAVENOUS | Status: DC | PRN

## 2023-02-28 MED ORDER — CEFAZOLIN SODIUM-DEXTROSE 2-4 GM/100ML-% IV SOLN
2.0000 g | Freq: Three times a day (TID) | INTRAVENOUS | Status: AC
  Administered 2023-02-28 – 2023-03-01 (×2): 2 g via INTRAVENOUS
  Filled 2023-02-28 (×2): qty 100

## 2023-02-28 MED ORDER — KETAMINE HCL 10 MG/ML IJ SOLN
INTRAMUSCULAR | Status: DC | PRN
  Administered 2023-02-28 (×3): 10 mg via INTRAVENOUS

## 2023-02-28 MED ORDER — FUROSEMIDE 20 MG PO TABS: 20.0000 mg | ORAL_TABLET | Freq: Every day | ORAL | Status: DC | PRN

## 2023-02-28 MED ORDER — LUBIPROSTONE 8 MCG PO CAPS
8.0000 ug | ORAL_CAPSULE | Freq: Two times a day (BID) | ORAL | Status: DC
  Filled 2023-02-28: qty 1

## 2023-02-28 MED ORDER — FENTANYL CITRATE (PF) 250 MCG/5ML IJ SOLN
INTRAMUSCULAR | Status: DC | PRN
  Administered 2023-02-28 (×8): 50 ug via INTRAVENOUS

## 2023-02-28 MED ORDER — FENTANYL CITRATE (PF) 100 MCG/2ML IJ SOLN
25.0000 ug | INTRAMUSCULAR | Status: DC | PRN
  Administered 2023-02-28 (×2): 50 ug via INTRAVENOUS

## 2023-02-28 MED ORDER — PHENYLEPHRINE 80 MCG/ML (10ML) SYRINGE FOR IV PUSH (FOR BLOOD PRESSURE SUPPORT)
PREFILLED_SYRINGE | INTRAVENOUS | Status: AC
  Filled 2023-02-28: qty 10

## 2023-02-28 MED ORDER — MENTHOL 3 MG MT LOZG: 1.0000 | LOZENGE | OROMUCOSAL | Status: DC | PRN

## 2023-02-28 MED ORDER — SUGAMMADEX SODIUM 200 MG/2ML IV SOLN
INTRAVENOUS | Status: DC | PRN
  Administered 2023-02-28: 200 mg via INTRAVENOUS

## 2023-02-28 MED ORDER — ARTIFICIAL TEARS OPHTHALMIC OINT
TOPICAL_OINTMENT | OPHTHALMIC | Status: AC
  Filled 2023-02-28: qty 7

## 2023-02-28 MED ORDER — PHENYLEPHRINE HCL-NACL 20-0.9 MG/250ML-% IV SOLN
INTRAVENOUS | Status: DC | PRN
  Administered 2023-02-28: 30 ug/min via INTRAVENOUS

## 2023-02-28 SURGICAL SUPPLY — 67 items
APL SKNCLS STERI-STRIP NONHPOA (GAUZE/BANDAGES/DRESSINGS) ×1
BAG COUNTER SPONGE SURGICOUNT (BAG) ×2 IMPLANT
BAG SPNG CNTER NS LX DISP (BAG) ×1
BASKET BONE COLLECTION (BASKET) ×2 IMPLANT
BENZOIN TINCTURE PRP APPL 2/3 (GAUZE/BANDAGES/DRESSINGS) ×2 IMPLANT
BLADE CLIPPER SURG (BLADE) IMPLANT
BUR MATCHSTICK NEURO 3.0 LAGG (BURR) ×2 IMPLANT
BUR PRECISION FLUTE 6.0 (BURR) ×2 IMPLANT
CAGE SABLE 10X26 6-12 8D (Cage) IMPLANT
CANISTER SUCT 3000ML PPV (MISCELLANEOUS) ×2 IMPLANT
CAP LOCK DLX THRD (Cap) IMPLANT
CNTNR URN SCR LID CUP LEK RST (MISCELLANEOUS) ×2 IMPLANT
CONT SPEC 4OZ STRL OR WHT (MISCELLANEOUS) ×1
COVER BACK TABLE 60X90IN (DRAPES) ×2 IMPLANT
DRAPE C-ARM 42X72 X-RAY (DRAPES) ×4 IMPLANT
DRAPE HALF SHEET 40X57 (DRAPES) ×2 IMPLANT
DRAPE LAPAROTOMY 100X72X124 (DRAPES) ×2 IMPLANT
DRAPE SURG 17X23 STRL (DRAPES) ×8 IMPLANT
DRSG OPSITE POSTOP 4X6 (GAUZE/BANDAGES/DRESSINGS) ×2 IMPLANT
ELECT BLADE 4.0 EZ CLEAN MEGAD (MISCELLANEOUS) ×1
ELECT REM PT RETURN 9FT ADLT (ELECTROSURGICAL) ×1
ELECTRODE BLDE 4.0 EZ CLN MEGD (MISCELLANEOUS) ×2 IMPLANT
ELECTRODE REM PT RTRN 9FT ADLT (ELECTROSURGICAL) ×2 IMPLANT
GAUZE 4X4 16PLY ~~LOC~~+RFID DBL (SPONGE) ×2 IMPLANT
GLOVE BIO SURGEON STRL SZ 6 (GLOVE) ×2 IMPLANT
GLOVE BIO SURGEON STRL SZ8 (GLOVE) ×4 IMPLANT
GLOVE BIO SURGEON STRL SZ8.5 (GLOVE) ×4 IMPLANT
GLOVE BIOGEL PI IND STRL 6.5 (GLOVE) ×2 IMPLANT
GLOVE EXAM NITRILE XL STR (GLOVE) IMPLANT
GOWN STRL REUS W/ TWL LRG LVL3 (GOWN DISPOSABLE) ×2 IMPLANT
GOWN STRL REUS W/ TWL XL LVL3 (GOWN DISPOSABLE) ×4 IMPLANT
GOWN STRL REUS W/TWL 2XL LVL3 (GOWN DISPOSABLE) IMPLANT
GOWN STRL REUS W/TWL LRG LVL3 (GOWN DISPOSABLE) ×1
GOWN STRL REUS W/TWL XL LVL3 (GOWN DISPOSABLE) ×2
HEMOSTAT POWDER KIT SURGIFOAM (HEMOSTASIS) ×2 IMPLANT
KIT BASIN OR (CUSTOM PROCEDURE TRAY) ×2 IMPLANT
KIT GRAFTMAG DEL NEURO DISP (NEUROSURGERY SUPPLIES) IMPLANT
KIT POSITION SURG JACKSON T1 (MISCELLANEOUS) ×2 IMPLANT
KIT TURNOVER KIT B (KITS) ×2 IMPLANT
NDL HYPO 21X1.5 SAFETY (NEEDLE) IMPLANT
NEEDLE HYPO 21X1.5 SAFETY (NEEDLE) IMPLANT
NEEDLE HYPO 22GX1.5 SAFETY (NEEDLE) ×2 IMPLANT
NS IRRIG 1000ML POUR BTL (IV SOLUTION) ×2 IMPLANT
PACK LAMINECTOMY NEURO (CUSTOM PROCEDURE TRAY) ×2 IMPLANT
PAD ARMBOARD 7.5X6 YLW CONV (MISCELLANEOUS) ×6 IMPLANT
PATTIES SURGICAL .5 X1 (DISPOSABLE) IMPLANT
PATTIES SURGICAL 1X1 (DISPOSABLE) IMPLANT
PUTTY DBM 5CC CALC GRAN (Putty) IMPLANT
ROD CURVED TI 6.35X70 (Rod) IMPLANT
SCREW PA DLX CREO 7.5X50 (Screw) IMPLANT
SOL ELECTROSURG ANTI STICK (MISCELLANEOUS)
SOLUTION ELECTROSURG ANTI STCK (MISCELLANEOUS) ×2 IMPLANT
SOLUTION IRRIG SURGIPHOR (IV SOLUTION) ×2 IMPLANT
SPACER ALTERA 10X31 8-12MM-8 (Spacer) IMPLANT
SPIKE FLUID TRANSFER (MISCELLANEOUS) ×2 IMPLANT
SPONGE NEURO XRAY DETECT 1X3 (DISPOSABLE) IMPLANT
SPONGE SURGIFOAM ABS GEL 100 (HEMOSTASIS) IMPLANT
SPONGE T-LAP 4X18 ~~LOC~~+RFID (SPONGE) IMPLANT
STRIP CLOSURE SKIN 1/2X4 (GAUZE/BANDAGES/DRESSINGS) ×2 IMPLANT
SUT VIC AB 1 CT1 18XBRD ANBCTR (SUTURE) ×4 IMPLANT
SUT VIC AB 1 CT1 8-18 (SUTURE) ×2
SUT VIC AB 2-0 CP2 18 (SUTURE) ×4 IMPLANT
SYR 20ML LL LF (SYRINGE) IMPLANT
TOWEL GREEN STERILE (TOWEL DISPOSABLE) ×2 IMPLANT
TOWEL GREEN STERILE FF (TOWEL DISPOSABLE) ×2 IMPLANT
TRAY FOLEY MTR SLVR 16FR STAT (SET/KITS/TRAYS/PACK) ×2 IMPLANT
WATER STERILE IRR 1000ML POUR (IV SOLUTION) ×2 IMPLANT

## 2023-02-28 NOTE — Transfer of Care (Signed)
Immediate Anesthesia Transfer of Care Note  Patient: [name]  Procedure(s) Performed: Posterior Lumbar Interbody Fusion,Interbody Prosthesis, Posterior Instrumentation, Lumbar Three- Lumbar Four,  Lumbar Four- Lumbar Five (Spine Lumbar)  Patient Location: PACU  Anesthesia Type:General  Level of Consciousness: drowsy  Airway & Oxygen Therapy: Patient Spontanous Breathing and Patient connected to face mask oxygen  Post-op Assessment: Report given to RN, Post -op Vital signs reviewed and stable, and Patient moving all extremities X 4  Post vital signs: Reviewed and stable  Last Vitals:  Vitals Value Taken Time  BP 102/53 02/28/23 1901  Temp 36.4 C 02/28/23 1900  Pulse 97 02/28/23 1906  Resp 12 02/28/23 1906  SpO2 97 % 02/28/23 1906  Vitals shown include unvalidated device data.  Last Pain:  Vitals:   02/28/23 1900  TempSrc:   PainSc: Asleep      Patients Stated Pain Goal: 0 (02/28/23 1012)  Complications: No notable events documented.

## 2023-02-28 NOTE — H&P (Signed)
Subjective: The patient is a 57 year old white female who is complaining of chronic back pain with pain numbness and tingling in her left greater right leg.  She has failed medical management.  He was worked up with a lumbar MRI and lumbar x-rays which demonstrated the patient had a degenerative scoliosis, degenerative disc disease and stenosis.  I discussed the various treatment options with her.  She has decided proceed with surgery.  Past Medical History:  Diagnosis Date   Allergic rhinitis    Anginal pain (HCC)    Arthritis    Blood transfusion without reported diagnosis    Cellulitis, face 06/08/2022   Complication of anesthesia    COPD (chronic obstructive pulmonary disease) (HCC)    Dental abscess 06/09/2022   GERD (gastroesophageal reflux disease)    History of colon polyps    History of kidney stones    HTN (hypertension)    Hyperlipidemia    Kidney stones    Pancreatitis    Pneumonia    PONV (postoperative nausea and vomiting)    Primary hypertension 10/20/2021   Psychotic depression (HCC)    Shingles February 26th, 2001    Past Surgical History:  Procedure Laterality Date   ABDOMINAL HYSTERECTOMY     CARDIAC CATHETERIZATION  2008   CESAREAN SECTION     CHOLECYSTECTOMY     COLONOSCOPY  05/02/2017   Colonic polyp status post polypectomy. Small internal hemorrhoids   FRACTURE SURGERY Bilateral    femur   GALLBLADDER SURGERY     JOINT REPLACEMENT     REPLACEMENT TOTAL KNEE Right 02/09/2019   TOTAL KNEE ARTHROPLASTY  02/09/2019   TUBAL LIGATION      Allergies  Allergen Reactions   Levofloxacin Other (See Comments) and Rash    Rash all over redness   Bactrim Ds [Sulfamethoxazole-Trimethoprim] Nausea Only   Lipitor [Atorvastatin] Other (See Comments)    Myalgia    Tape Hives   Zetia [Ezetimibe]     Muscle pain   Codeine Nausea Only   Other Rash    bandaids leave a red area if left on too long    Social History   Tobacco Use   Smoking status: Every Day     Packs/day: 1.00    Years: 20.00    Additional pack years: 0.00    Total pack years: 20.00    Types: Cigarettes   Smokeless tobacco: Never  Substance Use Topics   Alcohol use: Yes    Comment: occas    Family History  Problem Relation Age of Onset   COPD Mother    Parkinson's disease Mother    COPD Father    Arthritis Father    Lung disease Father    Colon cancer Cousin        mother's cousin   Breast cancer Maternal Aunt        great aunt   Diabetes Other    Stroke Other    Hypertension Other    Hyperlipidemia Other    Asthma Other    Heart failure Other    Thyroid disease Other    Heart attack Other    COPD Other    Arrhythmia Other    Arthritis Other    Migraines Other    Prior to Admission medications   Medication Sig Start Date End Date Taking? Authorizing Provider  acetaminophen (TYLENOL) 650 MG CR tablet Take 650-1,300 mg by mouth every 8 (eight) hours as needed for pain.   Yes [provider]  albuterol (VENTOLIN HFA) 108 (90 Base) MCG/ACT inhaler INHALE 2 PUFFS INTO LUNGS EVERY 6 TO 8 HOURS AS NEEDED FOR WHEEZING 10/11/22  Yes Cox, Kirsten, MD  ALPRAZolam (XANAX) 0.25 MG tablet TAKE 1 TABLET(0.25 MG) BY MOUTH DAILY AS NEEDED FOR ANXIETY 12/17/22  Yes Cox, Kirsten, MD  amLODipine (NORVASC) 10 MG tablet TAKE 1 TABLET BY MOUTH DAILY 10/04/22  Yes Cox, Kirsten, MD  buPROPion (WELLBUTRIN XL) 300 MG 24 hr tablet TAKE 1 TABLET(300 MG) BY MOUTH DAILY 01/24/23  Yes Cox, Kirsten, MD  cyclobenzaprine (FLEXERIL) 10 MG tablet Take 1 tablet (10 mg total) by mouth 3 (three) times daily as needed for muscle spasms. 12/17/22  Yes Cox, Kirsten, MD  dicyclomine (BENTYL) 10 MG capsule TAKE 1 CAPSULE(10 MG) BY MOUTH FOUR TIMES DAILY BEFORE MEALS AND AT BEDTIME Patient taking differently: Take 10 mg by mouth 3 (three) times daily as needed for spasms. 08/18/22  Yes Cox, Kirsten, MD  Docusate Sodium (DSS) 100 MG CAPS Take 100 mg by mouth daily as needed (constipation).   Yes  [provider]  Evolocumab (REPATHA SURECLICK) 140 MG/ML SOAJ Inject 140 mg as directed every 14 (fourteen) days. 02/28/23  Yes Cox, Kirsten, MD  famotidine (PEPCID) 40 MG tablet TAKE 1 TABLET(40 MG) BY MOUTH DAILY Patient taking differently: Take 40 mg by mouth daily as needed for heartburn or indigestion. 06/20/22  Yes Cox, Kirsten, MD  fluticasone [facility]) 50 MCG/ACT nasal spray SHAKE LIQUID AND USE 2 SPRAYS IN EACH NOSTRIL DAILY 02/03/23  Yes Cox, Kirsten, MD  fluticasone-salmeterol (ADVAIR HFA) 115-21 MCG/ACT inhaler Inhale 2 puffs into the lungs 2 (two) times daily. Patient taking differently: Inhale 2 puffs into the lungs 2 (two) times daily as needed (asthma). 09/09/22  Yes Cox, Kirsten, MD  furosemide (LASIX) 20 MG tablet Take 1 tablet (20 mg total) by mouth daily as needed for edema. 03/25/22  Yes Cox, Kirsten, MD  HYDROcodone-acetaminophen (NORCO/VICODIN) 5-325 MG tablet Take 1-2 tablets by mouth every 6 (six) hours.   Yes [provider]  lubiprostone (AMITIZA) 8 MCG capsule Take 1 capsule (8 mcg total) by mouth 2 (two) times daily with a meal. 02/15/23  Yes Cox, Kirsten, MD  montelukast (SINGULAIR) 10 MG tablet TAKE 1 TABLET(10 MG) BY MOUTH AT BEDTIME Patient taking differently: Take 10 mg by mouth every Monday, Wednesday, and Friday. 10/01/22  Yes Janie Morning, NP  nicotine (NICODERM CQ - DOSED IN MG/24 HOURS) 21 mg/24hr patch APPLY 1 PATCH(21 MG) TOPICALLY TO THE SKIN DAILY 02/22/23  Yes Cox, Kirsten, MD  omeprazole (PRILOSEC) 40 MG capsule Take 1 capsule (40 mg total) by mouth daily. 09/09/22  Yes Cox, Kirsten, MD  OZEMPIC, 2 MG/DOSE, 8 MG/3ML SOPN INJECT 2 MG AS DIRECTED ONCE A WEEK 04/12/22  Yes Cox, Kirsten, MD  triamcinolone (KENALOG) 0.025 % ointment Apply 1 Application topically 2 (two) times daily. Apply to corners of mouth twice daily 12/06/22  Yes Cox, Kirsten, MD  nitroGLYCERIN (NITROSTAT) 0.4 MG SL tablet DISSOLVE 1 TABLET BY MOUTH UNDER THE TONGUE AS NEEDED  FOR CHEST PAIN EVERY 5 MINUTES UP TO 3 TIMES 12/23/20   Marianne Sofia, PA-C     Review of Systems  Positive ROS: As above  All other systems have been reviewed and were otherwise negative with the exception of those mentioned in the HPI and as above.  Objective: Vital signs in last 24 hours: Temp:  [98 F (36.7 C)] 98 F (36.7 C) (06/10 0949) Pulse Rate:  [78] 78 (  06/10 0949) Resp:  [18] 18 (06/10 0949) BP: (122)/(56) 122/56 (06/10 0949) SpO2:  [97 %] 97 % (06/10 0949) Weight:  [72.6 kg] 72.6 kg (06/10 0949) Estimated body mass index is 25.82 kg/m as calculated from the following:   Height as of this encounter: 5\' 6"  (1.676 m).   Weight as of this encounter: 72.6 kg.   General Appearance: Alert Head: Normocephalic, without obvious abnormality, atraumatic Eyes: PERRL, conjunctiva/corneas clear, EOM's intact,    Ears: Normal  Throat: Normal  Neck: Supple, Back: unremarkable Lungs: Clear to auscultation bilaterally, respirations unlabored Heart: Regular rate and rhythm, no murmur, rub or gallop Abdomen: Soft, non-tender Extremities: Extremities normal, atraumatic, no cyanosis or edema Skin: unremarkable  NEUROLOGIC:   Mental status: alert and oriented,Motor Exam - grossly normal Sensory Exam - grossly normal Reflexes:  Coordination - grossly normal Gait - grossly normal Balance - grossly normal Cranial Nerves: I: smell Not tested  II: visual acuity  OS: Normal  OD: Normal   II: visual fields Full to confrontation  II: pupils Equal, round, reactive to light  III,VII: ptosis None  III,IV,VI: extraocular muscles  Full ROM  V: mastication Normal  V: facial light touch sensation  Normal  V,VII: corneal reflex  Present  VII: facial muscle function - upper  Normal  VII: facial muscle function - lower Normal  VIII: hearing Not tested  IX: soft palate elevation  Normal  IX,X: gag reflex Present  XI: trapezius strength  5/5  XI: sternocleidomastoid strength 5/5  XI: neck  flexion strength  5/5  XII: tongue strength  Normal    Data Review Lab Results  Component Value Date   WBC 7.3 02/01/2023   HGB 15.4 02/01/2023   HCT 44 02/01/2023   MCV 97.6 01/04/2023   PLT 297 02/01/2023   Lab Results  Component Value Date   NA 140 02/21/2023   K 3.7 02/21/2023   CL 103 02/21/2023   CO2 28 02/21/2023   BUN <5 (L) 02/21/2023   CREATININE 0.47 02/21/2023   GLUCOSE 89 02/21/2023   Lab Results  Component Value Date   INR 0.8 01/04/2023    Assessment/Plan: Adult degenerative scoliosis, lumbar degenerative disease, lumbar spondylosis, lumbar radiculopathy: I have discussed the situation with the patient.  I reviewed her imaging studies with her and pointed out the abnormalities.  We have discussed the various treatment options including surgery.  I have described the surgical treatment option of an L3-4 and L4-5 decompression, instrumentation and fusion.  I have shown her surgical models.  I have given her surgical pamphlet.  We have discussed the risk, benefits, alternatives, expected postop course, and likelihood of achieving our goals with surgery.  I have answered all her questions.  She has decided proceed with surgery.   [name] 02/28/2023 1:06 PM

## 2023-02-28 NOTE — Anesthesia Procedure Notes (Signed)
Procedure Name: Intubation Date/Time: 02/28/2023 2:07 PM  Performed by: Marena Chancy, CRNAPre-anesthesia Checklist: Patient identified, Emergency Drugs available, Suction available and Patient being monitored Patient Re-evaluated:Patient Re-evaluated prior to induction Oxygen Delivery Method: Circle System Utilized Preoxygenation: Pre-oxygenation with 100% oxygen Induction Type: IV induction Ventilation: Mask ventilation without difficulty Laryngoscope Size: Mac and 3 Grade View: Grade I Tube type: Oral Tube size: 7.0 mm Number of attempts: 1 Airway Equipment and Method: Stylet Placement Confirmation: ETT inserted through vocal cords under direct vision, positive ETCO2 and breath sounds checked- equal and bilateral Secured at: 22 cm Tube secured with: Tape Dental Injury: Teeth and Oropharynx as per pre-operative assessment

## 2023-02-28 NOTE — Anesthesia Postprocedure Evaluation (Signed)
Anesthesia Post Note  Patient: [name]  Procedure(s) Performed: Posterior Lumbar Interbody Fusion,Interbody Prosthesis, Posterior Instrumentation, Lumbar Three- Lumbar Four,  Lumbar Four- Lumbar Five (Spine Lumbar)     Patient location during evaluation: PACU Anesthesia Type: General Level of consciousness: awake and alert Pain management: pain level controlled Vital Signs Assessment: post-procedure vital signs reviewed and stable Respiratory status: spontaneous breathing, nonlabored ventilation, respiratory function stable and patient connected to nasal cannula oxygen Cardiovascular status: blood pressure returned to baseline and stable Postop Assessment: no apparent nausea or vomiting Anesthetic complications: no   No notable events documented.  Last Vitals:  Vitals:   02/28/23 1945 02/28/23 2010  BP: 110/63 (!) 111/54  Pulse: 97 86  Resp: 19 20  Temp: 36.6 C 36.4 C  SpO2: 96% 98%    Last Pain:  Vitals:   02/28/23 2010  TempSrc: Oral  PainSc: 8                  Andreika Vandagriff S

## 2023-02-28 NOTE — Op Note (Signed)
Brief history: The patient is a 57 year old white female who has complained of back and left leg pain consistent with lumbar radiculopathy/neurogenic claudication.  She has failed medical management and was worked up with a lumbar MRI and lumbar x-rays which demonstrated degenerative scoliosis, stenosis, etc.  I discussed the various treatment options with her.  She has decided proceed with surgery.  Preoperative diagnosis: Adult degenerative scoliosis, lumbar degenerative disc disease, lumbar spinal stenosis compressing both L3, L4 and L5 nerve roots; lumbago; lumbar radiculopathy; neurogenic claudication  Postoperative diagnosis: The same  Procedure: Bilateral L3-4 and L4-5 laminotomy/foraminotomies/medial facetectomy to decompress the bilateral L3, L4 and L5 nerve roots(the work required to do this was in addition to the work required to do the posterior lumbar interbody fusion because of the patient's spinal stenosis, facet arthropathy. Etc. requiring a wide decompression of the nerve roots.);  Left L3-4 and L4-5 transforaminal lumbar interbody fusion with local morselized autograft bone and Zimmer DBM; insertion of interbody prosthesis at L3-4 and L4-5 (globus peek expandable interbody prosthesis); posterior segmental instrumentation from L3 to L5 with globus titanium pedicle screws and rods; posterior lateral arthrodesis at L3-4 and L4-5 with local morselized autograft bone and Zimmer DBM.  Surgeon: Dr. Delma Officer  Asst.: Hildred Priest, NP  Anesthesia: Gen. endotracheal  Estimated blood loss: 300 cc  Drains: None  Complications: None  Description of procedure: The patient was brought to the operating room by the anesthesia team. General endotracheal anesthesia was induced. The patient was turned to the prone position on the Wilson frame. The patient's lumbosacral region was then prepared with Betadine scrub and Betadine solution. Sterile drapes were applied.  I then injected the area to  be incised with Marcaine with epinephrine solution. I then used the scalpel to make a linear midline incision over the L3-4 and L4-5 interspace. I then used electrocautery to perform a bilateral subperiosteal dissection exposing the spinous process and lamina of L3-4 and L4-5. We then obtained intraoperative radiograph to confirm our location. We then inserted the Verstrac retractor to provide exposure.  I began the decompression by using the high speed drill to perform laminotomies at L3-4 and L4-5 bilaterally. We then used the Kerrison punches to widen the laminotomy and removed the ligamentum flavum at L3-4 and L4-5 bilaterally. We used the Kerrison punches to remove the medial facets at L3-4 and L4-5 bilaterally, we removed the left L3-4 and L4-5 facet. We performed wide foraminotomies about the bilateral L3, L4 and L5 nerve roots completing the decompression.  We now turned our attention to the posterior lumbar interbody fusion. I used a scalpel to incise the intervertebral disc at L3-4 and L4-5 bilaterally. I then performed a partial intervertebral discectomy at L3-4 and L4-5 bilaterally using the pituitary forceps. We prepared the vertebral endplates at L3-4 and L4-5 bilaterally for the fusion by removing the soft tissues with the curettes. We then used the trial spacers to pick the appropriate sized interbody prosthesis. We prefilled his prosthesis with a combination of local morselized autograft bone that we obtained during the decompression as well as Zimmer DBM. We inserted the prefilled prosthesis into the interspace at L3-4 and L4-5 from the left, we then turned and expanded the prosthesis. There was a good snug fit of the prosthesis in the interspace. We then filled and the remainder of the intervertebral disc space with local morselized autograft bone and Zimmer DBM. This completed the posterior lumbar interbody arthrodesis.  During the decompression and insertion of the prosthesis the assistant  protected the thecal sac and nerve roots with the D'Errico retractor.  We now turned attention to the instrumentation. Under fluoroscopic guidance we cannulated the bilateral L3, L4 and L5 pedicles with the bone probe. We then removed the bone probe. We then tapped the pedicle with a 6.5 millimeter tap. We then removed the tap. We probed inside the tapped pedicle with a ball probe to rule out cortical breaches. We then inserted a 7.5 x 50 millimeter pedicle screw into the L3, L4 and L5 pedicles bilaterally under fluoroscopic guidance. We then palpated along the medial aspect of the pedicles to rule out cortical breaches. There were none. The nerve roots were not injured. We then connected the unilateral pedicle screws with a lordotic rod. We compressed and distracted the construct and secured the rod in place with the caps. We then tightened the caps appropriately. This completed the instrumentation from L3-L5 bilaterally.  We now turned our attention to the posterior lateral arthrodesis at L3-4 and L4-5. We used the high-speed drill to decorticate the remainder of the facets, pars, transverse process at L3-4 and L4-5. We then applied a combination of local morselized autograft bone and Zimmer DBM over these decorticated posterior lateral structures. This completed the posterior lateral arthrodesis.  We then obtained hemostasis using bipolar electrocautery. We irrigated the wound out with Betadine solution. We inspected the thecal sac and nerve roots and noted they were well decompressed. We then removed the retractor.  We injected Exparel . We reapproximated patient's thoracolumbar fascia with interrupted #1 Vicryl suture. We reapproximated patient's subcutaneous tissue with interrupted 2-0 Vicryl suture. The reapproximated patient's skin with Steri-Strips and benzoin. The wound was then coated with bacitracin ointment. A sterile dressing was applied. The drapes were removed. The patient was subsequently  returned to the supine position where they were extubated by the anesthesia team. He was then transported to the post anesthesia care unit in stable condition. All sponge instrument and needle counts were reportedly correct at the end of this case.

## 2023-03-01 MED ORDER — HYDROMORPHONE HCL 1 MG/ML IJ SOLN
0.5000 mg | INTRAMUSCULAR | Status: DC | PRN
  Administered 2023-03-01: 1 mg via INTRAVENOUS
  Administered 2023-03-01: 0.5 mg via INTRAVENOUS
  Filled 2023-03-01 (×2): qty 1

## 2023-03-01 MED ORDER — OXYCODONE-ACETAMINOPHEN 5-325 MG PO TABS
1.0000 | ORAL_TABLET | ORAL | Status: DC | PRN
  Administered 2023-03-01 – 2023-03-02 (×8): 2 via ORAL
  Filled 2023-03-01 (×8): qty 2

## 2023-03-01 MED ORDER — LUBIPROSTONE 8 MCG PO CAPS
8.0000 ug | ORAL_CAPSULE | Freq: Two times a day (BID) | ORAL | Status: DC
  Administered 2023-03-01 – 2023-03-02 (×3): 8 ug via ORAL
  Filled 2023-03-01 (×4): qty 1

## 2023-03-01 MED FILL — Thrombin For Soln 5000 Unit: CUTANEOUS | Qty: 5000 | Status: AC

## 2023-03-01 NOTE — Progress Notes (Signed)
Orthopedic Tech Progress Note Patient Details:  [name] [birth year] [id number]  Ortho Devices Type of Ortho Device: Lumbar corsett Ortho Device/Splint Interventions: Ordered, Application, Adjustment   Post Interventions Patient Tolerated: Well Instructions Provided: Care of device, Adjustment of device  Trinna Post 03/01/2023, 6:39 AM

## 2023-03-01 NOTE — Evaluation (Addendum)
Physical Therapy Evaluation Patient Details Name: [name] MRN: [id number] DOB: 05-Mar-1966 Today's Date: 03/01/2023  History of Present Illness  Pt is a 57 y.o. female who presented 02/28/23 for elective left L3-5 TLIF and bil L3-5 laminotomy/foraminotomies/medial facetectomy. PMH: COPD, GERD, HTN, HLD, psychotic depression   Clinical Impression  Pt presents with condition above and deficits mentioned below, see PT Problem List. PTA, she was independent, living alone in a 1-level house with 1 STE. She plans to go stay with her mother in a similarly set-up house upon d/c. Currently, pt is limited primarily by her pain and feeling "woozy". She reports she had numbness and weakness in her L leg prior to surgery but this is no longer present post-surgery, demonstrating WFL and symmetrical bil lower extremity strength and sensation. She was able to perform all functional mobility at a min guard assist level while utilizing a RW for support today. Pt will likely progress well as her pain improves, and therefore she will likely not need PT follow-up upon d/c home. Will continue to follow acutely.       Recommendations for follow up therapy are one component of a multi-disciplinary discharge planning process, led by the attending physician.  Recommendations may be updated based on patient status, additional functional criteria and insurance authorization.  Follow Up Recommendations       Assistance Recommended at Discharge Intermittent Supervision/Assistance  Patient can return home with the following  A little help with bathing/dressing/bathroom;Assistance with cooking/housework;Assist for transportation;Help with stairs or ramp for entrance    Equipment Recommendations Rolling walker (2 wheels)  Recommendations for Other Services  OT consult    Functional Status Assessment Patient has had a recent decline in their functional status and demonstrates the ability to make significant  improvements in function in a reasonable and predictable amount of time.     Precautions / Restrictions Precautions Precautions: Back Precaution Booklet Issued: Yes (comment) Precaution Comments: watch BP; reviewed precautions Required Braces or Orthoses: Spinal Brace Spinal Brace: Lumbar corset;Applied in sitting position Restrictions Weight Bearing Restrictions: No      Mobility  Bed Mobility Overal bed mobility: Needs Assistance Bed Mobility: Rolling, Sidelying to Sit Rolling: Min guard Sidelying to sit: Min guard       General bed mobility comments: HOB elevated and no use of rails, cuing pt to maintain spinal precautions with all bed mobility, needing extra time due to pain, min guard for safety    Transfers Overall transfer level: Needs assistance Equipment used: Rolling walker (2 wheels) Transfers: Sit to/from Stand Sit to Stand: Min guard           General transfer comment: Cues needed for proper hand placement with sit <> stand transfers, 2x from EOB, no LOB, min guard for safety    Ambulation/Gait Ambulation/Gait assistance: Min guard Gait Distance (Feet): 110 Feet Assistive device: Rolling walker (2 wheels) Gait Pattern/deviations: Step-through pattern, Decreased stride length Gait velocity: reduced Gait velocity interpretation: <1.31 ft/sec, indicative of household ambulator   General Gait Details: Pt with slow, cautious gait pattern but no other drastic gait deviations and no overt LOB, min guard for safety  Stairs            Wheelchair Mobility    Modified Rankin (Stroke Patients Only)       Balance Overall balance assessment: Needs assistance Sitting-balance support: No upper extremity supported, Feet supported Sitting balance-Leahy Scale: Good     Standing balance support: Bilateral upper extremity supported, During functional activity,  Reliant on assistive device for balance Standing balance-Leahy Scale: Poor Standing balance  comment: Reliant on RW to ambulate                             Pertinent Vitals/Pain Pain Assessment Pain Assessment: Faces Faces Pain Scale: Hurts even more Pain Location: back Pain Descriptors / Indicators: Discomfort, Grimacing, Operative site guarding Pain Intervention(s): Limited activity within patient's tolerance, Monitored during session, Premedicated before session, Repositioned    Home Living Family/patient expects to be discharged to:: Private residence Living Arrangements: Alone Available Help at Discharge: Family;Available 24 hours/day Type of Home: House Home Access: Stairs to enter Entrance Stairs-Rails: None Entrance Stairs-Number of Steps: 1 (short)   Home Layout: One level Home Equipment: Shower seat Additional Comments: Pt plans to go stay with her mother upon d/c, who can be with her 24/7 and who has a similar home set-up to above but mother also has grab bars in the shower; boyfriend also able to assist PRN if needed    Prior Function Prior Level of Function : Independent/Modified Independent             Mobility Comments: No AD       Hand Dominance        Extremity/Trunk Assessment   Upper Extremity Assessment Upper Extremity Assessment: Defer to OT evaluation    Lower Extremity Assessment Lower Extremity Assessment: Overall WFL for tasks assessed (MMT scores of 4+ to 5 grossly bil; sensation intact bil; L lower leg numbness and weakness that was present prior to surgery is no longer present)    Cervical / Trunk Assessment Cervical / Trunk Assessment: Back Surgery  Communication   Communication: No difficulties  Cognition Arousal/Alertness: Awake/alert Behavior During Therapy: WFL for tasks assessed/performed Overall Cognitive Status: Within Functional Limits for tasks assessed                                          General Comments General comments (skin integrity, edema, etc.): BP 105/60 sitting in  recliner end of session with pt reporting "woozy" sensations fluctuating throughout session    Exercises     Assessment/Plan    PT Assessment Patient needs continued PT services  PT Problem List Decreased activity tolerance;Decreased balance;Decreased mobility;Decreased knowledge of precautions;Pain       PT Treatment Interventions DME instruction;Gait training;Functional mobility training;Stair training;Therapeutic activities;Therapeutic exercise;Balance training;Neuromuscular re-education;Patient/family education    PT Goals (Current goals can be found in the Care Plan section)  Acute Rehab PT Goals Patient Stated Goal: to reduce pain and improve PT Goal Formulation: With patient/family Time For Goal Achievement: 03/08/23 Potential to Achieve Goals: Good    Frequency Min 5X/week     Co-evaluation               AM-PAC PT "6 Clicks" Mobility  Outcome Measure Help needed turning from your back to your side while in a flat bed without using bedrails?: A Little Help needed moving from lying on your back to sitting on the side of a flat bed without using bedrails?: A Little Help needed moving to and from a bed to a chair (including a wheelchair)?: A Little Help needed standing up from a chair using your arms (e.g., wheelchair or bedside chair)?: A Little Help needed to walk in hospital room?: A Little Help needed climbing 3-5 steps  with a railing? : A Little 6 Click Score: 18    End of Session Equipment Utilized During Treatment: Gait belt;Back brace Activity Tolerance: Patient tolerated treatment well Patient left: in chair;with call bell/phone within reach Nurse Communication: Mobility status;Other (comment) (BP) PT Visit Diagnosis: Unsteadiness on feet (R26.81);Other abnormalities of gait and mobility (R26.89);Difficulty in walking, not elsewhere classified (R26.2);Pain Pain - part of body:  (back)    Time: 4098-1191 PT Time Calculation (min) (ACUTE ONLY): 28  min   Charges:   PT Evaluation $PT Eval Moderate Complexity: 1 Mod PT Treatments $Therapeutic Activity: 8-22 mins        Raymond Gurney, PT, DPT Acute Rehabilitation Services  Office: [phone]   Jewel Baize 03/01/2023, 8:57 AM

## 2023-03-01 NOTE — Evaluation (Signed)
Occupational Therapy Evaluation Patient Details Name: [name] MRN: [id number] DOB: 1965/11/08 Today's Date: 03/01/2023   History of Present Illness 57 y.o. female who presented 02/28/23 for elective left L3-5 TLIF and bil L3-5 laminotomy/foraminotomies/medial facetectomy. PMH: COPD, GERD, HTN, HLD, psychotic depression   Clinical Impression   Patient is s/p TLIF L3-5 surgery resulting in functional limitations due to the deficits listed below (see OT problem list). Pt at baseline indep and driving. Pt currently requires min guard for transfers with RW and increased time. Pt able to don brace and complete LB with figure 4 cross.  Patient will benefit from skilled OT acutely to increase independence and safety with ADLS to allow discharge home.       Recommendations for follow up therapy are one component of a multi-disciplinary discharge planning process, led by the attending physician.  Recommendations may be updated based on patient status, additional functional criteria and insurance authorization.   Assistance Recommended at Discharge None  Patient can return home with the following Assist for transportation    Functional Status Assessment  Patient has had a recent decline in their functional status and demonstrates the ability to make significant improvements in function in a reasonable and predictable amount of time.  Equipment Recommendations  None recommended by OT    Recommendations for Other Services       Precautions / Restrictions Precautions Precautions: Back Precaution Booklet Issued: Yes (comment) Precaution Comments: provided handout and reviewed back precautions Required Braces or Orthoses: Spinal Brace Spinal Brace: Lumbar corset;Applied in sitting position Restrictions Weight Bearing Restrictions: No      Mobility Bed Mobility Overal bed mobility: Needs Assistance Bed Mobility: Rolling, Supine to Sit, Sit to Supine Rolling: Min guard Sidelying to  sit: Min guard Supine to sit: Min guard     General bed mobility comments: pt elevating the [facility] and OT educating on making surface more like home each time. pt at [facility] [facility] and grab bars removed. pt reports she will use a standard bed at home. pt needs increased time to complete sit <>supine    Transfers Overall transfer level: Needs assistance Equipment used: Rolling walker (2 wheels) Transfers: Sit to/from Stand Sit to Stand: Min guard           General transfer comment: cues to scoot to eob to power up easier      Balance Overall balance assessment: Needs assistance Sitting-balance support: No upper extremity supported, Feet supported Sitting balance-Leahy Scale: Good     Standing balance support: Bilateral upper extremity supported, During functional activity, Reliant on assistive device for balance Standing balance-Leahy Scale: Fair Standing balance comment: Reliant on RW to ambulate                           ADL either performed or assessed with clinical judgement   ADL Overall ADL's : Needs assistance/impaired Eating/Feeding: Independent   Grooming: Oral care;Supervision/safety   Upper Body Bathing: Supervision/ safety       Upper Body Dressing : Supervision/safety Upper Body Dressing Details (indicate cue type and reason): cues for back brace but able to don/ doff Lower Body Dressing: Supervision/safety Lower Body Dressing Details (indicate cue type and reason): able to figure 4 cross so no AE needed Toilet Transfer: Supervision/safety   Toileting- Clothing Manipulation and Hygiene: Supervision/safety       Functional mobility during ADLs: Supervision/safety;Rolling walker (2 wheels)  Back handout provided and reviewed adls in detail. Pt educated  on: clothing between brace, never sleep in brace, set an alarm at night for medication, avoid sitting for long periods of time, correct bed positioning for sleeping, correct sequence for bed  mobility, avoiding lifting more than 5 pounds and never wash directly over incision. All education is complete and patient indicates understanding.      Vision Baseline Vision/History: 0 No visual deficits Patient Visual Report: No change from baseline       Perception     Praxis      Pertinent Vitals/Pain Pain Assessment Pain Assessment: Faces Faces Pain Scale: Hurts little more Pain Location: back Pain Descriptors / Indicators: Discomfort, Grimacing, Operative site guarding Pain Intervention(s): Monitored during session, Premedicated before session, Repositioned     Hand Dominance Right   Extremity/Trunk Assessment Upper Extremity Assessment Upper Extremity Assessment: Overall WFL for tasks assessed   Lower Extremity Assessment Lower Extremity Assessment: Defer to PT evaluation   Cervical / Trunk Assessment Cervical / Trunk Assessment: Back Surgery   Communication Communication Communication: No difficulties   Cognition Arousal/Alertness: Awake/alert Behavior During Therapy: WFL for tasks assessed/performed Overall Cognitive Status: Within Functional Limits for tasks assessed                                       General Comments  incision dry and intact. pt reports feeling cold and air adjusted in the room and warm blanket applied    Exercises     Shoulder Instructions      Home Living Family/patient expects to be discharged to:: Private residence Living Arrangements: Alone Available Help at Discharge: Family;Available 24 hours/day Type of Home: House Home Access: Stairs to enter [employer] of Steps: 1 (short) Entrance Stairs-Rails: None Home Layout: One level     Bathroom Shower/Tub: Producer, television/film/video: Handicapped height     Home Equipment: Shower seat   Additional Comments: Pt plans to go stay with her mother upon d/c, who can be with her 24/7 and who has a similar home set-up to above but mother also  has grab bars in the shower; boyfriend also able to assist PRN if needed. She has an outside dog that boyfriend will take care of and mother has an outside dog      Prior Functioning/Environment Prior Level of Function : Independent/Modified Independent             Mobility Comments: No AD          OT Problem List: Decreased strength;Decreased activity tolerance;Impaired balance (sitting and/or standing);Decreased safety awareness;Decreased knowledge of use of DME or AE;Decreased knowledge of precautions      OT Treatment/Interventions: Self-care/ADL training;Therapeutic exercise;Energy conservation;DME and/or AE instruction;Manual therapy;Modalities;Therapeutic activities;Patient/family education;Balance training    OT Goals(Current goals can be found in the care plan section) Acute Rehab OT Goals Patient Stated Goal: to go home tomorrow OT Goal Formulation: With patient Time For Goal Achievement: 03/08/23 Potential to Achieve Goals: Good  OT Frequency: Min 2X/week    Co-evaluation              AM-PAC OT "6 Clicks" Daily Activity     Outcome Measure Help from another person eating meals?: A Little Help from another person taking care of personal grooming?: A Little Help from another person toileting, which includes using toliet, bedpan, or urinal?: A Little Help from another person bathing (including washing, rinsing, drying)?: A Little Help from another  person to put on and taking off regular upper body clothing?: A Little Help from another person to put on and taking off regular lower body clothing?: A Little 6 Click Score: 18   End of Session Equipment Utilized During Treatment: Rolling walker (2 wheels);Back brace Nurse Communication: Mobility status;Precautions  Activity Tolerance: Patient tolerated treatment well Patient left: in bed;with call bell/phone within reach  OT Visit Diagnosis: Unsteadiness on feet (R26.81);Muscle weakness (generalized) (M62.81)                 Time: 1441-1500 OT Time Calculation (min): 19 min Charges:  OT General Charges $OT Visit: 1 Visit OT Evaluation $OT Eval Moderate Complexity: 1 Mod   Brynn, OTR/L  Acute Rehabilitation Services Office: [phone] .   Mateo Flow 03/01/2023, 4:06 PM

## 2023-03-01 NOTE — Progress Notes (Signed)
Subjective: The patient is alert and pleasant.  Her back is appropriately sore.  She wants to go home tomorrow.  Objective: Vital signs in last 24 hours: Temp:  [97.6 F (36.4 C)-99 F (37.2 C)] 99 F (37.2 C) (06/11 0719) Pulse Rate:  [77-105] 99 (06/11 0719) Resp:  [11-20] 16 (06/11 0719) BP: (98-129)/(53-67) 125/57 (06/11 0719) SpO2:  [91 %-98 %] 96 % (06/11 0719) Weight:  [72.6 kg] 72.6 kg (06/10 0949) Estimated body mass index is 25.82 kg/m as calculated from the following:   Height as of this encounter: 5\' 6"  (1.676 m).   Weight as of this encounter: 72.6 kg.   Intake/Output from previous day: 06/10 0701 - 06/11 0700 In: 3680 [P.O.:1080; I.V.:2400; IV Piggyback:200] Out: 675 [Urine:550; Blood:125] Intake/Output this shift: No intake/output data recorded.  Physical exam the patient is alert and oriented.  Her strength is normal.  Her dressing is clean and dry.  Lab Results: No results for input(s): "WBC", "HGB", "HCT", "PLT" in the last 72 hours. BMET No results for input(s): "NA", "K", "CL", "CO2", "GLUCOSE", "BUN", "CREATININE", "CALCIUM" in the last 72 hours.  Studies/Results: DG Lumbar Spine 2-3 Views  Result Date: 02/28/2023 CLINICAL DATA:  Elective surgery EXAM: LUMBAR SPINE - 2-3 VIEW COMPARISON:  Preoperative imaging. FINDINGS: Three fluoroscopic spot views of the lumbar spine obtained in the operating room in frontal and lateral projection. Images obtained during L3 through L5 pedicle screw placement with interbody spacers. Fluoroscopy time 35.2 seconds. Dose 25.41 mGy IMPRESSION: Intraoperative fluoroscopy during lumbar fusion. Electronically Signed   By: Narda Rutherford M.D.   On: 02/28/2023 20:16   DG Lumbar Spine 1 View  Result Date: 02/28/2023 CLINICAL DATA:  Elective surgery. EXAM: LUMBAR SPINE - 1 VIEW COMPARISON:  Preoperative exam demonstrating 5 non-rib-bearing lumbar vertebra. FINDINGS: Portable cross-table lateral view of the lumbar spine obtained  in the operating room. Surgical instrument localizes posterior to the L3-L4 level between the spinous processes. IMPRESSION: Surgical instrument posterior to the L3-L4 level. Electronically Signed   By: Narda Rutherford M.D.   On: 02/28/2023 20:14   DG C-Arm 1-60 Min-No Report  Result Date: 02/28/2023 Fluoroscopy was utilized by the requesting physician.  No radiographic interpretation.   DG C-Arm 1-60 Min-No Report  Result Date: 02/28/2023 Fluoroscopy was utilized by the requesting physician.  No radiographic interpretation.    Assessment/Plan: Postop day #1: We will mobilize her with PT.  She will likely go home tomorrow.  LOS: 1 day     [name] 03/01/2023, 7:33 AM     Patient ID: [name], female   DOB: May 11, 1966, 57 y.o.   MRN: [id number]

## 2023-03-02 MED ORDER — DEXAMETHASONE 6 MG PO TABS: 6.0000 mg | ORAL_TABLET | Freq: Two times a day (BID) | ORAL | 0 refills | Status: AC

## 2023-03-02 MED ORDER — OXYCODONE-ACETAMINOPHEN 5-325 MG PO TABS: 1.0000 | ORAL_TABLET | ORAL | 0 refills | Status: DC | PRN

## 2023-03-02 MED ORDER — DEXAMETHASONE 4 MG PO TABS
8.0000 mg | ORAL_TABLET | Freq: Four times a day (QID) | ORAL | Status: DC
  Administered 2023-03-02: 8 mg via ORAL
  Filled 2023-03-02: qty 2

## 2023-03-02 NOTE — Progress Notes (Signed)
Occupational Therapy Treatment Patient Details Name: [name] MRN: [id number] DOB: 02/10/1966 Today's Date: 03/02/2023   History of present illness 57 y.o. female who presented 02/28/23 for elective left L3-5 TLIF and bil L3-5 laminotomy/foraminotomies/medial facetectomy. PMH: COPD, GERD, HTN, HLD, psychotic depression   OT comments  Pt demonstrate adequate level for d/c home with family (A) at this time. Pt reports pain and premedicated. Pt provided secondary medication during session. Pt return to supine for comfort at the end of session. Recommendation for d/c home    Recommendations for follow up therapy are one component of a multi-disciplinary discharge planning process, led by the attending physician.  Recommendations may be updated based on patient status, additional functional criteria and insurance authorization.    Assistance Recommended at Discharge None  Patient can return home with the following  Assist for transportation   Equipment Recommendations  None recommended by OT    Recommendations for Other Services      Precautions / Restrictions Precautions Precautions: Back Precaution Comments: reviewed back precautions Required Braces or Orthoses: Spinal Brace Spinal Brace: Lumbar corset;Applied in sitting position       Mobility Bed Mobility Overal bed mobility: Needs Assistance Bed Mobility: Sit to Supine, Rolling Rolling: Min guard         General bed mobility comments: mod cues for sequence as pt was twisting trunk to enter the bed. pt educated with good return demo. pt prefers side lying and reports inability to apply pressure to back    Transfers Overall transfer level: Needs assistance Equipment used: Rolling walker (2 wheels) Transfers: Sit to/from Stand Sit to Stand: Min guard                 Balance Overall balance assessment: Needs assistance Sitting-balance support: No upper extremity supported, Feet supported Sitting  balance-Leahy Scale: Good     Standing balance support: Bilateral upper extremity supported, During functional activity, Reliant on assistive device for balance Standing balance-Leahy Scale: Fair                             ADL either performed or assessed with clinical judgement   ADL Overall ADL's : Needs assistance/impaired                 Upper Body Dressing : Modified independent Upper Body Dressing Details (indicate cue type and reason): don doff brace put on gown Lower Body Dressing: Supervision/safety   Toilet Transfer: Supervision/safety   Toileting- Clothing Manipulation and Hygiene: Sit to/from stand Toileting - Clothing Manipulation Details (indicate cue type and reason): standard commode despite 3n1 in room     Functional mobility during ADLs: Supervision/safety;Rolling walker (2 wheels) General ADL Comments: pt verbalized having pain and askign how long until the pain resolves. pt advised that day 2 is a day patients are typically very sore through day 5. pt heard praying at times during session due to discomfort    Extremity/Trunk Assessment Upper Extremity Assessment Upper Extremity Assessment: Overall WFL for tasks assessed   Lower Extremity Assessment Lower Extremity Assessment: Overall WFL for tasks assessed        Vision   Vision Assessment?: No apparent visual deficits   Perception     Praxis      Cognition Arousal/Alertness: Awake/alert Behavior During Therapy: WFL for tasks assessed/performed Overall Cognitive Status: Within Functional Limits for tasks assessed  Exercises      Shoulder Instructions       General Comments incision dry and intact at thist ime    Pertinent Vitals/ Pain       Pain Assessment Pain Assessment: Faces Faces Pain Scale: Hurts even more Pain Location: back Pain Descriptors / Indicators: Sore, Grimacing Pain Intervention(s): Monitored  during session, Premedicated before session, Repositioned, Limited activity within patient's tolerance  Home Living                                          Prior Functioning/Environment              Frequency  Min 2X/week        Progress Toward Goals  OT Goals(current goals can now be found in the care plan section)  Progress towards OT goals: Progressing toward goals  Acute Rehab OT Goals Patient Stated Goal: to know how long patients can stay here OT Goal Formulation: With patient Time For Goal Achievement: 03/08/23 Potential to Achieve Goals: Good ADL Goals Pt Will Perform Tub/Shower Transfer: Tub transfer;ambulating;rolling walker;with supervision Additional ADL Goal #1: pt will complete complete adl with 100% back precautions  Plan Discharge plan remains appropriate    Co-evaluation                 AM-PAC OT "6 Clicks" Daily Activity     Outcome Measure   Help from another person eating meals?: A Little Help from another person taking care of personal grooming?: A Little Help from another person toileting, which includes using toliet, bedpan, or urinal?: A Little Help from another person bathing (including washing, rinsing, drying)?: A Little Help from another person to put on and taking off regular upper body clothing?: A Little Help from another person to put on and taking off regular lower body clothing?: A Little 6 Click Score: 18    End of Session Equipment Utilized During Treatment: Rolling walker (2 wheels);Back brace  OT Visit Diagnosis: Unsteadiness on feet (R26.81);Muscle weakness (generalized) (M62.81)   Activity Tolerance Patient tolerated treatment well   Patient Left in bed;with call bell/phone within reach   Nurse Communication Mobility status;Precautions        Time: 1610-9604 OT Time Calculation (min): 20 min  Charges: OT General Charges $OT Visit: 1 Visit OT Treatments $Self Care/Home Management : 8-22  mins   Brynn, OTR/L  Acute Rehabilitation Services Office: [phone] .   Mateo Flow 03/02/2023, 10:20 AM

## 2023-03-02 NOTE — Discharge Summary (Signed)
Physician Discharge Summary     Providing Compassionate, Quality Care - Together   Patient ID: [name] MRN: [id number] DOB/AGE: 1966-04-22 57 y.o.  Admit date: 02/28/2023 Discharge date: 03/02/2023  Admission Diagnoses: Degenerative scoliosis in adult patient  Discharge Diagnoses:  Principal Problem:   Degenerative scoliosis in adult patient   Discharged Condition: good  Hospital Course: Patient underwent a two level lumbar fusion by Dr. Lovell Sheehan on 02/28/2023. She was admitted to 3C04  following recovery from anesthesia in the PACU. Her postoperative course has been uncomplicated. She has worked with both physical and occupational therapies who feel the patient is ready for discharge home. She is ambulating independently with the aid of a walker. She is tolerating a normal diet. She is not having any bowel or bladder dysfunction. Her pain is reasonably controlled with oral pain medication. She is ready for discharge home.   Consults: PT/OT/TOC  Significant Diagnostic Studies: radiology: DG Lumbar Spine 2-3 Views  Result Date: 02/28/2023 CLINICAL DATA:  Elective surgery EXAM: LUMBAR SPINE - 2-3 VIEW COMPARISON:  Preoperative imaging. FINDINGS: Three fluoroscopic spot views of the lumbar spine obtained in the operating room in frontal and lateral projection. Images obtained during L3 through L5 pedicle screw placement with interbody spacers. Fluoroscopy time 35.2 seconds. Dose 25.41 mGy IMPRESSION: Intraoperative fluoroscopy during lumbar fusion. Electronically Signed   By: Narda Rutherford M.D.   On: 02/28/2023 20:16   DG Lumbar Spine 1 View  Result Date: 02/28/2023 CLINICAL DATA:  Elective surgery. EXAM: LUMBAR SPINE - 1 VIEW COMPARISON:  Preoperative exam demonstrating 5 non-rib-bearing lumbar vertebra. FINDINGS: Portable cross-table lateral view of the lumbar spine obtained in the operating room. Surgical instrument localizes posterior to the L3-L4 level between the spinous  processes. IMPRESSION: Surgical instrument posterior to the L3-L4 level. Electronically Signed   By: Narda Rutherford M.D.   On: 02/28/2023 20:14   DG C-Arm 1-60 Min-No Report  Result Date: 02/28/2023 Fluoroscopy was utilized by the requesting physician.  No radiographic interpretation.   DG C-Arm 1-60 Min-No Report  Result Date: 02/28/2023 Fluoroscopy was utilized by the requesting physician.  No radiographic interpretation.     Treatments: surgery: Bilateral L3-4 and L4-5 laminotomy/foraminotomies/medial facetectomy to decompress the bilateral L3, L4 and L5 nerve roots(the work required to do this was in addition to the work required to do the posterior lumbar interbody fusion because of the patient's spinal stenosis, facet arthropathy. Etc. requiring a wide decompression of the nerve roots.);  Left L3-4 and L4-5 transforaminal lumbar interbody fusion with local morselized autograft bone and Zimmer DBM; insertion of interbody prosthesis at L3-4 and L4-5 (globus peek expandable interbody prosthesis); posterior segmental instrumentation from L3 to L5 with globus titanium pedicle screws and rods; posterior lateral arthrodesis at L3-4 and L4-5 with local morselized autograft bone and Zimmer DBM.    Discharge Exam: Blood pressure 108/67, pulse 68, temperature 99.4 F (37.4 C), temperature source Oral, resp. rate 16, height 5\' 6"  (1.676 m), weight 72.6 kg, SpO2 100 %.  Per report: Alert and oriented x 4 PERRLA CN II-XII grossly intact MAE, Strength and sensation intact Incision is covered with Honeycomb dressing and Steri Strips; Dressing is clean, dry, and intact   Disposition: Discharge disposition: 01-Home or Self Care       Discharge Instructions     Call MD for:  difficulty breathing, headache or visual disturbances   Complete by: As directed    Call MD for:  hives   Complete by: As directed  Call MD for:  persistant nausea and vomiting   Complete by: As directed    Call  MD for:  redness, tenderness, or signs of infection (pain, swelling, redness, odor or green/yellow discharge around incision site)   Complete by: As directed    Call MD for:  severe uncontrolled pain   Complete by: As directed    Call MD for:  temperature >100.4   Complete by: As directed    Diet - low sodium heart healthy   Complete by: As directed    If the dressing is still on your incision site when you go home, remove it on the third day after your surgery date. Remove dressing if it begins to fall off, or if it is dirty or damaged before the third day.   Complete by: As directed    Increase activity slowly   Complete by: As directed       Allergies as of 03/02/2023       Reactions   Levofloxacin Other (See Comments), Rash   Rash all over redness   Bactrim Ds [sulfamethoxazole-trimethoprim] Nausea Only   Lipitor [atorvastatin] Other (See Comments)   Myalgia   Tape Hives   Zetia [ezetimibe]    Muscle pain   Codeine Nausea Only   Other Rash   bandaids leave a red area if left on too long        Medication List     STOP taking these medications    HYDROcodone-acetaminophen 5-325 MG tablet Commonly known as: NORCO/VICODIN       TAKE these medications    acetaminophen 650 MG CR tablet Commonly known as: TYLENOL Take 650-1,300 mg by mouth every 8 (eight) hours as needed for pain.   albuterol 108 (90 Base) MCG/ACT inhaler Commonly known as: Ventolin HFA INHALE 2 PUFFS INTO LUNGS EVERY 6 TO 8 HOURS AS NEEDED FOR WHEEZING   ALPRAZolam 0.25 MG tablet Commonly known as: XANAX TAKE 1 TABLET(0.25 MG) BY MOUTH DAILY AS NEEDED FOR ANXIETY   amLODipine 10 MG tablet Commonly known as: NORVASC TAKE 1 TABLET BY MOUTH DAILY   buPROPion 300 MG 24 hr tablet Commonly known as: WELLBUTRIN XL TAKE 1 TABLET(300 MG) BY MOUTH DAILY   cyclobenzaprine 10 MG tablet Commonly known as: FLEXERIL Take 1 tablet (10 mg total) by mouth 3 (three) times daily as needed for muscle  spasms.   dexamethasone 6 MG tablet Commonly known as: DECADRON Take 1 tablet (6 mg total) by mouth 2 (two) times daily with a meal for 2 days.   dicyclomine 10 MG capsule Commonly known as: BENTYL TAKE 1 CAPSULE(10 MG) BY MOUTH FOUR TIMES DAILY BEFORE MEALS AND AT BEDTIME What changed:  how much to take how to take this when to take this reasons to take this additional instructions   DSS 100 MG Caps Take 100 mg by mouth daily as needed (constipation).   famotidine 40 MG tablet Commonly known as: PEPCID TAKE 1 TABLET(40 MG) BY MOUTH DAILY What changed: See the new instructions.   fluticasone 50 MCG/ACT nasal spray Commonly known as: FLONASE SHAKE LIQUID AND USE 2 SPRAYS IN EACH NOSTRIL DAILY   fluticasone-salmeterol 115-21 MCG/ACT inhaler Commonly known as: Advair HFA Inhale 2 puffs into the lungs 2 (two) times daily. What changed:  when to take this reasons to take this   furosemide 20 MG tablet Commonly known as: LASIX Take 1 tablet (20 mg total) by mouth daily as needed for edema.   lubiprostone 8 MCG  capsule Commonly known as: AMITIZA Take 1 capsule (8 mcg total) by mouth 2 (two) times daily with a meal.   montelukast 10 MG tablet Commonly known as: SINGULAIR TAKE 1 TABLET(10 MG) BY MOUTH AT BEDTIME What changed: See the new instructions.   nicotine 21 mg/24hr patch Commonly known as: NICODERM CQ - dosed in mg/24 hours APPLY 1 PATCH(21 MG) TOPICALLY TO THE SKIN DAILY   nitroGLYCERIN 0.4 MG SL tablet Commonly known as: NITROSTAT DISSOLVE 1 TABLET BY MOUTH UNDER THE TONGUE AS NEEDED FOR CHEST PAIN EVERY 5 MINUTES UP TO 3 TIMES   omeprazole 40 MG capsule Commonly known as: PRILOSEC Take 1 capsule (40 mg total) by mouth daily.   oxyCODONE-acetaminophen 5-325 MG tablet Commonly known as: Percocet Take 1-2 tablets by mouth every 4 (four) hours as needed for severe pain.   Ozempic (2 MG/DOSE) 8 MG/3ML Sopn Generic drug: Semaglutide (2 MG/DOSE) INJECT 2  MG AS DIRECTED ONCE A WEEK   Repatha SureClick 140 MG/ML Soaj Generic drug: Evolocumab Inject 140 mg as directed every 14 (fourteen) days.   triamcinolone 0.025 % ointment Commonly known as: KENALOG Apply 1 Application topically 2 (two) times daily. Apply to corners of mouth twice daily               Durable Medical Equipment  (From admission, onward)           Start     Ordered   03/01/23 0825  For home use only DME Walker rolling  Once       Question Answer Comment  Walker: With 5 Inch Wheels   Patient needs a walker to treat with the following condition S/P lumbar spinal fusion      03/01/23 0825              Discharge Care Instructions  (From admission, onward)           Start     Ordered   03/02/23 0000  If the dressing is still on your incision site when you go home, remove it on the third day after your surgery date. Remove dressing if it begins to fall off, or if it is dirty or damaged before the third day.        03/02/23 1302            Follow-up Information     Tressie Stalker, MD. Go on 03/18/2023.   Specialty: Neurosurgery Why: First post op appointment with x-rays is on 03/18/2023 at 1 pm. Contact information: [address] [place] [address] [phone]                 Signed: Val Eagle, DNP, AGNP-C Nurse Practitioner  [facility] [address], [place], [place] [address] P: [phone]    F: [phone]  03/02/2023, 1:02 PM

## 2023-03-02 NOTE — Progress Notes (Signed)
Patient alert and oriented, voiding adequately, skin clean, dry and intact without evidence of skin break down, or symptoms of complications - no redness or edema noted, only slight tenderness at site.  Patient states pain is manageable at time of discharge. Patient has an appointment with MD in 2 weeks 

## 2023-03-02 NOTE — Discharge Instructions (Signed)
Wound Care Keep incision covered and dry until post op day 3. You may remove the Honeycomb dressing on post op day 3. Leave steri-strips on back.  They will fall off by themselves. Do not put any creams, lotions, or ointments on incision. You are fine to shower. Let water run over incision and pat dry.  Activity Walk each and every day, increasing distance each day. No lifting greater than 5 lbs.  Avoid excessive back motion. No driving for 2 weeks; may ride as a passenger locally.  Diet Resume your normal diet.   Return to Work Will be discussed at your follow up appointment.  Call Your Doctor If Any of These Occur Redness, drainage, or swelling at the wound.  Temperature greater than 101 degrees. Severe pain not relieved by pain medication. Incision starts to come apart.  Follow Up Appt Call [phone] today for appointment in 2-3 weeks if you don't already have one or for any problems.  If you have any hardware placed in your spine, you will need an x-ray before your appointment.  

## 2023-03-02 NOTE — Plan of Care (Signed)

## 2023-03-02 NOTE — Progress Notes (Signed)
Physical Therapy Treatment Patient Details Name: [name] MRN: [id number] DOB: 10-18-1965 Today's Date: 03/02/2023   History of Present Illness 57 y.o. female who presented 02/28/23 for elective left L3-5 TLIF and bil L3-5 laminotomy/foraminotomies/medial facetectomy. PMH: COPD, GERD, HTN, HLD, psychotic depression    PT Comments    Pt up in room on arrival and agreeable to session with continued progress towards acute goals. Pt continues to be limited by pain and anticipation of pain, however pt progressing gait distance >500' with supervision assist for safety and RW for support. Pt grossly supervision for transfers with pt demonstrating safe hand placement. Continued education on appropriate activity recommendations, safe car entry exit and pt able to recall all precautions and demonstrate adherence throughout session. Anticipate safe discharge, with assist level outlined below, once medically cleared, will continue to follow acutely.     Recommendations for follow up therapy are one component of a multi-disciplinary discharge planning process, led by the attending physician.  Recommendations may be updated based on patient status, additional functional criteria and insurance authorization.  Follow Up Recommendations       Assistance Recommended at Discharge Intermittent Supervision/Assistance  Patient can return home with the following A little help with bathing/dressing/bathroom;Assistance with cooking/housework;Assist for transportation;Help with stairs or ramp for entrance   Equipment Recommendations  Rolling walker (2 wheels)    Recommendations for Other Services       Precautions / Restrictions Precautions Precautions: Back Precaution Comments: reviewed back precautions Required Braces or Orthoses: Spinal Brace Spinal Brace: Lumbar corset;Applied in sitting position     Mobility  Bed Mobility Overal bed mobility: Needs Assistance             General bed  mobility comments: pt up in room on arrival    Transfers Overall transfer level: Needs assistance Equipment used: Rolling walker (2 wheels) Transfers: Sit to/from Stand Sit to Stand: Supervision           General transfer comment: min gaurd for safety    Ambulation/Gait Ambulation/Gait assistance: Min guard, Supervision Gait Distance (Feet): 510 Feet Assistive device: Rolling walker (2 wheels) Gait Pattern/deviations: Step-through pattern, Decreased stride length Gait velocity: decr     General Gait Details: slow cautious gait, with increasing gait speed with increased distance and confidence, cues for RW proximity and relaxing shoulders down from ears, progressing to supervision   Stairs             Wheelchair Mobility    Modified Rankin (Stroke Patients Only)       Balance Overall balance assessment: Needs assistance Sitting-balance support: No upper extremity supported, Feet supported Sitting balance-Leahy Scale: Good     Standing balance support: Bilateral upper extremity supported, During functional activity, Reliant on assistive device for balance Standing balance-Leahy Scale: Fair                              Cognition Arousal/Alertness: Awake/alert Behavior During Therapy: WFL for tasks assessed/performed Overall Cognitive Status: Within Functional Limits for tasks assessed                                          Exercises      General Comments General comments (skin integrity, edema, etc.): incision dry and intact at thist ime      Pertinent Vitals/Pain Pain Assessment Pain Assessment: Faces  Faces Pain Scale: Hurts little more Pain Location: back Pain Descriptors / Indicators: Sore, Grimacing Pain Intervention(s): Monitored during session, Limited activity within patient's tolerance, Premedicated before session    Home Living                          Prior Function            PT Goals  (current goals can now be found in the care plan section) Acute Rehab PT Goals PT Goal Formulation: With patient/family Time For Goal Achievement: 03/08/23 Progress towards PT goals: Progressing toward goals    Frequency    Min 5X/week      PT Plan      Co-evaluation              AM-PAC PT "6 Clicks" Mobility   Outcome Measure  Help needed turning from your back to your side while in a flat bed without using bedrails?: A Little Help needed moving from lying on your back to sitting on the side of a flat bed without using bedrails?: A Little Help needed moving to and from a bed to a chair (including a wheelchair)?: A Little Help needed standing up from a chair using your arms (e.g., wheelchair or bedside chair)?: A Little Help needed to walk in hospital room?: A Little Help needed climbing 3-5 steps with a railing? : A Little 6 Click Score: 18    End of Session Equipment Utilized During Treatment: Back brace Activity Tolerance: Patient tolerated treatment well Patient left: in chair;with call bell/phone within reach Nurse Communication: Mobility status PT Visit Diagnosis: Unsteadiness on feet (R26.81);Other abnormalities of gait and mobility (R26.89);Difficulty in walking, not elsewhere classified (R26.2);Pain     Time: 1610-9604 PT Time Calculation (min) (ACUTE ONLY): 17 min  Charges:  $Gait Training: 8-22 mins                     Mearl Harewood R. PTA Acute Rehabilitation Services Office: [phone]   Catalina Antigua 03/02/2023, 12:04 PM

## 2023-03-02 NOTE — Progress Notes (Signed)
Subjective: The patient is alert and pleasant.  She continues to complain of back pain.  Objective: Vital signs in last 24 hours: Temp:  [98.6 F (37 C)-99.1 F (37.3 C)] 99.1 F (37.3 C) (06/12 0321) Pulse Rate:  [84-103] 103 (06/11 2233) Resp:  [16-18] 18 (06/12 0321) BP: (95-114)/(46-62) 107/46 (06/12 0321) SpO2:  [94 %-99 %] 96 % (06/12 0321) Estimated body mass index is 25.82 kg/m as calculated from the following:   Height as of this encounter: 5\' 6"  (1.676 m).   Weight as of this encounter: 72.6 kg.   Intake/Output from previous day: 06/11 0701 - 06/12 0700 In: 1200 [P.O.:1200] Out: -  Intake/Output this shift: No intake/output data recorded.  Physical exam the patient is alert and pleasant.  She is ambulating with a walker.  Her dressing is clean and dry.  Lab Results: No results for input(s): "WBC", "HGB", "HCT", "PLT" in the last 72 hours. BMET No results for input(s): "NA", "K", "CL", "CO2", "GLUCOSE", "BUN", "CREATININE", "CALCIUM" in the last 72 hours.  Studies/Results: DG Lumbar Spine 2-3 Views  Result Date: 02/28/2023 CLINICAL DATA:  Elective surgery EXAM: LUMBAR SPINE - 2-3 VIEW COMPARISON:  Preoperative imaging. FINDINGS: Three fluoroscopic spot views of the lumbar spine obtained in the operating room in frontal and lateral projection. Images obtained during L3 through L5 pedicle screw placement with interbody spacers. Fluoroscopy time 35.2 seconds. Dose 25.41 mGy IMPRESSION: Intraoperative fluoroscopy during lumbar fusion. Electronically Signed   By: Narda Rutherford M.D.   On: 02/28/2023 20:16   DG Lumbar Spine 1 View  Result Date: 02/28/2023 CLINICAL DATA:  Elective surgery. EXAM: LUMBAR SPINE - 1 VIEW COMPARISON:  Preoperative exam demonstrating 5 non-rib-bearing lumbar vertebra. FINDINGS: Portable cross-table lateral view of the lumbar spine obtained in the operating room. Surgical instrument localizes posterior to the L3-L4 level between the spinous  processes. IMPRESSION: Surgical instrument posterior to the L3-L4 level. Electronically Signed   By: Narda Rutherford M.D.   On: 02/28/2023 20:14   DG C-Arm 1-60 Min-No Report  Result Date: 02/28/2023 Fluoroscopy was utilized by the requesting physician.  No radiographic interpretation.   DG C-Arm 1-60 Min-No Report  Result Date: 02/28/2023 Fluoroscopy was utilized by the requesting physician.  No radiographic interpretation.    Assessment/Plan: Postop day #2: I will add a couple doses of Decadron to see if we can get her pain under better control.  She may go home later on today if she feels better.  I have answered all the patient's, and her husband, questions.  LOS: 2 days     [name] 03/02/2023, 8:22 AM     Patient ID: [name], female   DOB: 06-16-1966, 57 y.o.   MRN: [id number]

## 2023-03-03 LAB — BPAM RBC
Blood Product Expiration Date: 202406292359
Unit Type and Rh: 9500

## 2023-03-03 LAB — TYPE AND SCREEN
PT AG Type: NEGATIVE
Unit division: 0

## 2023-03-08 MED FILL — Heparin Sodium (Porcine) Inj 1000 Unit/ML: INTRAMUSCULAR | Qty: 30 | Status: AC

## 2023-03-18 DIAGNOSIS — M5136 Other intervertebral disc degeneration, lumbar region: Secondary | ICD-10-CM | POA: Diagnosis not present

## 2023-03-18 DIAGNOSIS — M5416 Radiculopathy, lumbar region: Secondary | ICD-10-CM | POA: Diagnosis not present

## 2023-03-28 ENCOUNTER — Ambulatory Visit: Payer: Medicaid Other | Admitting: Oncology

## 2023-03-28 ENCOUNTER — Other Ambulatory Visit: Payer: Medicaid Other

## 2023-03-30 ENCOUNTER — Other Ambulatory Visit: Payer: Self-pay | Admitting: Family Medicine

## 2023-03-30 DIAGNOSIS — I10 Essential (primary) hypertension: Secondary | ICD-10-CM

## 2023-04-04 ENCOUNTER — Other Ambulatory Visit: Payer: Self-pay | Admitting: Family Medicine

## 2023-04-05 ENCOUNTER — Other Ambulatory Visit: Payer: Medicaid Other

## 2023-04-05 ENCOUNTER — Ambulatory Visit: Payer: Medicaid Other | Admitting: Oncology

## 2023-04-06 ENCOUNTER — Other Ambulatory Visit: Payer: Self-pay | Admitting: Family Medicine

## 2023-04-06 DIAGNOSIS — F411 Generalized anxiety disorder: Secondary | ICD-10-CM

## 2023-04-07 ENCOUNTER — Telehealth: Payer: Self-pay | Admitting: Oncology

## 2023-04-07 NOTE — Telephone Encounter (Signed)
04/07/23 Patient called and cancelled appt.Did not want to reschedule

## 2023-04-11 NOTE — Assessment & Plan Note (Signed)
Continue advair 2 puffs twice daily.

## 2023-04-11 NOTE — Progress Notes (Unsigned)
Subjective:  Patient ID: [name], female    DOB: Sep 15, 1966  Age: 57 y.o. MRN: [id number]  Chief Complaint  Patient presents with   Medical Management of Chronic Issues    HPI Hyperlipidemia/Aortic atherosclerosis:  Intolerant to lipitor and zetia. Taking Repatha 140 mg every 14 days.  Hypertension: On amlodipine 10 mg daily, furosemide 20 mg as needed (rarely takes).  GERD: Taking omeprazole 40 mg daily, Pepcid 40 mg daily. Heartburn well controlled.  Prediabetes; a1c 5.4. Eating healthy.  On ozempic 2 mg weekly.   Chronic back pain: on gabapentin 300 mg before bed as needed. Cyclobenzaprine 10 mg three times a day.  Asthma: Albuterol hfa 2 puffs four times a day as needed shortness of breath. On Advair 2 puffs twice one daily. On singulair.   Tobacco disorder:  Nicoderm patches 21 mcg daily. Smoking 1 ppd.   Insomnia: taking magnesium which is helping. Mag 500 mg twice daily.      04/12/2023    7:47 AM 06/24/2022    2:28 PM  Depression screen PHQ 2/9  Decreased Interest 1 0  Down, Depressed, Hopeless 1 0  PHQ - 2 Score 2 0  Altered sleeping 2   Tired, decreased energy 2   Change in appetite 0   Feeling bad or failure about yourself  1   Trouble concentrating 0   Moving slowly or fidgety/restless 0   Suicidal thoughts 0   PHQ-9 Score 7   Difficult doing work/chores Not difficult at all         04/12/2023    7:47 AM  Fall Risk   Falls in the past year? 0  Number falls in past yr: 0  Injury with Fall? 0  Risk for fall due to : No Fall Risks  Follow up Falls evaluation completed    Patient Care Team: Blane Ohara, MD as PCP - General (Family Medicine) Cloria Spring, MD as Referring Physician (Internal Medicine)   Review of Systems  Constitutional:  Negative for chills, fatigue and fever.  HENT:  Negative for congestion, ear pain, rhinorrhea and sore throat.   Respiratory:  Negative for cough and shortness of breath.   Cardiovascular:  Negative  for chest pain.  Gastrointestinal:  Negative for abdominal pain, constipation, diarrhea, nausea and vomiting.  Genitourinary:  Negative for dysuria and urgency.  Musculoskeletal:  Negative for back pain and myalgias.  Neurological:  Negative for dizziness, weakness, light-headedness and headaches.  Psychiatric/Behavioral:  Negative for dysphoric mood. The patient is not nervous/anxious.     Current Outpatient Medications on File Prior to Visit  Medication Sig Dispense Refill   acetaminophen (TYLENOL) 650 MG CR tablet Take 650-1,300 mg by mouth every 8 (eight) hours as needed for pain.     albuterol (VENTOLIN HFA) 108 (90 Base) MCG/ACT inhaler INHALE 2 PUFFS INTO LUNGS EVERY 6 TO 8 HOURS AS NEEDED FOR WHEEZING 18 g 1   ALPRAZolam (XANAX) 0.25 MG tablet TAKE 1 TABLET(0.25 MG) BY MOUTH DAILY AS NEEDED FOR ANXIETY 30 tablet 3   cyclobenzaprine (FLEXERIL) 10 MG tablet Take 1 tablet (10 mg total) by mouth 3 (three) times daily as needed for muscle spasms. 270 tablet 0   Docusate Sodium (DSS) 100 MG CAPS Take 100 mg by mouth daily as needed (constipation).     Evolocumab (REPATHA SURECLICK) 140 MG/ML SOAJ Inject 140 mg as directed every 14 (fourteen) days. 2 mL 2   famotidine (PEPCID) 40 MG tablet TAKE 1 TABLET(40 MG) BY MOUTH  DAILY (Patient taking differently: Take 40 mg by mouth daily as needed for heartburn or indigestion.) 90 tablet 1   fluticasone (FLONASE) 50 MCG/ACT nasal spray SHAKE LIQUID AND USE 2 SPRAYS IN EACH NOSTRIL DAILY 16 g 6   fluticasone-salmeterol (ADVAIR HFA) 115-21 MCG/ACT inhaler Inhale 2 puffs into the lungs 2 (two) times daily. (Patient taking differently: Inhale 2 puffs into the lungs 2 (two) times daily as needed (asthma).) 3 each 3   furosemide (LASIX) 20 MG tablet Take 1 tablet (20 mg total) by mouth daily as needed for edema. 1 tablet 0   lubiprostone (AMITIZA) 8 MCG capsule Take 1 capsule (8 mcg total) by mouth 2 (two) times daily with a meal. 60 capsule 1   montelukast  (SINGULAIR) 10 MG tablet TAKE 1 TABLET(10 MG) BY MOUTH AT BEDTIME (Patient taking differently: Take 10 mg by mouth every Monday, Wednesday, and Friday.) 90 tablet 0   nicotine (NICODERM CQ - DOSED IN MG/24 HOURS) 21 mg/24hr patch APPLY 1 PATCH(21 MG) TOPICALLY TO THE SKIN DAILY 28 patch 0   nitroGLYCERIN (NITROSTAT) 0.4 MG SL tablet DISSOLVE 1 TABLET BY MOUTH UNDER THE TONGUE AS NEEDED FOR CHEST PAIN EVERY 5 MINUTES UP TO 3 TIMES 25 tablet 0   oxyCODONE-acetaminophen (PERCOCET) 5-325 MG tablet Take 1-2 tablets by mouth every 4 (four) hours as needed for severe pain. 40 tablet 0   OZEMPIC, 2 MG/DOSE, 8 MG/3ML SOPN INJECT 2 MG AS DIRECTED ONCE A WEEK 9 mL 0   triamcinolone (KENALOG) 0.025 % ointment Apply 1 Application topically 2 (two) times daily. Apply to corners of mouth twice daily 30 g 0   No current facility-administered medications on file prior to visit.   Past Medical History:  Diagnosis Date   Allergic rhinitis    Anginal pain (HCC)    Arthritis    Blood transfusion without reported diagnosis    Cellulitis, face 06/08/2022   Complication of anesthesia    COPD (chronic obstructive pulmonary disease) (HCC)    Dental abscess 06/09/2022   GERD (gastroesophageal reflux disease)    History of colon polyps    History of kidney stones    HTN (hypertension)    Hyperlipidemia    Kidney stones    Pancreatitis    Pneumonia    PONV (postoperative nausea and vomiting)    Primary hypertension 10/20/2021   Psychotic depression (HCC)    Shingles February 26th, 2001   Past Surgical History:  Procedure Laterality Date   ABDOMINAL HYSTERECTOMY     BACK SURGERY  02/2023   CARDIAC CATHETERIZATION  2008   CESAREAN SECTION     CHOLECYSTECTOMY     COLONOSCOPY  05/02/2017   Colonic polyp status post polypectomy. Small internal hemorrhoids   FRACTURE SURGERY Bilateral    femur   GALLBLADDER SURGERY     JOINT REPLACEMENT     REPLACEMENT TOTAL KNEE Right 02/09/2019   TOTAL KNEE ARTHROPLASTY   02/09/2019   TUBAL LIGATION      Family History  Problem Relation Age of Onset   COPD Mother    Parkinson's disease Mother    COPD Father    Arthritis Father    Lung disease Father    Colon cancer Cousin        mother's cousin   Breast cancer Maternal Aunt        great aunt   Diabetes Other    Stroke Other    Hypertension Other    Hyperlipidemia Other  Asthma Other    Heart failure Other    Thyroid disease Other    Heart attack Other    COPD Other    Arrhythmia Other    Arthritis Other    Migraines Other    Social History   Socioeconomic History   Marital status: Widowed    Spouse name: Not on file   Number of children: 2   Years of education: Not on file   Highest education level: GED or equivalent  Occupational History   Not on file  Tobacco Use   Smoking status: Every Day    Current packs/day: 1.00    Average packs/day: 1 pack/day for 20.0 years (20.0 ttl pk-yrs)    Types: Cigarettes   Smokeless tobacco: Never  Vaping Use   Vaping status: Never Used  Substance and Sexual Activity   Alcohol use: Yes    Comment: occas   Drug use: No   Sexual activity: Not Currently  Other Topics Concern   Not on file  Social History Narrative   Not on file   Social Determinants of Health   Financial Resource Strain: High Risk (12/15/2022)   Overall Financial Resource Strain (CARDIA)    Difficulty of Paying Living Expenses: Very hard  Food Insecurity: Food Insecurity Present (12/15/2022)   Hunger Vital Sign    Worried About Running Out of Food in the Last Year: Often true    Ran Out of Food in the Last Year: Sometimes true  Transportation Needs: No Transportation Needs (12/15/2022)   PRAPARE - Administrator, Civil Service (Medical): No    Lack of Transportation (Non-Medical): No  Physical Activity: Inactive (04/12/2023)   Exercise Vital Sign    Days of Exercise per Week: 0 days    Minutes of Exercise per Session: 0 min  Stress: Stress Concern Present  (12/15/2022)   [employer] of Occupational Health - Occupational Stress Questionnaire    Feeling of Stress : Rather much  Social Connections: Moderately Isolated (12/15/2022)   Social Connection and Isolation Panel [NHANES]    Frequency of Communication with Friends and Family: More than three times a week    Frequency of Social Gatherings with Friends and Family: Patient declined    Attends Religious Services: More than 4 times per year    Active Member of [employer] or Organizations: No    Attends Banker Meetings: Not on file    Marital Status: Widowed    Objective:  BP 118/70   Pulse 91   Temp (!) 97.2 F (36.2 C)   Ht 5\' 6"  (1.676 m)   Wt 156 lb (70.8 kg)   LMP  (LMP Unknown) Comment: hysterectomy 2008  SpO2 99%   BMI 25.18 kg/m      04/12/2023    7:45 AM 03/02/2023   11:33 AM 03/02/2023    3:21 AM  BP/Weight  Systolic BP 118 108 107  Diastolic BP 70 67 46  Wt. (Lbs) 156    BMI 25.18 kg/m2      Physical Exam Vitals reviewed.  Constitutional:      Appearance: Normal appearance. She is normal weight.  Neck:     Vascular: No carotid bruit.  Cardiovascular:     Rate and Rhythm: Normal rate and regular rhythm.     Heart sounds: Normal heart sounds.  Pulmonary:     Effort: Pulmonary effort is normal. No respiratory distress.     Breath sounds: Normal breath sounds.  Abdominal:  General: Abdomen is flat. Bowel sounds are normal.     Palpations: Abdomen is soft.     Tenderness: There is no abdominal tenderness.  Musculoskeletal:        General: Tenderness (Left trochanteric bursa.) present.  Neurological:     Mental Status: She is alert and oriented to person, place, and time.  Psychiatric:        Mood and Affect: Mood normal.        Behavior: Behavior normal.     Diabetic Foot Exam - Simple   No data filed      Lab Results  Component Value Date   WBC 7.3 02/01/2023   HGB 15.4 02/01/2023   HCT 44 02/01/2023   PLT 297 02/01/2023    GLUCOSE 89 02/21/2023   CHOL 171 12/17/2022   TRIG 157 (H) 12/17/2022   HDL 53 12/17/2022   LDLCALC 91 12/17/2022   ALT 17 12/17/2022   AST 16 12/17/2022   NA 140 02/21/2023   K 3.7 02/21/2023   CL 103 02/21/2023   CREATININE 0.47 02/21/2023   BUN <5 (L) 02/21/2023   CO2 28 02/21/2023   TSH 1.690 09/09/2022   INR 0.8 01/04/2023   HGBA1C 5.4 09/09/2022      Assessment & Plan:    Primary hypertension Assessment & Plan: Well controlled.  No changes to medicines. Continue On amlodipine 10 mg daily, furosemide 20 mg as needed (rarely takes). Continue to work on eating a healthy diet and exercise.  Labs drawn today.    Orders: -     CBC with Differential/Platelet -     Comprehensive metabolic panel -     amLODIPine Besylate; TAKE 1 TABLET BY MOUTH DAILY  Dispense: 90 tablet; Refill: 0  Simple chronic bronchitis (HCC) Assessment & Plan: Continue advair 2 puffs twice daily.    Prediabetes Assessment & Plan: Hemoglobin A1c 5.4, 3 month avg of blood sugars, is in prediabetic range.  In order to prevent progression to diabetes, recommend low carb diet and regular exercise   Orders: -     Hemoglobin A1c  Mixed hyperlipidemia Assessment & Plan: Well controlled.  No changes to medicines. Repatha Continue to work on eating a healthy diet and exercise.  Labs drawn today.    Orders: -     Lipid panel  Gastroesophageal reflux disease without esophagitis Assessment & Plan: Continue omeprazole 40 mg daily.  Continue pepcid 40 mg daily as needed.  Orders: -     Omeprazole; Take 1 capsule (40 mg total) by mouth daily.  Dispense: 90 capsule; Refill: 3  Greater trochanteric bursitis of left hip Assessment & Plan: Initially, patient agreed to a Kenalog shot but subsequently decided she wanted to wait to discuss with her back doctor.  She has a call into them today.   Cigarette nicotine dependence with nicotine-induced disorder Assessment & Plan: Recommended tobacco  cessation.  Cautioned against use of nicotine patches while smoking.  Orders: -     buPROPion HCl ER (XL); Take 1 tablet (300 mg total) by mouth daily.  Dispense: 90 tablet; Refill: 1  Other orders -     Dicyclomine HCl; TAKE 1 CAPSULE(10 MG) BY MOUTH FOUR TIMES DAILY BEFORE MEALS AND AT BEDTIME  Dispense: 120 capsule; Refill: 2     Meds ordered this encounter  Medications   amLODipine (NORVASC) 10 MG tablet    Sig: TAKE 1 TABLET BY MOUTH DAILY    Dispense:  90 tablet    Refill:  0    ZERO refills remain on this prescription. Your patient is requesting advance approval of refills for this medication to PREVENT ANY MISSED DOSES   buPROPion (WELLBUTRIN XL) 300 MG 24 hr tablet    Sig: Take 1 tablet (300 mg total) by mouth daily.    Dispense:  90 tablet    Refill:  1   omeprazole (PRILOSEC) 40 MG capsule    Sig: Take 1 capsule (40 mg total) by mouth daily.    Dispense:  90 capsule    Refill:  3   dicyclomine (BENTYL) 10 MG capsule    Sig: TAKE 1 CAPSULE(10 MG) BY MOUTH FOUR TIMES DAILY BEFORE MEALS AND AT BEDTIME    Dispense:  120 capsule    Refill:  2    Orders Placed This Encounter  Procedures   CBC with Differential/Platelet   Comprehensive metabolic panel   Hemoglobin A1c   Lipid panel     Follow-up: Return in about 3 months (around 07/13/2023) for chronic, fasting.   I,Marla I Leal-Borjas,acting as a scribe for Blane Ohara, MD.,have documented all relevant documentation on the behalf of Blane Ohara, MD,as directed by  Blane Ohara, MD while in the presence of Blane Ohara, MD.   An After Visit Summary was printed and given to the patient.  Blane Ohara, MD [facility] [phone]

## 2023-04-11 NOTE — Assessment & Plan Note (Signed)
Well controlled.  No changes to medicines. Continue On amlodipine 10 mg daily, furosemide 20 mg as needed (rarely takes). Continue to work on eating a healthy diet and exercise.  Labs drawn today.   

## 2023-04-11 NOTE — Assessment & Plan Note (Signed)
Hemoglobin A1c 5.4%, 3 month avg of blood sugars, is in prediabetic range.  In order to prevent progression to diabetes, recommend low carb diet and regular exercise  

## 2023-04-11 NOTE — Assessment & Plan Note (Signed)
Well controlled.  No changes to medicines. Repatha Continue to work on eating a healthy diet and exercise.  Labs drawn today.

## 2023-04-12 ENCOUNTER — Ambulatory Visit: Payer: Medicaid Other | Admitting: Oncology

## 2023-04-12 ENCOUNTER — Other Ambulatory Visit: Payer: Medicaid Other

## 2023-04-12 ENCOUNTER — Encounter: Payer: Self-pay | Admitting: Family Medicine

## 2023-04-12 ENCOUNTER — Ambulatory Visit (INDEPENDENT_AMBULATORY_CARE_PROVIDER_SITE_OTHER): Payer: Medicaid Other | Admitting: Family Medicine

## 2023-04-12 VITALS — BP 118/70 | HR 91 | Temp 97.2°F | Ht 66.0 in | Wt 156.0 lb

## 2023-04-12 DIAGNOSIS — F17219 Nicotine dependence, cigarettes, with unspecified nicotine-induced disorders: Secondary | ICD-10-CM | POA: Diagnosis not present

## 2023-04-12 DIAGNOSIS — E782 Mixed hyperlipidemia: Secondary | ICD-10-CM

## 2023-04-12 DIAGNOSIS — I10 Essential (primary) hypertension: Secondary | ICD-10-CM

## 2023-04-12 DIAGNOSIS — K219 Gastro-esophageal reflux disease without esophagitis: Secondary | ICD-10-CM | POA: Diagnosis not present

## 2023-04-12 DIAGNOSIS — J410 Simple chronic bronchitis: Secondary | ICD-10-CM | POA: Diagnosis not present

## 2023-04-12 DIAGNOSIS — R7303 Prediabetes: Secondary | ICD-10-CM | POA: Diagnosis not present

## 2023-04-12 DIAGNOSIS — M7062 Trochanteric bursitis, left hip: Secondary | ICD-10-CM | POA: Diagnosis not present

## 2023-04-12 MED ORDER — DICYCLOMINE HCL 10 MG PO CAPS: ORAL_CAPSULE | ORAL | 2 refills | Status: DC

## 2023-04-12 MED ORDER — AMLODIPINE BESYLATE 10 MG PO TABS
ORAL_TABLET | ORAL | 0 refills | Status: AC
Start: 2023-04-12 — End: ?

## 2023-04-12 MED ORDER — OMEPRAZOLE 40 MG PO CPDR
40.0000 mg | DELAYED_RELEASE_CAPSULE | Freq: Every day | ORAL | 3 refills | Status: DC
Start: 2023-04-12 — End: 2023-08-22

## 2023-04-12 MED ORDER — BUPROPION HCL ER (XL) 300 MG PO TB24
300.0000 mg | ORAL_TABLET | Freq: Every day | ORAL | 1 refills | Status: DC
Start: 2023-04-12 — End: 2023-05-14

## 2023-04-12 NOTE — Assessment & Plan Note (Signed)
Continue omeprazole 40 mg daily.  Continue pepcid 40 mg daily as needed.

## 2023-04-12 NOTE — Assessment & Plan Note (Signed)
Recommended tobacco cessation.  Cautioned against use of nicotine patches while smoking.

## 2023-04-12 NOTE — Assessment & Plan Note (Signed)
Initially, patient agreed to a Kenalog shot but subsequently decided she wanted to wait to discuss with her back doctor.  She has a call into them today.

## 2023-04-13 LAB — COMPREHENSIVE METABOLIC PANEL
ALT: 20 IU/L (ref 0–32)
AST: 18 IU/L (ref 0–40)
Albumin: 4.4 g/dL (ref 3.8–4.9)
Alkaline Phosphatase: 122 IU/L — ABNORMAL HIGH (ref 44–121)
BUN/Creatinine Ratio: 15 (ref 9–23)
BUN: 8 mg/dL (ref 6–24)
Bilirubin Total: <0.2 mg/dL (ref 0.0–1.2)
CO2: 24 mmol/L (ref 20–29)
Calcium: 10 mg/dL (ref 8.7–10.2)
Chloride: 101 mmol/L (ref 96–106)
Creatinine, Ser: 0.53 mg/dL — ABNORMAL LOW (ref 0.57–1.00)
Globulin, Total: 2.2 g/dL (ref 1.5–4.5)
Glucose: 85 mg/dL (ref 70–99)
Potassium: 5 mmol/L (ref 3.5–5.2)
Sodium: 143 mmol/L (ref 134–144)
Total Protein: 6.6 g/dL (ref 6.0–8.5)
eGFR: 108 mL/min/{1.73_m2} (ref >59)

## 2023-04-13 LAB — HEMOGLOBIN A1C
Est. average glucose Bld gHb Est-mCnc: 103 mg/dL
Hgb A1c MFr Bld: 5.2 % (ref 4.8–5.6)

## 2023-04-13 LAB — LIPID PANEL
Chol/HDL Ratio: 3.9 ratio (ref 0.0–4.4)
Cholesterol, Total: 185 mg/dL (ref 100–199)
HDL: 48 mg/dL (ref >39)
LDL Chol Calc (NIH): 103 mg/dL — ABNORMAL HIGH (ref 0–99)
Triglycerides: 200 mg/dL — ABNORMAL HIGH (ref 0–149)
VLDL Cholesterol Cal: 34 mg/dL (ref 5–40)

## 2023-04-13 LAB — CBC WITH DIFFERENTIAL/PLATELET
Basophils Absolute: 0.1 10*3/uL (ref 0.0–0.2)
Basos: 1 %
EOS (ABSOLUTE): 0.5 10*3/uL — ABNORMAL HIGH (ref 0.0–0.4)
Eos: 7 %
Hematocrit: 43.2 % (ref 34.0–46.6)
Hemoglobin: 14.4 g/dL (ref 11.1–15.9)
Immature Grans (Abs): 0 10*3/uL (ref 0.0–0.1)
Immature Granulocytes: 0 %
Lymphocytes Absolute: 2 10*3/uL (ref 0.7–3.1)
Lymphs: 32 %
MCH: 31.9 pg (ref 26.6–33.0)
MCHC: 33.3 g/dL (ref 31.5–35.7)
MCV: 96 fL (ref 79–97)
Monocytes Absolute: 0.6 10*3/uL (ref 0.1–0.9)
Monocytes: 9 %
Neutrophils Absolute: 3.2 10*3/uL (ref 1.4–7.0)
Neutrophils: 51 %
Platelets: 351 10*3/uL (ref 150–450)
RBC: 4.51 x10E6/uL (ref 3.77–5.28)
RDW: 12.2 % (ref 11.7–15.4)
WBC: 6.3 10*3/uL (ref 3.4–10.8)

## 2023-04-14 ENCOUNTER — Other Ambulatory Visit: Payer: Self-pay

## 2023-04-14 MED ORDER — FENOFIBRATE 160 MG PO TABS: 160.0000 mg | ORAL_TABLET | Freq: Every day | ORAL | 0 refills | Status: DC

## 2023-04-17 ENCOUNTER — Other Ambulatory Visit: Payer: Self-pay | Admitting: Family Medicine

## 2023-04-18 ENCOUNTER — Other Ambulatory Visit: Payer: Self-pay | Admitting: Family Medicine

## 2023-04-18 ENCOUNTER — Telehealth: Payer: Self-pay

## 2023-04-18 MED ORDER — FENOFIBRATE 145 MG PO TABS
145.0000 mg | ORAL_TABLET | Freq: Every day | ORAL | 0 refills | Status: DC
Start: 2023-04-18 — End: 2023-07-16

## 2023-04-18 NOTE — Telephone Encounter (Signed)
On fenofibrate 145 mg daily.Dr. Sedalia Muta

## 2023-04-18 NOTE — Telephone Encounter (Signed)
PA submitted and denied via covermymeds for fenofibrate 160 mg. Patient has to try and fail 2 preferred: Fenofibrate/tricor 48 mg and 145 mg, Lopid/gemfibrozil, generic lovaza or brand name vascepa.

## 2023-04-20 ENCOUNTER — Telehealth: Payer: Self-pay

## 2023-04-20 NOTE — Telephone Encounter (Signed)
Made patient aware Ozempic 2 mg in office for pick up

## 2023-04-20 NOTE — Telephone Encounter (Signed)
Patient picked up the ozempic. Blue paper was put into kims box

## 2023-04-21 ENCOUNTER — Telehealth: Payer: Self-pay

## 2023-04-21 ENCOUNTER — Other Ambulatory Visit: Payer: Self-pay | Admitting: Family Medicine

## 2023-04-21 MED ORDER — FLUCONAZOLE 150 MG PO TABS: 150.0000 mg | ORAL_TABLET | Freq: Once | ORAL | 0 refills | Status: AC

## 2023-04-21 NOTE — Telephone Encounter (Signed)
Patient called requesting Diflucan for a yeast infection she has due to the ABT she just completed written by her dentist.  She uses [facility] on [address]

## 2023-04-28 ENCOUNTER — Ambulatory Visit: Payer: Medicaid Other | Admitting: Podiatry

## 2023-05-05 ENCOUNTER — Other Ambulatory Visit: Payer: Self-pay | Admitting: Family Medicine

## 2023-05-05 DIAGNOSIS — M79641 Pain in right hand: Secondary | ICD-10-CM | POA: Diagnosis not present

## 2023-05-05 DIAGNOSIS — F17219 Nicotine dependence, cigarettes, with unspecified nicotine-induced disorders: Secondary | ICD-10-CM

## 2023-05-05 DIAGNOSIS — M79642 Pain in left hand: Secondary | ICD-10-CM | POA: Diagnosis not present

## 2023-05-06 ENCOUNTER — Ambulatory Visit: Payer: Medicaid Other | Admitting: Podiatry

## 2023-05-06 DIAGNOSIS — G8929 Other chronic pain: Secondary | ICD-10-CM

## 2023-05-06 DIAGNOSIS — M19071 Primary osteoarthritis, right ankle and foot: Secondary | ICD-10-CM

## 2023-05-06 DIAGNOSIS — M25571 Pain in right ankle and joints of right foot: Secondary | ICD-10-CM | POA: Diagnosis not present

## 2023-05-06 DIAGNOSIS — M205X1 Other deformities of toe(s) (acquired), right foot: Secondary | ICD-10-CM

## 2023-05-06 MED ORDER — TRIAMCINOLONE ACETONIDE 10 MG/ML IJ SUSP
10.0000 mg | Freq: Once | INTRAMUSCULAR | Status: AC
Start: 2023-05-06 — End: 2023-05-06
  Administered 2023-05-06: 10 mg via INTRA_ARTICULAR

## 2023-05-06 NOTE — Progress Notes (Signed)
Chief Complaint  Patient presents with   Toe Pain    Requesting steroid injection to the right hallux. She received an injection March 2024 and it helped with the pain.    Ankle Pain    Right ankle pain lateral aspect of ankle. She does not wear an ankle brace.    HPI: 57 y.o. female presents today with return of pain to her right great toe joint.  She was seen by Dr. Annamary Rummage in March 2024 and evaluated for the same condition.  She had received a corticosteroid injection at that time and states that it did provide significant relief for a few months.  As she notes that most of her pain is around the dorsal aspect of the joint.  Denies any injury since last seen.  Also notes some pain along the outer portion of the right foot/ankle.  She describes it as a more generalized type of discomfort to the area.  Denies any ankle sprain.  Denies any bruising to the area.  She does not recall whether she has been walking towards the outside of the foot to avoid putting pressure on the painful great toe joint.  Past Medical History:  Diagnosis Date   Allergic rhinitis    Anginal pain (HCC)    Arthritis    Blood transfusion without reported diagnosis    Cellulitis, face 06/08/2022   Complication of anesthesia    COPD (chronic obstructive pulmonary disease) (HCC)    Dental abscess 06/09/2022   GERD (gastroesophageal reflux disease)    History of colon polyps    History of kidney stones    HTN (hypertension)    Hyperlipidemia    Kidney stones    Pancreatitis    Pneumonia    PONV (postoperative nausea and vomiting)    Primary hypertension 10/20/2021   Psychotic depression (HCC)    Shingles February 26th, 2001    Past Surgical History:  Procedure Laterality Date   ABDOMINAL HYSTERECTOMY     BACK SURGERY  02/2023   CARDIAC CATHETERIZATION  2008   CESAREAN SECTION     CHOLECYSTECTOMY     COLONOSCOPY  05/02/2017   Colonic polyp status post polypectomy. Small internal hemorrhoids    FRACTURE SURGERY Bilateral    femur   GALLBLADDER SURGERY     JOINT REPLACEMENT     REPLACEMENT TOTAL KNEE Right 02/09/2019   TOTAL KNEE ARTHROPLASTY  02/09/2019   TUBAL LIGATION      Allergies  Allergen Reactions   Levofloxacin Other (See Comments) and Rash    Rash all over redness   Bactrim Ds [Sulfamethoxazole-Trimethoprim] Nausea Only   Lipitor [Atorvastatin] Other (See Comments)    Myalgia    Tape Hives   Zetia [Ezetimibe]     Muscle pain   Codeine Nausea Only   Other Rash    bandaids leave a red area if left on too long    Physical Exam: There were no vitals filed for this visit.  General: The patient is alert and oriented x3 in no acute distress.  Dermatology: Skin is warm, dry and supple bilateral lower extremities. Interspaces are clear of maceration and debris.    Vascular: Palpable pedal pulses bilaterally. Capillary refill within normal limits.  No significant edema.  No erythema, ecchymosis or calor.  Neurological: Light touch sensation grossly intact bilateral feet.   Musculoskeletal Exam: Pain on palpation of the dorsal aspect of the right first MPJ.  Pain is localized more along the  medial aspect of the joint, medial to the extensor hallucis longus tendon.  Mild bony prominence palpated on the dorsal aspect of the joint.  No crepitus with range of motion of the joint.  There is pain on range of motion of the joint.  Pain is aggravated with dorsiflexion motion.  Diffuse discomfort on palpation along the anterolateral right ankle and lateral rear foot.  Radiographic Exam:  Patient's right foot x-rays from 12/14/2022 were reviewed today.  There is decreased joint space at the first metatarsal phalangeal joint.  No significant destructive changes are noted in the joint.  A small dorsal spur is noted at the head of the first metatarsal.  Assessment/Plan of Care: 1. Hallux limitus of right foot   2. Osteoarthritis of first metatarsophalangeal (MTP) joint of  right foot   3. Chronic pain of right ankle     Discussed clinical and radiographic findings with patient today.  Informed patient I feel her outer ankle pain is due to avoidance of the painful first MPJ and shifting of her weight towards the outside of the foot and ankle.  We typically see lateral column pain with this condition and is typically from altered gait secondary to pain avoidance.  Recommended the patient wear her elastic ankle sleeve that she noted she has at home for this.  Only wear during waking hours.  With the patient's consent, a corticosteroid injection was administered to the right first MPJ using a dorsal approach.  A sterile skin prep had been performed.  The injection consisted of a mixture of 1% Xylocaine plain, 0.5% Sensorcaine plain, and Kenalog 10 for total of 1.2 cc administered.  She tolerated this well.  A Band-Aid was applied.  She instructed to keep the area covered for 24 hours.  Also discussed shoe gear and the need for shoes with a stiffer forefoot.  We discussed different brands that she could try as well as some sandals that have a slightly stiffer toebox.  Follow-up as needed   Clerance Lav, DPM, FACFAS [facility]     [address] [place], [place] [address]                Office [phone]  Fax [phone]

## 2023-05-09 DIAGNOSIS — R300 Dysuria: Secondary | ICD-10-CM | POA: Diagnosis not present

## 2023-05-09 DIAGNOSIS — R070 Pain in throat: Secondary | ICD-10-CM | POA: Diagnosis not present

## 2023-05-09 DIAGNOSIS — R350 Frequency of micturition: Secondary | ICD-10-CM | POA: Diagnosis not present

## 2023-05-10 ENCOUNTER — Ambulatory Visit: Payer: Medicaid Other | Admitting: Physician Assistant

## 2023-05-10 DIAGNOSIS — R0981 Nasal congestion: Secondary | ICD-10-CM | POA: Diagnosis not present

## 2023-05-10 DIAGNOSIS — R070 Pain in throat: Secondary | ICD-10-CM | POA: Diagnosis not present

## 2023-05-10 DIAGNOSIS — R519 Headache, unspecified: Secondary | ICD-10-CM | POA: Diagnosis not present

## 2023-05-13 ENCOUNTER — Telehealth: Payer: Medicaid Other

## 2023-05-13 VITALS — Ht 66.0 in | Wt 156.0 lb

## 2023-05-13 DIAGNOSIS — H9202 Otalgia, left ear: Secondary | ICD-10-CM | POA: Diagnosis not present

## 2023-05-13 DIAGNOSIS — U071 COVID-19: Secondary | ICD-10-CM

## 2023-05-13 DIAGNOSIS — J4540 Moderate persistent asthma, uncomplicated: Secondary | ICD-10-CM | POA: Diagnosis not present

## 2023-05-13 MED ORDER — AMOXICILLIN-POT CLAVULANATE 875-125 MG PO TABS: 1.0000 | ORAL_TABLET | Freq: Two times a day (BID) | ORAL | 0 refills | Status: AC

## 2023-05-13 NOTE — Assessment & Plan Note (Signed)
[name] reports at least 8 days of symptoms of sore throat, sinus pressure, left ear pain and cough. Tested positive for covid 4 days ago.  Now, sinus pressure and left ear pain are the predominant concerns. With her history of chronic allergic rhinitis, chronic bronchitis and COPD, as she reports increased sputum and purulent nasal drainage, will send her a course of antibiotics.   Advised to try supportive treatment for another 3-4 days and only take antibiotics if absolutely needed at that point.

## 2023-05-13 NOTE — Patient Instructions (Addendum)
Recommend steam, humidifier, zyrtec or claritin etc Use your inhalers and medicines If you do not absolutely feel any better in 3-4 days from now, go ahead and start the antibiotic. Stay well hydrated If any severe symptoms with covid, go to the ED Stay isolated.

## 2023-05-13 NOTE — Progress Notes (Signed)
Virtual Visit via Video Note   This visit type was conducted either per patient request OR due to national recommendations for restrictions regarding the COVID-19 Pandemic (e.g. social distancing) in an effort to limit this patient's exposure and mitigate transmission in our community.  Due to her co-morbid illnesses, this patient is at least at moderate risk for complications without adequate follow up.  This format is felt to be most appropriate for this patient at this time.  All issues noted in this document were discussed and addressed.  A limited physical exam was performed with this format.  A verbal consent was obtained for the virtual visit.   Date:  05/13/2023   ID:  [name], DOB March 27, 1966, MRN [id number]  Patient Location: Home Provider Location: Office/Clinic  PCP:  Blane Ohara, MD   Chief Complaint:  sore throat, covid  History of Present Illness:    [name] is a 57 y.o. female with symptoms of sore throat.  Chief Complaint  Patient presents with   COVID Positive With cough/congestion  .  The patient does have symptoms concerning for COVID-19 infection (fever, chills, cough, or new shortness of breath).   Symptoms started LAST FRIDAY. Went to the walk in clinic on Monday, was given supportive treatment. Tested negative for strep. The next day, she was told she tested positive for covid.  Now, reports pain in left ear, pain in her face.  Coughing some, as the cough medicine seems to be helping. Throat feels better with lidocaine syrup gargles.   Past Medical History:  Diagnosis Date   Allergic rhinitis    Anginal pain (HCC)    Arthritis    Blood transfusion without reported diagnosis    Cellulitis, face 06/08/2022   Complication of anesthesia    COPD (chronic obstructive pulmonary disease) (HCC)    Dental abscess 06/09/2022   GERD (gastroesophageal reflux disease)    History of colon polyps    History of kidney stones    HTN  (hypertension)    Hyperlipidemia    Kidney stones    Pancreatitis    Pneumonia    PONV (postoperative nausea and vomiting)    Primary hypertension 10/20/2021   Psychotic depression (HCC)    Shingles February 26th, 2001    Past Surgical History:  Procedure Laterality Date   ABDOMINAL HYSTERECTOMY     BACK SURGERY  02/2023   CARDIAC CATHETERIZATION  2008   CESAREAN SECTION     CHOLECYSTECTOMY     COLONOSCOPY  05/02/2017   Colonic polyp status post polypectomy. Small internal hemorrhoids   FRACTURE SURGERY Bilateral    [facility]   GALLBLADDER SURGERY     JOINT REPLACEMENT     REPLACEMENT TOTAL KNEE Right 02/09/2019   TOTAL KNEE ARTHROPLASTY  02/09/2019   TUBAL LIGATION      Family History  Problem Relation Age of Onset   COPD Mother    Parkinson's disease Mother    COPD Father    Arthritis Father    Lung disease Father    Colon cancer Cousin        mother's cousin   Breast cancer Maternal Aunt        great aunt   Diabetes Other    Stroke Other    Hypertension Other    Hyperlipidemia Other    Asthma Other    Heart failure Other    Thyroid disease Other    Heart attack Other    COPD Other  Arrhythmia Other    Arthritis Other    Migraines Other     Social History   Socioeconomic History   Marital status: Widowed    Spouse name: Not on file   Number of children: 2   Years of education: Not on file   Highest education level: GED or equivalent  Occupational History   Not on file  Tobacco Use   Smoking status: Every Day    Current packs/day: 1.00    Average packs/day: 1 pack/day for 20.0 years (20.0 ttl pk-yrs)    Types: Cigarettes   Smokeless tobacco: Never  Vaping Use   Vaping status: Never Used  Substance and Sexual Activity   Alcohol use: Yes    Comment: occas   Drug use: No   Sexual activity: Not Currently  Other Topics Concern   Not on file  Social History Narrative   Not on file   Social Determinants of Health   Financial Resource Strain:  High Risk (12/15/2022)   Overall Financial Resource Strain (CARDIA)    Difficulty of Paying Living Expenses: Very hard  Food Insecurity: Food Insecurity Present (12/15/2022)   Hunger Vital Sign    Worried About Running Out of Food in the Last Year: Often true    Ran Out of Food in the Last Year: Sometimes true  Transportation Needs: No Transportation Needs (12/15/2022)   PRAPARE - Administrator, Civil Service (Medical): No    Lack of Transportation (Non-Medical): No  Physical Activity: Inactive (04/12/2023)   Exercise Vital Sign    Days of Exercise per Week: 0 days    Minutes of Exercise per Session: 0 min  Stress: Stress Concern Present (12/15/2022)   [employer] of Occupational Health - Occupational Stress Questionnaire    Feeling of Stress : Rather much  Social Connections: Moderately Isolated (12/15/2022)   Social Connection and Isolation Panel [NHANES]    Frequency of Communication with Friends and Family: More than three times a week    Frequency of Social Gatherings with Friends and Family: Patient declined    Attends Religious Services: More than 4 times per year    Active Member of [employer] or Organizations: No    Attends Banker Meetings: Not on file    Marital Status: Widowed  Intimate Partner Violence: Not At Risk (04/12/2023)   Humiliation, Afraid, Rape, and Kick questionnaire    Fear of Current or Ex-Partner: No    Emotionally Abused: No    Physically Abused: No    Sexually Abused: No    Outpatient Medications Prior to Visit  Medication Sig Dispense Refill   acetaminophen (TYLENOL) 650 MG CR tablet Take 650-1,300 mg by mouth every 8 (eight) hours as needed for pain.     albuterol (VENTOLIN HFA) 108 (90 Base) MCG/ACT inhaler INHALE 2 PUFFS INTO LUNGS EVERY 6 TO 8 HOURS AS NEEDED FOR WHEEZING 18 g 1   ALPRAZolam (XANAX) 0.25 MG tablet TAKE 1 TABLET(0.25 MG) BY MOUTH DAILY AS NEEDED FOR ANXIETY 30 tablet 3   amLODipine (NORVASC) 10 MG tablet TAKE  1 TABLET BY MOUTH DAILY 90 tablet 0   buPROPion (WELLBUTRIN XL) 300 MG 24 hr tablet Take 1 tablet (300 mg total) by mouth daily. 90 tablet 1   cyclobenzaprine (FLEXERIL) 10 MG tablet Take 1 tablet (10 mg total) by mouth 3 (three) times daily as needed for muscle spasms. 270 tablet 0   dicyclomine (BENTYL) 10 MG capsule TAKE 1 CAPSULE(10 MG) BY MOUTH  FOUR TIMES DAILY BEFORE MEALS AND AT BEDTIME 120 capsule 2   Docusate Sodium (DSS) 100 MG CAPS Take 100 mg by mouth daily as needed (constipation).     Evolocumab (REPATHA SURECLICK) 140 MG/ML SOAJ Inject 140 mg as directed every 14 (fourteen) days. 2 mL 2   famotidine (PEPCID) 40 MG tablet Take 1 tablet (40 mg total) by mouth daily as needed for heartburn or indigestion. 90 tablet 1   fenofibrate (TRICOR) 145 MG tablet Take 1 tablet (145 mg total) by mouth daily. 90 tablet 0   fluticasone (FLONASE) 50 MCG/ACT nasal spray SHAKE LIQUID AND USE 2 SPRAYS IN EACH NOSTRIL DAILY 16 g 6   fluticasone-salmeterol (ADVAIR HFA) 115-21 MCG/ACT inhaler Inhale 2 puffs into the lungs 2 (two) times daily. (Patient taking differently: Inhale 2 puffs into the lungs 2 (two) times daily as needed (asthma).) 3 each 3   furosemide (LASIX) 20 MG tablet Take 1 tablet (20 mg total) by mouth daily as needed for edema. 1 tablet 0   montelukast (SINGULAIR) 10 MG tablet TAKE 1 TABLET(10 MG) BY MOUTH AT BEDTIME (Patient taking differently: Take 10 mg by mouth every Monday, Wednesday, and Friday.) 90 tablet 0   nicotine (NICODERM CQ - DOSED IN MG/24 HOURS) 21 mg/24hr patch APPLY 1 PATCH(21 MG) TOPICALLY TO THE SKIN DAILY 28 patch 0   nitroGLYCERIN (NITROSTAT) 0.4 MG SL tablet DISSOLVE 1 TABLET BY MOUTH UNDER THE TONGUE AS NEEDED FOR CHEST PAIN EVERY 5 MINUTES UP TO 3 TIMES 25 tablet 0   omeprazole (PRILOSEC) 40 MG capsule Take 1 capsule (40 mg total) by mouth daily. 90 capsule 3   oxyCODONE-acetaminophen (PERCOCET) 5-325 MG tablet Take 1-2 tablets by mouth every 4 (four) hours as needed  for severe pain. 40 tablet 0   OZEMPIC, 2 MG/DOSE, 8 MG/3ML SOPN INJECT 2 MG AS DIRECTED ONCE A WEEK 9 mL 0   triamcinolone (KENALOG) 0.025 % ointment Apply 1 Application topically 2 (two) times daily. Apply to corners of mouth twice daily 30 g 0   lubiprostone (AMITIZA) 8 MCG capsule TAKE 1 CAPSULE(8 MCG) BY MOUTH TWICE DAILY WITH A MEAL 60 capsule 1   No facility-administered medications prior to visit.    Allergies  Allergen Reactions   Levofloxacin Other (See Comments) and Rash    Rash all over redness   Bactrim Ds [Sulfamethoxazole-Trimethoprim] Nausea Only   Lipitor [Atorvastatin] Other (See Comments)    Myalgia    Tape Hives   Zetia [Ezetimibe]     Muscle pain   Codeine Nausea Only   Other Rash    bandaids leave a red area if left on too long     Social History   Tobacco Use   Smoking status: Every Day    Current packs/day: 1.00    Average packs/day: 1 pack/day for 20.0 years (20.0 ttl pk-yrs)    Types: Cigarettes   Smokeless tobacco: Never  Vaping Use   Vaping status: Never Used  Substance Use Topics   Alcohol use: Yes    Comment: occas   Drug use: No     Review of Systems  Constitutional:  Positive for malaise/fatigue. Negative for fever.  HENT:  Positive for congestion, ear pain (Left ear), sinus pain and sore throat.   Respiratory:  Positive for cough. Negative for shortness of breath and wheezing.   Cardiovascular:  Negative for chest pain.  Gastrointestinal:  Negative for abdominal pain.  Genitourinary:  Negative for dysuria.  Musculoskeletal:  Negative for  joint pain and myalgias.  Neurological:  Positive for headaches. Negative for dizziness and weakness.     Labs/Other Tests and Data Reviewed:    Recent Labs: 09/09/2022: TSH 1.690 04/12/2023: ALT 20; BUN 8; Creatinine, Ser 0.53; Hemoglobin 14.4; Platelets 351; Potassium 5.0; Sodium 143   Recent Lipid Panel Lab Results  Component Value Date/Time   CHOL 185 04/12/2023 08:07 AM   TRIG 200 (H)  04/12/2023 08:07 AM   HDL 48 04/12/2023 08:07 AM   CHOLHDL 3.9 04/12/2023 08:07 AM   LDLCALC 103 (H) 04/12/2023 08:07 AM    Wt Readings from Last 3 Encounters:  05/13/23 156 lb (70.8 kg)  04/12/23 156 lb (70.8 kg)  02/28/23 160 lb (72.6 kg)     Objective:    Vital Signs:  Ht 5\' 6"  (1.676 m)   Wt 156 lb (70.8 kg)   LMP  (LMP Unknown) Comment: hysterectomy 2008  BMI 25.18 kg/m    Physical Exam Vitals (does not appear to be in distress) and nursing note reviewed.  Constitutional:      Appearance: Normal appearance.  Neurological:     Mental Status: She is alert.      ASSESSMENT & PLAN:   COVID-19 virus infection Assessment & Plan: Emilyah reports at least 8 days of symptoms of sore throat, sinus pressure, left ear pain and cough. Tested positive for covid 4 days ago.  Now, sinus pressure and left ear pain are the predominant concerns. With her history of chronic allergic rhinitis, chronic bronchitis and COPD, as she reports increased sputum and purulent nasal drainage, will send her a course of antibiotics.   Advised to try supportive treatment for another 3-4 days and only take antibiotics if absolutely needed at that point.     Moderate persistent asthma without complication/COPD  Left ear pain  Other orders -     Amoxicillin-Pot Clavulanate; Take 1 tablet by mouth 2 (two) times daily for 7 days.  Dispense: 14 tablet; Refill: 0     No orders of the defined types were placed in this encounter.    Meds ordered this encounter  Medications   amoxicillin-clavulanate (AUGMENTIN) 875-125 MG tablet    Sig: Take 1 tablet by mouth 2 (two) times daily for 7 days.    Dispense:  14 tablet    Refill:  0    I spent < 8 > minutes dedicated to the care of this patient on the date of this encounter to include face-to-face time with the patient. Total 18 minutes spent co-ordinating care, sending her medicine to the pharmacy and documentation.   Follow Up:  In Person  prn  Signed, Windell Moment, MD  05/13/2023 9:46 AM    [facility]

## 2023-05-14 ENCOUNTER — Other Ambulatory Visit: Payer: Self-pay | Admitting: Family Medicine

## 2023-05-14 DIAGNOSIS — F17219 Nicotine dependence, cigarettes, with unspecified nicotine-induced disorders: Secondary | ICD-10-CM

## 2023-05-30 ENCOUNTER — Other Ambulatory Visit: Payer: Self-pay | Admitting: Family Medicine

## 2023-05-30 DIAGNOSIS — F17219 Nicotine dependence, cigarettes, with unspecified nicotine-induced disorders: Secondary | ICD-10-CM

## 2023-05-30 DIAGNOSIS — J309 Allergic rhinitis, unspecified: Secondary | ICD-10-CM

## 2023-05-30 MED ORDER — MONTELUKAST SODIUM 10 MG PO TABS
10.0000 mg | ORAL_TABLET | Freq: Every day | ORAL | 0 refills | Status: DC
Start: 2023-05-30 — End: 2023-08-26

## 2023-06-01 ENCOUNTER — Telehealth: Payer: Medicaid Other | Admitting: Physician Assistant

## 2023-06-01 ENCOUNTER — Telehealth: Payer: Self-pay

## 2023-06-01 DIAGNOSIS — B9689 Other specified bacterial agents as the cause of diseases classified elsewhere: Secondary | ICD-10-CM | POA: Diagnosis not present

## 2023-06-01 DIAGNOSIS — J019 Acute sinusitis, unspecified: Secondary | ICD-10-CM | POA: Diagnosis not present

## 2023-06-01 MED ORDER — AMOXICILLIN-POT CLAVULANATE 875-125 MG PO TABS
1.0000 | ORAL_TABLET | Freq: Two times a day (BID) | ORAL | 0 refills | Status: DC
Start: 2023-06-01 — End: 2023-06-16

## 2023-06-01 NOTE — Telephone Encounter (Signed)
Pt called today to request a same day appointment for the following symptoms:SINUS CONGESTION, RIGHT EAR PAIN . Unfortunately, our schedule is full and we have no openings today. Patient notified that she can go a E-visit with a [facility] provider through her mychart. The patient stated that she will try this.

## 2023-06-01 NOTE — Progress Notes (Signed)
I have spent 5 minutes in review of e-visit questionnaire, review and updating patient chart, medical decision making and response to patient.   William Cody Martin, PA-C    

## 2023-06-01 NOTE — Progress Notes (Signed)

## 2023-06-02 DIAGNOSIS — M1811 Unilateral primary osteoarthritis of first carpometacarpal joint, right hand: Secondary | ICD-10-CM | POA: Diagnosis not present

## 2023-06-02 DIAGNOSIS — M25532 Pain in left wrist: Secondary | ICD-10-CM | POA: Insufficient documentation

## 2023-06-10 ENCOUNTER — Other Ambulatory Visit: Payer: Self-pay

## 2023-06-10 ENCOUNTER — Encounter: Payer: Self-pay | Admitting: Family Medicine

## 2023-06-10 DIAGNOSIS — Z1211 Encounter for screening for malignant neoplasm of colon: Secondary | ICD-10-CM

## 2023-06-14 DIAGNOSIS — M5136 Other intervertebral disc degeneration, lumbar region: Secondary | ICD-10-CM | POA: Diagnosis not present

## 2023-06-15 ENCOUNTER — Telehealth: Payer: Medicaid Other

## 2023-06-15 ENCOUNTER — Telehealth: Payer: Self-pay

## 2023-06-15 DIAGNOSIS — R0981 Nasal congestion: Secondary | ICD-10-CM

## 2023-06-15 NOTE — Telephone Encounter (Signed)
The patient called back this afternoon to follow-up on her call from this morning. Patient was notified that we are waiting for a response from the provider.

## 2023-06-15 NOTE — Telephone Encounter (Signed)
Patient called and stated that she is still having sinus pain/left ear pain/congestion. She states that she finished her round of antibiotics that the My Chart Doctor gave her from her e-visit with them which was 06/01/23. She states that she is requesting another round of antibiotics to be sent to the pharmacy. Please advise.

## 2023-06-16 ENCOUNTER — Other Ambulatory Visit: Payer: Self-pay | Admitting: Family Medicine

## 2023-06-16 ENCOUNTER — Encounter: Payer: Self-pay | Admitting: Family Medicine

## 2023-06-16 ENCOUNTER — Ambulatory Visit: Payer: Medicaid Other | Admitting: Family Medicine

## 2023-06-16 VITALS — BP 120/78 | HR 96 | Temp 97.0°F | Ht 66.0 in | Wt 160.0 lb

## 2023-06-16 DIAGNOSIS — J441 Chronic obstructive pulmonary disease with (acute) exacerbation: Secondary | ICD-10-CM

## 2023-06-16 DIAGNOSIS — J301 Allergic rhinitis due to pollen: Secondary | ICD-10-CM

## 2023-06-16 DIAGNOSIS — J0111 Acute recurrent frontal sinusitis: Secondary | ICD-10-CM | POA: Diagnosis not present

## 2023-06-16 MED ORDER — LEVOCETIRIZINE DIHYDROCHLORIDE 5 MG PO TABS
5.0000 mg | ORAL_TABLET | Freq: Every evening | ORAL | 0 refills | Status: DC
Start: 2023-06-16 — End: 2023-06-16

## 2023-06-16 MED ORDER — AMOXICILLIN-POT CLAVULANATE 875-125 MG PO TABS
1.0000 | ORAL_TABLET | Freq: Two times a day (BID) | ORAL | 0 refills | Status: DC
Start: 2023-06-16 — End: 2023-07-20

## 2023-06-16 MED ORDER — DEXTROMETHORPHAN HBR 15 MG/5ML PO SYRP
10.0000 mL | ORAL_SOLUTION | Freq: Four times a day (QID) | ORAL | 0 refills | Status: DC | PRN
Start: 2023-06-16 — End: 2023-08-05

## 2023-06-16 MED ORDER — BENZONATATE 200 MG PO CAPS
200.0000 mg | ORAL_CAPSULE | Freq: Three times a day (TID) | ORAL | 0 refills | Status: DC | PRN
Start: 2023-06-16 — End: 2023-07-20

## 2023-06-16 NOTE — Assessment & Plan Note (Addendum)
Acute - recurrent -symptoms on exam consistent with sinus infection (frontal) -no evidence of AOM Encouraged patient to increase fluids and rest, take Vitamin C and antihistamine (Zyzal) Also recommended Suja tumeric/ginger immunity shot Humidification or warm washcloth 3-4 times a day to forehead as tolerated Augmentin 875-125 mg by mouth TWICE A DAY for 10 days

## 2023-06-16 NOTE — Assessment & Plan Note (Signed)
Acute Encouraged patient to stop smoking at least during this acute phase of this infection Tessalon pearls 200 mg by mouth TWICE A DAY as needed for cough Dextromethorphan 10 ml by mouth four times a day as needed for cough Advair daily and albuterol as needed

## 2023-06-16 NOTE — Progress Notes (Signed)
Acute Office Visit  Subjective:    Patient ID: [name], female    DOB: 1966/06/14, 57 y.o.   MRN: [id number]  Chief Complaint  Patient presents with   URI    HPI: Patient is in today for URI sympmtoms. Has completed a round of amoxicillin from her 9/11 evisit. Has been taking alka-seltzer and Tylenol. C/o sore throat, headache and left ear pain. She has been taking care of her mom and hasn't had much time for herself. She takes Singulair and Flonase daily for allergy. Denies shortness of breath or chest pain. Patient states that she has recurrent sinus infections and had COVID in August as well. Patient complains of increased pressure and pain when bending over. She also states that her coughing is worse at night and has been having trouble sleeping.   Past Medical History:  Diagnosis Date   Allergic rhinitis    Anginal pain (HCC)    Arthritis    Blood transfusion without reported diagnosis    Cellulitis, face 06/08/2022   Complication of anesthesia    COPD (chronic obstructive pulmonary disease) (HCC)    Dental abscess 06/09/2022   GERD (gastroesophageal reflux disease)    History of colon polyps    History of kidney stones    HTN (hypertension)    Hyperlipidemia    Kidney stones    Pancreatitis    Pneumonia    PONV (postoperative nausea and vomiting)    Primary hypertension 10/20/2021   Psychotic depression (HCC)    Shingles February 26th, 2001    Past Surgical History:  Procedure Laterality Date   ABDOMINAL HYSTERECTOMY     BACK SURGERY  02/2023   CARDIAC CATHETERIZATION  2008   CESAREAN SECTION     CHOLECYSTECTOMY     COLONOSCOPY  05/02/2017   Colonic polyp status post polypectomy. Small internal hemorrhoids   FRACTURE SURGERY Bilateral    [facility]   GALLBLADDER SURGERY     JOINT REPLACEMENT     REPLACEMENT TOTAL KNEE Right 02/09/2019   TOTAL KNEE ARTHROPLASTY  02/09/2019   TUBAL LIGATION      Family History  Problem Relation Age of Onset   COPD  Mother    Parkinson's disease Mother    COPD Father    Arthritis Father    Lung disease Father    Colon cancer Cousin        mother's cousin   Breast cancer Maternal Aunt        great aunt   Diabetes Other    Stroke Other    Hypertension Other    Hyperlipidemia Other    Asthma Other    Heart failure Other    Thyroid disease Other    Heart attack Other    COPD Other    Arrhythmia Other    Arthritis Other    Migraines Other     Social History   Socioeconomic History   Marital status: Widowed    Spouse name: Not on file   Number of children: 2   Years of education: Not on file   Highest education level: GED or equivalent  Occupational History   Not on file  Tobacco Use   Smoking status: Every Day    Current packs/day: 1.00    Average packs/day: 1 pack/day for 20.0 years (20.0 ttl pk-yrs)    Types: Cigarettes   Smokeless tobacco: Never  Vaping Use   Vaping status: Never Used  Substance and Sexual Activity   Alcohol use:  Yes    Comment: occas   Drug use: No   Sexual activity: Not Currently  Other Topics Concern   Not on file  Social History Narrative   Not on file   Social Determinants of Health   Financial Resource Strain: High Risk (12/15/2022)   Overall Financial Resource Strain (CARDIA)    Difficulty of Paying Living Expenses: Very hard  Food Insecurity: Food Insecurity Present (12/15/2022)   Hunger Vital Sign    Worried About Running Out of Food in the Last Year: Often true    Ran Out of Food in the Last Year: Sometimes true  Transportation Needs: No Transportation Needs (12/15/2022)   PRAPARE - Administrator, Civil Service (Medical): No    Lack of Transportation (Non-Medical): No  Physical Activity: Inactive (04/12/2023)   Exercise Vital Sign    Days of Exercise per Week: 0 days    Minutes of Exercise per Session: 0 min  Stress: Stress Concern Present (12/15/2022)   [employer] of Occupational Health - Occupational Stress  Questionnaire    Feeling of Stress : Rather much  Social Connections: Moderately Isolated (12/15/2022)   Social Connection and Isolation Panel [NHANES]    Frequency of Communication with Friends and Family: More than three times a week    Frequency of Social Gatherings with Friends and Family: Patient declined    Attends Religious Services: More than 4 times per year    Active Member of [employer] or Organizations: No    Attends Banker Meetings: Not on file    Marital Status: Widowed  Intimate Partner Violence: Not At Risk (04/12/2023)   Humiliation, Afraid, Rape, and Kick questionnaire    Fear of Current or Ex-Partner: No    Emotionally Abused: No    Physically Abused: No    Sexually Abused: No    Outpatient Medications Prior to Visit  Medication Sig Dispense Refill   acetaminophen (TYLENOL) 650 MG CR tablet Take 650-1,300 mg by mouth every 8 (eight) hours as needed for pain.     albuterol (VENTOLIN HFA) 108 (90 Base) MCG/ACT inhaler INHALE 2 PUFFS INTO LUNGS EVERY 6 TO 8 HOURS AS NEEDED FOR WHEEZING 18 g 1   ALPRAZolam (XANAX) 0.25 MG tablet TAKE 1 TABLET(0.25 MG) BY MOUTH DAILY AS NEEDED FOR ANXIETY 30 tablet 3   amLODipine (NORVASC) 10 MG tablet TAKE 1 TABLET BY MOUTH DAILY 90 tablet 0   buPROPion (WELLBUTRIN XL) 300 MG 24 hr tablet TAKE 1 TABLET(300 MG) BY MOUTH DAILY. 90 tablet 1   cyclobenzaprine (FLEXERIL) 10 MG tablet Take 1 tablet (10 mg total) by mouth 3 (three) times daily as needed for muscle spasms. 270 tablet 0   dicyclomine (BENTYL) 10 MG capsule TAKE 1 CAPSULE(10 MG) BY MOUTH FOUR TIMES DAILY BEFORE MEALS AND AT BEDTIME 120 capsule 2   Docusate Sodium (DSS) 100 MG CAPS Take 100 mg by mouth daily as needed (constipation).     Evolocumab (REPATHA SURECLICK) 140 MG/ML SOAJ Inject 140 mg as directed every 14 (fourteen) days. 2 mL 2   famotidine (PEPCID) 40 MG tablet Take 1 tablet (40 mg total) by mouth daily as needed for heartburn or indigestion. 90 tablet 1    fenofibrate (TRICOR) 145 MG tablet Take 1 tablet (145 mg total) by mouth daily. 90 tablet 0   fluticasone (FLONASE) 50 MCG/ACT nasal spray SHAKE LIQUID AND USE 2 SPRAYS IN EACH NOSTRIL DAILY 16 g 6   fluticasone-salmeterol (ADVAIR HFA) 115-21 MCG/ACT inhaler  Inhale 2 puffs into the lungs 2 (two) times daily. (Patient taking differently: Inhale 2 puffs into the lungs 2 (two) times daily as needed (asthma).) 3 each 3   furosemide (LASIX) 20 MG tablet Take 1 tablet (20 mg total) by mouth daily as needed for edema. 1 tablet 0   montelukast (SINGULAIR) 10 MG tablet Take 1 tablet (10 mg total) by mouth at bedtime. TAKE 1 TABLET(10 MG) BY MOUTH AT BEDTIME Strength: 10 mg 90 tablet 0   nicotine (NICODERM CQ - DOSED IN MG/24 HOURS) 21 mg/24hr patch APPLY 1 PATCH(21 MG) TOPICALLY TO THE SKIN DAILY 28 patch 0   nitroGLYCERIN (NITROSTAT) 0.4 MG SL tablet DISSOLVE 1 TABLET BY MOUTH UNDER THE TONGUE AS NEEDED FOR CHEST PAIN EVERY 5 MINUTES UP TO 3 TIMES 25 tablet 0   omeprazole (PRILOSEC) 40 MG capsule Take 1 capsule (40 mg total) by mouth daily. 90 capsule 3   OZEMPIC, 2 MG/DOSE, 8 MG/3ML SOPN INJECT 2 MG AS DIRECTED ONCE A WEEK 9 mL 0   triamcinolone (KENALOG) 0.025 % ointment Apply 1 Application topically 2 (two) times daily. Apply to corners of mouth twice daily 30 g 0   lubiprostone (AMITIZA) 8 MCG capsule TAKE 1 CAPSULE(8 MCG) BY MOUTH TWICE DAILY WITH A MEAL 60 capsule 1   oxyCODONE-acetaminophen (PERCOCET) 5-325 MG tablet Take 1-2 tablets by mouth every 4 (four) hours as needed for severe pain. 40 tablet 0   No facility-administered medications prior to visit.    Allergies  Allergen Reactions   Levofloxacin Other (See Comments) and Rash    Rash all over redness   Bactrim Ds [Sulfamethoxazole-Trimethoprim] Nausea Only   Lipitor [Atorvastatin] Other (See Comments)    Myalgia    Tape Hives   Zetia [Ezetimibe]     Muscle pain   Codeine Nausea Only   Other Rash    bandaids leave a red area if left  on too long    Review of Systems  Constitutional:  Negative for chills, fatigue and fever.  HENT:  Positive for congestion, ear pain (left), postnasal drip, sinus pressure, sinus pain and sore throat. Negative for rhinorrhea.   Respiratory:  Positive for cough (productive). Negative for shortness of breath.   Cardiovascular:  Negative for chest pain.  Gastrointestinal:  Negative for abdominal pain, diarrhea, nausea and vomiting.  Genitourinary: Negative.   Neurological:  Positive for headaches. Negative for dizziness.       Objective:        06/16/2023    1:24 PM 05/13/2023    9:19 AM 04/12/2023    7:45 AM  Vitals with BMI  Height 5\' 6"  5\' 6"  5\' 6"   Weight 160 lbs 156 lbs 156 lbs  BMI 25.84 25.19 25.19  Systolic 120  118  Diastolic 78  70  Pulse 96  91     Physical Exam Vitals reviewed.  Constitutional:      General: She is not in acute distress.    Appearance: She is ill-appearing.  HENT:     Right Ear: Tympanic membrane normal. Tympanic membrane is not erythematous.     Left Ear: Tenderness present. Tympanic membrane is bulging. Tympanic membrane is not erythematous.     Nose:     Right Turbinates: Pale.     Left Turbinates: Pale.     Right Sinus: Maxillary sinus tenderness and frontal sinus tenderness present.     Left Sinus: Maxillary sinus tenderness and frontal sinus tenderness present.     Mouth/Throat:  Pharynx: Oropharynx is clear. Posterior oropharyngeal erythema (dextrometh) present.  Cardiovascular:     Rate and Rhythm: Normal rate and regular rhythm.     Heart sounds: Normal heart sounds.  Pulmonary:     Effort: Pulmonary effort is normal.     Breath sounds: Rhonchi present.  Skin:    General: Skin is warm.  Neurological:     Mental Status: She is alert. Mental status is at baseline.  Psychiatric:        Mood and Affect: Mood normal.     Health Maintenance Due  Topic Date Due   Cervical Cancer Screening (HPV/Pap Cotest)  04/05/2017   Lung  Cancer Screening  12/31/2022   INFLUENZA VACCINE  04/21/2023    There are no preventive care reminders to display for this patient.   Lab Results  Component Value Date   TSH 1.690 09/09/2022   Lab Results  Component Value Date   WBC 6.3 04/12/2023   HGB 14.4 04/12/2023   HCT 43.2 04/12/2023   MCV 96 04/12/2023   PLT 351 04/12/2023   Lab Results  Component Value Date   NA 143 04/12/2023   K 5.0 04/12/2023   CO2 24 04/12/2023   GLUCOSE 85 04/12/2023   BUN 8 04/12/2023   CREATININE 0.53 (L) 04/12/2023   BILITOT <0.2 04/12/2023   ALKPHOS 122 (H) 04/12/2023   AST 18 04/12/2023   ALT 20 04/12/2023   PROT 6.6 04/12/2023   ALBUMIN 4.4 04/12/2023   CALCIUM 10.0 04/12/2023   ANIONGAP 9 02/21/2023   EGFR 108 04/12/2023   Lab Results  Component Value Date   CHOL 185 04/12/2023   Lab Results  Component Value Date   HDL 48 04/12/2023   Lab Results  Component Value Date   LDLCALC 103 (H) 04/12/2023   Lab Results  Component Value Date   TRIG 200 (H) 04/12/2023   Lab Results  Component Value Date   CHOLHDL 3.9 04/12/2023   Lab Results  Component Value Date   HGBA1C 5.2 04/12/2023       Assessment & Plan:  Acute recurrent frontal sinusitis Assessment & Plan: Acute - recurrent -symptoms on exam consistent with sinus infection (frontal) -no evidence of AOM Encouraged patient to increase fluids and rest, take Vitamin C and antihistamine (Zyzal) Also recommended Suja tumeric/ginger immunity shot Humidification or warm washcloth 3-4 times a day to forehead as tolerated Augmentin 875-125 mg by mouth TWICE A DAY for 10 days   Orders: -     Amoxicillin-Pot Clavulanate; Take 1 tablet by mouth 2 (two) times daily.  Dispense: 20 tablet; Refill: 0  COPD exacerbation (HCC) Assessment & Plan: Acute Encouraged patient to stop smoking at least during this acute phase of this infection Tessalon pearls 200 mg by mouth TWICE A DAY as needed for cough Dextromethorphan 10  ml by mouth four times a day as needed for cough Advair daily and albuterol as needed  Orders: -     Dextromethorphan HBr; Take 10 mLs (30 mg total) by mouth 4 (four) times daily as needed for cough.  Dispense: 120 mL; Refill: 0 -     Benzonatate; Take 1 capsule (200 mg total) by mouth 3 (three) times daily as needed for cough.  Dispense: 30 capsule; Refill: 0  Seasonal allergic rhinitis due to pollen -     Levocetirizine Dihydrochloride; Take 1 tablet (5 mg total) by mouth every evening.  Dispense: 30 tablet; Refill: 0     Meds ordered this encounter  Medications   amoxicillin-clavulanate (AUGMENTIN) 875-125 MG tablet    Sig: Take 1 tablet by mouth 2 (two) times daily.    Dispense:  20 tablet    Refill:  0   levocetirizine (XYZAL) 5 MG tablet    Sig: Take 1 tablet (5 mg total) by mouth every evening.    Dispense:  30 tablet    Refill:  0   dextromethorphan 15 MG/5ML syrup    Sig: Take 10 mLs (30 mg total) by mouth 4 (four) times daily as needed for cough.    Dispense:  120 mL    Refill:  0   benzonatate (TESSALON) 200 MG capsule    Sig: Take 1 capsule (200 mg total) by mouth 3 (three) times daily as needed for cough.    Dispense:  30 capsule    Refill:  0    No orders of the defined types were placed in this encounter.    Follow-up: Return if symptoms worsen or fail to improve.  An After Visit Summary was printed and given to the patient.  Total time spent on today's visit was greater than 30 minutes, including both face-to-face time and nonface-to-face time personally spent on review of chart (labs and imaging), discussing labs and goals, discussing further work-up, treatment options, referrals to specialist if needed, reviewing outside records if pertinent, answering patient's questions, and coordinating care.   Lajuana Matte, FNP [facility] [phone]

## 2023-06-16 NOTE — Progress Notes (Signed)
Because you have failed a first-line treatment regimen, I feel your condition warrants further evaluation and I recommend that you be seen for a face to face visit.  Please contact your primary care physician practice to be seen. Many offices offer virtual options to be seen via video if you are not comfortable going in person to a medical facility at this time.  NOTE: You will NOT be charged for this eVisit.  If you do not have a PCP, [facility] offers a free physician referral service available at [phone]. Our trained staff has the experience, knowledge and resources to put you in touch with a physician who is right for you.   If you are having a true medical emergency please call 911.      For an urgent face to face visit, [facility] has eight urgent care centers for your convenience:   NEW!! [facility] at [facility] Get Driving Directions [phone] [address] [place], [address]    [facility] Health Urgent Care Center at [facility] Get Driving Directions [phone] [address] [place], [place] [address]   [facility] Health Urgent Care Center [facility]) Get Driving Directions [phone] [address] [place], [place] [address]  [facility]gent Care Center [facility]) Get Driving Directions [phone] [address] [place],  [place]  [address]  [facility] Care Center [facility] - at [employer]  [phone] [place],  [place] [address]   [facility] Health Urgent Care at [facility] Get Driving Directions [phone] [address] [place], [place] [address]   [facility] Health Urgent Care at [facility] Get Driving Directions  [phone] [address] [place], [place] [address]   [facility] Health Urgent Care at [facility] Directions [phone] [address]  [address] [place], [place] [address]  Your MyChart E-visit questionnaire answers were reviewed by a board certified advanced clinical practitioner to complete your personal care plan based on your specific symptoms.  Thank you for using e-Visits.   I have spent 5 minutes in review of e-visit questionnaire, review and updating patient chart, medical decision making and response to patient.   Margaretann Loveless, PA-C

## 2023-06-16 NOTE — Telephone Encounter (Signed)
Appt scheduled

## 2023-06-27 DIAGNOSIS — E785 Hyperlipidemia, unspecified: Secondary | ICD-10-CM | POA: Diagnosis not present

## 2023-06-27 DIAGNOSIS — I10 Essential (primary) hypertension: Secondary | ICD-10-CM | POA: Diagnosis not present

## 2023-06-27 DIAGNOSIS — I201 Angina pectoris with documented spasm: Secondary | ICD-10-CM | POA: Diagnosis not present

## 2023-06-29 ENCOUNTER — Ambulatory Visit: Payer: Medicaid Other | Admitting: Podiatry

## 2023-07-05 DIAGNOSIS — Z1211 Encounter for screening for malignant neoplasm of colon: Secondary | ICD-10-CM | POA: Diagnosis not present
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
## 2023-07-10 LAB — COLOGUARD: COLOGUARD: NEGATIVE

## 2023-07-11 ENCOUNTER — Ambulatory Visit (INDEPENDENT_AMBULATORY_CARE_PROVIDER_SITE_OTHER): Payer: Medicaid Other | Admitting: Podiatry

## 2023-07-11 ENCOUNTER — Encounter: Payer: Self-pay | Admitting: Podiatry

## 2023-07-11 DIAGNOSIS — M19071 Primary osteoarthritis, right ankle and foot: Secondary | ICD-10-CM | POA: Diagnosis not present

## 2023-07-11 DIAGNOSIS — M205X1 Other deformities of toe(s) (acquired), right foot: Secondary | ICD-10-CM | POA: Diagnosis not present

## 2023-07-11 NOTE — Progress Notes (Signed)
Subjective:  Patient ID: [name], female    DOB: [birth year],  MRN: [id number]  Chief Complaint  Patient presents with   Arthritis    Right 1st MPJ pain with range of motion and pressure. Difficulty tolerating certain shoes. Last inject 8/16 did not help for nearly as long    57 y.o. female presents with concern for pain in the right great toe joint previously had 2 steroid injections this year.  She had 1 last on August 16 did not help as long as the previous one.  Not interested in surgical intervention for this problem at this time   Past Medical History:  Diagnosis Date   Allergic rhinitis    Anginal pain (HCC)    Arthritis    Blood transfusion without reported diagnosis    Cellulitis, face 06/08/2022   Complication of anesthesia    COPD (chronic obstructive pulmonary disease) (HCC)    Dental abscess 06/09/2022   GERD (gastroesophageal reflux disease)    History of colon polyps    History of kidney stones    HTN (hypertension)    Hyperlipidemia    Kidney stones    Pancreatitis    Pneumonia    PONV (postoperative nausea and vomiting)    Primary hypertension 10/20/2021   Psychotic depression (HCC)    Shingles February 26th, 2001    Allergies  Allergen Reactions   Levofloxacin Other (See Comments) and Rash    Rash all over redness   Bactrim Ds [Sulfamethoxazole-Trimethoprim] Nausea Only   Lipitor [Atorvastatin] Other (See Comments)    Myalgia    Tape Hives   Zetia [Ezetimibe]     Muscle pain   Codeine Nausea Only   Other Rash    bandaids leave a red area if left on too long    ROS: Negative except as per HPI above  Objective:  General: AAO x3, NAD  Dermatological: With inspection and palpation of the right and left lower extremities there are no open sores, no preulcerative lesions, no rash or signs of infection present. Nails are of normal length thickness and coloration.   Vascular:  Dorsalis Pedis artery and Posterior Tibial artery pedal pulses  are 2/4 bilateral.  Capillary fill time < 3 sec to all digits.   Neruologic: Grossly intact via light touch bilateral. Protective threshold intact to all sites bilateral.   Musculoskeletal: Decreased range of motion and pain of the right first MPJ which is made worse with dorsiflexion range of motion.  Also with crossover toe deformity of the left second toe and hammertoe deformity of digits 2 through 4 on the left foot with the worst being second and third toe.  Flexible in nature.  Gait: Unassisted, Nonantalgic.   No images are attached to the encounter.  Radiographs:  Date: 12/14/2022 XR bilateral foot weightbearing AP/Lateral/Oblique   Findings: Attention directed to the right foot there is no be decreased joint space and narrowing at the hallux first metatarsophalangeal joint.  Unequal joint space narrowing is noted.  On the left foot there is no to be transverse and sagittal plane deformity the second and third toes with crossover deformity of the second toe. Assessment:   1. Hallux limitus of right foot   2. Osteoarthritis of first metatarsophalangeal (MTP) joint of right foot       Plan:  Patient was evaluated and treated and all questions answered.  # Hallux limitus of right foot We discussed the etiology and treatment options for hallux limitus / rigidus including  surgical and non surgical treatment options. We discussed shoe gear changes, orthotics, injection therapy. We also discussed cheilectomy, 1st MTP arthrodesis, and arthroplasty with implants and the rationale for all of these. We discussed that each has advantages/disadvantages, and risks/benefits.  -Recommend injection at this time -Proceeded with injection of 1 cc half percent Marcaine plain with 1 cc Kenalog 10 into the lateral aspect of the first metatarsophalangeal joint after sterile alcohol prep and applied sterile adhesive bandage afterwards.  Patient tolerated well -Discussed that if the injection does not  fully resolve her pain there would be options including cheilectomy that could be considered versus decompressive osteotomy of the first metatarsal however this would only be done if she continues to have significant pain.  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          Corinna Gab, DPM [facility] / [facility]

## 2023-07-12 ENCOUNTER — Ambulatory Visit: Payer: Medicaid Other | Attending: Rheumatology | Admitting: Rheumatology

## 2023-07-12 ENCOUNTER — Encounter: Payer: Self-pay | Admitting: Rheumatology

## 2023-07-12 ENCOUNTER — Ambulatory Visit: Payer: Medicaid Other

## 2023-07-12 VITALS — BP 128/76 | HR 75 | Resp 17 | Ht 65.0 in | Wt 162.0 lb

## 2023-07-12 DIAGNOSIS — I10 Essential (primary) hypertension: Secondary | ICD-10-CM

## 2023-07-12 DIAGNOSIS — M79641 Pain in right hand: Secondary | ICD-10-CM

## 2023-07-12 DIAGNOSIS — M79642 Pain in left hand: Secondary | ICD-10-CM

## 2023-07-12 DIAGNOSIS — M19041 Primary osteoarthritis, right hand: Secondary | ICD-10-CM

## 2023-07-12 DIAGNOSIS — M51369 Other intervertebral disc degeneration, lumbar region without mention of lumbar back pain or lower extremity pain: Secondary | ICD-10-CM

## 2023-07-12 DIAGNOSIS — M7711 Lateral epicondylitis, right elbow: Secondary | ICD-10-CM

## 2023-07-12 DIAGNOSIS — Q6671 Congenital pes cavus, right foot: Secondary | ICD-10-CM | POA: Diagnosis not present

## 2023-07-12 DIAGNOSIS — Q6672 Congenital pes cavus, left foot: Secondary | ICD-10-CM

## 2023-07-12 DIAGNOSIS — M19071 Primary osteoarthritis, right ankle and foot: Secondary | ICD-10-CM

## 2023-07-12 DIAGNOSIS — I700 Atherosclerosis of aorta: Secondary | ICD-10-CM | POA: Diagnosis not present

## 2023-07-12 DIAGNOSIS — G629 Polyneuropathy, unspecified: Secondary | ICD-10-CM

## 2023-07-12 DIAGNOSIS — M4126 Other idiopathic scoliosis, lumbar region: Secondary | ICD-10-CM | POA: Diagnosis not present

## 2023-07-12 DIAGNOSIS — R7303 Prediabetes: Secondary | ICD-10-CM | POA: Diagnosis not present

## 2023-07-12 DIAGNOSIS — M7061 Trochanteric bursitis, right hip: Secondary | ICD-10-CM | POA: Diagnosis not present

## 2023-07-12 DIAGNOSIS — Z8261 Family history of arthritis: Secondary | ICD-10-CM

## 2023-07-12 DIAGNOSIS — Z8709 Personal history of other diseases of the respiratory system: Secondary | ICD-10-CM

## 2023-07-12 DIAGNOSIS — M19072 Primary osteoarthritis, left ankle and foot: Secondary | ICD-10-CM

## 2023-07-12 DIAGNOSIS — J301 Allergic rhinitis due to pollen: Secondary | ICD-10-CM

## 2023-07-12 DIAGNOSIS — E782 Mixed hyperlipidemia: Secondary | ICD-10-CM

## 2023-07-12 DIAGNOSIS — Z96651 Presence of right artificial knee joint: Secondary | ICD-10-CM | POA: Diagnosis not present

## 2023-07-12 DIAGNOSIS — M7712 Lateral epicondylitis, left elbow: Secondary | ICD-10-CM

## 2023-07-12 DIAGNOSIS — G43809 Other migraine, not intractable, without status migrainosus: Secondary | ICD-10-CM

## 2023-07-12 DIAGNOSIS — F411 Generalized anxiety disorder: Secondary | ICD-10-CM

## 2023-07-12 DIAGNOSIS — Z720 Tobacco use: Secondary | ICD-10-CM

## 2023-07-12 DIAGNOSIS — M7062 Trochanteric bursitis, left hip: Secondary | ICD-10-CM

## 2023-07-12 DIAGNOSIS — Z840 Family history of diseases of the skin and subcutaneous tissue: Secondary | ICD-10-CM

## 2023-07-12 DIAGNOSIS — K219 Gastro-esophageal reflux disease without esophagitis: Secondary | ICD-10-CM

## 2023-07-12 DIAGNOSIS — Z6828 Body mass index (BMI) 28.0-28.9, adult: Secondary | ICD-10-CM

## 2023-07-12 DIAGNOSIS — E663 Overweight: Secondary | ICD-10-CM

## 2023-07-12 DIAGNOSIS — M19042 Primary osteoarthritis, left hand: Secondary | ICD-10-CM

## 2023-07-12 NOTE — Patient Instructions (Signed)
Hand Exercises Hand exercises can be helpful for almost anyone. They can strengthen your hands and improve flexibility and movement. The exercises can also increase blood flow to the hands. These results can make your work and daily tasks easier for you. Hand exercises can be especially helpful for people who have joint pain from arthritis or nerve damage from using their hands over and over. These exercises can also help people who injure a hand. Exercises Most of these hand exercises are gentle stretching and motion exercises. It is usually safe to do them often throughout the day. Warming up your hands before exercise may help reduce stiffness. You can do this with gentle massage or by placing your hands in warm water for 10-15 minutes. It is normal to feel some stretching, pulling, tightness, or mild discomfort when you begin new exercises. In time, this will improve. Remember to always be careful and stop right away if you feel sudden, very bad pain or your pain gets worse. You want to get better and be safe. Ask your health care provider which exercises are safe for you. Do exercises exactly as told by your provider and adjust them as told. Do not begin these exercises until told by your provider. Knuckle bend or "claw" fist  Stand or sit with your arm, hand, and all five fingers pointed straight up. Make sure to keep your wrist straight. Gently bend your fingers down toward your palm until the tips of your fingers are touching your palm. Keep your big knuckle straight and only bend the small knuckles in your fingers. Hold this position for 10 seconds. Straighten your fingers back to your starting position. Repeat this exercise 5-10 times with each hand. Full finger fist  Stand or sit with your arm, hand, and all five fingers pointed straight up. Make sure to keep your wrist straight. Gently bend your fingers into your palm until the tips of your fingers are touching the middle of your  palm. Hold this position for 10 seconds. Extend your fingers back to your starting position, stretching every joint fully. Repeat this exercise 5-10 times with each hand. Straight fist  Stand or sit with your arm, hand, and all five fingers pointed straight up. Make sure to keep your wrist straight. Gently bend your fingers at the big knuckle, where your fingers meet your hand, and at the middle knuckle. Keep the knuckle at the tips of your fingers straight and try to touch the bottom of your palm. Hold this position for 10 seconds. Extend your fingers back to your starting position, stretching every joint fully. Repeat this exercise 5-10 times with each hand. Tabletop  Stand or sit with your arm, hand, and all five fingers pointed straight up. Make sure to keep your wrist straight. Gently bend your fingers at the big knuckle, where your fingers meet your hand, as far down as you can. Keep the small knuckles in your fingers straight. Think of forming a tabletop with your fingers. Hold this position for 10 seconds. Extend your fingers back to your starting position, stretching every joint fully. Repeat this exercise 5-10 times with each hand. Finger spread  Place your hand flat on a table with your palm facing down. Make sure your wrist stays straight. Spread your fingers and thumb apart from each other as far as you can until you feel a gentle stretch. Hold this position for 10 seconds. Bring your fingers and thumb tight together again. Hold this position for 10 seconds. Repeat  this exercise 5-10 times with each hand. Making circles  Stand or sit with your arm, hand, and all five fingers pointed straight up. Make sure to keep your wrist straight. Make a circle by touching the tip of your thumb to the tip of your index finger. Hold for 10 seconds. Then open your hand wide. Repeat this motion with your thumb and each of your fingers. Repeat this exercise 5-10 times with each hand. Thumb  motion  Sit with your forearm resting on a table and your wrist straight. Your thumb should be facing up toward the ceiling. Keep your fingers relaxed as you move your thumb. Lift your thumb up as high as you can toward the ceiling. Hold for 10 seconds. Bend your thumb across your palm as far as you can, reaching the tip of your thumb for the small finger (pinkie) side of your palm. Hold for 10 seconds. Repeat this exercise 5-10 times with each hand. Grip strengthening  Hold a stress ball or other soft ball in the middle of your hand. Slowly increase the pressure, squeezing the ball as much as you can without causing pain. Think of bringing the tips of your fingers into the middle of your palm. All of your finger joints should bend when doing this exercise. Hold your squeeze for 10 seconds, then relax. Repeat this exercise 5-10 times with each hand. Contact a health care provider if: Your hand pain or discomfort gets much worse when you do an exercise. Your hand pain or discomfort does not improve within 2 hours after you exercise. If you have either of these problems, stop doing these exercises right away. Do not do them again unless your provider says that you can. Get help right away if: You develop sudden, severe hand pain or swelling. If this happens, stop doing these exercises right away. Do not do them again unless your provider says that you can. This information is not intended to replace advice given to you by your health care provider. Make sure you discuss any questions you have with your health care provider. Document Revised: 09/21/2022 Document Reviewed: 09/21/2022 [employer] Patient Education  2024 [employer] Elbow and Forearm Exercises Ask your health care provider which exercises are safe for you. Do exercises exactly as told by your provider and adjust them as directed. It is normal to feel mild stretching, pulling, tightness, or discomfort as you do these exercises. Stop  right away if you feel sudden pain or your pain gets worse. Do not begin these exercises until told by your provider. Range-of-motion exercises These exercises warm up your muscles and joints and improve the movement and flexibility of your injured elbow and forearm. The exercises also help to relieve pain, numbness, and tingling. These exercises are done using the muscles in your injured elbow and forearm (active). Elbow flexion, active  Hold your left / right arm at your side, and bend your elbow (flexion) as far as you can using only your arm muscles. Hold this position for __________ seconds. Slowly return to the starting position. Repeat __________ times. Complete this exercise __________ times a day. Elbow extension, active  Hold your left / right arm at your side, and straighten your elbow (extension) as much as you can using only your arm muscles. Hold this position for __________ seconds. Slowly return to the starting position. Repeat __________ times. Complete this exercise __________ times a day. Forearm rotation, supination This is an exercise in which you turn (rotate) your forearm palm-up (  supination). Stand or sit with your elbows at your sides. Bend your left / right elbow to a 90-degree angle (right angle). Position your forearm so that the thumb is facing the ceiling (neutral position). Rotate your palm up toward the ceiling until you feel a gentle stretch on the inside of your forearm. If told, use your other hand to help rotate your forearm farther until you feel a gentle to moderate stretch. Hold this position for __________ seconds. Slowly return to the starting position. Repeat __________ times. Complete this exercise __________ times a day. Forearm rotation, pronation This is an exercise in which you turn (rotate) your forearm palm-down (pronation). Stand or sit with your elbows at your sides. Bend your left / right elbow to a 90-degree angle (right angle). Position  your forearm so that the thumb is facing the ceiling (neutral position). Rotate your palm down until you feel a gentle stretch on the top of your forearm. If told, use your other hand to help rotate your forearm farther until you feel a gentle to moderate stretch. Hold this position for __________ seconds. Slowly return to the starting position. Repeat __________ times. Complete this exercise __________ times a day. Stretching exercises These exercises warm up your muscles and joints and improve the movement and flexibility of your injured elbow and forearm. These exercises also help to relieve pain, numbness, and tingling. These exercises are done using your healthy elbow and forearm to help stretch the muscles in your injured elbow and forearm (active-assisted). Elbow flexion, active-assisted  Hold your left / right arm at your side, and bend your elbow (flexion) as much as you can using your left / right arm muscles. Use your other hand to bend your left / right elbow farther. To do this, gently push up on your forearm until you feel a gentle stretch on the back of your elbow. Hold this position for __________ seconds. Slowly return to the starting position. Repeat __________ times. Complete this exercise __________ times a day. Elbow extension, active-assisted  Hold your left / right arm at your side, and straighten your elbow (extension) as much as you can using your left / right arm muscles. Use your other hand to straighten the left / right elbow farther. To do this, gently push down on your forearm until you feel a gentle stretch on the inside of your elbow. Hold this position for __________ seconds. Slowly return to the starting position. Repeat __________ times. Complete this exercise __________ times a day. Passive elbow flexion, supine  Lie on your back (supine position). Extend your left / right arm up in the air, bracing it with your other hand. Let your left / right hand  slowly lower toward your shoulder (passive flexion) while your elbow stays pointed toward the ceiling. You should feel a gentle stretch along the back of your upper arm and elbow. If told by your provider, you may add a small wrist weight or hand weight to increase the stretch. Hold this position for __________ seconds. Slowly return to the starting position. Repeat __________ times. Complete this exercise __________ times a day. Passive elbow extension, supine  Lie on your back (supine position). Make sure that you are in a comfortable position that lets you relax your arm muscles. Place a folded towel under your left / right upper arm so your elbow and shoulder are at the same height. Straighten your left / right arm so your elbow does not rest on the bed or towel. Let  the weight of your hand stretch your elbow (passive extension). Keep your arm and chest muscles relaxed. You should feel a stretch on the inside of your elbow. If told by your provider, you may add a small wrist weight or hand weight to increase the stretch. Hold this position for __________ seconds. Slowly release the stretch. Repeat __________ times. Complete this exercise __________ times a day. Strengthening exercises These exercises build strength and endurance in your elbow and forearm. Endurance is the ability to use your muscles for a long time, even after they get tired. Elbow flexion, isometric  Stand or sit up straight. Bend your left / right elbow in a 90-degree angle (right angle). Keep your forearm at the height of your waist. Your thumb should be pointed toward the ceiling (neutral forearm). Place your other hand on top of your left / right forearm. Gently push down while you resist with your left / right arm (isometric flexion). Push as hard as you can with both arms without causing any pain or movement at your left / right elbow. Hold this position for __________ seconds. Slowly release the tension in both  arms. Let your muscles relax completely before you repeat the exercise. Repeat __________ times. Complete this exercise __________ times a day. Elbow extension, isometric  Stand or sit up straight. Place your left / right arm so your palm faces your abdomen and is at the height of your waist. Place your other hand on the underside of your left / right forearm. Gently push up while you resist with your left / right arm (isometric extension). Push as hard as you can with both arms without causing any pain or movement at your left / right elbow. Hold this position for __________ seconds. Slowly release the tension in both arms. Let your muscles relax completely before you repeat the exercise. Repeat __________ times. Complete this exercise __________ times a day. Elbow flexion with forearm palm up  Sit on a firm chair without armrests or stand up. Place your left / right arm at your side with your elbow straight and your palm facing forward. Holding a __________ weight or gripping a rubber exercise band or tubing, bend your elbow to bring your hand toward your shoulder (flexion). Hold this position for __________ seconds. Slowly return to the starting position. Repeat __________ times. Complete this exercise __________ times a day. Elbow extension, active  Sit on a firm chair without armrests or stand up. Hold a rubber exercise band or tubing in both hands. Keeping your upper arms at your sides, bring both hands up to your left / right shoulder. Keep your left / right hand just below your other hand. Straighten your left / right elbow (extension) while keeping your other arm still. Hold this position for __________ seconds. Control the resistance of the band or tubing as you return to the starting position. Repeat __________ times. Complete this exercise __________ times a day. Forearm rotation with weight, supination  Sit with your left / right forearm supported on a table. Your elbow  should be at waist height and bent at a 90-degree angle (right angle). Gently grasp a lightweight hammer. Rest your hand over the edge of the table with your palm facing down. Without moving your left / right elbow, slowly rotate your forearm to turn your palm up toward the ceiling (supination). Hold this position for __________ seconds. Slowly return to the starting position. Repeat __________ times. Complete this exercise __________ times a day. Forearm rotation  with weight, pronation  Sit with your left / right forearm supported on a table. Keep your elbow below shoulder height. Gently grasp a lightweight hammer. Rest your hand over the edge of the table with your palm facing up. Without moving your left / right elbow, slowly rotate your forearm to turn your palm down toward the floor (pronation). Hold this position for __________ seconds. Slowly return to the starting position. Repeat __________ times. Complete this exercise __________ times a day. This information is not intended to replace advice given to you by your health care provider. Make sure you discuss any questions you have with your health care provider. Document Revised: 01/24/2023 Document Reviewed: 06/18/2022 Else[employer] Patient Education  2024 [employer] Iliotibial Band Syndrome Rehab Ask your health care provider which exercises are safe for you. Do exercises exactly as told by your provider and adjust them as told. It's normal to feel mild stretching, pulling, tightness, or discomfort as you do these exercises. Stop right away if you feel sudden pain or your pain gets a lot worse. Do not begin these exercises until told by your provider. Stretching and range-of-motion exercises These exercises warm up your muscles and joints. They also improve the movement and flexibility of your hip and pelvis. Quadriceps stretch, prone  Lie face down (prone) on a firm surface like a bed or padded floor. Bend your left / right knee.  Reach back to hold your ankle or pant leg. If you can't reach your ankle or pant leg, use a belt looped around your foot and grab the belt instead. Gently pull your heel toward your butt. Your knee should not slide out to the side. You should feel a stretch in the front of your thigh and knee, also called the quadriceps. Hold this position for __________ seconds. Repeat __________ times. Complete this exercise __________ times a day. Iliotibial band stretch The iliotibial band is a strip of tissue that runs along the outside of your hip down to your knee. Lie on your side with your left / right leg on top. Bend both knees and grab your left / right ankle. Stretch out your bottom arm to help you balance. Slowly bring your top knee back so your thigh goes behind your back. Slowly lower your top leg toward the floor until you feel a gentle stretch on the outside of your left / right hip and thigh. If you don't feel a stretch and your knee won't go farther, place the heel of your other foot on top of your knee and pull your knee down toward the floor with your foot. Hold this position for __________ seconds. Repeat __________ times. Complete this exercise __________ times a day. Strengthening exercises These exercises build strength and endurance in your hip and pelvis. Endurance means your muscles can keep working even when they're tired. Straight leg raises, side-lying This exercise strengthens the muscles that rotate the leg at the hip and move it away from your body. These muscles are called hip abductors. Lie on your side with your left / right leg on top. Lie so your head, shoulder, hip, and knee line up. You can bend your bottom knee to help you balance. Roll your hips slightly forward so they're stacked directly over each other. Your left / right knee should face forward. Tense the muscles in your outer thigh and hip. Lift your top leg 4-6 inches (10-15 cm) off the ground. Hold this position  for __________ seconds. Slowly lower your leg back  down to the starting position. Let your muscles fully relax before doing this exercise again. Repeat __________ times. Complete this exercise __________ times a day. Leg raises, prone This exercise strengthens the muscles that move the hips backward. These muscles are called hip extensors. Lie face down (prone) on your bed or a firm surface. You can put a pillow under your hips for comfort and to support your lower back. Bend your left / right knee so your foot points straight up toward the ceiling. Keep the other leg straight and behind you. Squeeze your butt muscles. Lift your left / right thigh off the firm surface. Do not let your back arch. Tense your thigh muscle as hard as you can without having more knee pain. Hold this position for __________ seconds. Slowly lower your leg to the starting position. Allow your leg to relax all the way. Repeat __________ times. Complete this exercise __________ times a day. Hip hike  Stand sideways on a bottom step. Place your feet so that your left / right leg is on the step, and the other foot is hanging off the side. If you need support for balance, hold onto a railing or wall. Keep your knees straight and your abdomen square, meaning your hips are level. Then, lift your left / right hip up toward the ceiling. Slowly let your leg that's hanging off the step lower towards the floor. Your foot should get closer to the ground. Do not lean or bend your knees during this movement. Repeat __________ times. Complete this exercise __________ times a day. This information is not intended to replace advice given to you by your health care provider. Make sure you discuss any questions you have with your health care provider. Document Revised: 11/19/2022 Document Reviewed: 11/19/2022 [employer] Patient Education  2024 [employer].

## 2023-07-13 ENCOUNTER — Encounter: Payer: Self-pay | Admitting: Podiatry

## 2023-07-13 LAB — CYCLIC CITRUL PEPTIDE ANTIBODY, IGG: Cyclic Citrullin Peptide Ab: <16 U

## 2023-07-13 LAB — URIC ACID: Uric Acid, Serum: 2.3 mg/dL — ABNORMAL LOW (ref 2.5–7.0)

## 2023-07-13 LAB — RHEUMATOID FACTOR: Rheumatoid fact SerPl-aCnc: <10 [IU]/mL (ref <14)

## 2023-07-13 LAB — SEDIMENTATION RATE: Sed Rate: 6 mm/h (ref 0–30)

## 2023-07-14 DIAGNOSIS — M19022 Primary osteoarthritis, left elbow: Secondary | ICD-10-CM | POA: Diagnosis not present

## 2023-07-14 DIAGNOSIS — M19021 Primary osteoarthritis, right elbow: Secondary | ICD-10-CM | POA: Insufficient documentation

## 2023-07-14 DIAGNOSIS — M25511 Pain in right shoulder: Secondary | ICD-10-CM | POA: Insufficient documentation

## 2023-07-14 NOTE — Progress Notes (Signed)
All the labs are within normal limits.  Will discuss results at the follow-up visit.

## 2023-07-16 ENCOUNTER — Other Ambulatory Visit: Payer: Self-pay | Admitting: Family Medicine

## 2023-07-20 ENCOUNTER — Encounter: Payer: Self-pay | Admitting: Family Medicine

## 2023-07-20 ENCOUNTER — Ambulatory Visit (INDEPENDENT_AMBULATORY_CARE_PROVIDER_SITE_OTHER): Payer: Medicaid Other | Admitting: Family Medicine

## 2023-07-20 VITALS — BP 124/68 | HR 77 | Temp 97.2°F | Ht 66.0 in | Wt 163.0 lb

## 2023-07-20 DIAGNOSIS — J0101 Acute recurrent maxillary sinusitis: Secondary | ICD-10-CM | POA: Diagnosis not present

## 2023-07-20 DIAGNOSIS — J029 Acute pharyngitis, unspecified: Secondary | ICD-10-CM | POA: Diagnosis not present

## 2023-07-20 LAB — POCT RAPID STREP A (OFFICE): Rapid Strep A Screen: NEGATIVE

## 2023-07-20 MED ORDER — CEFDINIR 300 MG PO CAPS: 600.0000 mg | ORAL_CAPSULE | Freq: Every day | ORAL | 0 refills | Status: DC

## 2023-07-20 NOTE — Assessment & Plan Note (Signed)
Symptoms and exam c/w sinusitis -No evidence of AOM, CAP, or strep pharyngitis -given the duration of the symptoms and being a smoker, suspect recurrent bacterial etiology. -Cefdinir 300 mg TWICE A DAY or 600 mg once a day for 10 days - Discussed symptomatic treatment with (Flonase, robitussin, Xyzal, Singulair, and NSAIDs as needed) Return if symptoms get worse or fail to improve

## 2023-07-20 NOTE — Assessment & Plan Note (Signed)
Strep - negative Try to cut back on smoking Gargle with warm salt water  Honey and lemon can help with sore throat and cough

## 2023-07-20 NOTE — Patient Instructions (Signed)
Try to cut back on smoking to help the symptoms of sore throat and cough.

## 2023-07-20 NOTE — Progress Notes (Signed)
Acute Office Visit  Subjective:    Patient ID: [name], female    DOB: [birth year], 57 y.o.   MRN: [id number]  Chief Complaint  Patient presents with   Sore Throat    HPI: Patient is in today for sore throat and left ear pain that started a couple weeks ago. Denies fever or chills, mild dry cough at night. Has been taking robitussin. She was just seen last month for an acute sinus infection. She was given Augmentin, Xyzal, tessalon capsules and dextromethorphan. Patient is a smoker and states that she knows she needs to quit. Patient denies shortness of breath or chest pain. Patient has had a headache and worse she bends over.  Past Medical History:  Diagnosis Date   Allergic rhinitis    Anginal pain (HCC)    Arthritis    Blood transfusion without reported diagnosis    Cellulitis, face 06/08/2022   Complication of anesthesia    COPD (chronic obstructive pulmonary disease) (HCC)    Dental abscess 06/09/2022   GERD (gastroesophageal reflux disease)    History of colon polyps    History of kidney stones    HTN (hypertension)    Hyperlipidemia    Kidney stones    Pancreatitis    Pneumonia    PONV (postoperative nausea and vomiting)    Primary hypertension 10/20/2021   Psychotic depression (HCC)    Shingles February 26th, 2001    Past Surgical History:  Procedure Laterality Date   ABDOMINAL HYSTERECTOMY     BACK SURGERY  02/2023   CARDIAC CATHETERIZATION  2008   CESAREAN SECTION     CHOLECYSTECTOMY     COLONOSCOPY  05/02/2017   Colonic polyp status post polypectomy. Small internal hemorrhoids   FRACTURE SURGERY Bilateral    [facility]   REPLACEMENT TOTAL KNEE Right 02/09/2019   TUBAL LIGATION      Family History  Problem Relation Age of Onset   Arthritis Mother    Lung disease Mother    Parkinson's disease Mother    COPD Father    Arthritis Father    Healthy Sister    Breast cancer Maternal Aunt        great aunt   Rheum arthritis Maternal Uncle     Rheum arthritis Maternal Grandfather    Healthy Son    High Cholesterol Son    Healthy Son    Colon cancer Cousin        mother's cousin   Diabetes Other    Stroke Other    Hypertension Other    Hyperlipidemia Other    Asthma Other    Heart failure Other    Thyroid disease Other    Heart attack Other    COPD Other    Arrhythmia Other    Arthritis Other    Migraines Other     Social History   Socioeconomic History   Marital status: Widowed    Spouse name: Not on file   Number of children: 2   Years of education: Not on file   Highest education level: GED or equivalent  Occupational History   Not on file  Tobacco Use   Smoking status: Every Day    Current packs/day: 1.00    Average packs/day: 1 pack/day for 20.0 years (20.0 ttl pk-yrs)    Types: Cigarettes    Passive exposure: Past   Smokeless tobacco: Never  Vaping Use   Vaping status: Never Used  Substance and Sexual Activity   Alcohol  use: Yes    Comment: occas   Drug use: No   Sexual activity: Not Currently  Other Topics Concern   Not on file  Social History Narrative   Not on file   Social Determinants of Health   Financial Resource Strain: High Risk (12/15/2022)   Overall Financial Resource Strain (CARDIA)    Difficulty of Paying Living Expenses: Very hard  Food Insecurity: Food Insecurity Present (12/15/2022)   Hunger Vital Sign    Worried About Running Out of Food in the Last Year: Often true    Ran Out of Food in the Last Year: Sometimes true  Transportation Needs: No Transportation Needs (12/15/2022)   PRAPARE - Administrator, Civil Service (Medical): No    Lack of Transportation (Non-Medical): No  Physical Activity: Inactive (04/12/2023)   Exercise Vital Sign    Days of Exercise per Week: 0 days    Minutes of Exercise per Session: 0 min  Stress: Stress Concern Present (12/15/2022)   [employer] of Occupational Health - Occupational Stress Questionnaire    Feeling of Stress :  Rather much  Social Connections: Moderately Isolated (12/15/2022)   Social Connection and Isolation Panel [NHANES]    Frequency of Communication with Friends and Family: More than three times a week    Frequency of Social Gatherings with Friends and Family: Patient declined    Attends Religious Services: More than 4 times per year    Active Member of [employer] or Organizations: No    Attends Banker Meetings: Not on file    Marital Status: Widowed  Intimate Partner Violence: Not At Risk (04/12/2023)   Humiliation, Afraid, Rape, and Kick questionnaire    Fear of Current or Ex-Partner: No    Emotionally Abused: No    Physically Abused: No    Sexually Abused: No    Outpatient Medications Prior to Visit  Medication Sig Dispense Refill   gabapentin (NEURONTIN) 300 MG capsule Take 300 mg by mouth 3 (three) times daily.     meloxicam (MOBIC) 15 MG tablet Take 15 mg by mouth daily.     acetaminophen (TYLENOL) 650 MG CR tablet Take 650-1,300 mg by mouth every 8 (eight) hours as needed for pain.     albuterol (VENTOLIN HFA) 108 (90 Base) MCG/ACT inhaler INHALE 2 PUFFS INTO LUNGS EVERY 6 TO 8 HOURS AS NEEDED FOR WHEEZING 18 g 1   ALPRAZolam (XANAX) 0.25 MG tablet TAKE 1 TABLET(0.25 MG) BY MOUTH DAILY AS NEEDED FOR ANXIETY 30 tablet 3   amLODipine (NORVASC) 10 MG tablet TAKE 1 TABLET BY MOUTH DAILY 90 tablet 0   buPROPion (WELLBUTRIN XL) 300 MG 24 hr tablet TAKE 1 TABLET(300 MG) BY MOUTH DAILY. 90 tablet 1   cyclobenzaprine (FLEXERIL) 10 MG tablet Take 1 tablet (10 mg total) by mouth 3 (three) times daily as needed for muscle spasms. 270 tablet 0   dextromethorphan 15 MG/5ML syrup Take 10 mLs (30 mg total) by mouth 4 (four) times daily as needed for cough. 120 mL 0   dicyclomine (BENTYL) 10 MG capsule TAKE 1 CAPSULE(10 MG) BY MOUTH FOUR TIMES DAILY BEFORE MEALS AND AT BEDTIME 120 capsule 2   Docusate Sodium (DSS) 100 MG CAPS Take 100 mg by mouth daily as needed (constipation).     Evolocumab  (REPATHA SURECLICK) 140 MG/ML SOAJ Inject 140 mg as directed every 14 (fourteen) days. 2 mL 2   famotidine (PEPCID) 40 MG tablet Take 1 tablet (40 mg total) by  mouth daily as needed for heartburn or indigestion. 90 tablet 1   fluticasone (FLONASE) 50 MCG/ACT nasal spray SHAKE LIQUID AND USE 2 SPRAYS IN EACH NOSTRIL DAILY 16 g 6   fluticasone-salmeterol (ADVAIR HFA) 115-21 MCG/ACT inhaler Inhale 2 puffs into the lungs 2 (two) times daily. (Patient taking differently: Inhale 2 puffs into the lungs 2 (two) times daily as needed (asthma).) 3 each 3   furosemide (LASIX) 20 MG tablet Take 1 tablet (20 mg total) by mouth daily as needed for edema. 1 tablet 0   levocetirizine (XYZAL) 5 MG tablet TAKE 1 TABLET(5 MG) BY MOUTH EVERY EVENING 90 tablet 0   montelukast (SINGULAIR) 10 MG tablet Take 1 tablet (10 mg total) by mouth at bedtime. TAKE 1 TABLET(10 MG) BY MOUTH AT BEDTIME Strength: 10 mg 90 tablet 0   nicotine (NICODERM CQ - DOSED IN MG/24 HOURS) 21 mg/24hr patch APPLY 1 PATCH(21 MG) TOPICALLY TO THE SKIN DAILY 28 patch 0   nitroGLYCERIN (NITROSTAT) 0.4 MG SL tablet DISSOLVE 1 TABLET BY MOUTH UNDER THE TONGUE AS NEEDED FOR CHEST PAIN EVERY 5 MINUTES UP TO 3 TIMES 25 tablet 0   omeprazole (PRILOSEC) 40 MG capsule Take 1 capsule (40 mg total) by mouth daily. 90 capsule 3   OZEMPIC, 2 MG/DOSE, 8 MG/3ML SOPN INJECT 2 MG AS DIRECTED ONCE A WEEK 9 mL 0   triamcinolone (KENALOG) 0.025 % ointment Apply 1 Application topically 2 (two) times daily. Apply to corners of mouth twice daily 30 g 0   amoxicillin-clavulanate (AUGMENTIN) 875-125 MG tablet Take 1 tablet by mouth 2 (two) times daily. 20 tablet 0   benzonatate (TESSALON) 200 MG capsule Take 1 capsule (200 mg total) by mouth 3 (three) times daily as needed for cough. 30 capsule 0   fenofibrate (TRICOR) 145 MG tablet TAKE 1 TABLET BY MOUTH DAILY 90 tablet 0   No facility-administered medications prior to visit.    Allergies  Allergen Reactions    Levofloxacin Other (See Comments) and Rash    Rash all over redness   Bactrim Ds [Sulfamethoxazole-Trimethoprim] Nausea Only   Lipitor [Atorvastatin] Other (See Comments)    Myalgia    Tape Hives   Zetia [Ezetimibe]     Muscle pain   Codeine Nausea Only   Other Rash    bandaids leave a red area if left on too long    Review of Systems  Constitutional:  Negative for chills, fatigue and fever.  HENT:  Positive for congestion, ear pain, sinus pressure, sinus pain and sore throat. Negative for postnasal drip and rhinorrhea.   Respiratory:  Positive for cough. Negative for shortness of breath.   Cardiovascular:  Negative for chest pain.  Genitourinary:  Positive for urgency. Negative for frequency.  Neurological:  Positive for headaches. Negative for dizziness.       Objective:        07/20/2023   10:56 AM 07/12/2023   10:47 AM 07/12/2023   10:35 AM  Vitals with BMI  Height 5\' 6"   5\' 5"   Weight 163 lbs  162 lbs  BMI 26.32  26.96  Systolic 124 128 657  Diastolic 68 76 79  Pulse 77 75 73    No data found.   Physical Exam Vitals reviewed.  Constitutional:      Appearance: Normal appearance. She is normal weight.  HENT:     Head: Normocephalic.     Right Ear: Tympanic membrane normal.     Left Ear: Tympanic membrane normal.  Nose:     Right Sinus: Frontal sinus tenderness present.     Left Sinus: Frontal sinus tenderness present.     Mouth/Throat:     Pharynx: Posterior oropharyngeal erythema present.  Neck:     Vascular: No carotid bruit.  Cardiovascular:     Rate and Rhythm: Normal rate and regular rhythm.     Heart sounds: Normal heart sounds.  Pulmonary:     Effort: Pulmonary effort is normal. No respiratory distress.     Breath sounds: Normal breath sounds.  Abdominal:     General: Abdomen is flat. Bowel sounds are normal.     Palpations: Abdomen is soft.     Tenderness: There is no abdominal tenderness.  Neurological:     Mental Status: She is  alert and oriented to person, place, and time.  Psychiatric:        Mood and Affect: Mood normal.        Behavior: Behavior normal.     Health Maintenance Due  Topic Date Due   Cervical Cancer Screening (HPV/Pap Cotest)  04/05/2017   Lung Cancer Screening  12/31/2022    There are no preventive care reminders to display for this patient.   Lab Results  Component Value Date   TSH 1.690 09/09/2022   Lab Results  Component Value Date   WBC 6.3 04/12/2023   HGB 14.4 04/12/2023   HCT 43.2 04/12/2023   MCV 96 04/12/2023   PLT 351 04/12/2023   Lab Results  Component Value Date   NA 143 04/12/2023   K 5.0 04/12/2023   CO2 24 04/12/2023   GLUCOSE 85 04/12/2023   BUN 8 04/12/2023   CREATININE 0.53 (L) 04/12/2023   BILITOT <0.2 04/12/2023   ALKPHOS 122 (H) 04/12/2023   AST 18 04/12/2023   ALT 20 04/12/2023   PROT 6.6 04/12/2023   ALBUMIN 4.4 04/12/2023   CALCIUM 10.0 04/12/2023   ANIONGAP 9 02/21/2023   EGFR 108 04/12/2023   Lab Results  Component Value Date   CHOL 185 04/12/2023   Lab Results  Component Value Date   HDL 48 04/12/2023   Lab Results  Component Value Date   LDLCALC 103 (H) 04/12/2023   Lab Results  Component Value Date   TRIG 200 (H) 04/12/2023   Lab Results  Component Value Date   CHOLHDL 3.9 04/12/2023   Lab Results  Component Value Date   HGBA1C 5.2 04/12/2023       Assessment & Plan:  Acute recurrent maxillary sinusitis Assessment & Plan: Symptoms and exam c/w sinusitis -No evidence of AOM, CAP, or strep pharyngitis -given the duration of the symptoms and being a smoker, suspect recurrent bacterial etiology. -Cefdinir 300 mg TWICE A DAY or 600 mg once a day for 10 days - Discussed symptomatic treatment with (Flonase, robitussin, Xyzal, Singulair, and NSAIDs as needed) Return if symptoms get worse or fail to improve   Orders: -     Cefdinir; Take 2 capsules (600 mg total) by mouth daily.  Dispense: 10 capsule; Refill: 0  Sore  throat Assessment & Plan: Strep - negative Try to cut back on smoking Gargle with warm salt water  Honey and lemon can help with sore throat and cough  Orders: -     POCT rapid strep A     Meds ordered this encounter  Medications   cefdinir (OMNICEF) 300 MG capsule    Sig: Take 2 capsules (600 mg total) by mouth daily.    Dispense:  10 capsule    Refill:  0    Orders Placed This Encounter  Procedures   Rapid Strep A      Follow-up: Return if symptoms worsen or fail to improve.  An After Visit Summary was printed and given to the patient.  Total time spent on today's visit was greater than 30 minutes, including both face-to-face time and nonface-to-face time personally spent on review of chart (labs and imaging), discussing labs and goals, discussing further work-up, treatment options, referrals to specialist if needed, reviewing outside records if pertinent, answering patient's questions, and coordinating care.   Lajuana Matte, FNP [facility] [phone]

## 2023-07-21 ENCOUNTER — Other Ambulatory Visit: Payer: Self-pay | Admitting: Family Medicine

## 2023-07-21 DIAGNOSIS — M545 Low back pain, unspecified: Secondary | ICD-10-CM
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
## 2023-07-24 ENCOUNTER — Encounter: Payer: Self-pay | Admitting: Podiatry

## 2023-07-25 ENCOUNTER — Encounter: Payer: Self-pay | Admitting: Family Medicine

## 2023-07-25 ENCOUNTER — Other Ambulatory Visit: Payer: Self-pay | Admitting: Family Medicine

## 2023-07-25 ENCOUNTER — Telehealth: Payer: Self-pay

## 2023-07-25 DIAGNOSIS — J0101 Acute recurrent maxillary sinusitis: Secondary | ICD-10-CM

## 2023-07-25 MED ORDER — CEFDINIR 300 MG PO CAPS: 600.0000 mg | ORAL_CAPSULE | Freq: Every day | ORAL | 0 refills | Status: DC

## 2023-07-25 NOTE — Telephone Encounter (Signed)
Patient called and stated that the antibiotic that was prescribed did not help. Please advise.

## 2023-07-26 ENCOUNTER — Ambulatory Visit: Payer: Medicaid Other

## 2023-07-26 NOTE — Telephone Encounter (Signed)
Please have patient schedule an appointment.

## 2023-08-01 ENCOUNTER — Other Ambulatory Visit: Payer: Self-pay | Admitting: Family Medicine

## 2023-08-01 ENCOUNTER — Telehealth: Payer: Self-pay | Admitting: Family Medicine

## 2023-08-01 DIAGNOSIS — J0111 Acute recurrent frontal sinusitis: Secondary | ICD-10-CM

## 2023-08-01 NOTE — Telephone Encounter (Signed)
Copied from CRM [id number]. Topic: Referral - Request for Referral >> Aug 01, 2023  9:56 AM Tiffany H wrote: Did the patient discuss referral with their provider in the last year? No (If No - schedule appointment) (If Yes - send message)  Appointment offered? Yes  Type of order/referral and detailed reason for visit: ENT referral - patient says that she has a high pitch in her ear. Patient advised that she has a persistent sore throat that has undergone two different antibiotics. She would also like the referral to indicate trouble with throat as well as ringing in ear.   Preference of office, provider, location: [facility], patient has an upcoming appointment. Would like to be sent the referral.   If referral order, have you been seen by this specialty before? No (If Yes, this issue or another issue? When? Where?  Can we respond through MyChart? Yes

## 2023-08-01 NOTE — Telephone Encounter (Signed)
It appears the patient has an appt on 08/05/23 with Norwood Levo, is this something that can be addressed at that time?

## 2023-08-02 ENCOUNTER — Ambulatory Visit: Payer: Medicaid Other | Admitting: Rheumatology

## 2023-08-02 ENCOUNTER — Ambulatory Visit: Payer: Medicaid Other | Admitting: Family Medicine

## 2023-08-03 ENCOUNTER — Other Ambulatory Visit: Payer: Self-pay | Admitting: Family Medicine

## 2023-08-03 DIAGNOSIS — H9192 Unspecified hearing loss, left ear: Secondary | ICD-10-CM

## 2023-08-04 ENCOUNTER — Encounter: Payer: Self-pay | Admitting: Rheumatology

## 2023-08-04 ENCOUNTER — Ambulatory Visit: Payer: Medicaid Other

## 2023-08-04 ENCOUNTER — Ambulatory Visit: Payer: Medicaid Other | Attending: Rheumatology | Admitting: Rheumatology

## 2023-08-04 ENCOUNTER — Telehealth: Payer: Self-pay

## 2023-08-04 VITALS — BP 113/74 | HR 81 | Resp 16 | Ht 65.0 in | Wt 165.4 lb

## 2023-08-04 DIAGNOSIS — Z8709 Personal history of other diseases of the respiratory system: Secondary | ICD-10-CM

## 2023-08-04 DIAGNOSIS — Q6672 Congenital pes cavus, left foot: Secondary | ICD-10-CM

## 2023-08-04 DIAGNOSIS — Z8261 Family history of arthritis: Secondary | ICD-10-CM

## 2023-08-04 DIAGNOSIS — I700 Atherosclerosis of aorta: Secondary | ICD-10-CM

## 2023-08-04 DIAGNOSIS — M7711 Lateral epicondylitis, right elbow: Secondary | ICD-10-CM | POA: Diagnosis not present

## 2023-08-04 DIAGNOSIS — M19072 Primary osteoarthritis, left ankle and foot: Secondary | ICD-10-CM

## 2023-08-04 DIAGNOSIS — Z6828 Body mass index (BMI) 28.0-28.9, adult: Secondary | ICD-10-CM

## 2023-08-04 DIAGNOSIS — M4126 Other idiopathic scoliosis, lumbar region: Secondary | ICD-10-CM | POA: Diagnosis not present

## 2023-08-04 DIAGNOSIS — M19041 Primary osteoarthritis, right hand: Secondary | ICD-10-CM

## 2023-08-04 DIAGNOSIS — M79641 Pain in right hand: Secondary | ICD-10-CM | POA: Diagnosis not present

## 2023-08-04 DIAGNOSIS — Z840 Family history of diseases of the skin and subcutaneous tissue: Secondary | ICD-10-CM

## 2023-08-04 DIAGNOSIS — Z96651 Presence of right artificial knee joint: Secondary | ICD-10-CM

## 2023-08-04 DIAGNOSIS — M51369 Other intervertebral disc degeneration, lumbar region without mention of lumbar back pain or lower extremity pain: Secondary | ICD-10-CM

## 2023-08-04 DIAGNOSIS — G43809 Other migraine, not intractable, without status migrainosus: Secondary | ICD-10-CM

## 2023-08-04 DIAGNOSIS — M0600 Rheumatoid arthritis without rheumatoid factor, unspecified site: Secondary | ICD-10-CM

## 2023-08-04 DIAGNOSIS — Q6671 Congenital pes cavus, right foot: Secondary | ICD-10-CM

## 2023-08-04 DIAGNOSIS — J301 Allergic rhinitis due to pollen: Secondary | ICD-10-CM

## 2023-08-04 DIAGNOSIS — M19071 Primary osteoarthritis, right ankle and foot: Secondary | ICD-10-CM

## 2023-08-04 DIAGNOSIS — M7712 Lateral epicondylitis, left elbow: Secondary | ICD-10-CM

## 2023-08-04 DIAGNOSIS — Z79899 Other long term (current) drug therapy: Secondary | ICD-10-CM | POA: Diagnosis not present

## 2023-08-04 DIAGNOSIS — I10 Essential (primary) hypertension: Secondary | ICD-10-CM

## 2023-08-04 DIAGNOSIS — M79642 Pain in left hand: Secondary | ICD-10-CM

## 2023-08-04 DIAGNOSIS — M19042 Primary osteoarthritis, left hand: Secondary | ICD-10-CM

## 2023-08-04 DIAGNOSIS — E663 Overweight: Secondary | ICD-10-CM

## 2023-08-04 DIAGNOSIS — M7061 Trochanteric bursitis, right hip: Secondary | ICD-10-CM

## 2023-08-04 DIAGNOSIS — M1811 Unilateral primary osteoarthritis of first carpometacarpal joint, right hand: Secondary | ICD-10-CM | POA: Diagnosis not present

## 2023-08-04 DIAGNOSIS — M7062 Trochanteric bursitis, left hip: Secondary | ICD-10-CM

## 2023-08-04 DIAGNOSIS — K219 Gastro-esophageal reflux disease without esophagitis: Secondary | ICD-10-CM

## 2023-08-04 DIAGNOSIS — M25532 Pain in left wrist: Secondary | ICD-10-CM | POA: Diagnosis not present

## 2023-08-04 DIAGNOSIS — E782 Mixed hyperlipidemia: Secondary | ICD-10-CM

## 2023-08-04 DIAGNOSIS — F411 Generalized anxiety disorder: Secondary | ICD-10-CM

## 2023-08-04 DIAGNOSIS — R7303 Prediabetes: Secondary | ICD-10-CM

## 2023-08-04 DIAGNOSIS — M7120 Synovial cyst of popliteal space [Baker], unspecified knee: Secondary | ICD-10-CM

## 2023-08-04 DIAGNOSIS — Z720 Tobacco use: Secondary | ICD-10-CM

## 2023-08-04 MED ORDER — HYDROXYCHLOROQUINE SULFATE 200 MG PO TABS: ORAL_TABLET | ORAL | 0 refills | Status: DC

## 2023-08-04 NOTE — Patient Instructions (Addendum)
Please make-up appointment with ophthalmologist for your medication monitoring. Take the form provided today to that appt. They will complete and fax back to [place]   Hydroxychloroquine Tablets What is this medication? HYDROXYCHLOROQUINE (hye drox ee KLOR oh kwin) treats autoimmune conditions, such as rheumatoid arthritis and lupus. It works by slowing down an overactive immune system. It may also be used to prevent and treat malaria. It works by killing the parasite that causes malaria. It belongs to a group of medications called DMARDs. This medicine may be used for other purposes; ask your health care provider or pharmacist if you have questions. COMMON BRAND NAME(S): Plaquenil, Quineprox, SOVUNA What should I tell my care team before I take this medication? They need to know if you have any of these conditions: Diabetes Eye disease, vision problems Frequently drink alcohol G6PD deficiency Heart disease Irregular heartbeat or rhythm Kidney disease Liver disease Porphyria Psoriasis An unusual or allergic reaction to hydroxychloroquine, other medications, foods, dyes, or preservatives Pregnant or trying to get pregnant Breastfeeding How should I use this medication? Take this medication by mouth with water. Take it as directed on the prescription label. Do not cut, crush, or chew this medication. Swallow the tablets whole. Take it with food. Do not take it more than directed. Take all of this medication unless your care team tells you to stop it early. Keep taking it even if you think you are better. Take products with antacids in them at a different time of day than this medication. Take this medication 4 hours before or 4 hours after antacids. Talk to your care team if you have questions. Talk to your care team about the use of this medication in children. While this medication may be prescribed for selected conditions, precautions do apply. Overdosage: If you think you have taken too much  of this medicine contact a poison control center or emergency room at once. NOTE: This medicine is only for you. Do not share this medicine with others. What if I miss a dose? If you miss a dose, take it as soon as you can. If it is almost time for your next dose, take only that dose. Do not take double or extra doses. What may interact with this medication? Do not take this medication with any of the following: Cisapride Dronedarone Pimozide Thioridazine This medication may also interact with the following: Ampicillin Antacids Cimetidine Cyclosporine Digoxin Kaolin Medications for diabetes, such as insulin, glipizide, glyburide Medications for seizures, such as carbamazepine, phenobarbital, phenytoin Mefloquine Methotrexate Other medications that cause heart rhythm changes Praziquantel This list may not describe all possible interactions. Give your health care provider a list of all the medicines, herbs, non-prescription drugs, or dietary supplements you use. Also tell them if you smoke, drink alcohol, or use illegal drugs. Some items may interact with your medicine. What should I watch for while using this medication? Visit your care team for regular checks on your progress. Tell your care team if your symptoms do not start to get better or if they get worse. You may need blood work done while you are taking this medication. If you take other medications that can affect heart rhythm, you may need more testing. Talk to your care team if you have questions. Your vision may be tested before and during use of this medication. Tell your care team right away if you have any change in your eyesight. This medication may cause serious skin reactions. They can happen weeks to months after starting  the medication. Contact your care team right away if you notice fevers or flu-like symptoms with a rash. The rash may be red or purple and then turn into blisters or peeling of the skin. Or, you might  notice a red rash with swelling of the face, lips or lymph nodes in your neck or under your arms. If you or your family notice any changes in your behavior, such as new or worsening depression, thoughts of harming yourself, anxiety, or other unusual or disturbing thoughts, or memory loss, call your care team right away. What side effects may I notice from receiving this medication? Side effects that you should report to your care team as soon as possible: Allergic reactions--skin rash, itching, hives, swelling of the face, lips, tongue, or throat Aplastic anemia--unusual weakness or fatigue, dizziness, headache, trouble breathing, increased bleeding or bruising Change in vision Heart rhythm changes--fast or irregular heartbeat, dizziness, feeling faint or lightheaded, chest pain, trouble breathing Infection--fever, chills, cough, or sore throat Low blood sugar (hypoglycemia)--tremors or shaking, anxiety, sweating, cold or clammy skin, confusion, dizziness, rapid heartbeat Muscle injury--unusual weakness or fatigue, muscle pain, dark yellow or brown urine, decrease in amount of urine Pain, tingling, or numbness in the hands or feet Rash, fever, and swollen lymph nodes Redness, blistering, peeling, or loosening of the skin, including inside the mouth Thoughts of suicide or self-harm, worsening mood, or feelings of depression Unusual bruising or bleeding Side effects that usually do not require medical attention (report to your care team if they continue or are bothersome): Diarrhea Headache Nausea Stomach pain Vomiting This list may not describe all possible side effects. Call your doctor for medical advice about side effects. You may report side effects to FDA at 1-800-FDA-1088. Where should I keep my medication? Keep out of the reach of children and pets. Store at room temperature up to 30 degrees C (86 degrees F). Protect from light. Get rid of any unused medication after the expiration  date. To get rid of medications that are no longer needed or have expired: Take the medication to a medication take-back program. Check with your pharmacy or law enforcement to find a location. If you cannot return the medication, check the label or package insert to see if the medication should be thrown out in the garbage or flushed down the toilet. If you are not sure, ask your care team. If it is safe to put it in the trash, empty the medication out of the container. Mix the medication with cat litter, dirt, coffee grounds, or other unwanted substance. Seal the mixture in a bag or container. Put it in the trash. NOTE: This sheet is a summary. It may not cover all possible information. If you have questions about this medicine, talk to your doctor, pharmacist, or health care provider.  2024 Elsevier/Gold Standard (2022-03-15 00:00:00)  Vaccines You are taking a medication(s) that can suppress your immune system.  The following immunizations are recommended: Flu annually Covid-19  RSV Td/Tdap (tetanus, diphtheria, pertussis) every 10 years Pneumonia (Prevnar 15 then Pneumovax 23 at least 1 year apart.  Alternatively, can take Prevnar 20 without needing additional dose) Shingrix: 2 doses from 4 weeks to 6 months apart  Please check with your PCP to make sure you are up to date.   Standing Labs We placed an order today for your standing lab work.   Please have your standing labs drawn in 1 month, 3 months and then every 5 months  Please have your labs  drawn 2 weeks prior to your appointment so that the provider can discuss your lab results at your appointment, if possible.  Please note that you may see your imaging and lab results in MyChart before we have reviewed them. We will contact you once all results are reviewed. Please allow our office up to 72 hours to thoroughly review all of the results before contacting the office for clarification of your results.  WALK-IN LAB HOURS   Monday through Thursday from 8:00 am -12:30 pm and 1:00 pm-5:00 pm and Friday from 8:00 am-12:00 pm.  Patients with office visits requiring labs will be seen before walk-in labs.  You may encounter longer than normal wait times. Please allow additional time. Wait times may be shorter on  Monday and Thursday afternoons.  We do not book appointments for walk-in labs. We appreciate your patience and understanding with our staff.   Labs are drawn by [facility]. Please bring your co-pay at the time of your lab draw.  You may receive a bill from [facility] for your lab work.  Please note if you are on Hydroxychloroquine and and an order has been placed for a Hydroxychloroquine level,  you will need to have it drawn 4 hours or more after your last dose.  If you wish to have your labs drawn at another location, please call the office 24 hours in advance so we can fax the orders.  The office is located at [address], [address], [place], [place] [address]   If you have any questions regarding directions or hours of operation,  please call [phone].   As a reminder, please drink plenty of water prior to coming for your lab work. Thanks!

## 2023-08-04 NOTE — Telephone Encounter (Signed)
Patient called and asked if the plaquenil is safe to take with ozempic. I assured the patient that Dr. Corliss Skains as well as Emi Belfast, our clinic pharmacist checked her medication list but I did verbally discuss with Dr. Corliss Skains just now and she did say plaquenil is safe to take with ozempic. I advised patient and she verbalized understanding.

## 2023-08-04 NOTE — Progress Notes (Signed)
Pharmacy Note  Subjective: Patient presents today to [facility] for follow up office visit.   Patient seen by the pharmacist for counseling on hydroxychloroquine for rheumatoid arthritis.    Objective: CMP     Component Value Date/Time   NA 143 04/12/2023 0807   K 5.0 04/12/2023 0807   CL 101 04/12/2023 0807   CO2 24 04/12/2023 0807   GLUCOSE 85 04/12/2023 0807   GLUCOSE 89 02/21/2023 1206   BUN 8 04/12/2023 0807   CREATININE 0.53 (L) 04/12/2023 0807   CREATININE 0.58 06/24/2022 1502   CALCIUM 10.0 04/12/2023 0807   PROT 6.6 04/12/2023 0807   ALBUMIN 4.4 04/12/2023 0807   AST 18 04/12/2023 0807   ALT 20 04/12/2023 0807   ALKPHOS 122 (H) 04/12/2023 0807   BILITOT <0.2 04/12/2023 0807   GFRNONAA >60 02/21/2023 1206   GFRAA 122 10/28/2020 0803    CBC    Component Value Date/Time   WBC 6.3 04/12/2023 0807   WBC 12.9 (H) 01/04/2023 1455   WBC 8.0 06/24/2022 1502   RBC 4.51 04/12/2023 0807   RBC 4.65 02/01/2023 0000   HGB 14.4 04/12/2023 0807   HCT 43.2 04/12/2023 0807   PLT 351 04/12/2023 0807   MCV 96 04/12/2023 0807   MCH 31.9 04/12/2023 0807   MCH 32.6 01/04/2023 1455   MCHC 33.3 04/12/2023 0807   MCHC 33.4 01/04/2023 1455   RDW 12.2 04/12/2023 0807   LYMPHSABS 2.0 04/12/2023 0807   MONOABS 0.8 01/04/2023 1455   EOSABS 0.5 (H) 04/12/2023 0807   BASOSABS 0.1 04/12/2023 4401    Assessment/Plan: Patient was counseled on the purpose, proper use, and adverse effects of hydroxychloroquine including nausea/diarrhea, skin rash, headaches, and sun sensitivity.  Advised patient to wear sunscreen once starting hydroxychloroquine to reduce risk of rash associated with sun sensitivity.  Discussed importance of annual eye exams while on hydroxychloroquine to monitor to ocular toxicity and discussed importance of frequent laboratory monitoring.  Provided patient with eye exam form for baseline ophthalmologic exam.  Provided patient with educational materials on  hydroxychloroquine and answered all questions.  Patient consented to hydroxychloroquine. Will upload consent in the media tab.    Reviewed risk for QTC prolongation when used in combination with other QTc prolonging agents (including but not limited to antiarrhythmics, macrolide antibiotics, flouroquinolone antibiotics, haloperidol, quetiapine, olanzapine, risperidone, droperidol, ziprasidone, amitriptyline, citalopram, ondansetron, migraine triptans, and methadone).  Dose will be Plaquenil 200 mg twice daily Monday through Friday.  Prescription sent to pharmacy today  Chesley Mires, PharmD, MPH, BCPS, CPP Clinical Pharmacist (Rheumatology and Pulmonology)

## 2023-08-05 ENCOUNTER — Other Ambulatory Visit: Payer: Self-pay

## 2023-08-05 ENCOUNTER — Ambulatory Visit (INDEPENDENT_AMBULATORY_CARE_PROVIDER_SITE_OTHER): Payer: Medicaid Other | Admitting: Family Medicine

## 2023-08-05 VITALS — BP 122/72 | HR 88 | Temp 98.2°F | Resp 16 | Ht 65.0 in | Wt 165.0 lb

## 2023-08-05 DIAGNOSIS — E782 Mixed hyperlipidemia: Secondary | ICD-10-CM

## 2023-08-05 DIAGNOSIS — I10 Essential (primary) hypertension: Secondary | ICD-10-CM

## 2023-08-05 DIAGNOSIS — R079 Chest pain, unspecified: Secondary | ICD-10-CM | POA: Diagnosis not present

## 2023-08-05 DIAGNOSIS — R7303 Prediabetes: Secondary | ICD-10-CM

## 2023-08-05 DIAGNOSIS — I201 Angina pectoris with documented spasm: Secondary | ICD-10-CM | POA: Diagnosis not present

## 2023-08-05 DIAGNOSIS — J441 Chronic obstructive pulmonary disease with (acute) exacerbation: Secondary | ICD-10-CM | POA: Diagnosis not present

## 2023-08-05 MED ORDER — IPRATROPIUM-ALBUTEROL 0.5-2.5 (3) MG/3ML IN SOLN: 3.0000 mL | Freq: Once | RESPIRATORY_TRACT | Status: DC

## 2023-08-05 MED ORDER — AIRSUPRA 90-80 MCG/ACT IN AERO: 1.0000 | INHALATION_SPRAY | Freq: Four times a day (QID) | RESPIRATORY_TRACT | Status: DC | PRN

## 2023-08-05 MED ORDER — ASPIRIN 81 MG PO TBEC: 81.0000 mg | DELAYED_RELEASE_TABLET | Freq: Every day | ORAL | Status: DC

## 2023-08-05 MED ORDER — AMOXICILLIN-POT CLAVULANATE 875-125 MG PO TABS: 1.0000 | ORAL_TABLET | Freq: Two times a day (BID) | ORAL | 0 refills | Status: AC

## 2023-08-05 MED ORDER — NITROGLYCERIN 0.4 MG SL SUBL: 0.4000 mg | SUBLINGUAL_TABLET | SUBLINGUAL | 3 refills | Status: DC | PRN

## 2023-08-05 NOTE — Assessment & Plan Note (Signed)
Sub acute Recurrent despite course of antibiotic (cefdinir) -Referral to ENT on 08/01/23 -Patient encouraged to stop smoking during infection.(Still smoking 1/2 pack/day) -Order chest X-ray STAT to rule out pneumonia. Results were negative  -Administer nebulizer treatment in office for wheezing -Perform pulmonary function tests (PFTs) pre and post nebulizer treatment. PFT improved and wheezing) -Continue Flonase, Singulair, and Advair -Airsupra sample given to patient to take instead of the albuterol inhaler (educated on the importance of rinsing after each use)

## 2023-08-05 NOTE — Assessment & Plan Note (Signed)
Recent episode  Intermittent chest pain at rest, previously evaluated by a cardiologist. No recent EKG at appointment in October. Last EKG done in July -Perform EKG in office today. NSR - vent. Rate 89, PR 156, QRS 70, QTc 445. Nonspecific ST and T wave abnormalities in lead 3 compared to last EKG. - Coordination of care with her Cardiologist office. - Christa See, MD reviewed the [facility] and was not concerned. No further testing at this time -Advised to take Aspirin 81 mg daily -refilled her nitroglycerin and advised the patient if she has severe chest pain that is not alleviated with nitroglycerin to go to the hospital

## 2023-08-05 NOTE — Progress Notes (Signed)
Established Patient Office Visit  Subjective   Patient ID: [name], female    DOB: 06-23-1966  Age: 57 y.o. MRN: [id number]  Chief Complaint  Patient presents with   COPD   Chest Pain        HPI The patient, with a history of vasospasms managed with amlodipine, presents with persistent sinus symptoms including sinus pain, cough, sore throat, and congestion. They have been using Flonase daily, but it has been drying out their nasal passages. They deny fever or chills, but report occasional hot flashes. They also report intermittent chest pain, described as a tightening sensation, which occurs at rest and has been previously evaluated by a cardiologist.  In addition to these symptoms, they report generalized muscle and joint aches, and have been seen by a rheumatologist for suspected rheumatoid arthritis. They also report occasional headaches, managed with Tylenol, and weakness. They have a history of smoking, but have cut back to half a pack a day. They use Advair and Proair inhalers for wheezing, but have not needed to use their albuterol inhaler recently.   Patient Active Problem List   Diagnosis Date Noted   Seasonal allergic rhinitis due to pollen 06/16/2023   Greater trochanteric bursitis of left hip 04/12/2023   Degenerative scoliosis in adult patient 02/28/2023   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   Dental caries 01/04/2023   History of blood transfusion 01/04/2023   Failure of dental implant due to infection 01/04/2023   Acute recurrent maxillary sinusitis 12/09/2022   Lumbar back pain 09/14/2022   Tobacco abuse 06/09/2022   Aortic atherosclerosis (HCC) 05/08/2022   Chronic midline low back pain without sciatica 01/22/2022   Myalgia due to statin 01/22/2022   GERD (gastroesophageal reflux disease) 01/22/2022   Overweight with body mass index (BMI) of 28 to 28.9 in adult 12/31/2021   COPD exacerbation (HCC) 11/22/2021   Acute recurrent frontal  sinusitis 10/20/2021   Prediabetes 10/20/2021   Migraine 10/20/2021   Primary hypertension 10/20/2021   COVID-19 virus infection 10/20/2021   Sore throat 10/01/2020   DDD (degenerative disc disease), lumbar 06/17/2020   Neuropathy 04/08/2020   Simple chronic bronchitis (HCC) 01/06/2020   Other fatigue 01/01/2020   Spontaneous bruising 01/01/2020   Cigarette nicotine dependence with nicotine-induced disorder 01/01/2020   Mixed hyperlipidemia 01/01/2020   GAD (generalized anxiety disorder) 01/01/2020   Smoking 06/05/2019   Prinzmetal angina (HCC) 06/05/2019   Primary osteoarthritis of one knee, right 02/09/2019   Pain of left hip joint 12/19/2018   Osteoarthritis of carpometacarpal (CMC) joint of thumb 11/20/2018   Past Medical History:  Diagnosis Date   Allergic rhinitis    Anginal pain (HCC)    Arthritis    Blood transfusion without reported diagnosis    Cellulitis, face 06/08/2022   Complication of anesthesia    COPD (chronic obstructive pulmonary disease) (HCC)    Dental abscess 06/09/2022   GERD (gastroesophageal reflux disease)    History of colon polyps    History of kidney stones    HTN (hypertension)    Hyperlipidemia    Kidney stones    Pancreatitis    Pneumonia    PONV (postoperative nausea and vomiting)    Primary hypertension 10/20/2021   Psychotic depression (HCC)    Shingles February 26th, 2001   Past Surgical History:  Procedure Laterality Date   ABDOMINAL HYSTERECTOMY     BACK SURGERY  02/2023   CARDIAC CATHETERIZATION  2008   CESAREAN SECTION  CHOLECYSTECTOMY     COLONOSCOPY  05/02/2017   Colonic polyp status post polypectomy. Small internal hemorrhoids   FRACTURE SURGERY Bilateral    [facility]ur   REPLACEMENT TOTAL KNEE Right 02/09/2019   TUBAL LIGATION     Social History   Tobacco Use   Smoking status: Every Day    Current packs/day: 1.00    Average packs/day: 1 pack/day for 20.0 years (20.0 ttl pk-yrs)    Types: Cigarettes    Passive  exposure: Past   Smokeless tobacco: Never  Vaping Use   Vaping status: Never Used  Substance Use Topics   Alcohol use: Yes    Comment: occas   Drug use: No   Social History   Socioeconomic History   Marital status: Widowed    Spouse name: Not on file   Number of children: 2   Years of education: Not on file   Highest education level: GED or equivalent  Occupational History   Not on file  Tobacco Use   Smoking status: Every Day    Current packs/day: 1.00    Average packs/day: 1 pack/day for 20.0 years (20.0 ttl pk-yrs)    Types: Cigarettes    Passive exposure: Past   Smokeless tobacco: Never  Vaping Use   Vaping status: Never Used  Substance and Sexual Activity   Alcohol use: Yes    Comment: occas   Drug use: No   Sexual activity: Not Currently  Other Topics Concern   Not on file  Social History Narrative   Not on file   Social Determinants of Health   Financial Resource Strain: Medium Risk (08/05/2023)   Overall Financial Resource Strain (CARDIA)    Difficulty of Paying Living Expenses: Somewhat hard  Food Insecurity: Food Insecurity Present (08/05/2023)   Hunger Vital Sign    Worried About Running Out of Food in the Last Year: Often true    Ran Out of Food in the Last Year: Sometimes true  Transportation Needs: No Transportation Needs (08/05/2023)   PRAPARE - Administrator, Civil Service (Medical): No    Lack of Transportation (Non-Medical): No  Physical Activity: Inactive (08/05/2023)   Exercise Vital Sign    Days of Exercise per Week: 0 days    Minutes of Exercise per Session: 0 min  Stress: No Stress Concern Present (08/05/2023)   [employer] of Occupational Health - Occupational Stress Questionnaire    Feeling of Stress : Only a little  Social Connections: Moderately Integrated (08/05/2023)   Social Connection and Isolation Panel [NHANES]    Frequency of Communication with Friends and Family: More than three times a week    Frequency  of Social Gatherings with Friends and Family: Twice a week    Attends Religious Services: More than 4 times per year    Active Member of [employer] or Organizations: Yes    Attends Banker Meetings: 1 to 4 times per year    Marital Status: Widowed  Intimate Partner Violence: Not At Risk (04/12/2023)   Humiliation, Afraid, Rape, and Kick questionnaire    Fear of Current or Ex-Partner: No    Emotionally Abused: No    Physically Abused: No    Sexually Abused: No   Family Status  Relation Name Status   Mother  Alive   Father  Deceased   Sister  Alive   Mat Aunt  (Not Specified)   Mat Education officer, community  Alive   MGF  Deceased   Son  Alive  Son  Optician, dispensing  (Not Specified)   Other  (Not Specified)  No partnership data on file   Family History  Problem Relation Age of Onset   Arthritis Mother    Lung disease Mother    Parkinson's disease Mother    COPD Father    Arthritis Father    Healthy Sister    Breast cancer Maternal Aunt        great aunt   Rheum arthritis Maternal Uncle    Rheum arthritis Maternal Grandfather    Healthy Son    High Cholesterol Son    Healthy Son    Colon cancer Cousin        mother's cousin   Diabetes Other    Stroke Other    Hypertension Other    Hyperlipidemia Other    Asthma Other    Heart failure Other    Thyroid disease Other    Heart attack Other    COPD Other    Arrhythmia Other    Arthritis Other    Migraines Other    Allergies  Allergen Reactions   Levofloxacin Other (See Comments) and Rash    Rash all over redness   Bactrim Ds [Sulfamethoxazole-Trimethoprim] Nausea Only   Lipitor [Atorvastatin] Other (See Comments)    Myalgia    Tape Hives   Zetia [Ezetimibe]     Muscle pain   Codeine Nausea Only   Other Rash    bandaids leave a red area if left on too long      Review of Systems  Constitutional:  Negative for chills, diaphoresis and fever.  HENT:  Positive for congestion, sore throat and tinnitus (left).   Eyes:   Positive for pain (left).  Respiratory:  Positive for cough and wheezing. Negative for shortness of breath.   Cardiovascular:  Positive for chest pain (@ rest).  Gastrointestinal:  Negative for abdominal pain, constipation, diarrhea, nausea and vomiting.  Genitourinary:  Negative for dysuria, frequency, hematuria and urgency.  Musculoskeletal:  Positive for joint pain (multiple joints).  Skin:  Negative for rash.  Neurological:  Positive for weakness and headaches (everyday). Negative for dizziness.  Psychiatric/Behavioral:  Negative for depression. The patient has insomnia (due to having to pee). The patient is not nervous/anxious.       Objective:     BP 122/72 (BP Location: Right Arm, Patient Position: Sitting, Cuff Size: Large)   Pulse 88   Temp 98.2 F (36.8 C)   Resp 16   Ht 5\' 5"  (1.651 m)   Wt 165 lb (74.8 kg)   LMP  (LMP Unknown) Comment: hysterectomy 2008  SpO2 97%   BMI 27.46 kg/m  BP Readings from Last 3 Encounters:  08/05/23 122/72  08/04/23 113/74  07/20/23 124/68   Wt Readings from Last 3 Encounters:  08/05/23 165 lb (74.8 kg)  08/04/23 165 lb 6.4 oz (75 kg)  07/20/23 163 lb (73.9 kg)      Physical Exam Constitutional:      General: She is not in acute distress.    Appearance: She is ill-appearing.  HENT:     Head: Normocephalic.     Right Ear: Tympanic membrane normal.     Left Ear: Tenderness present. A middle ear effusion is present.     Nose: Congestion present.     Right Sinus: Frontal sinus tenderness present.     Left Sinus: Frontal sinus tenderness present.     Mouth/Throat:     Mouth: Mucous membranes  are moist.     Pharynx: No oropharyngeal exudate.     Tonsils: No tonsillar exudate.  Eyes:     Conjunctiva/sclera: Conjunctivae normal.  Cardiovascular:     Rate and Rhythm: Normal rate and regular rhythm.     Heart sounds: Normal heart sounds.  Pulmonary:     Effort: Pulmonary effort is normal.     Breath sounds: Wheezing and rhonchi  present.  Chest:     Chest wall: No tenderness.  Musculoskeletal:     Cervical back: Normal range of motion.     Right lower leg: No edema.     Left lower leg: No edema.  Skin:    General: Skin is warm.  Neurological:     Mental Status: She is alert. Mental status is at baseline.  Psychiatric:        Mood and Affect: Mood normal.        Behavior: Behavior normal.        Thought Content: Thought content normal.        Judgment: Judgment normal.      No results found for any visits on 08/05/23.  Last CBC Lab Results  Component Value Date   WBC 6.3 04/12/2023   HGB 14.4 04/12/2023   HCT 43.2 04/12/2023   MCV 96 04/12/2023   MCH 31.9 04/12/2023   RDW 12.2 04/12/2023   PLT 351 04/12/2023   Last metabolic panel Lab Results  Component Value Date   GLUCOSE 85 04/12/2023   NA 143 04/12/2023   K 5.0 04/12/2023   CL 101 04/12/2023   CO2 24 04/12/2023   BUN 8 04/12/2023   CREATININE 0.53 (L) 04/12/2023   EGFR 108 04/12/2023   CALCIUM 10.0 04/12/2023   PROT 6.6 04/12/2023   ALBUMIN 4.4 04/12/2023   LABGLOB 2.2 04/12/2023   AGRATIO 2.0 12/17/2022   BILITOT <0.2 04/12/2023   ALKPHOS 122 (H) 04/12/2023   AST 18 04/12/2023   ALT 20 04/12/2023   ANIONGAP 9 02/21/2023   Last lipids Lab Results  Component Value Date   CHOL 185 04/12/2023   HDL 48 04/12/2023   LDLCALC 103 (H) 04/12/2023   TRIG 200 (H) 04/12/2023   CHOLHDL 3.9 04/12/2023   Last hemoglobin A1c Lab Results  Component Value Date   HGBA1C 5.2 04/12/2023   Last thyroid functions Lab Results  Component Value Date   TSH 1.690 09/09/2022   T4TOTAL 8.8 07/13/2007   Last vitamin D Lab Results  Component Value Date   VD25OH 56.0 12/17/2022   Last vitamin B12 and Folate No results found for: "VITAMINB12", "FOLATE"    The 10-year ASCVD risk score (Arnett DK, et al., 2019) is: 7%    Assessment & Plan:   Problem List Items Addressed This Visit       Cardiovascular and Mediastinum   Prinzmetal  angina (HCC)    Recent episode  Intermittent chest pain at rest, previously evaluated by a cardiologist. No recent EKG at appointment in October. Last EKG done in July -Perform EKG in office today. NSR - vent. Rate 89, PR 156, QRS 70, QTc 445. Nonspecific ST and T wave abnormalities in lead 3 compared to last EKG. - Coordination of care with her Cardiologist office. - Christa See, MD reviewed the [facility] and was not concerned. No further testing at this time -Advised to take Aspirin 81 mg daily -refilled her nitroglycerin and advised the patient if she has severe chest pain that is not alleviated with nitroglycerin to go  to the hospital       Relevant Medications   aspirin EC 81 MG tablet   nitroGLYCERIN (NITROSTAT) 0.4 MG SL tablet   Other Relevant Orders   DG Chest 2 View (Completed)   EKG 12-Lead (Completed)   Primary hypertension    Well controlled. 120/72 No changes to medicines. Amlodipine 10 mg daily, furosemide 20 mg as needed Labs drawn today.        Relevant Medications   aspirin EC 81 MG tablet   nitroGLYCERIN (NITROSTAT) 0.4 MG SL tablet   Other Relevant Orders   Comprehensive metabolic panel   CBC with Differential     Respiratory   COPD exacerbation (HCC) - Primary    Sub acute Recurrent despite course of antibiotic (cefdinir) -Referral to ENT on 08/01/23 -Patient encouraged to stop smoking during infection.(Still smoking 1/2 pack/day) -Order chest X-ray STAT to rule out pneumonia. Results were negative  -Administer nebulizer treatment in office for wheezing -Perform pulmonary function tests (PFTs) pre and post nebulizer treatment. PFT improved and wheezing) -Continue Flonase, Singulair, and Advair -Airsupra sample given to patient to take instead of the albuterol inhaler (educated on the importance of rinsing after each use)      Relevant Medications   ipratropium-albuterol (DUONEB) 0.5-2.5 (3) MG/3ML nebulizer solution 3 mL   amoxicillin-clavulanate  (AUGMENTIN) 875-125 MG tablet   Albuterol-Budesonide (AIRSUPRA) 90-80 MCG/ACT AERO   Other Relevant Orders   Peak flow (Completed)     Other   Mixed hyperlipidemia    Well controlled.  No changes to medicines. Repatha - Inject 140 mg as directed every 14 (fourteen) days.  Continue to work on eating a healthy diet and exercise as tolerated Labs drawn today.        Relevant Medications   aspirin EC 81 MG tablet   nitroGLYCERIN (NITROSTAT) 0.4 MG SL tablet   Other Relevant Orders   Lipid Panel   TSH   T4, Free   Prediabetes    Well controlled. Last A1C - 5.2% improving No changes to medicines. Ozempic 2 mg injection SQ weekly Continue to work on eating a healthy diet and exercise as tolerated Labs drawn today.        Relevant Orders   Hemoglobin A1c    Follow-up: Return in about 6 days (around 08/11/2023) for chronic.   Total time spent on today's visit was greater than 40 minutes, including both face-to-face time and nonface-to-face time personally spent on review of chart (labs and imaging), discussing labs and goals, discussing further work-up, treatment options, referrals to specialist if needed, reviewing outside records if pertinent, answering patient's questions, and coordinating care.   Lajuana Matte, FNP [facility] [phone]

## 2023-08-05 NOTE — Assessment & Plan Note (Signed)
Well controlled.  No changes to medicines. Repatha - Inject 140 mg as directed every 14 (fourteen) days.  Continue to work on eating a healthy diet and exercise as tolerated Labs drawn today.

## 2023-08-05 NOTE — Assessment & Plan Note (Signed)
Well controlled. Last A1C - 5.2% improving No changes to medicines. Ozempic 2 mg injection SQ weekly Continue to work on eating a healthy diet and exercise as tolerated Labs drawn today.

## 2023-08-05 NOTE — Assessment & Plan Note (Signed)
Well controlled. 120/72 No changes to medicines. Amlodipine 10 mg daily, furosemide 20 mg as needed Labs drawn today.

## 2023-08-06 LAB — COMPREHENSIVE METABOLIC PANEL
ALT: 23 [IU]/L (ref 0–32)
AST: 17 [IU]/L (ref 0–40)
Albumin: 4.7 g/dL (ref 3.8–4.9)
Alkaline Phosphatase: 114 [IU]/L (ref 44–121)
BUN/Creatinine Ratio: 24 — ABNORMAL HIGH (ref 9–23)
BUN: 14 mg/dL (ref 6–24)
Bilirubin Total: <0.2 mg/dL (ref 0.0–1.2)
CO2: 23 mmol/L (ref 20–29)
Calcium: 10.1 mg/dL (ref 8.7–10.2)
Chloride: 100 mmol/L (ref 96–106)
Creatinine, Ser: 0.58 mg/dL (ref 0.57–1.00)
Globulin, Total: 2.4 g/dL (ref 1.5–4.5)
Glucose: 92 mg/dL (ref 70–99)
Potassium: 4.6 mmol/L (ref 3.5–5.2)
Sodium: 140 mmol/L (ref 134–144)
Total Protein: 7.1 g/dL (ref 6.0–8.5)
eGFR: 105 mL/min/{1.73_m2} (ref >59)

## 2023-08-06 LAB — CBC WITH DIFFERENTIAL/PLATELET
Basophils Absolute: 0 10*3/uL (ref 0.0–0.2)
Basos: 0 %
EOS (ABSOLUTE): 0 10*3/uL (ref 0.0–0.4)
Eos: 0 %
Hematocrit: 43.4 % (ref 34.0–46.6)
Hemoglobin: 14.2 g/dL (ref 11.1–15.9)
Immature Grans (Abs): 0 10*3/uL (ref 0.0–0.1)
Immature Granulocytes: 0 %
Lymphocytes Absolute: 1.9 10*3/uL (ref 0.7–3.1)
Lymphs: 16 %
MCH: 30.8 pg (ref 26.6–33.0)
MCHC: 32.7 g/dL (ref 31.5–35.7)
MCV: 94 fL (ref 79–97)
Monocytes Absolute: 0.7 10*3/uL (ref 0.1–0.9)
Monocytes: 6 %
Neutrophils Absolute: 9.4 10*3/uL — ABNORMAL HIGH (ref 1.4–7.0)
Neutrophils: 78 %
Platelets: 427 10*3/uL (ref 150–450)
RBC: 4.61 x10E6/uL (ref 3.77–5.28)
RDW: 12 % (ref 11.7–15.4)
WBC: 12.1 10*3/uL — ABNORMAL HIGH (ref 3.4–10.8)

## 2023-08-06 LAB — T4, FREE: Free T4: 1.14 ng/dL (ref 0.82–1.77)

## 2023-08-06 LAB — HEMOGLOBIN A1C
Est. average glucose Bld gHb Est-mCnc: 108 mg/dL
Hgb A1c MFr Bld: 5.4 % (ref 4.8–5.6)

## 2023-08-06 LAB — LIPID PANEL
Chol/HDL Ratio: 2.4 ratio (ref 0.0–4.4)
Cholesterol, Total: 187 mg/dL (ref 100–199)
HDL: 79 mg/dL (ref >39)
LDL Chol Calc (NIH): 83 mg/dL (ref 0–99)
Triglycerides: 151 mg/dL — ABNORMAL HIGH (ref 0–149)
VLDL Cholesterol Cal: 25 mg/dL (ref 5–40)

## 2023-08-06 LAB — TSH: TSH: 0.955 u[IU]/mL (ref 0.450–4.500)

## 2023-08-08 ENCOUNTER — Other Ambulatory Visit: Payer: Self-pay

## 2023-08-08 DIAGNOSIS — F17219 Nicotine dependence, cigarettes, with unspecified nicotine-induced disorders: Secondary | ICD-10-CM

## 2023-08-11 ENCOUNTER — Ambulatory Visit: Payer: Medicaid Other | Admitting: Family Medicine

## 2023-08-12 DIAGNOSIS — M19021 Primary osteoarthritis, right elbow: Secondary | ICD-10-CM | POA: Diagnosis not present

## 2023-08-12 DIAGNOSIS — M19022 Primary osteoarthritis, left elbow: Secondary | ICD-10-CM | POA: Diagnosis not present

## 2023-08-15 ENCOUNTER — Telehealth: Payer: Self-pay

## 2023-08-15 ENCOUNTER — Other Ambulatory Visit (HOSPITAL_COMMUNITY): Payer: Self-pay

## 2023-08-15 NOTE — Telephone Encounter (Signed)
PT. WAS ON SPEADSHEET FOR 2025 RE-ENROLLMENT. APPLIED ELECTRONICALLY AND WAS DENIED DO TO HAVING MEDICAID.  I HAVE INFORMED PT. THROUGH MYCHART AND RAN A TEST CLAIM AND LET HER KNOW THERE IS A $4 CO-PAY

## 2023-08-15 NOTE — Progress Notes (Signed)
Pharmacy Medication Assistance Program Note    08/15/2023  Patient ID: [name], female   DOB: 06/16/1966, 57 y.o.   MRN: [id number]     08/15/2023  Outreach Medication One  Manufacturer Medication One [employer] Drugs Ozempic  Dose of Ozempic 2MG /WEEK  Type of Sport and exercise psychologist  Patient Assistance Determination Denied  Patient Denial Reasons Other  Patient Notification Method MyChart    Pt. Denied due to having medicaide   Signature Tresea Mall, CPHT [facility]

## 2023-08-16 DIAGNOSIS — Z79899 Other long term (current) drug therapy: Secondary | ICD-10-CM | POA: Diagnosis not present

## 2023-08-17 DIAGNOSIS — J0191 Acute recurrent sinusitis, unspecified: Secondary | ICD-10-CM | POA: Diagnosis not present

## 2023-08-17 DIAGNOSIS — H9312 Tinnitus, left ear: Secondary | ICD-10-CM | POA: Diagnosis not present

## 2023-08-22 ENCOUNTER — Other Ambulatory Visit: Payer: Self-pay | Admitting: Family Medicine

## 2023-08-22 DIAGNOSIS — K219 Gastro-esophageal reflux disease without esophagitis: Secondary | ICD-10-CM

## 2023-08-23 ENCOUNTER — Other Ambulatory Visit: Payer: Self-pay | Admitting: Family Medicine

## 2023-08-23 DIAGNOSIS — J301 Allergic rhinitis due to pollen: Secondary | ICD-10-CM

## 2023-08-26 ENCOUNTER — Other Ambulatory Visit: Payer: Self-pay

## 2023-08-26 DIAGNOSIS — J309 Allergic rhinitis, unspecified: Secondary | ICD-10-CM

## 2023-08-28 DIAGNOSIS — M25521 Pain in right elbow: Secondary | ICD-10-CM | POA: Insufficient documentation

## 2023-08-29 DIAGNOSIS — J0191 Acute recurrent sinusitis, unspecified: Secondary | ICD-10-CM | POA: Diagnosis not present

## 2023-09-05 ENCOUNTER — Encounter: Payer: Self-pay | Admitting: Family Medicine

## 2023-09-05 ENCOUNTER — Ambulatory Visit: Payer: Medicaid Other | Admitting: Family Medicine

## 2023-09-05 VITALS — BP 128/74 | HR 82 | Temp 97.1°F | Ht 65.0 in | Wt 174.0 lb

## 2023-09-05 DIAGNOSIS — J029 Acute pharyngitis, unspecified: Secondary | ICD-10-CM

## 2023-09-05 DIAGNOSIS — H9313 Tinnitus, bilateral: Secondary | ICD-10-CM | POA: Diagnosis not present

## 2023-09-05 DIAGNOSIS — F17219 Nicotine dependence, cigarettes, with unspecified nicotine-induced disorders: Secondary | ICD-10-CM

## 2023-09-05 DIAGNOSIS — J301 Allergic rhinitis due to pollen: Secondary | ICD-10-CM

## 2023-09-05 DIAGNOSIS — J410 Simple chronic bronchitis: Secondary | ICD-10-CM | POA: Diagnosis not present

## 2023-09-05 LAB — POCT RAPID STREP A (OFFICE): Rapid Strep A Screen: NEGATIVE

## 2023-09-05 MED ORDER — AZELASTINE HCL 0.1 % NA SOLN: 2.0000 | Freq: Two times a day (BID) | NASAL | 12 refills | Status: DC

## 2023-09-05 NOTE — Progress Notes (Unsigned)
Acute Office Visit  Subjective:    Patient ID: [name], female    DOB: 1966-02-21, 57 y.o.   MRN: [id number]  No chief complaint on file.   Discussed the use of AI scribe software for clinical note transcription with the patient, who gave verbal consent to proceed.  HPI: Patient is in today for congestion and cough x 1 week, Sore throat all the time and headache that comes and goes. Tylenol and alka seltzer helps until medication wears off.   Past Medical History:  Diagnosis Date  . Allergic rhinitis   . Anginal pain (HCC)   . Arthritis   . Blood transfusion without reported diagnosis   . Cellulitis, face 06/08/2022  . Complication of anesthesia   . COPD (chronic obstructive pulmonary disease) (HCC)   . Dental abscess 06/09/2022  . GERD (gastroesophageal reflux disease)   . History of colon polyps   . History of kidney stones   . HTN (hypertension)   . Hyperlipidemia   . Kidney stones   . Pancreatitis   . Pneumonia   . PONV (postoperative nausea and vomiting)   . Primary hypertension 10/20/2021  . Psychotic depression (HCC)   . Shingles February 26th, 2001    Past Surgical History:  Procedure Laterality Date  . ABDOMINAL HYSTERECTOMY    . BACK SURGERY  02/2023  . CARDIAC CATHETERIZATION  2008  . CESAREAN SECTION    . CHOLECYSTECTOMY    . COLONOSCOPY  05/02/2017   Colonic polyp status post polypectomy. Small internal hemorrhoids  . FRACTURE SURGERY Bilateral    femur  . REPLACEMENT TOTAL KNEE Right 02/09/2019  . TUBAL LIGATION      Family History  Problem Relation Age of Onset  . Arthritis Mother   . Lung disease Mother   . Parkinson's disease Mother   . COPD Father   . Arthritis Father   . Healthy Sister   . Breast cancer Maternal Aunt        great aunt  . Rheum arthritis Maternal Uncle   . Rheum arthritis Maternal Grandfather   . Healthy Son   . High Cholesterol Son   . Healthy Son   . Colon cancer Cousin        mother's cousin  .  Diabetes Other   . Stroke Other   . Hypertension Other   . Hyperlipidemia Other   . Asthma Other   . Heart failure Other   . Thyroid disease Other   . Heart attack Other   . COPD Other   . Arrhythmia Other   . Arthritis Other   . Migraines Other     Social History   Socioeconomic History  . Marital status: Widowed    Spouse name: Not on file  . Number of children: 2  . Years of education: Not on file  . Highest education level: GED or equivalent  Occupational History  . Not on file  Tobacco Use  . Smoking status: Every Day    Current packs/day: 1.00    Average packs/day: 1 pack/day for 20.0 years (20.0 ttl pk-yrs)    Types: Cigarettes    Passive exposure: Past  . Smokeless tobacco: Never  Vaping Use  . Vaping status: Never Used  Substance and Sexual Activity  . Alcohol use: Yes    Comment: occas  . Drug use: No  . Sexual activity: Not Currently  Other Topics Concern  . Not on file  Social History Narrative  . Not  on file   Social Drivers of Health   Financial Resource Strain: Medium Risk (08/05/2023)   Overall Financial Resource Strain (CARDIA)   . Difficulty of Paying Living Expenses: Somewhat hard  Food Insecurity: Food Insecurity Present (08/05/2023)   Hunger Vital Sign   . Worried About Programme researcher, broadcasting/film/video in the Last Year: Often true   . Ran Out of Food in the Last Year: Sometimes true  Transportation Needs: No Transportation Needs (08/05/2023)   PRAPARE - Transportation   . Lack of Transportation (Medical): No   . Lack of Transportation (Non-Medical): No  Physical Activity: Inactive (08/05/2023)   Exercise Vital Sign   . Days of Exercise per Week: 0 days   . Minutes of Exercise per Session: 0 min  Stress: No Stress Concern Present (08/05/2023)   [employer] of Occupational Health - Occupational Stress Questionnaire   . Feeling of Stress : Only a little  Social Connections: Moderately Integrated (08/05/2023)   Social Connection and Isolation  Panel [NHANES]   . Frequency of Communication with Friends and Family: More than three times a week   . Frequency of Social Gatherings with Friends and Family: Twice a week   . Attends Religious Services: More than 4 times per year   . Active Member of Clubs or Organizations: Yes   . Attends Banker Meetings: 1 to 4 times per year   . Marital Status: Widowed  Intimate Partner Violence: Not At Risk (04/12/2023)   Humiliation, Afraid, Rape, and Kick questionnaire   . Fear of Current or Ex-Partner: No   . Emotionally Abused: No   . Physically Abused: No   . Sexually Abused: No    Outpatient Medications Prior to Visit  Medication Sig Dispense Refill  . acetaminophen (TYLENOL) 650 MG CR tablet Take 650-1,300 mg by mouth every 8 (eight) hours as needed for pain.    Marland Kitchen albuterol (VENTOLIN HFA) 108 (90 Base) MCG/ACT inhaler INHALE 2 PUFFS INTO LUNGS EVERY 6 TO 8 HOURS AS NEEDED FOR WHEEZING 18 g 1  . Albuterol-Budesonide (AIRSUPRA) 90-80 MCG/ACT AERO Inhale 1 puff into the lungs every 6 (six) hours as needed.    . ALPRAZolam (XANAX) 0.25 MG tablet TAKE 1 TABLET(0.25 MG) BY MOUTH DAILY AS NEEDED FOR ANXIETY 30 tablet 3  . amLODipine (NORVASC) 10 MG tablet TAKE 1 TABLET BY MOUTH DAILY 90 tablet 0  . aspirin EC 81 MG tablet Take 1 tablet (81 mg total) by mouth daily. Swallow whole.    Marland Kitchen buPROPion (WELLBUTRIN XL) 300 MG 24 hr tablet TAKE 1 TABLET(300 MG) BY MOUTH DAILY. 90 tablet 1  . cyclobenzaprine (FLEXERIL) 10 MG tablet TAKE 1 TABLET(10 MG) BY MOUTH THREE TIMES DAILY AS NEEDED FOR MUSCLE SPASMS 270 tablet 0  . dicyclomine (BENTYL) 10 MG capsule TAKE 1 CAPSULE(10 MG) BY MOUTH FOUR TIMES DAILY BEFORE MEALS AND AT BEDTIME 120 capsule 2  . Docusate Sodium (DSS) 100 MG CAPS Take 100 mg by mouth daily as needed (constipation).    . Evolocumab (REPATHA SURECLICK) 140 MG/ML SOAJ Inject 140 mg as directed every 14 (fourteen) days. 2 mL 2  . famotidine (PEPCID) 40 MG tablet Take 1 tablet (40 mg  total) by mouth daily as needed for heartburn or indigestion. 90 tablet 1  . fluticasone (FLONASE) 50 MCG/ACT nasal spray SHAKE LIQUID AND USE 2 SPRAYS IN EACH NOSTRIL DAILY 16 g 6  . fluticasone-salmeterol (ADVAIR HFA) 115-21 MCG/ACT inhaler Inhale 2 puffs into the lungs 2 (two)  times daily. (Patient taking differently: Inhale 2 puffs into the lungs 2 (two) times daily as needed (asthma).) 3 each 3  . furosemide (LASIX) 20 MG tablet Take 1 tablet (20 mg total) by mouth daily as needed for edema. 1 tablet 0  . gabapentin (NEURONTIN) 300 MG capsule Take 300 mg by mouth 3 (three) times daily.    Marland Kitchen HYDROcodone-acetaminophen (NORCO/VICODIN) 5-325 MG tablet Take 1-2 tablets by mouth every 6 (six) hours as needed.    . hydroxychloroquine (PLAQUENIL) 200 MG tablet Take one tablet by mouth twice daily MONDAYS THROUGH FRIDAYS ONLY. Do not take on Saturday and Sunday 120 tablet 0  . levocetirizine (XYZAL) 5 MG tablet TAKE 1 TABLET(5 MG) BY MOUTH EVERY EVENING 90 tablet 0  . meloxicam (MOBIC) 15 MG tablet Take 15 mg by mouth daily.    . montelukast (SINGULAIR) 10 MG tablet TAKE 1 TABLET BY MOUTH AT BEDTIME 90 tablet 0  . nicotine (NICODERM CQ - DOSED IN MG/24 HOURS) 21 mg/24hr patch APPLY 1 PATCH(21 MG) TOPICALLY TO THE SKIN DAILY 28 patch 0  . nitroGLYCERIN (NITROSTAT) 0.4 MG SL tablet DISSOLVE 1 TABLET BY MOUTH UNDER THE TONGUE AS NEEDED FOR CHEST PAIN EVERY 5 MINUTES UP TO 3 TIMES 25 tablet 0  . nitroGLYCERIN (NITROSTAT) 0.4 MG SL tablet Place 1 tablet (0.4 mg total) under the tongue every 5 (five) minutes as needed for chest pain. 50 tablet 3  . omeprazole (PRILOSEC) 40 MG capsule TAKE 1 CAPSULE(40 MG) BY MOUTH DAILY 90 capsule 3  . OZEMPIC, 2 MG/DOSE, 8 MG/3ML SOPN INJECT 2 MG AS DIRECTED ONCE A WEEK 9 mL 0  . triamcinolone (KENALOG) 0.025 % ointment Apply 1 Application topically 2 (two) times daily. Apply to corners of mouth twice daily 30 g 0   Facility-Administered Medications Prior to Visit   Medication Dose Route Frequency Provider Last Rate Last Admin  . ipratropium-albuterol (DUONEB) 0.5-2.5 (3) MG/3ML nebulizer solution 3 mL  3 mL Nebulization Once         Allergies  Allergen Reactions  . Levofloxacin Other (See Comments) and Rash    Rash all over redness  . Bactrim Ds [Sulfamethoxazole-Trimethoprim] Nausea Only  . Lipitor [Atorvastatin] Other (See Comments)    Myalgia   . Tape Hives  . Zetia [Ezetimibe]     Muscle pain  . Codeine Nausea Only  . Other Rash    bandaids leave a red area if left on too long    Review of Systems  Constitutional:  Negative for chills, fatigue and fever.  HENT:  Negative for congestion, ear pain, postnasal drip, rhinorrhea, sinus pressure, sinus pain and sore throat.   Respiratory:  Negative for cough and shortness of breath.   Cardiovascular:  Negative for chest pain.  Gastrointestinal:  Negative for diarrhea, nausea and vomiting.  Neurological:  Negative for dizziness and headaches.       Objective:        11 /15/2024    7:46 AM 08/04/2023   11:18 AM 07/20/2023   10:56 AM  Vitals with BMI  Height 5\' 5"  5\' 5"  5\' 6"   Weight 165 lbs 165 lbs 6 oz 163 lbs  BMI 27.46 27.52 26.32  Systolic 122 113 784  Diastolic 72 74 68  Pulse 88 81 77    No data found.   Physical Exam Vitals reviewed.  Constitutional:      Appearance: Normal appearance. She is normal weight.  HENT:     Right Ear: Tympanic membrane, ear  canal and external ear normal.     Left Ear: Tympanic membrane and external ear normal.     Nose: Nose normal.     Mouth/Throat:     Pharynx: Oropharynx is clear.  Cardiovascular:     Rate and Rhythm: Normal rate and regular rhythm.     Pulses: Normal pulses.     Heart sounds: Normal heart sounds. No murmur heard. Pulmonary:     Effort: Pulmonary effort is normal. No respiratory distress.     Breath sounds: Normal breath sounds.  Abdominal:     General: Abdomen is flat. Bowel sounds are normal.     Palpations:  Abdomen is soft.     Tenderness: There is no abdominal tenderness.  Neurological:     Mental Status: She is alert and oriented to person, place, and time.  Psychiatric:        Mood and Affect: Mood normal.        Behavior: Behavior normal.    Health Maintenance Due  Topic Date Due  . Cervical Cancer Screening (HPV/Pap Cotest)  04/06/2019  . Lung Cancer Screening  12/31/2022    There are no preventive care reminders to display for this patient.   Lab Results  Component Value Date   TSH 0.955 08/05/2023   Lab Results  Component Value Date   WBC 12.1 (H) 08/05/2023   HGB 14.2 08/05/2023   HCT 43.4 08/05/2023   MCV 94 08/05/2023   PLT 427 08/05/2023   Lab Results  Component Value Date   NA 140 08/05/2023   K 4.6 08/05/2023   CO2 23 08/05/2023   GLUCOSE 92 08/05/2023   BUN 14 08/05/2023   CREATININE 0.58 08/05/2023   BILITOT <0.2 08/05/2023   ALKPHOS 114 08/05/2023   AST 17 08/05/2023   ALT 23 08/05/2023   PROT 7.1 08/05/2023   ALBUMIN 4.7 08/05/2023   CALCIUM 10.1 08/05/2023   ANIONGAP 9 02/21/2023   EGFR 105 08/05/2023   Lab Results  Component Value Date   CHOL 187 08/05/2023   Lab Results  Component Value Date   HDL 79 08/05/2023   Lab Results  Component Value Date   LDLCALC 83 08/05/2023   Lab Results  Component Value Date   TRIG 151 (H) 08/05/2023   Lab Results  Component Value Date   CHOLHDL 2.4 08/05/2023   Lab Results  Component Value Date   HGBA1C 5.4 08/05/2023       Assessment & Plan:  There are no diagnoses linked to this encounter.   No orders of the defined types were placed in this encounter.   No orders of the defined types were placed in this encounter.    Follow-up: No follow-ups on file.  An After Visit Summary was printed and given to the patient.  Blane Ohara, MD [facility] [phone]

## 2023-09-06 NOTE — Progress Notes (Deleted)
Office Visit Note  Patient: [name]             Date of Birth: 22-Apr-1966           MRN: [id number]             PCP: Blane Ohara, MD Referring: Blane Ohara, MD Visit Date: 09/20/2023 Occupation: @GUAROCC @  Subjective:    History of Present Illness: TYJAH MULCARE is a 57 y.o. female with history of seronegative rheumatoid arthritis and osteoarthritis.  Patient remains on  Plaquenil 200 mg 1 tablet twice daily M-F   No baseline plaquenil eye examination on file CBC and CMP updated on 08/05/23.    Activities of Daily Living:  Patient reports morning stiffness for *** {minute/hour:19697}.   Patient {ACTIONS;DENIES/REPORTS:21021675::"Denies"} nocturnal pain.  Difficulty dressing/grooming: {ACTIONS;DENIES/REPORTS:21021675::"Denies"} Difficulty climbing stairs: {ACTIONS;DENIES/REPORTS:21021675::"Denies"} Difficulty getting out of chair: {ACTIONS;DENIES/REPORTS:21021675::"Denies"} Difficulty using hands for taps, buttons, cutlery, and/or writing: {ACTIONS;DENIES/REPORTS:21021675::"Denies"}  No Rheumatology ROS completed.   PMFS History:  Patient Active Problem List   Diagnosis Date Noted   Seasonal allergic rhinitis due to pollen 06/16/2023   Greater trochanteric bursitis of left hip 04/12/2023   Degenerative scoliosis in adult patient 02/28/2023   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   Dental caries 01/04/2023   History of blood transfusion 01/04/2023   Failure of dental implant due to infection 01/04/2023   Acute recurrent maxillary sinusitis 12/09/2022   Lumbar back pain 09/14/2022   Tobacco abuse 06/09/2022   Aortic atherosclerosis (HCC) 05/08/2022   Chronic midline low back pain without sciatica 01/22/2022   Myalgia due to statin 01/22/2022   GERD (gastroesophageal reflux disease) 01/22/2022   Overweight with body mass index (BMI) of 28 to 28.9 in adult 12/31/2021   COPD exacerbation (HCC) 11/22/2021   Acute recurrent frontal sinusitis  10/20/2021   Prediabetes 10/20/2021   Migraine 10/20/2021   Primary hypertension 10/20/2021   COVID-19 virus infection 10/20/2021   Sore throat 10/01/2020   DDD (degenerative disc disease), lumbar 06/17/2020   Neuropathy 04/08/2020   Simple chronic bronchitis (HCC) 01/06/2020   Other fatigue 01/01/2020   Spontaneous bruising 01/01/2020   Cigarette nicotine dependence with nicotine-induced disorder 01/01/2020   Mixed hyperlipidemia 01/01/2020   GAD (generalized anxiety disorder) 01/01/2020   Smoking 06/05/2019   Prinzmetal angina (HCC) 06/05/2019   Primary osteoarthritis of one knee, right 02/09/2019   Pain of left hip joint 12/19/2018   Osteoarthritis of carpometacarpal (CMC) joint of thumb 11/20/2018    Past Medical History:  Diagnosis Date   Allergic rhinitis    Anginal pain (HCC)    Arthritis    Blood transfusion without reported diagnosis    Cellulitis, face 06/08/2022   Complication of anesthesia    COPD (chronic obstructive pulmonary disease) (HCC)    Dental abscess 06/09/2022   GERD (gastroesophageal reflux disease)    History of colon polyps    History of kidney stones    HTN (hypertension)    Hyperlipidemia    Kidney stones    Pancreatitis    Pneumonia    PONV (postoperative nausea and vomiting)    Primary hypertension 10/20/2021   Psychotic depression (HCC)    Shingles February 26th, 2001    Family History  Problem Relation Age of Onset   Arthritis Mother    Lung disease Mother    Parkinson's disease Mother    COPD Father    Arthritis Father    Healthy Sister    Breast cancer Maternal Aunt  great aunt   Rheum arthritis Maternal Uncle    Rheum arthritis Maternal Grandfather    Healthy Son    High Cholesterol Son    Healthy Son    Colon cancer Cousin        mother's cousin   Diabetes Other    Stroke Other    Hypertension Other    Hyperlipidemia Other    Asthma Other    Heart failure Other    Thyroid disease Other    Heart attack Other     COPD Other    Arrhythmia Other    Arthritis Other    Migraines Other    Past Surgical History:  Procedure Laterality Date   ABDOMINAL HYSTERECTOMY     BACK SURGERY  02/2023   CARDIAC CATHETERIZATION  2008   CESAREAN SECTION     CHOLECYSTECTOMY     COLONOSCOPY  05/02/2017   Colonic polyp status post polypectomy. Small internal hemorrhoids   FRACTURE SURGERY Bilateral    femur   REPLACEMENT TOTAL KNEE Right 02/09/2019   TUBAL LIGATION     Social History   Social History Narrative   Not on file   Immunization History  Administered Date(s) Administered   Influenza,inj,Quad PF,6+ Mos 06/27/2018   Influenza-Unspecified 07/08/2017, 08/09/2022   PNEUMOCOCCAL CONJUGATE-20 05/06/2022   Tdap 12/01/2021   Zoster Recombinant(Shingrix) 09/14/2017, 05/05/2018     Objective: Vital Signs: LMP  (LMP Unknown) Comment: hysterectomy 2008   Physical Exam Vitals and nursing note reviewed.  Constitutional:      Appearance: She is well-developed.  HENT:     Head: Normocephalic and atraumatic.  Eyes:     Conjunctiva/sclera: Conjunctivae normal.  Cardiovascular:     Rate and Rhythm: Normal rate and regular rhythm.     Heart sounds: Normal heart sounds.  Pulmonary:     Effort: Pulmonary effort is normal.     Breath sounds: Normal breath sounds.  Abdominal:     General: Bowel sounds are normal.     Palpations: Abdomen is soft.  Musculoskeletal:     Cervical back: Normal range of motion.  Lymphadenopathy:     Cervical: No cervical adenopathy.  Skin:    General: Skin is warm and dry.     Capillary Refill: Capillary refill takes less than 2 seconds.  Neurological:     Mental Status: She is alert and oriented to person, place, and time.  Psychiatric:        Behavior: Behavior normal.      Musculoskeletal Exam: ***  CDAI Exam: CDAI Score: -- Patient Global: --; Provider Global: -- Swollen: --; Tender: -- Joint Exam 09/20/2023   No joint exam has been documented for this  visit   There is currently no information documented on the homunculus. Go to the Rheumatology activity and complete the homunculus joint exam.  Investigation: No additional findings.  Imaging: No results found.  Recent Labs: Lab Results  Component Value Date   WBC 12.1 (H) 08/05/2023   HGB 14.2 08/05/2023   PLT 427 08/05/2023   NA 140 08/05/2023   K 4.6 08/05/2023   CL 100 08/05/2023   CO2 23 08/05/2023   GLUCOSE 92 08/05/2023   BUN 14 08/05/2023   CREATININE 0.58 08/05/2023   BILITOT <0.2 08/05/2023   ALKPHOS 114 08/05/2023   AST 17 08/05/2023   ALT 23 08/05/2023   PROT 7.1 08/05/2023   ALBUMIN 4.7 08/05/2023   CALCIUM 10.1 08/05/2023   GFRAA 122 10/28/2020    Speciality Comments:  No specialty comments available.  Procedures:  No procedures performed Allergies: Levofloxacin, Bactrim ds [sulfamethoxazole-trimethoprim], Lipitor [atorvastatin], Tape, Zetia [ezetimibe], Codeine, and Other   Assessment / Plan:     Visit Diagnoses: Seronegative rheumatoid arthritis (HCC)  High risk medication use  Pain in both hands  Primary osteoarthritis of both hands  Lateral epicondylitis of both elbows  Synovial cyst of popliteal space, unspecified laterality  Status post total knee replacement, right  Trochanteric bursitis of both hips  Pes cavus of both feet  Primary osteoarthritis of both feet  Degeneration of intervertebral disc of lumbar region without discogenic back pain or lower extremity pain  Other idiopathic scoliosis, lumbar region  Primary hypertension  Aortic atherosclerosis (HCC)  Prediabetes  Mixed hyperlipidemia  History of COPD  Tobacco abuse  Gastroesophageal reflux disease without esophagitis  Other migraine without status migrainosus, not intractable  Seasonal allergic rhinitis due to pollen  GAD (generalized anxiety disorder)  Overweight with body mass index (BMI) of 28 to 28.9 in adult  Family history of rheumatoid  arthritis-maternal uncle and maternal grandfather  Family history of psoriasis-son and granddaughter  Orders: No orders of the defined types were placed in this encounter.  No orders of the defined types were placed in this encounter.   Face-to-face time spent with patient was *** minutes. Greater than 50% of time was spent in counseling and coordination of care.  Follow-Up Instructions: No follow-ups on file.   Gearldine Bienenstock, PA-C  Note - This record has been created using Dragon software.  Chart creation errors have been sought, but may not always  have been located. Such creation errors do not reflect on  the standard of medical care.

## 2023-09-08 ENCOUNTER — Other Ambulatory Visit: Payer: Self-pay | Admitting: Family Medicine

## 2023-09-08 DIAGNOSIS — H9319 Tinnitus, unspecified ear: Secondary | ICD-10-CM | POA: Insufficient documentation

## 2023-09-08 DIAGNOSIS — J301 Allergic rhinitis due to pollen: Secondary | ICD-10-CM | POA: Insufficient documentation

## 2023-09-08 NOTE — Assessment & Plan Note (Signed)
Strongly recommend quit smoking. I do not believe her sore throat, allergy symptoms, or bronchitic symptoms will improve if she does not stop.

## 2023-09-08 NOTE — Assessment & Plan Note (Signed)
Persistent sore throat since COVID infection in August. Throat appears red on examination. Possible irritation from Advair and smoking. -Discontinue Advair to assess for improvement. -Gargle warm salt water and hot tea with honey for symptomatic relief. -Test for strep throat.

## 2023-09-08 NOTE — Assessment & Plan Note (Signed)
Possible irritation from Flonase use. -Discontinue Flonase and start Astelin nasal spray. -Continue regular use of neti pot. -Encourage increased water intake to thin mucus.

## 2023-09-08 NOTE — Assessment & Plan Note (Signed)
Recently restarted Advair, but patient feels it may be worsening symptoms. -Discontinue Advair and monitor for changes in symptoms. -Ensure patient is rinsing mouth after use to prevent oral thrush.

## 2023-09-08 NOTE — Assessment & Plan Note (Signed)
Recommend white noise.

## 2023-09-13 ENCOUNTER — Other Ambulatory Visit: Payer: Self-pay | Admitting: Family Medicine

## 2023-09-13 DIAGNOSIS — F411 Generalized anxiety disorder: Secondary | ICD-10-CM

## 2023-09-20 ENCOUNTER — Other Ambulatory Visit: Payer: Self-pay

## 2023-09-20 ENCOUNTER — Ambulatory Visit: Payer: Medicaid Other | Admitting: Physician Assistant

## 2023-09-20 DIAGNOSIS — F411 Generalized anxiety disorder: Secondary | ICD-10-CM

## 2023-09-20 DIAGNOSIS — K219 Gastro-esophageal reflux disease without esophagitis: Secondary | ICD-10-CM

## 2023-09-20 DIAGNOSIS — Z96651 Presence of right artificial knee joint: Secondary | ICD-10-CM

## 2023-09-20 DIAGNOSIS — Z79899 Other long term (current) drug therapy: Secondary | ICD-10-CM

## 2023-09-20 DIAGNOSIS — Z8709 Personal history of other diseases of the respiratory system: Secondary | ICD-10-CM

## 2023-09-20 DIAGNOSIS — G43809 Other migraine, not intractable, without status migrainosus: Secondary | ICD-10-CM

## 2023-09-20 DIAGNOSIS — E782 Mixed hyperlipidemia: Secondary | ICD-10-CM

## 2023-09-20 DIAGNOSIS — Z720 Tobacco use: Secondary | ICD-10-CM

## 2023-09-20 DIAGNOSIS — M4126 Other idiopathic scoliosis, lumbar region: Secondary | ICD-10-CM

## 2023-09-20 DIAGNOSIS — Q6672 Congenital pes cavus, left foot: Secondary | ICD-10-CM

## 2023-09-20 DIAGNOSIS — M7061 Trochanteric bursitis, right hip: Secondary | ICD-10-CM

## 2023-09-20 DIAGNOSIS — E663 Overweight: Secondary | ICD-10-CM

## 2023-09-20 DIAGNOSIS — I700 Atherosclerosis of aorta: Secondary | ICD-10-CM

## 2023-09-20 DIAGNOSIS — R7303 Prediabetes: Secondary | ICD-10-CM

## 2023-09-20 DIAGNOSIS — M51369 Other intervertebral disc degeneration, lumbar region without mention of lumbar back pain or lower extremity pain: Secondary | ICD-10-CM

## 2023-09-20 DIAGNOSIS — M7711 Lateral epicondylitis, right elbow: Secondary | ICD-10-CM

## 2023-09-20 DIAGNOSIS — J301 Allergic rhinitis due to pollen: Secondary | ICD-10-CM

## 2023-09-20 DIAGNOSIS — I10 Essential (primary) hypertension: Secondary | ICD-10-CM

## 2023-09-20 DIAGNOSIS — M7120 Synovial cyst of popliteal space [Baker], unspecified knee: Secondary | ICD-10-CM

## 2023-09-20 DIAGNOSIS — Z840 Family history of diseases of the skin and subcutaneous tissue: Secondary | ICD-10-CM

## 2023-09-20 DIAGNOSIS — Z8261 Family history of arthritis: Secondary | ICD-10-CM

## 2023-09-20 DIAGNOSIS — M0600 Rheumatoid arthritis without rheumatoid factor, unspecified site: Secondary | ICD-10-CM

## 2023-09-20 DIAGNOSIS — M19072 Primary osteoarthritis, left ankle and foot: Secondary | ICD-10-CM

## 2023-09-20 DIAGNOSIS — M79641 Pain in right hand: Secondary | ICD-10-CM

## 2023-09-20 DIAGNOSIS — M19042 Primary osteoarthritis, left hand: Secondary | ICD-10-CM

## 2023-09-20 DIAGNOSIS — F17219 Nicotine dependence, cigarettes, with unspecified nicotine-induced disorders: Secondary | ICD-10-CM

## 2023-09-21 HISTORY — PX: MINOR CARPAL TUNNEL: SHX6472

## 2023-09-21 HISTORY — PX: ULNAR TUNNEL RELEASE: SHX820

## 2023-09-22 ENCOUNTER — Ambulatory Visit: Payer: Medicaid Other | Admitting: Family Medicine

## 2023-09-22 ENCOUNTER — Encounter: Payer: Self-pay | Admitting: Family Medicine

## 2023-09-22 VITALS — BP 110/70 | HR 91 | Temp 97.3°F | Resp 18 | Ht 65.0 in | Wt 171.6 lb

## 2023-09-22 DIAGNOSIS — J441 Chronic obstructive pulmonary disease with (acute) exacerbation: Secondary | ICD-10-CM

## 2023-09-22 DIAGNOSIS — F17219 Nicotine dependence, cigarettes, with unspecified nicotine-induced disorders: Secondary | ICD-10-CM | POA: Diagnosis not present

## 2023-09-22 MED ORDER — AMOXICILLIN-POT CLAVULANATE 875-125 MG PO TABS: 1.0000 | ORAL_TABLET | Freq: Two times a day (BID) | ORAL | 0 refills | Status: DC

## 2023-09-22 MED ORDER — NICOTINE 21 MG/24HR TD PT24: 21.0000 mg | MEDICATED_PATCH | Freq: Every day | TRANSDERMAL | 0 refills | Status: DC

## 2023-09-22 NOTE — Progress Notes (Signed)
 Office Visit Note  Patient: [name]             Date of Birth: 1966-05-01           MRN: [id number]             PCP: Sherre Clapper, MD Referring: Sherre Clapper, MD Visit Date: 09/23/2023 Occupation: @GUAROCC @  Subjective:  Pain in multiple joints  History of Present Illness: [name] is a 58 y.o. female with history of seronegative rheumatoid arthritis and osteoarthritis.  Patient is currently taking  Plaquenil  200 mg 1 tablet twice daily M-F.  She has been tolerating plaquenil  without any side effects and has not had any gaps in therapy.  She has been taking Plaquenil  for about 6 weeks but has not yet noticed any clinical improvement.  She continues to have persistent pain in both hands especially on the dorsal aspect of both hands recently.  She also has ongoing discomfort in her wrist joints.  She will be following up with Dr. Ortman this morning for a left wrist injection.  She is also scheduled for a right elbow lateral epicondyle injection on Monday at [facility] in [place].  She has been taking tylenol  daily for pain relief and hydrocodone  for moderate to severe pain relief.    Activities of Daily Living:  Patient reports morning stiffness for 24 hours.   Patient Reports nocturnal pain.  Difficulty dressing/grooming: Denies Difficulty climbing stairs: Reports Difficulty getting out of chair: Denies Difficulty using hands for taps, buttons, cutlery, and/or writing: Reports  Review of Systems  Constitutional:  Positive for fatigue.  HENT:  Positive for mouth sores and mouth dryness.   Eyes:  Positive for dryness.  Respiratory:  Negative for shortness of breath.   Cardiovascular:  Negative for chest pain and palpitations.  Gastrointestinal:  Negative for blood in stool, constipation and diarrhea.  Endocrine: Negative for increased urination.  Genitourinary:  Negative for involuntary urination.  Musculoskeletal:  Positive for joint pain, gait problem, joint pain,  joint swelling, myalgias, muscle weakness, morning stiffness, muscle tenderness and myalgias.  Skin:  Positive for color change. Negative for rash, hair loss and sensitivity to sunlight.  Allergic/Immunologic: Positive for susceptible to infections.  Neurological:  Positive for headaches. Negative for dizziness.  Hematological:  Negative for swollen glands.  Psychiatric/Behavioral:  Positive for depressed mood and sleep disturbance. The patient is nervous/anxious.     PMFS History:  Patient Active Problem List   Diagnosis Date Noted   Tinnitus 09/08/2023   Non-seasonal allergic rhinitis due to pollen 09/08/2023   Seasonal allergic rhinitis due to pollen 06/16/2023   Greater trochanteric bursitis of left hip 04/12/2023   Degenerative scoliosis in adult patient 02/28/2023   Aspirin -Rich platelet function defect (HCC) 01/13/2023   Dental caries 01/04/2023   History of blood transfusion 01/04/2023   Failure of dental implant due to infection 01/04/2023   Acute recurrent maxillary sinusitis 12/09/2022   Lumbar back pain 09/14/2022   Tobacco abuse 06/09/2022   Aortic atherosclerosis (HCC) 05/08/2022   Chronic midline low back pain without sciatica 01/22/2022   Myalgia due to statin 01/22/2022   GERD (gastroesophageal reflux disease) 01/22/2022   Overweight with body mass index (BMI) of 28 to 28.9 in adult 12/31/2021   COPD exacerbation (HCC) 11/22/2021   Acute recurrent frontal sinusitis 10/20/2021   Prediabetes 10/20/2021   Migraine 10/20/2021   Primary hypertension 10/20/2021   COVID-19 virus infection 10/20/2021   Sore throat 10/01/2020   DDD (degenerative  disc disease), lumbar 06/17/2020   Neuropathy 04/08/2020   Simple chronic bronchitis (HCC) 01/06/2020   Other fatigue 01/01/2020   Spontaneous bruising 01/01/2020   Cigarette nicotine  dependence with nicotine -induced disorder 01/01/2020   Mixed hyperlipidemia 01/01/2020   GAD (generalized anxiety disorder) 01/01/2020    Smoking 06/05/2019   Prinzmetal angina (HCC) 06/05/2019   Primary osteoarthritis of one knee, right 02/09/2019   Pain of left hip joint 12/19/2018   Osteoarthritis of carpometacarpal [facility]) joint of thumb 11/20/2018    Past Medical History:  Diagnosis Date   Allergic rhinitis    Anginal pain (HCC)    Arthritis    Blood transfusion without reported diagnosis    Cellulitis, face 06/08/2022   Complication of anesthesia    COPD (chronic obstructive pulmonary disease) (HCC)    Dental abscess 06/09/2022   GERD (gastroesophageal reflux disease)    History of colon polyps    History of kidney stones    HTN (hypertension)    Hyperlipidemia    Kidney stones    Pancreatitis    Pneumonia    PONV (postoperative nausea and vomiting)    Primary hypertension 10/20/2021   Psychotic depression (HCC)    Shingles February 26th, 2001   Tendinitis    both arms    Family History  Problem Relation Age of Onset   Arthritis Mother    Lung disease Mother    Parkinson's disease Mother    COPD Father    Arthritis Father    Healthy Sister    Breast cancer Maternal Aunt        great aunt   Rheum arthritis Maternal Uncle    Rheum arthritis Maternal Grandfather    Healthy Son    High Cholesterol Son    Healthy Son    Colon cancer Cousin        mother's cousin   Diabetes Other    Stroke Other    Hypertension Other    Hyperlipidemia Other    Asthma Other    Heart failure Other    Thyroid  disease Other    Heart attack Other    COPD Other    Arrhythmia Other    Arthritis Other    Migraines Other    Past Surgical History:  Procedure Laterality Date   ABDOMINAL HYSTERECTOMY     BACK SURGERY  02/2023   CARDIAC CATHETERIZATION  2008   CESAREAN SECTION     CHOLECYSTECTOMY     COLONOSCOPY  05/02/2017   Colonic polyp status post polypectomy. Small internal hemorrhoids   FRACTURE SURGERY Bilateral    femur   REPLACEMENT TOTAL KNEE Right 02/09/2019   TUBAL LIGATION     Social History    Social History Narrative   Not on file   Immunization History  Administered Date(s) Administered   Influenza,inj,Quad PF,6+ Mos 06/27/2018   Influenza-Unspecified 07/08/2017, 08/09/2022   PNEUMOCOCCAL CONJUGATE-20 05/06/2022   Tdap 12/01/2021   Zoster Recombinant(Shingrix) 09/14/2017, 05/05/2018     Objective: Vital Signs: BP 112/72 (BP Location: Left Arm, Patient Position: Sitting, Cuff Size: Normal)   Pulse 90   Resp 14   Ht 5' 6 (1.676 m)   Wt 174 lb (78.9 kg)   LMP  (LMP Unknown) Comment: hysterectomy 2008  BMI 28.08 kg/m    Physical Exam Vitals and nursing note reviewed.  Constitutional:      Appearance: She is well-developed.  HENT:     Head: Normocephalic and atraumatic.  Eyes:     Conjunctiva/sclera: Conjunctivae normal.  Cardiovascular:     Rate and Rhythm: Normal rate and regular rhythm.     Heart sounds: Normal heart sounds.  Pulmonary:     Effort: Pulmonary effort is normal.     Breath sounds: Normal breath sounds.  Abdominal:     General: Bowel sounds are normal.     Palpations: Abdomen is soft.  Musculoskeletal:     Cervical back: Normal range of motion.  Lymphadenopathy:     Cervical: No cervical adenopathy.  Skin:    General: Skin is warm and dry.     Capillary Refill: Capillary refill takes less than 2 seconds.  Neurological:     Mental Status: She is alert and oriented to person, place, and time.  Psychiatric:        Behavior: Behavior normal.      Musculoskeletal Exam: C-spine has good range of motion.  Limited mobility of the lumbar spine.  Shoulder joints have good range of motion with some discomfort in the right shoulder.  Tenderness over the lateral epicondyle of both elbows.  Tenderness over both wrist and the dorsal aspect of both hands.  Mild inflammation noted in the left wrist.  PIP and DIP thickening noted.  Tenderness of the right second and third MCP joints.  Hip joints have good range of motion with no groin pain.  Rght knee  replacement has good range of motion.  Baker's cyst palpable behind both knees. Ankle joints have good ROM with no tenderness or joint swelling.     CDAI Exam: CDAI Score: -- Patient Global: --; Provider Global: -- Swollen: --; Tender: -- Joint Exam 09/23/2023   No joint exam has been documented for this visit   There is currently no information documented on the homunculus. Go to the Rheumatology activity and complete the homunculus joint exam.  Investigation: No additional findings.  Imaging: No results found.  Recent Labs: Lab Results  Component Value Date   WBC 12.1 (H) 08/05/2023   HGB 14.2 08/05/2023   PLT 427 08/05/2023   NA 140 08/05/2023   K 4.6 08/05/2023   CL 100 08/05/2023   CO2 23 08/05/2023   GLUCOSE 92 08/05/2023   BUN 14 08/05/2023   CREATININE 0.58 08/05/2023   BILITOT <0.2 08/05/2023   ALKPHOS 114 08/05/2023   AST 17 08/05/2023   ALT 23 08/05/2023   PROT 7.1 08/05/2023   ALBUMIN 4.7 08/05/2023   CALCIUM  10.1 08/05/2023   GFRAA 122 10/28/2020    Speciality Comments: No specialty comments available.  Procedures:  No procedures performed Allergies: Levofloxacin, Bactrim  ds [sulfamethoxazole -trimethoprim ], Lipitor [atorvastatin ], Tape, Zetia [ezetimibe], Codeine, and Other   Assessment / Plan:     Visit Diagnoses: Seronegative rheumatoid arthritis (HCC): Anti-CCP negative, RF negative, ESR within normal limits on 07/12/2023, ultrasound of both hands positive for synovitis on 08/04/2023: Patient was initiated on Plaquenil  200 mg 1 tablet by mouth twice daily Monday through Friday after her last office visit on 08/04/2023.  She has been tolerating Plaquenil  without any side effects and has not had any gaps in therapy.  She has not yet noticed any clinical improvement since initiating Plaquenil .  She continues to have chronic pain involving multiple joints.  She has had persistent pain in both hands and is scheduled to have a left wrist injection performed  by Dr. Shari today.  She is willing to give Plaquenil  more time and for us  to reassess for the full efficacy in 2 to 3 months.  She plans to continue to  take Tylenol  as needed for pain relief.  Plan to check CBC and CMP today.  She will follow-up in the office in 2 to 3 months or sooner if needed.  High risk medication use - Plaquenil  200 mg 1 tablet by mouth twice daily Monday through Friday.  No baseline plaquenil  eye examination on file--patient has had part of the Plaquenil  examination performed and will be returning for the visual field.  She will have the Plaquenil  eye examination forwarded to our office once completed by ophthalmology. CBC and CMP updated on 08/05/23. Orders for CBC and CMP released today.  - Plan: COMPLETE METABOLIC PANEL WITH GFR, CBC with Differential/Platelet  Pain in both hands: X-rays of both hands were consistent with osteoarthritis and possible inflammatory arthritis on 07/12/2023.  She had ultrasound of both hands on 08/04/2023 which revealed synovitis over bilateral second and third MCP joints. She has not yet noticed any clinical improvement since starting plaquenil .  She has difficulty performing repetitive or overuse activities.   Primary osteoarthritis of both hands: PIP and DIP thickening.  X-rays of both hands were consistent with osteoarthritis and inflammatory arthritis overlap.  She continues to have persistent pain in both hands and has been taking tylenol  as needed for pain relief.    Lateral epicondylitis of both elbows: Remains symptomatic.  She has tenderness over the lateral epicondyles of both elbows.  She is scheduled for a right elbow injection on Monday.   Synovial cyst of popliteal space, unspecified laterality: Baker's cyst palpable on the posterior aspect of both knees.   Status post total knee replacement, right - 2020 right total knee replacement by Dr. Georgina.  Doing well.  No warmth or effusion noted.  Pes cavus of both feet  Primary  osteoarthritis of both feet: X-rays of both feet were obtained on 12/14/2022.  Degeneration of intervertebral disc of lumbar region without discogenic back pain or lower extremity pain - S/p laminectomy September 20, 2022.PLIF L3-L4, L4-L5 by Dr. Mavis February 28, 2023.  Chronic pain.  Limited mobility.  Difficulty standing or sitting for prolonged periods of time.  Patient has filed for disability.  She has been taking Tylenol  daily basis for pain relief and if pain is severe she will take hydrocodone  and gabapentin .  Trochanteric bursitis of both hips: Patient has persistent discomfort on the lateral aspect of both hips consistent with trigger bursitis.  Other idiopathic scoliosis, lumbar region: Chronic pain and limited mobility.  She has difficulty standing or sitting for prolonged periods of time due to the discomfort in her lower back.  Other medical conditions are listed as follows:  Primary hypertension: Blood pressure was 112/72 today in the office.  History of COPD  Aortic atherosclerosis (HCC)  Prediabetes  Mixed hyperlipidemia  Tobacco abuse  GAD (generalized anxiety disorder)  Gastroesophageal reflux disease without esophagitis  Other migraine without status migrainosus, not intractable  Seasonal allergic rhinitis due to pollen  Family history of rheumatoid arthritis-maternal uncle and maternal grandfather  Family history of psoriasis-son and granddaughter  Orders: Orders Placed This Encounter  Procedures   COMPLETE METABOLIC PANEL WITH GFR   CBC with Differential/Platelet   No orders of the defined types were placed in this encounter.    Follow-Up Instructions: Return in about 2 months (around 11/21/2023) for Rheumatoid arthritis, Osteoarthritis.   Waddell CHRISTELLA Craze, PA-C  Note - This record has been created using Dragon software.  Chart creation errors have been sought, but may not always  have been located. Such creation  errors do not reflect on  the standard  of medical care.

## 2023-09-22 NOTE — Assessment & Plan Note (Signed)
 Acute Symptoms for two weeks with colored sputum. No fever. - Prescribe antibiotic Wheezing in left lower lobe. Patient has been using albuterol  as needed and Advair once daily instead of twice daily as prescribed. - Increase Advair to prescribed dose of two puffs twice daily. - Continue albuterol  as needed. - Consider switching to Airsupra  if insurance allows.

## 2023-09-22 NOTE — Assessment & Plan Note (Signed)
 Patient has reduced smoking to one pack per day and is using 21mg  nicotine patches. - Continue current nicotine patch regimen. - Encourage further reduction in smoking because the following recurring symptoms of sore throat, allergies, and respiratory infections will continue if she doesn't quit.

## 2023-09-22 NOTE — Progress Notes (Addendum)
 Acute Office Visit  Subjective:    Patient ID: [name], female    DOB: Sep 05, 1966, 58 y.o.   MRN: [id number]  Chief Complaint  Patient presents with   Sinusitis    Discussed the use of AI scribe software for clinical note transcription with the patient, who gave verbal consent to proceed.   HPI: Upper respiratory symptoms She complains of nasal congestion and non productive cough.with no fever, chills, night sweats or weight loss. Onset of symptoms was about a week ago and staying constant.She is drinking plenty of fluids. Patient is a one pack a day smoker.  The patient, with a history of COPD, presents with respiratory symptoms that started approximately two weeks ago. They describe a productive cough with sputum that is between green and brown in color. They also report wheezing and a sinus headache. They have been using their inhalers, including albuterol  as needed and Advair once a day, although it was prescribed for twice-daily use. The patient also mentions having a runny nose and a slightly scratchy throat.  In addition to the respiratory symptoms, the patient reports widespread joint pain, including severe tendinitis in both arms. They also mention a recent cessation of abdominal pain, which they attribute to constipation caused by their medication, Ozempic . The patient is also on nicotine  patches for smoking cessation and reports a reduction in smoking from two packs to one pack a day.  Past Medical History:  Diagnosis Date   Allergic rhinitis    Anginal pain (HCC)    Arthritis    Blood transfusion without reported diagnosis    Cellulitis, face 06/08/2022   Complication of anesthesia    COPD (chronic obstructive pulmonary disease) (HCC)    Dental abscess 06/09/2022   GERD (gastroesophageal reflux disease)    History of colon polyps    History of kidney stones    HTN (hypertension)    Hyperlipidemia    Kidney stones    Pancreatitis    Pneumonia    PONV  (postoperative nausea and vomiting)    Primary hypertension 10/20/2021   Psychotic depression (HCC)    Shingles February 26th, 2001    Past Surgical History:  Procedure Laterality Date   ABDOMINAL HYSTERECTOMY     BACK SURGERY  02/2023   CARDIAC CATHETERIZATION  2008   CESAREAN SECTION     CHOLECYSTECTOMY     COLONOSCOPY  05/02/2017   Colonic polyp status post polypectomy. Small internal hemorrhoids   FRACTURE SURGERY Bilateral    femur   REPLACEMENT TOTAL KNEE Right 02/09/2019   TUBAL LIGATION      Family History  Problem Relation Age of Onset   Arthritis Mother    Lung disease Mother    Parkinson's disease Mother    COPD Father    Arthritis Father    Healthy Sister    Breast cancer Maternal Aunt        great aunt   Rheum arthritis Maternal Uncle    Rheum arthritis Maternal Grandfather    Healthy Son    High Cholesterol Son    Healthy Son    Colon cancer Cousin        mother's cousin   Diabetes Other    Stroke Other    Hypertension Other    Hyperlipidemia Other    Asthma Other    Heart failure Other    Thyroid  disease Other    Heart attack Other    COPD Other    Arrhythmia Other  Arthritis Other    Migraines Other     Social History   Socioeconomic History   Marital status: Widowed    Spouse name: Not on file   Number of children: 2   Years of education: Not on file   Highest education level: GED or equivalent  Occupational History   Not on file  Tobacco Use   Smoking status: Every Day    Current packs/day: 1.00    Average packs/day: 1 pack/day for 20.0 years (20.0 ttl pk-yrs)    Types: Cigarettes    Passive exposure: Past   Smokeless tobacco: Never  Vaping Use   Vaping status: Never Used  Substance and Sexual Activity   Alcohol use: Yes    Comment: occas   Drug use: No   Sexual activity: Not Currently  Other Topics Concern   Not on file  Social History Narrative   Not on file   Social Drivers of Health   Financial Resource Strain:  Medium Risk (08/05/2023)   Overall Financial Resource Strain (CARDIA)    Difficulty of Paying Living Expenses: Somewhat hard  Food Insecurity: Food Insecurity Present (08/05/2023)   Hunger Vital Sign    Worried About Running Out of Food in the Last Year: Often true    Ran Out of Food in the Last Year: Sometimes true  Transportation Needs: No Transportation Needs (08/05/2023)   PRAPARE - Administrator, Civil Service (Medical): No    Lack of Transportation (Non-Medical): No  Physical Activity: Inactive (08/05/2023)   Exercise Vital Sign    Days of Exercise per Week: 0 days    Minutes of Exercise per Session: 0 min  Stress: No Stress Concern Present (08/05/2023)   [employer] of Occupational Health - Occupational Stress Questionnaire    Feeling of Stress : Only a little  Social Connections: Moderately Integrated (08/05/2023)   Social Connection and Isolation Panel [NHANES]    Frequency of Communication with Friends and Family: More than three times a week    Frequency of Social Gatherings with Friends and Family: Twice a week    Attends Religious Services: More than 4 times per year    Active Member of [employer] or Organizations: Yes    Attends Banker Meetings: 1 to 4 times per year    Marital Status: Widowed  Intimate Partner Violence: Not At Risk (04/12/2023)   Humiliation, Afraid, Rape, and Kick questionnaire    Fear of Current or Ex-Partner: No    Emotionally Abused: No    Physically Abused: No    Sexually Abused: No    Outpatient Medications Prior to Visit  Medication Sig Dispense Refill   acetaminophen  (TYLENOL ) 650 MG CR tablet Take 650-1,300 mg by mouth every 8 (eight) hours as needed for pain.     albuterol  (VENTOLIN  HFA) 108 (90 Base) MCG/ACT inhaler INHALE 2 PUFFS INTO LUNGS EVERY 6 TO 8 HOURS AS NEEDED FOR WHEEZING 18 g 1   Albuterol -Budesonide  (AIRSUPRA ) 90-80 MCG/ACT AERO Inhale 1 puff into the lungs every 6 (six) hours as needed.      ALPRAZolam  (XANAX ) 0.25 MG tablet TAKE 1 TABLET(0.25 MG) BY MOUTH DAILY AS NEEDED FOR ANXIETY 30 tablet 1   amLODipine  (NORVASC ) 10 MG tablet TAKE 1 TABLET BY MOUTH DAILY 90 tablet 0   aspirin  EC 81 MG tablet Take 1 tablet (81 mg total) by mouth daily. Swallow whole.     azelastine  (ASTELIN ) 0.1 % nasal spray Place 2 sprays into both nostrils  2 (two) times daily. Use in each nostril as directed 30 mL 12   buPROPion  (WELLBUTRIN  XL) 300 MG 24 hr tablet TAKE 1 TABLET(300 MG) BY MOUTH DAILY. 90 tablet 1   cyclobenzaprine  (FLEXERIL ) 10 MG tablet TAKE 1 TABLET(10 MG) BY MOUTH THREE TIMES DAILY AS NEEDED FOR MUSCLE SPASMS 270 tablet 0   dicyclomine  (BENTYL ) 10 MG capsule TAKE 1 CAPSULE(10 MG) BY MOUTH FOUR TIMES DAILY BEFORE MEALS AND AT BEDTIME 120 capsule 2   Docusate Sodium  (DSS) 100 MG CAPS Take 100 mg by mouth daily as needed (constipation).     famotidine  (PEPCID ) 40 MG tablet Take 1 tablet (40 mg total) by mouth daily as needed for heartburn or indigestion. 90 tablet 1   fluticasone  (FLONASE ) 50 MCG/ACT nasal spray SHAKE LIQUID AND USE 2 SPRAYS IN EACH NOSTRIL DAILY 16 g 6   fluticasone -salmeterol (ADVAIR HFA) 115-21 MCG/ACT inhaler Inhale 2 puffs into the lungs 2 (two) times daily. (Patient taking differently: Inhale 2 puffs into the lungs 2 (two) times daily as needed (asthma).) 3 each 3   furosemide  (LASIX ) 20 MG tablet Take 1 tablet (20 mg total) by mouth daily as needed for edema. 1 tablet 0   gabapentin  (NEURONTIN ) 300 MG capsule Take 300 mg by mouth 3 (three) times daily.     HYDROcodone -acetaminophen  (NORCO/VICODIN) 5-325 MG tablet Take 1-2 tablets by mouth every 6 (six) hours as needed.     hydroxychloroquine  (PLAQUENIL ) 200 MG tablet Take one tablet by mouth twice daily MONDAYS THROUGH FRIDAYS ONLY. Do not take on Saturday and Sunday 120 tablet 0   levocetirizine (XYZAL ) 5 MG tablet TAKE 1 TABLET(5 MG) BY MOUTH EVERY EVENING 90 tablet 0   meloxicam (MOBIC) 15 MG tablet Take 15 mg by mouth  daily.     montelukast  (SINGULAIR ) 10 MG tablet TAKE 1 TABLET BY MOUTH AT BEDTIME 90 tablet 0   nicotine  (NICODERM CQ  - DOSED IN MG/24 HOURS) 21 mg/24hr patch APPLY 1 PATCH(21 MG) TOPICALLY TO THE SKIN DAILY 28 patch 0   nitroGLYCERIN  (NITROSTAT ) 0.4 MG SL tablet Place 1 tablet (0.4 mg total) under the tongue every 5 (five) minutes as needed for chest pain. 50 tablet 3   omeprazole  (PRILOSEC) 40 MG capsule TAKE 1 CAPSULE(40 MG) BY MOUTH DAILY 90 capsule 3   OZEMPIC , 2 MG/DOSE, 8 MG/3ML SOPN INJECT 2 MG AS DIRECTED ONCE A WEEK 9 mL 0   REPATHA  SURECLICK 140 MG/ML SOAJ ADMINISTER 1 ML(140MG ) UNDER THE SKIN EVERY 14 DAYS AS DIRECTED 2 mL 2   triamcinolone  (KENALOG ) 0.025 % ointment Apply 1 Application topically 2 (two) times daily. Apply to corners of mouth twice daily 30 g 0   nitroGLYCERIN  (NITROSTAT ) 0.4 MG SL tablet DISSOLVE 1 TABLET BY MOUTH UNDER THE TONGUE AS NEEDED FOR CHEST PAIN EVERY 5 MINUTES UP TO 3 TIMES 25 tablet 0   Facility-Administered Medications Prior to Visit  Medication Dose Route Frequency Provider Last Rate Last Admin   ipratropium-albuterol  (DUONEB) 0.5-2.5 (3) MG/3ML nebulizer solution 3 mL  3 mL Nebulization Once         Allergies  Allergen Reactions   Levofloxacin Other (See Comments) and Rash    Rash all over redness   Bactrim  Ds [Sulfamethoxazole -Trimethoprim ] Nausea Only   Lipitor [Atorvastatin ] Other (See Comments)    Myalgia    Tape Hives   Zetia [Ezetimibe]     Muscle pain   Codeine Nausea Only   Other Rash    bandaids leave a red area  if left on too long    Review of Systems  Constitutional:  Positive for chills. Negative for appetite change and fatigue.  HENT:  Positive for congestion (green brownish). Negative for ear pain, rhinorrhea and sore throat.   Respiratory:  Positive for cough and wheezing. Negative for shortness of breath.   Cardiovascular:  Negative for chest pain.  Gastrointestinal:  Negative for abdominal pain, constipation, diarrhea,  nausea and vomiting.  Genitourinary:  Negative for dysuria, frequency and urgency.  Musculoskeletal:  Positive for arthralgias (hands). Negative for back pain and myalgias.  Neurological:  Negative for dizziness and headaches.  Psychiatric/Behavioral:  Negative for agitation and sleep disturbance. The patient is not nervous/anxious.        Objective:        09/22/2023    8:42 AM 09/05/2023    3:33 PM 08/05/2023    7:46 AM  Vitals with BMI  Height 5' 5 5' 5 5' 5  Weight 171 lbs 10 oz 174 lbs 165 lbs  BMI 28.56 28.96 27.46  Systolic 110 128 877  Diastolic 70 74 72  Pulse 91 82 88    Orthostatic VS for the past 72 hrs (Last 3 readings):  Patient Position BP Location Cuff Size  09/22/23 0842 Sitting Left Arm Large     Physical Exam Vitals reviewed.  Constitutional:      General: She is not in acute distress. HENT:     Nose: Congestion present.  Neck:     Vascular: No carotid bruit.  Cardiovascular:     Rate and Rhythm: Normal rate.  Pulmonary:     Effort: Pulmonary effort is normal.     Breath sounds: Examination of the right-lower field reveals rhonchi. Examination of the left-lower field reveals wheezing and rhonchi. Wheezing and rhonchi present.  Musculoskeletal:     Cervical back: Normal range of motion.  Skin:    General: Skin is warm.  Neurological:     Mental Status: She is alert. Mental status is at baseline.  Psychiatric:        Mood and Affect: Mood normal.     Health Maintenance Due  Topic Date Due   Cervical Cancer Screening (HPV/Pap Cotest)  04/06/2019   Lung Cancer Screening  12/31/2022    There are no preventive care reminders to display for this patient.   Lab Results  Component Value Date   TSH 0.955 08/05/2023   Lab Results  Component Value Date   WBC 12.1 (H) 08/05/2023   HGB 14.2 08/05/2023   HCT 43.4 08/05/2023   MCV 94 08/05/2023   PLT 427 08/05/2023   Lab Results  Component Value Date   NA 140 08/05/2023   K 4.6  08/05/2023   CO2 23 08/05/2023   GLUCOSE 92 08/05/2023   BUN 14 08/05/2023   CREATININE 0.58 08/05/2023   BILITOT <0.2 08/05/2023   ALKPHOS 114 08/05/2023   AST 17 08/05/2023   ALT 23 08/05/2023   PROT 7.1 08/05/2023   ALBUMIN 4.7 08/05/2023   CALCIUM  10.1 08/05/2023   ANIONGAP 9 02/21/2023   EGFR 105 08/05/2023   Lab Results  Component Value Date   CHOL 187 08/05/2023   Lab Results  Component Value Date   HDL 79 08/05/2023   Lab Results  Component Value Date   LDLCALC 83 08/05/2023   Lab Results  Component Value Date   TRIG 151 (H) 08/05/2023   Lab Results  Component Value Date   CHOLHDL 2.4 08/05/2023   Lab  Results  Component Value Date   HGBA1C 5.4 08/05/2023       Assessment & Plan:  COPD exacerbation (HCC) Assessment & Plan: Acute Symptoms for two weeks with colored sputum. No fever. - Prescribe antibiotic Wheezing in left lower lobe. Patient has been using albuterol  as needed and Advair once daily instead of twice daily as prescribed. - Increase Advair to prescribed dose of two puffs twice daily. - Continue albuterol  as needed. - Consider switching to Airsupra  if insurance allows.   Orders: -     Amoxicillin -Pot Clavulanate; Take 1 tablet by mouth 2 (two) times daily.  Dispense: 20 tablet; Refill: 0  Cigarette nicotine  dependence with nicotine -induced disorder Assessment & Plan: Patient has reduced smoking to one pack per day and is using 21mg  nicotine  patches. - Continue current nicotine  patch regimen. - Encourage further reduction in smoking because the following recurring symptoms of sore throat, allergies, and respiratory infections will continue if she doesn't quit.  Orders: -     Nicotine ; Place 1 patch (21 mg total) onto the skin daily.  Dispense: 28 patch; Refill: 0     Meds ordered this encounter  Medications   amoxicillin -clavulanate (AUGMENTIN ) 875-125 MG tablet    Sig: Take 1 tablet by mouth 2 (two) times daily.    Dispense:  20  tablet    Refill:  0   nicotine  (NICODERM CQ  - DOSED IN MG/24 HOURS) 21 mg/24hr patch    Sig: Place 1 patch (21 mg total) onto the skin daily.    Dispense:  28 patch    Refill:  0    No orders of the defined types were placed in this encounter.    Follow-up: Return in about 6 weeks (around 11/04/2023) for chronic.  An After Visit Summary was printed and given to the patient.  Total time spent on today's visit was 34 minutes, including both face-to-face time and nonface-to-face time personally spent on review of chart (labs and imaging), discussing labs and goals, discussing further work-up, treatment options, referrals to specialist if needed, reviewing outside records if pertinent, answering patient's questions, and coordinating care.    Harrie Cedar, FNP [facility] [phone]

## 2023-09-23 ENCOUNTER — Ambulatory Visit: Payer: Medicaid Other | Attending: Physician Assistant | Admitting: Physician Assistant

## 2023-09-23 ENCOUNTER — Encounter: Payer: Self-pay | Admitting: Physician Assistant

## 2023-09-23 VITALS — BP 112/72 | HR 90 | Resp 14 | Ht 66.0 in | Wt 174.0 lb

## 2023-09-23 DIAGNOSIS — M19042 Primary osteoarthritis, left hand: Secondary | ICD-10-CM

## 2023-09-23 DIAGNOSIS — Q6671 Congenital pes cavus, right foot: Secondary | ICD-10-CM

## 2023-09-23 DIAGNOSIS — M51369 Other intervertebral disc degeneration, lumbar region without mention of lumbar back pain or lower extremity pain: Secondary | ICD-10-CM

## 2023-09-23 DIAGNOSIS — M19071 Primary osteoarthritis, right ankle and foot: Secondary | ICD-10-CM | POA: Diagnosis not present

## 2023-09-23 DIAGNOSIS — M4126 Other idiopathic scoliosis, lumbar region: Secondary | ICD-10-CM

## 2023-09-23 DIAGNOSIS — F411 Generalized anxiety disorder: Secondary | ICD-10-CM

## 2023-09-23 DIAGNOSIS — E782 Mixed hyperlipidemia: Secondary | ICD-10-CM

## 2023-09-23 DIAGNOSIS — Z79899 Other long term (current) drug therapy: Secondary | ICD-10-CM | POA: Diagnosis not present

## 2023-09-23 DIAGNOSIS — M7712 Lateral epicondylitis, left elbow: Secondary | ICD-10-CM

## 2023-09-23 DIAGNOSIS — Z840 Family history of diseases of the skin and subcutaneous tissue: Secondary | ICD-10-CM

## 2023-09-23 DIAGNOSIS — M79642 Pain in left hand: Secondary | ICD-10-CM

## 2023-09-23 DIAGNOSIS — M7711 Lateral epicondylitis, right elbow: Secondary | ICD-10-CM | POA: Diagnosis not present

## 2023-09-23 DIAGNOSIS — Z8709 Personal history of other diseases of the respiratory system: Secondary | ICD-10-CM

## 2023-09-23 DIAGNOSIS — M7120 Synovial cyst of popliteal space [Baker], unspecified knee: Secondary | ICD-10-CM

## 2023-09-23 DIAGNOSIS — Z8261 Family history of arthritis: Secondary | ICD-10-CM

## 2023-09-23 DIAGNOSIS — R7303 Prediabetes: Secondary | ICD-10-CM

## 2023-09-23 DIAGNOSIS — M25532 Pain in left wrist: Secondary | ICD-10-CM | POA: Diagnosis not present

## 2023-09-23 DIAGNOSIS — Z96651 Presence of right artificial knee joint: Secondary | ICD-10-CM

## 2023-09-23 DIAGNOSIS — M79641 Pain in right hand: Secondary | ICD-10-CM

## 2023-09-23 DIAGNOSIS — G43809 Other migraine, not intractable, without status migrainosus: Secondary | ICD-10-CM

## 2023-09-23 DIAGNOSIS — M19072 Primary osteoarthritis, left ankle and foot: Secondary | ICD-10-CM

## 2023-09-23 DIAGNOSIS — M19041 Primary osteoarthritis, right hand: Secondary | ICD-10-CM

## 2023-09-23 DIAGNOSIS — M0600 Rheumatoid arthritis without rheumatoid factor, unspecified site: Secondary | ICD-10-CM | POA: Diagnosis not present

## 2023-09-23 DIAGNOSIS — K219 Gastro-esophageal reflux disease without esophagitis: Secondary | ICD-10-CM

## 2023-09-23 DIAGNOSIS — Q6672 Congenital pes cavus, left foot: Secondary | ICD-10-CM

## 2023-09-23 DIAGNOSIS — Z720 Tobacco use: Secondary | ICD-10-CM

## 2023-09-23 DIAGNOSIS — M7061 Trochanteric bursitis, right hip: Secondary | ICD-10-CM

## 2023-09-23 DIAGNOSIS — M7062 Trochanteric bursitis, left hip: Secondary | ICD-10-CM

## 2023-09-23 DIAGNOSIS — I10 Essential (primary) hypertension: Secondary | ICD-10-CM

## 2023-09-23 DIAGNOSIS — M19021 Primary osteoarthritis, right elbow: Secondary | ICD-10-CM | POA: Diagnosis not present

## 2023-09-23 DIAGNOSIS — J301 Allergic rhinitis due to pollen: Secondary | ICD-10-CM

## 2023-09-23 DIAGNOSIS — I700 Atherosclerosis of aorta: Secondary | ICD-10-CM

## 2023-09-24 LAB — COMPLETE METABOLIC PANEL WITH GFR
AG Ratio: 2.4 (calc) (ref 1.0–2.5)
ALT: 36 U/L — ABNORMAL HIGH (ref 6–29)
AST: 19 U/L (ref 10–35)
Albumin: 4.5 g/dL (ref 3.6–5.1)
Alkaline phosphatase (APISO): 91 U/L (ref 37–153)
BUN: 11 mg/dL (ref 7–25)
CO2: 28 mmol/L (ref 20–32)
Calcium: 10.1 mg/dL (ref 8.6–10.4)
Chloride: 102 mmol/L (ref 98–110)
Creat: 0.52 mg/dL (ref 0.50–1.03)
Globulin: 1.9 g/dL (ref 1.9–3.7)
Glucose, Bld: 91 mg/dL (ref 65–99)
Potassium: 4.5 mmol/L (ref 3.5–5.3)
Sodium: 140 mmol/L (ref 135–146)
Total Bilirubin: 0.2 mg/dL (ref 0.2–1.2)
Total Protein: 6.4 g/dL (ref 6.1–8.1)
eGFR: 108 mL/min/{1.73_m2} (ref >=60)

## 2023-09-24 LAB — CBC WITH DIFFERENTIAL/PLATELET
Absolute Lymphocytes: 1389 {cells}/uL (ref 850–3900)
Absolute Monocytes: 509 {cells}/uL (ref 200–950)
Basophils Absolute: 48 {cells}/uL (ref 0–200)
Basophils Relative: 0.9 %
Eosinophils Absolute: 578 {cells}/uL — ABNORMAL HIGH (ref 15–500)
Eosinophils Relative: 10.9 %
HCT: 42.3 % (ref 35.0–45.0)
Hemoglobin: 13.8 g/dL (ref 11.7–15.5)
MCH: 30.6 pg (ref 27.0–33.0)
MCHC: 32.6 g/dL (ref 32.0–36.0)
MCV: 93.8 fL (ref 80.0–100.0)
MPV: 9.1 fL (ref 7.5–12.5)
Monocytes Relative: 9.6 %
Neutro Abs: 2777 {cells}/uL (ref 1500–7800)
Neutrophils Relative %: 52.4 %
Platelets: 313 10*3/uL (ref 140–400)
RBC: 4.51 10*6/uL (ref 3.80–5.10)
RDW: 11.9 % (ref 11.0–15.0)
Total Lymphocyte: 26.2 %
WBC: 5.3 10*3/uL (ref 3.8–10.8)

## 2023-09-25 NOTE — Progress Notes (Signed)
 Absolute eosinophils are slightly elevated. Rest of CBC WNL.  ALT is borderline elevated-recommend avoiding the use of tylenol and alcohol. Rest of CMP WNL

## 2023-09-26 ENCOUNTER — Other Ambulatory Visit: Payer: Self-pay

## 2023-09-26 ENCOUNTER — Ambulatory Visit: Payer: Medicaid Other | Admitting: Family Medicine

## 2023-09-26 ENCOUNTER — Other Ambulatory Visit: Payer: Self-pay | Admitting: Family Medicine

## 2023-09-26 DIAGNOSIS — M19021 Primary osteoarthritis, right elbow: Secondary | ICD-10-CM | POA: Diagnosis not present

## 2023-09-26 DIAGNOSIS — R918 Other nonspecific abnormal finding of lung field: Secondary | ICD-10-CM | POA: Diagnosis not present

## 2023-09-26 DIAGNOSIS — J441 Chronic obstructive pulmonary disease with (acute) exacerbation: Secondary | ICD-10-CM | POA: Diagnosis not present

## 2023-09-28 ENCOUNTER — Ambulatory Visit: Payer: Medicaid Other | Admitting: Family Medicine

## 2023-09-28 ENCOUNTER — Other Ambulatory Visit: Payer: Self-pay | Admitting: Family Medicine

## 2023-09-28 DIAGNOSIS — M0609 Rheumatoid arthritis without rheumatoid factor, multiple sites: Secondary | ICD-10-CM | POA: Diagnosis not present

## 2023-09-28 DIAGNOSIS — J189 Pneumonia, unspecified organism: Secondary | ICD-10-CM

## 2023-09-28 MED ORDER — AZITHROMYCIN 250 MG PO TABS: ORAL_TABLET | ORAL | 0 refills | Status: DC

## 2023-09-29 ENCOUNTER — Other Ambulatory Visit: Payer: Self-pay

## 2023-09-29 ENCOUNTER — Other Ambulatory Visit: Payer: Self-pay | Admitting: Family Medicine

## 2023-09-29 MED ORDER — OZEMPIC (2 MG/DOSE) 8 MG/3ML ~~LOC~~ SOPN: 2.0000 mg | PEN_INJECTOR | SUBCUTANEOUS | 0 refills | Status: DC

## 2023-09-29 MED ORDER — FLUCONAZOLE 150 MG PO TABS: 150.0000 mg | ORAL_TABLET | Freq: Every day | ORAL | 0 refills | Status: AC

## 2023-10-03 ENCOUNTER — Other Ambulatory Visit: Payer: Self-pay

## 2023-10-03 MED ORDER — OZEMPIC (2 MG/DOSE) 8 MG/3ML ~~LOC~~ SOPN: 2.0000 mg | PEN_INJECTOR | SUBCUTANEOUS | 0 refills | Status: DC

## 2023-10-05 ENCOUNTER — Ambulatory Visit: Payer: Medicaid Other | Admitting: Family Medicine

## 2023-10-05 ENCOUNTER — Encounter: Payer: Self-pay | Admitting: Family Medicine

## 2023-10-05 ENCOUNTER — Other Ambulatory Visit: Payer: Self-pay | Admitting: Family Medicine

## 2023-10-05 VITALS — BP 112/72 | HR 91 | Temp 97.6°F | Ht 66.0 in | Wt 176.0 lb

## 2023-10-05 DIAGNOSIS — R059 Cough, unspecified: Secondary | ICD-10-CM

## 2023-10-05 DIAGNOSIS — H66001 Acute suppurative otitis media without spontaneous rupture of ear drum, right ear: Secondary | ICD-10-CM | POA: Diagnosis not present

## 2023-10-05 DIAGNOSIS — R062 Wheezing: Secondary | ICD-10-CM | POA: Diagnosis not present

## 2023-10-05 DIAGNOSIS — J441 Chronic obstructive pulmonary disease with (acute) exacerbation: Secondary | ICD-10-CM | POA: Diagnosis not present

## 2023-10-05 DIAGNOSIS — R918 Other nonspecific abnormal finding of lung field: Secondary | ICD-10-CM | POA: Diagnosis not present

## 2023-10-05 MED ORDER — FLUCONAZOLE 150 MG PO TABS: 150.0000 mg | ORAL_TABLET | Freq: Every day | ORAL | 0 refills | Status: AC

## 2023-10-05 MED ORDER — GUAIFENESIN ER 600 MG PO TB12: 600.0000 mg | ORAL_TABLET | Freq: Two times a day (BID) | ORAL | Status: DC | PRN

## 2023-10-05 MED ORDER — ALBUTEROL SULFATE HFA 108 (90 BASE) MCG/ACT IN AERS: INHALATION_SPRAY | RESPIRATORY_TRACT | 1 refills | Status: DC

## 2023-10-05 MED ORDER — GUAIFENESIN ER 600 MG PO TB12: 600.0000 mg | ORAL_TABLET | Freq: Two times a day (BID) | ORAL | 1 refills | Status: DC

## 2023-10-05 MED ORDER — AMOXICILLIN-POT CLAVULANATE 875-125 MG PO TABS: 1.0000 | ORAL_TABLET | Freq: Two times a day (BID) | ORAL | 0 refills | Status: DC

## 2023-10-05 NOTE — Assessment & Plan Note (Signed)
 Acute, erythema noted on examination of the right ear, no reported drainage. -Amoxicillin  875 mg PO TWICE A DAY for 10 days -Educated on possible side effects of medication and that she may try a probiotic for GI symptoms  -Diflucan  150 by mouth once as needed for vaginal itching

## 2023-10-05 NOTE — Assessment & Plan Note (Addendum)
 Continue to increase fluids  -guaiFENesin  - 600 mg, Oral, 2 times daily PRN, to loosen phlegm, cough

## 2023-10-05 NOTE — Addendum Note (Signed)
 Addended by: Janece Means on: 10/05/2023 04:34 PM   Modules accepted: Orders

## 2023-10-05 NOTE — Assessment & Plan Note (Signed)
 COPD Exacerbation Persistent productive cough with green sputum, despite recent antibiotic treatment. Pain on the left side, raising concern for possible spread of infection. -Order chest x-ray to assess current state of pneumonia. -Continue Mucinex  as needed for expectoration. -Consider Broncholin honey syrup for additional expectorant support. -If chest x-ray confirms ongoing pneumonia, prescribe second antibiotic (Azithromycin ).

## 2023-10-05 NOTE — Telephone Encounter (Signed)
 [name] spoke with patient and patient agreed to ct. Dr. Reinhold Carbine

## 2023-10-05 NOTE — Assessment & Plan Note (Addendum)
-   Order chest xray R/O pneumonia (left) - albuterol  - INHALE 2 PUFFS INTO LUNGS EVERY 6 TO 8 HOURS AS NEEDED FOR WHEEZING

## 2023-10-05 NOTE — Progress Notes (Addendum)
 Acute Office Visit  Subjective:    Patient ID: [name], female    DOB: 22-Nov-1965, 58 y.o.   MRN: [id number]  Chief Complaint  Patient presents with  . Pneumonia follow up    HPI The patient, with a history of pneumonia, presents with a productive cough producing thick, green colored sputum. The cough has been ongoing, but the patient reports it has lessened in severity. The patient has been using Mucinex  to manage the cough and a nebulizer with a steroid for respiratory support. The patient also reports new onset pain on the left side of the chest, which she worries might indicate the pneumonia has spread from the right side. The patient has a history of rheumatoid arthritis, osteoarthritis, and a back condition. The patient is a former smoker, having smoked only three cigarettes since falling ill.  Past Medical History:  Diagnosis Date  . Allergic rhinitis   . Anginal pain (HCC)   . Arthritis   . Blood transfusion without reported diagnosis   . Cellulitis, face 06/08/2022  . Complication of anesthesia   . COPD (chronic obstructive pulmonary disease) (HCC)   . Dental abscess 06/09/2022  . GERD (gastroesophageal reflux disease)   . History of colon polyps   . History of kidney stones   . HTN (hypertension)   . Hyperlipidemia   . Kidney stones   . Pancreatitis   . Pneumonia   . PONV (postoperative nausea and vomiting)   . Primary hypertension 10/20/2021  . Psychotic depression (HCC)   . Shingles February 26th, 2001  . Tendinitis    both arms    Past Surgical History:  Procedure Laterality Date  . ABDOMINAL HYSTERECTOMY    . BACK SURGERY  02/2023  . CARDIAC CATHETERIZATION  2008  . CESAREAN SECTION    . CHOLECYSTECTOMY    . COLONOSCOPY  05/02/2017   Colonic polyp status post polypectomy. Small internal hemorrhoids  . FRACTURE SURGERY Bilateral    femur  . REPLACEMENT TOTAL KNEE Right 02/09/2019  . TUBAL LIGATION      Family History  Problem Relation  Age of Onset  . Arthritis Mother   . Lung disease Mother   . Parkinson's disease Mother   . COPD Father   . Arthritis Father   . Healthy Sister   . Breast cancer Maternal Aunt        great aunt  . Rheum arthritis Maternal Uncle   . Rheum arthritis Maternal Grandfather   . Healthy Son   . High Cholesterol Son   . Healthy Son   . Colon cancer Cousin        mother's cousin  . Diabetes Other   . Stroke Other   . Hypertension Other   . Hyperlipidemia Other   . Asthma Other   . Heart failure Other   . Thyroid  disease Other   . Heart attack Other   . COPD Other   . Arrhythmia Other   . Arthritis Other   . Migraines Other     Social History   Socioeconomic History  . Marital status: Widowed    Spouse name: Not on file  . Number of children: 2  . Years of education: Not on file  . Highest education level: GED or equivalent  Occupational History  . Not on file  Tobacco Use  . Smoking status: Every Day    Current packs/day: 1.00    Average packs/day: 1 pack/day for 20.0 years (20.0 ttl pk-yrs)  Types: Cigarettes    Passive exposure: Past  . Smokeless tobacco: Never  Vaping Use  . Vaping status: Never Used  Substance and Sexual Activity  . Alcohol use: Yes    Comment: occas  . Drug use: No  . Sexual activity: Not Currently  Other Topics Concern  . Not on file  Social History Narrative  . Not on file   Social Drivers of Health   Financial Resource Strain: Medium Risk (08/05/2023)   Overall Financial Resource Strain (CARDIA)   . Difficulty of Paying Living Expenses: Somewhat hard  Food Insecurity: Food Insecurity Present (08/05/2023)   Hunger Vital Sign   . Worried About Programme researcher, broadcasting/film/video in the Last Year: Often true   . Ran Out of Food in the Last Year: Sometimes true  Transportation Needs: No Transportation Needs (08/05/2023)   PRAPARE - Transportation   . Lack of Transportation (Medical): No   . Lack of Transportation (Non-Medical): No  Physical  Activity: Inactive (08/05/2023)   Exercise Vital Sign   . Days of Exercise per Week: 0 days   . Minutes of Exercise per Session: 0 min  Stress: No Stress Concern Present (08/05/2023)   [employer] of Occupational Health - Occupational Stress Questionnaire   . Feeling of Stress : Only a little  Social Connections: Moderately Integrated (08/05/2023)   Social Connection and Isolation Panel [NHANES]   . Frequency of Communication with Friends and Family: More than three times a week   . Frequency of Social Gatherings with Friends and Family: Twice a week   . Attends Religious Services: More than 4 times per year   . Active Member of Clubs or Organizations: Yes   . Attends Banker Meetings: 1 to 4 times per year   . Marital Status: Widowed  Intimate Partner Violence: Not At Risk (04/12/2023)   Humiliation, Afraid, Rape, and Kick questionnaire   . Fear of Current or Ex-Partner: No   . Emotionally Abused: No   . Physically Abused: No   . Sexually Abused: No    Outpatient Medications Prior to Visit  Medication Sig Dispense Refill  . acetaminophen  (TYLENOL ) 650 MG CR tablet Take 650-1,300 mg by mouth every 8 (eight) hours as needed for pain.    . Albuterol -Budesonide  (AIRSUPRA ) 90-80 MCG/ACT AERO Inhale 1 puff into the lungs every 6 (six) hours as needed.    . ALPRAZolam  (XANAX ) 0.25 MG tablet TAKE 1 TABLET(0.25 MG) BY MOUTH DAILY AS NEEDED FOR ANXIETY 30 tablet 1  . amLODipine  (NORVASC ) 10 MG tablet TAKE 1 TABLET BY MOUTH DAILY 90 tablet 0  . aspirin  EC 81 MG tablet Take 1 tablet (81 mg total) by mouth daily. Swallow whole.    . azelastine  (ASTELIN ) 0.1 % nasal spray Place 2 sprays into both nostrils 2 (two) times daily. Use in each nostril as directed 30 mL 12  . buPROPion  (WELLBUTRIN  XL) 300 MG 24 hr tablet TAKE 1 TABLET(300 MG) BY MOUTH DAILY. 90 tablet 1  . cyclobenzaprine  (FLEXERIL ) 10 MG tablet TAKE 1 TABLET(10 MG) BY MOUTH THREE TIMES DAILY AS NEEDED FOR MUSCLE SPASMS  270 tablet 0  . dicyclomine  (BENTYL ) 10 MG capsule TAKE 1 CAPSULE(10 MG) BY MOUTH FOUR TIMES DAILY BEFORE MEALS AND AT BEDTIME 120 capsule 2  . Docusate Sodium  (DSS) 100 MG CAPS Take 100 mg by mouth daily as needed (constipation).    . famotidine  (PEPCID ) 40 MG tablet Take 1 tablet (40 mg total) by mouth daily as needed  for heartburn or indigestion. 90 tablet 1  . fluticasone  (FLONASE ) 50 MCG/ACT nasal spray SHAKE LIQUID AND USE 2 SPRAYS IN EACH NOSTRIL DAILY 16 g 6  . fluticasone -salmeterol (ADVAIR HFA) 115-21 MCG/ACT inhaler Inhale 2 puffs into the lungs 2 (two) times daily. (Patient taking differently: Inhale 2 puffs into the lungs 2 (two) times daily as needed (asthma).) 3 each 3  . furosemide  (LASIX ) 20 MG tablet Take 1 tablet (20 mg total) by mouth daily as needed for edema. 1 tablet 0  . gabapentin  (NEURONTIN ) 300 MG capsule Take 300 mg by mouth 3 (three) times daily.    . HYDROcodone -acetaminophen  (NORCO/VICODIN) 5-325 MG tablet Take 1-2 tablets by mouth every 6 (six) hours as needed.    . hydroxychloroquine  (PLAQUENIL ) 200 MG tablet Take one tablet by mouth twice daily MONDAYS THROUGH FRIDAYS ONLY. Do not take on Saturday and Sunday 120 tablet 0  . levocetirizine (XYZAL ) 5 MG tablet TAKE 1 TABLET(5 MG) BY MOUTH EVERY EVENING 90 tablet 0  . MAGNESIUM PO Take 500 mg by mouth daily.    . meloxicam (MOBIC) 15 MG tablet Take 15 mg by mouth daily.    . montelukast  (SINGULAIR ) 10 MG tablet TAKE 1 TABLET BY MOUTH AT BEDTIME 90 tablet 0  . nicotine  (NICODERM CQ  - DOSED IN MG/24 HOURS) 21 mg/24hr patch Place 1 patch (21 mg total) onto the skin daily. 28 patch 0  . nitroGLYCERIN  (NITROSTAT ) 0.4 MG SL tablet Place 1 tablet (0.4 mg total) under the tongue every 5 (five) minutes as needed for chest pain. 50 tablet 3  . omeprazole  (PRILOSEC) 40 MG capsule TAKE 1 CAPSULE(40 MG) BY MOUTH DAILY 90 capsule 3  . REPATHA  SURECLICK 140 MG/ML SOAJ ADMINISTER 1 ML(140MG ) UNDER THE SKIN EVERY 14 DAYS AS DIRECTED 2  mL 2  . Semaglutide , 2 MG/DOSE, (OZEMPIC , 2 MG/DOSE,) 8 MG/3ML SOPN Inject 2 mg into the skin once a week. 9 mL 0  . triamcinolone  (KENALOG ) 0.025 % ointment Apply 1 Application topically 2 (two) times daily. Apply to corners of mouth twice daily 30 g 0  . albuterol  (VENTOLIN  HFA) 108 (90 Base) MCG/ACT inhaler INHALE 2 PUFFS INTO LUNGS EVERY 6 TO 8 HOURS AS NEEDED FOR WHEEZING 18 g 1  . amoxicillin -clavulanate (AUGMENTIN ) 875-125 MG tablet Take 1 tablet by mouth 2 (two) times daily. 20 tablet 0  . azithromycin  (ZITHROMAX ) 250 MG tablet 2 DAILY FOR FIRST DAY, THEN DECREASE TO ONE DAILY FOR 4 MORE DAYS. 6 tablet 0  . nicotine  (NICODERM CQ  - DOSED IN MG/24 HOURS) 21 mg/24hr patch APPLY 1 PATCH(21 MG) TOPICALLY TO THE SKIN DAILY 28 patch 0  . ipratropium-albuterol  (DUONEB) 0.5-2.5 (3) MG/3ML nebulizer solution 3 mL      No facility-administered medications prior to visit.    Allergies  Allergen Reactions  . Levofloxacin Other (See Comments) and Rash    Rash all over redness  . Bactrim  Ds [Sulfamethoxazole -Trimethoprim ] Nausea Only  . Lipitor [Atorvastatin ] Other (See Comments)    Myalgia   . Tape Hives  . Zetia [Ezetimibe]     Muscle pain  . Codeine Nausea Only  . Other Rash    bandaids leave a red area if left on too long    Review of Systems  Constitutional:  Negative for appetite change, fatigue and fever.  HENT:  Positive for congestion and sore throat (litlle sore). Negative for ear pain and sinus pressure.   Respiratory:  Positive for cough (productive green sputum). Negative for chest tightness,  shortness of breath and wheezing.   Cardiovascular:  Negative for chest pain and palpitations.  Gastrointestinal:  Negative for abdominal pain, constipation, diarrhea, nausea and vomiting.  Genitourinary:  Negative for dysuria and hematuria.  Musculoskeletal:  Positive for back pain (Left side back pain). Negative for arthralgias, joint swelling and myalgias.  Skin:  Negative for rash.   Neurological:  Negative for dizziness, weakness and headaches.  Psychiatric/Behavioral:  Negative for dysphoric mood. The patient is not nervous/anxious.        Objective:    Physical Exam Vitals reviewed.  Constitutional:      General: She is not in acute distress.    Appearance: Normal appearance.  HENT:     Left Ear: Tympanic membrane normal.     Mouth/Throat:     Pharynx: Posterior oropharyngeal erythema present.  Eyes:     Conjunctiva/sclera: Conjunctivae normal.  Neck:     Vascular: No carotid bruit.  Cardiovascular:     Rate and Rhythm: Normal rate and regular rhythm.     Heart sounds: Normal heart sounds.  Pulmonary:     Effort: Pulmonary effort is normal.     Breath sounds: Normal breath sounds.  Abdominal:     General: Bowel sounds are normal.     Palpations: Abdomen is soft.     Tenderness: There is no abdominal tenderness.  Neurological:     Mental Status: She is alert. Mental status is at baseline.  Psychiatric:        Mood and Affect: Mood normal.        Behavior: Behavior normal.    BP 112/72   Pulse 91   Temp 97.6 F (36.4 C) (Temporal)   Ht 5\' 6"  (1.676 m)   Wt 176 lb (79.8 kg)   LMP  (LMP Unknown) Comment: hysterectomy 2008  SpO2 100%   BMI 28.41 kg/m  Wt Readings from Last 3 Encounters:  10/05/23 176 lb (79.8 kg)  09/23/23 174 lb (78.9 kg)  09/22/23 171 lb 9.6 oz (77.8 kg)    Health Maintenance Due  Topic Date Due  . Cervical Cancer Screening (HPV/Pap Cotest)  04/06/2019  . Lung Cancer Screening  12/31/2022    There are no preventive care reminders to display for this patient.   Lab Results  Component Value Date   TSH 0.955 08/05/2023   Lab Results  Component Value Date   WBC 5.3 09/23/2023   HGB 13.8 09/23/2023   HCT 42.3 09/23/2023   MCV 93.8 09/23/2023   PLT 313 09/23/2023   Lab Results  Component Value Date   NA 140 09/23/2023   K 4.5 09/23/2023   CO2 28 09/23/2023   GLUCOSE 91 09/23/2023   BUN 11 09/23/2023    CREATININE 0.52 09/23/2023   BILITOT 0.2 09/23/2023   ALKPHOS 114 08/05/2023   AST 19 09/23/2023   ALT 36 (H) 09/23/2023   PROT 6.4 09/23/2023   ALBUMIN 4.7 08/05/2023   CALCIUM  10.1 09/23/2023   ANIONGAP 9 02/21/2023   EGFR 108 09/23/2023   Lab Results  Component Value Date   CHOL 187 08/05/2023   Lab Results  Component Value Date   HDL 79 08/05/2023   Lab Results  Component Value Date   LDLCALC 83 08/05/2023   Lab Results  Component Value Date   TRIG 151 (H) 08/05/2023   Lab Results  Component Value Date   CHOLHDL 2.4 08/05/2023   Lab Results  Component Value Date   HGBA1C 5.4 08/05/2023  Assessment & Plan:   There are no diagnoses linked to this encounter.  Non-recurrent acute suppurative otitis media of right ear without spontaneous rupture of tympanic membrane Assessment & Plan: Acute, erythema noted on examination of the right ear, no reported drainage. -Amoxicillin  875 mg PO TWICE A DAY for 10 days -Educated on possible side effects of medication and that she may try a probiotic for GI symptoms  -Diflucan  150 by mouth once as needed for vaginal itching  Orders: -     Amoxicillin -Pot Clavulanate; Take 1 tablet by mouth 2 (two) times daily.  Dispense: 20 tablet; Refill: 0 -     Fluconazole ; Take 1 tablet (150 mg total) by mouth daily for 1 dose.  Dispense: 1 tablet; Refill: 0  COPD exacerbation (HCC) Assessment & Plan: COPD Exacerbation Persistent productive cough with green sputum, despite recent antibiotic treatment. Pain on the left side, raising concern for possible spread of infection. -Order chest x-ray to assess current state of pneumonia. -Continue Mucinex  as needed for expectoration. -Consider Broncholin honey syrup for additional expectorant support. -If chest x-ray confirms ongoing pneumonia, prescribe second antibiotic (Azithromycin ).  Orders: -     DG Chest 2 View; Future  Wheezing Assessment & Plan: - Order chest xray R/O  pneumonia (left) - albuterol  - INHALE 2 PUFFS INTO LUNGS EVERY 6 TO 8 HOURS AS NEEDED FOR WHEEZING    Orders: -     DG Chest 2 View; Future -     Albuterol  Sulfate HFA; INHALE 2 PUFFS INTO LUNGS EVERY 6 TO 8 HOURS AS NEEDED FOR WHEEZING  Dispense: 18 g; Refill: 1  Cough in adult Assessment & Plan: Continue to increase fluids  -guaiFENesin  - 600 mg, Oral, 2 times daily PRN, to loosen phlegm, cough  Orders: -     guaiFENesin  ER; Take 1 tablet (600 mg total) by mouth 2 (two) times daily.  Dispense: 60 tablet; Refill: 1  Opacities of both lungs present on chest x-ray Assessment & Plan: CT ordered, Await testing for assessment and recommendations   Orders: -     CT CHEST LUNG CANCER SCREENING LOW DOSE WO CONTRAST; Future    Meds ordered this encounter  Medications  . amoxicillin -clavulanate (AUGMENTIN ) 875-125 MG tablet    Sig: Take 1 tablet by mouth 2 (two) times daily.    Dispense:  20 tablet    Refill:  0  . fluconazole  (DIFLUCAN ) 150 MG tablet    Sig: Take 1 tablet (150 mg total) by mouth daily for 1 dose.    Dispense:  1 tablet    Refill:  0  . DISCONTD: guaiFENesin  (MUCINEX ) 12 hr tablet 600 mg  . albuterol  (VENTOLIN  HFA) 108 (90 Base) MCG/ACT inhaler    Sig: INHALE 2 PUFFS INTO LUNGS EVERY 6 TO 8 HOURS AS NEEDED FOR WHEEZING    Dispense:  18 g    Refill:  1  . guaiFENesin  (MUCINEX ) 600 MG 12 hr tablet    Sig: Take 1 tablet (600 mg total) by mouth 2 (two) times daily.    Dispense:  60 tablet    Refill:  1   Follow-up: PRN if symptoms worsen or fail to improve  Total time spent on today's visit was 36 minutes, including both face-to-face time and nonface-to-face time personally spent on review of chart (labs and imaging), discussing labs and goals, discussing further work-up, treatment options, referrals to specialist if needed, reviewing outside records if pertinent, answering patient's questions, and coordinating care.    Steele Edelson  Howard Macho, FNP [facility] [phone]

## 2023-10-05 NOTE — Assessment & Plan Note (Signed)
 CT ordered, Await testing for assessment and recommendations

## 2023-10-05 NOTE — Addendum Note (Signed)
 Addended by: Janece Means on: 10/05/2023 02:09 PM   Modules accepted: Orders

## 2023-10-07 ENCOUNTER — Other Ambulatory Visit: Payer: Self-pay | Admitting: Family Medicine

## 2023-10-07 DIAGNOSIS — J189 Pneumonia, unspecified organism: Secondary | ICD-10-CM

## 2023-10-11 ENCOUNTER — Telehealth: Payer: Self-pay | Admitting: Rheumatology

## 2023-10-11 DIAGNOSIS — R911 Solitary pulmonary nodule: Secondary | ICD-10-CM | POA: Diagnosis not present

## 2023-10-11 DIAGNOSIS — Z87891 Personal history of nicotine dependence: Secondary | ICD-10-CM | POA: Diagnosis not present

## 2023-10-11 NOTE — Telephone Encounter (Signed)
Does she want to schedule a sooner visit to discuss other treatment options or just discuss at her upcoming visit in march?

## 2023-10-11 NOTE — Telephone Encounter (Signed)
Pt would like to let Dr. Corliss Skains know that she stopped taking her hydroxychloroquine due to her gaining weight and making her not feel well.

## 2023-10-11 NOTE — Telephone Encounter (Signed)
Reached out to patient and advised we could schedule a sooner office visit to discuss treatment options. Patient scheduled for 10/13/2023 at 9:30 am.

## 2023-10-13 ENCOUNTER — Encounter: Payer: Medicaid Other | Admitting: Rheumatology

## 2023-10-13 ENCOUNTER — Ambulatory Visit: Payer: Medicaid Other | Admitting: Physician Assistant

## 2023-10-13 ENCOUNTER — Encounter: Payer: Self-pay | Admitting: Family Medicine

## 2023-10-17 NOTE — Progress Notes (Signed)
 Office Visit Note  Patient: [name]             Date of Birth: [birth year]           MRN: [id number]             PCP: Mercy Stall, MD Referring: Mercy Stall, MD Visit Date: 10/31/2023 Occupation: @GUAROCC @  Subjective:  Discuss medication options  History of Present Illness: [name] is a 58 y.o. female with history of seronegative rheumatoid arthritis and osteoarthritis.  Patient was prescribed Plaquenil  200 mg 1 tablet by mouth twice a day Monday-Friday but she has discontinued about 2 weeks ago due to the concern for weight gain.  Patient states that since discontinuing Plaquenil  she has had increased pain and stiffness involving both hands, both elbows, and her right ankle and foot.  Patient states that she had a left wrist injection since her last office visit which only provided relief for about 2 days.  She is scheduled for nerve conduction study in March 2025.  She has had injections in her elbow in the past which provided minimal to no relief.  Patient presented today to discuss treatment alternatives.    Activities of Daily Living:  Patient reports morning stiffness for 45-60 minutes.   Patient Reports nocturnal pain.  Difficulty dressing/grooming: Denies Difficulty climbing stairs: Reports Difficulty getting out of chair: Denies Difficulty using hands for taps, buttons, cutlery, and/or writing: Reports  Review of Systems  Constitutional:  Positive for fatigue.  HENT:  Positive for mouth sores and mouth dryness.   Eyes:  Positive for dryness.  Respiratory:  Negative for shortness of breath.   Cardiovascular:  Negative for chest pain and palpitations.  Gastrointestinal:  Positive for constipation. Negative for blood in stool and diarrhea.  Endocrine: Negative for increased urination.  Genitourinary:  Negative for involuntary urination.  Musculoskeletal:  Positive for joint pain, gait problem, joint pain, joint swelling, myalgias, muscle weakness, morning  stiffness, muscle tenderness and myalgias.  Skin:  Negative for color change, rash, hair loss and sensitivity to sunlight.  Allergic/Immunologic: Positive for susceptible to infections.  Neurological:  Positive for headaches. Negative for dizziness.  Hematological:  Negative for swollen glands.  Psychiatric/Behavioral:  Positive for depressed mood and sleep disturbance. The patient is not nervous/anxious.     PMFS History:  Patient Active Problem List   Diagnosis Date Noted  . Elevated liver enzymes 10/25/2023  . Acute right flank pain 10/25/2023  . Frequency of urination 10/25/2023  . Wheezing 10/05/2023  . Non-recurrent acute suppurative otitis media of right ear without spontaneous rupture of tympanic membrane 10/05/2023  . Opacities of both lungs present on chest x-ray 10/05/2023  . Tinnitus 09/08/2023  . Non-seasonal allergic rhinitis due to pollen 09/08/2023  . Seasonal allergic rhinitis due to pollen 06/16/2023  . Greater trochanteric bursitis of left hip 04/12/2023  . Degenerative scoliosis in adult patient 02/28/2023  . Aspirin -like platelet function defect (HCC) 01/13/2023  . Dental caries 01/04/2023  . History of blood transfusion 01/04/2023  . Failure of dental implant due to infection 01/04/2023  . Acute recurrent maxillary sinusitis 12/09/2022  . Lumbar back pain 09/14/2022  . Tobacco abuse 06/09/2022  . Aortic atherosclerosis (HCC) 05/08/2022  . Chronic midline low back pain without sciatica 01/22/2022  . Myalgia due to statin 01/22/2022  . GERD (gastroesophageal reflux disease) 01/22/2022  . Overweight with body mass index (BMI) of 28 to 28.9 in adult 12/31/2021  . COPD exacerbation (HCC) 11/22/2021  .  Acute recurrent frontal sinusitis 10/20/2021  . Prediabetes 10/20/2021  . Migraine 10/20/2021  . Primary hypertension 10/20/2021  . COVID-19 virus infection 10/20/2021  . Sore throat 10/01/2020  . Cough in adult 10/01/2020  . DDD (degenerative disc disease),  lumbar 06/17/2020  . Neuropathy 04/08/2020  . Simple chronic bronchitis (HCC) 01/06/2020  . Other fatigue 01/01/2020  . Spontaneous bruising 01/01/2020  . Cigarette nicotine  dependence with nicotine -induced disorder 01/01/2020  . Mixed hyperlipidemia 01/01/2020  . GAD (generalized anxiety disorder) 01/01/2020  . Smoking 06/05/2019  . Prinzmetal angina (HCC) 06/05/2019  . Primary osteoarthritis of one knee, right 02/09/2019  . Pain of left hip joint 12/19/2018  . Osteoarthritis of carpometacarpal [facility]) joint of thumb 11/20/2018    Past Medical History:  Diagnosis Date  . Allergic rhinitis   . Anginal pain (HCC)   . Arthritis   . Blood transfusion without reported diagnosis   . Cellulitis, face 06/08/2022  . Complication of anesthesia   . COPD (chronic obstructive pulmonary disease) (HCC)   . Dental abscess 06/09/2022  . GERD (gastroesophageal reflux disease)   . History of colon polyps   . History of kidney stones   . HTN (hypertension)   . Hyperlipidemia   . Kidney stones   . Pancreatitis   . Pneumonia   . PONV (postoperative nausea and vomiting)   . Primary hypertension 10/20/2021  . Psychotic depression (HCC)   . Shingles February 26th, 2001  . Tendinitis    both arms    Family History  Problem Relation Age of Onset  . Arthritis Mother   . Lung disease Mother   . Parkinson's disease Mother   . COPD Father   . Arthritis Father   . Healthy Sister   . Breast cancer Maternal Aunt        great aunt  . Rheum arthritis Maternal Uncle   . Rheum arthritis Maternal Grandfather   . Healthy Son   . High Cholesterol Son   . Healthy Son   . Colon cancer Cousin        mother's cousin  . Diabetes Other   . Stroke Other   . Hypertension Other   . Hyperlipidemia Other   . Asthma Other   . Heart failure Other   . Thyroid  disease Other   . Heart attack Other   . COPD Other   . Arrhythmia Other   . Arthritis Other   . Migraines Other    Past Surgical History:   Procedure Laterality Date  . ABDOMINAL HYSTERECTOMY    . BACK SURGERY  02/2023  . CARDIAC CATHETERIZATION  2008  . CESAREAN SECTION    . CHOLECYSTECTOMY    . COLONOSCOPY  05/02/2017   Colonic polyp status post polypectomy. Small internal hemorrhoids  . FRACTURE SURGERY Bilateral    femur  . REPLACEMENT TOTAL KNEE Right 02/09/2019  . TUBAL LIGATION     Social History   Social History Narrative  . Not on file   Immunization History  Administered Date(s) Administered  . Influenza,inj,Quad PF,6+ Mos 06/27/2018  . Influenza-Unspecified 07/08/2017, 08/09/2022  . PNEUMOCOCCAL CONJUGATE-20 05/06/2022  . Tdap 12/01/2021  . Zoster Recombinant(Shingrix) 09/14/2017, 05/05/2018     Objective: Vital Signs: BP 135/78 (BP Location: Left Arm, Patient Position: Sitting, Cuff Size: Normal)   Pulse 82   Resp 14   Ht 5\' 6"  (1.676 m)   Wt 177 lb (80.3 kg)   LMP  (LMP Unknown) Comment: hysterectomy 2008  BMI 28.57 kg/m  Physical Exam Vitals and nursing note reviewed.  Constitutional:      Appearance: She is well-developed.  HENT:     Head: Normocephalic and atraumatic.  Eyes:     Conjunctiva/sclera: Conjunctivae normal.  Cardiovascular:     Rate and Rhythm: Normal rate and regular rhythm.     Heart sounds: Normal heart sounds.  Pulmonary:     Effort: Pulmonary effort is normal.     Breath sounds: Normal breath sounds.  Abdominal:     General: Bowel sounds are normal.     Palpations: Abdomen is soft.  Musculoskeletal:     Cervical back: Normal range of motion.  Lymphadenopathy:     Cervical: No cervical adenopathy.  Skin:    General: Skin is warm and dry.     Capillary Refill: Capillary refill takes less than 2 seconds.  Neurological:     Mental Status: She is alert and oriented to person, place, and time.  Psychiatric:        Behavior: Behavior normal.     Musculoskeletal Exam: C-spine has good range of motion.  Limited mobility of the lumbar spine.  Shoulder joints  have good range of motion.  Tenderness over the lateral epicondyle of both elbows.  Tenderness of the left wrist joint noted.  Tenderness over the right second and third MCP joints.  PIP and DIP thickening noted.  Hip joints have good range of motion no groin pain.  Right knee replacement has good range of motion.  Ankle joints have good range of motion with no tenderness or joint swelling.  CDAI Exam: CDAI Score: -- Patient Global: --; Provider Global: -- Swollen: --; Tender: -- Joint Exam 10/31/2023   No joint exam has been documented for this visit   There is currently no information documented on the homunculus. Go to the Rheumatology activity and complete the homunculus joint exam.  Investigation: No additional findings.  Imaging: US  Abdomen Limited RUQ (LIVER/GB) Result Date: 10/26/2023 CLINICAL DATA:  Elevated liver function test.  Right flank pain. EXAM: ULTRASOUND ABDOMEN LIMITED RIGHT UPPER QUADRANT COMPARISON:  May 20, 2018. FINDINGS: Gallbladder: Status post cholecystectomy. Common bile duct: Diameter: 7 mm which is within normal limits for post cholecystectomy status. Liver: No focal lesion identified. Within normal limits in parenchymal echogenicity. Portal vein is patent on color Doppler imaging with normal direction of blood flow towards the liver. Other: None. IMPRESSION: Status post cholecystectomy. No acute abnormality seen in the right upper quadrant of the abdomen. Electronically Signed   By: Rosalene Colon M.D.   On: 10/26/2023 11:52    Recent Labs: Lab Results  Component Value Date   WBC 5.3 09/23/2023   HGB 13.8 09/23/2023   PLT 313 09/23/2023   NA 140 09/23/2023   K 4.5 09/23/2023   CL 102 09/23/2023   CO2 28 09/23/2023   GLUCOSE 91 09/23/2023   BUN 11 09/23/2023   CREATININE 0.52 09/23/2023   BILITOT 0.2 09/23/2023   ALKPHOS 114 08/05/2023   AST 19 09/23/2023   ALT 36 (H) 09/23/2023   PROT 6.4 09/23/2023   ALBUMIN 4.7 08/05/2023   CALCIUM  10.1  09/23/2023   GFRAA 122 10/28/2020    Speciality Comments: PLQ Eye Exam: 09/28/2023 WNL @ [facility] follow up in 1 year.   Procedures:  No procedures performed Allergies: Levofloxacin, Bactrim  ds [sulfamethoxazole -trimethoprim ], Lipitor [atorvastatin ], Tape, Zetia [ezetimibe], Codeine, and Other     Assessment / Plan:     Visit Diagnoses: Seronegative rheumatoid arthritis (HCC) -  Anti-CCP negative, RF negative, ESR within normal limits on 07/12/2023, ultrasound of both hands positive for synovitis on 08/04/2023: Patient presents today experiencing increased pain and stiffness involving multiple joints.  Her symptoms have been most severe involving the left wrist, both hands, and the right ankle and foot.  She was previously started on Plaquenil  200 mg 1 tablet by mouth twice daily Monday through Friday.  She discontinued Plaquenil  about 2 weeks ago due to the concern for increased weight gain.  Since discontinuing Plaquenil  she has had an exacerbation of symptoms and presented today to discuss treatment alternatives.  She is not a good candidate for methotrexate or Arava due to recent elevation of ALT and concurrent use of meloxicam and tylenol /norco for pain relief.  Briefly discussed the use of Biologics but she is apprehensive of possible side effects.  Patient would like to resume Plaquenil  as prescribed and will continue to monitor her weight closely.  She is advised to notify us  if she develops any other new or worsening side effects.  A refill of Plaquenil  sent to the pharmacy today.  Patient plans on keeping her follow-up visit scheduled on 12/08/2023 to assess her response to restarting plaquenil .   - Plan: hydroxychloroquine  (PLAQUENIL ) 200 MG tablet  High risk medication use - Plaquenil  200 mg 1 tablet by mouth twice daily Monday through Friday. PLQ Eye Exam: 09/28/2023 WNL @ [facility] follow up in 1 year.  CBC and CMP updated on 09/23/23.  ALT was 36 on 09/23/2023.   Patient takes meloxicam and Tylenol /hydrocodone  for pain relief.   Not a good candidate for methotrexate/arava at this time.  She will require updated lab work in April and every 5 months to monitor for drug toxicity.  - Plan: hydroxychloroquine  (PLAQUENIL ) 200 MG tablet  Pain in both hands - X-rays of both hands were consistent with osteoarthritis and possible inflammatory arthritis on 07/12/2023.  Patient presents today with increased pain in her right index and middle finger.  She has tenderness over the right second and third MCP joints on examination today.  She is also having ongoing discomfort in the left wrist-inadequate response to cortisone injection.  Patient will be resuming Plaquenil  as discussed above.  Primary osteoarthritis of both hands - X-rays of both hands were consistent with osteoarthritis and inflammatory arthritis overlap.  Lateral epicondylitis of both elbows: She continues to have tenderness over the lateral epicondyle of both elbows.  Inadequate response to cortisone injections in the past.  Patient was encouraged to perform home exercises daily. Discussed the option of PT as well.   Synovial cyst of popliteal space, unspecified laterality  Status post total knee replacement, right - 2020 right total knee replacement by Dr. Sulema Endo. Doing well.  Good ROM with no effusion.   Pes cavus of both feet  Primary osteoarthritis of both feet - X-rays of both feet were obtained on 12/14/2022.  She has been experiencing intermittent discomfort in her right ankle and foot.  No synovitis was noted today.  She will resuming plaquenil  as prescribed.   Degeneration of intervertebral disc of lumbar region without discogenic back pain or lower extremity pain - S/p laminectomy September 20, 2022.PLIF L3-L4, L4-L5 by Dr. Larrie Po February 28, 2023. Chronic pain.   Trochanteric bursitis of both hips: Intermittent discomfort.   Other idiopathic scoliosis, lumbar region: Chronic pain   Other medical  conditions are listed as follows:  Primary hypertension: Blood pressure was 135/78 today in the office.  History of COPD  Aortic  atherosclerosis (HCC)  Mixed hyperlipidemia  Prediabetes  Tobacco abuse  Gastroesophageal reflux disease without esophagitis  GAD (generalized anxiety disorder)  Other migraine without status migrainosus, not intractable  Seasonal allergic rhinitis due to pollen  Family history of rheumatoid arthritis-maternal uncle and maternal grandfather  Family history of psoriasis-son and granddaughter   Orders: No orders of the defined types were placed in this encounter.  Meds ordered this encounter  Medications  . hydroxychloroquine  (PLAQUENIL ) 200 MG tablet    Sig: TAKE 1 TABLET BY MOUTH TWICE DAILY ON MONDAYS THROUGH FRIDAYS ONLY, DO NOT TAKE ON SATURDAY AND SUNDAY    Dispense:  120 tablet    Refill:  0   Follow-Up Instructions: Return for Rheumatoid arthritis.   Romayne Clubs, PA-C  Note - This record has been created using Dragon software.  Chart creation errors have been sought, but may not always  have been located. Such creation errors do not reflect on  the standard of medical care.

## 2023-10-19 ENCOUNTER — Other Ambulatory Visit: Payer: Self-pay | Admitting: Family Medicine

## 2023-10-19 DIAGNOSIS — R062 Wheezing: Secondary | ICD-10-CM

## 2023-10-20 ENCOUNTER — Other Ambulatory Visit: Payer: Self-pay | Admitting: Family Medicine

## 2023-10-20 DIAGNOSIS — J309 Allergic rhinitis, unspecified: Secondary | ICD-10-CM

## 2023-10-25 ENCOUNTER — Telehealth: Payer: Self-pay

## 2023-10-25 ENCOUNTER — Encounter: Payer: Self-pay | Admitting: Family Medicine

## 2023-10-25 ENCOUNTER — Ambulatory Visit: Payer: Medicaid Other | Admitting: Family Medicine

## 2023-10-25 ENCOUNTER — Other Ambulatory Visit: Payer: Self-pay | Admitting: Rheumatology

## 2023-10-25 VITALS — BP 112/64 | HR 80 | Temp 98.5°F | Resp 16 | Ht 66.0 in | Wt 178.8 lb

## 2023-10-25 DIAGNOSIS — R109 Unspecified abdominal pain: Secondary | ICD-10-CM | POA: Diagnosis not present

## 2023-10-25 DIAGNOSIS — R748 Abnormal levels of other serum enzymes: Secondary | ICD-10-CM | POA: Insufficient documentation

## 2023-10-25 DIAGNOSIS — M0600 Rheumatoid arthritis without rheumatoid factor, unspecified site: Secondary | ICD-10-CM

## 2023-10-25 DIAGNOSIS — R350 Frequency of micturition: Secondary | ICD-10-CM | POA: Diagnosis not present

## 2023-10-25 DIAGNOSIS — R7303 Prediabetes: Secondary | ICD-10-CM

## 2023-10-25 DIAGNOSIS — Z79899 Other long term (current) drug therapy: Secondary | ICD-10-CM

## 2023-10-25 LAB — POCT URINALYSIS DIP (CLINITEK)
Bilirubin, UA: NEGATIVE
Blood, UA: NEGATIVE
Glucose, UA: NEGATIVE mg/dL
Ketones, POC UA: NEGATIVE mg/dL
Leukocytes, UA: NEGATIVE
Nitrite, UA: NEGATIVE
POC PROTEIN,UA: NEGATIVE
Spec Grav, UA: 1.01 (ref 1.010–1.025)
Urobilinogen, UA: 0.2 U/dL — AB
pH, UA: 7 (ref 5.0–8.0)

## 2023-10-25 MED ORDER — METFORMIN HCL 500 MG PO TABS: 500.0000 mg | ORAL_TABLET | Freq: Two times a day (BID) | ORAL | 3 refills | Status: DC

## 2023-10-25 NOTE — Assessment & Plan Note (Signed)
Patient on Ozempic, but not experiencing weight loss. -Add Metformin 500mg  twice daily with meals to current regimen. -Continue Ozempic 2 mg injection SQ weekly

## 2023-10-25 NOTE — Telephone Encounter (Signed)
 Last Fill: 08/04/2023  Eye exam: 09/28/2023 WNL    Labs: 09/23/2023 Absolute eosinophils are slightly elevated. Rest of CBC WNL. ALT is borderline elevated Rest of CMP WNL   Next Visit: 10/31/2023  Last Visit: 09/23/2023  IK:Dzmnwzhjupcz rheumatoid arthritis   Current Dose per office note 09/23/2023: Plaquenil  200 mg 1 tablet by mouth twice daily Monday through Friday.   Okay to refill Plaquenil ?

## 2023-10-25 NOTE — Progress Notes (Signed)
 Subjective:  Patient ID: [name], female    DOB: 01/03/1966  Age: 58 y.o. MRN: [id number]  Chief Complaint  Patient presents with   Flank Pain    HPI [name] [name] is a 58 year old female with prediabetes who presents with persistent right-sided abdominal pain.  She has been experiencing persistent right-sided abdominal pain for approximately three months. The pain is described as a constant dull ache, localized to the soft tissue area on the right side, and is not associated with twisting movements. The pain is present most of the time and is not relieved by pressing on the area. She has a history of a partial hysterectomy with one ovary remaining.  She is prediabetic and has been on the highest dose of Ozempic , but reports gaining weight instead of losing it, with a weight gain of about 20 pounds. Her A1c was previously borderline at 5.8-5.9. She is concerned about the lack of efficacy of her current medication.  She started taking hydroxychloroquine  in November but stopped about a week ago due to side effects, including weight gain and vision changes. Her liver enzyme ALT was slightly elevated in January, and she has not had any imaging studies of her liver before. No regular alcohol consumption, noting only occasional intake.       08/05/2023    7:44 AM 04/12/2023    7:47 AM 06/24/2022    2:28 PM  Depression screen PHQ 2/9  Decreased Interest 1 1 0  Down, Depressed, Hopeless 1 1 0  PHQ - 2 Score 2 2 0  Altered sleeping 2 2   Tired, decreased energy 1 2   Change in appetite 1 0   Feeling bad or failure about yourself  1 1   Trouble concentrating 1 0   Moving slowly or fidgety/restless 0 0   Suicidal thoughts 0 0   PHQ-9 Score 8 7   Difficult doing work/chores Not difficult at all Not difficult at all         08/05/2023    7:44 AM  Fall Risk   Falls in the past year? 0  Number falls in past yr: 0  Follow up Falls evaluation completed     Patient Care Team: Sherre Clapper, MD as PCP - General (Family Medicine) Isidor Somerset, MD as Referring Physician (Internal Medicine)   Review of Systems  Constitutional:  Negative for appetite change, fatigue and fever.  HENT:  Negative for congestion, ear pain, sinus pressure and sore throat.   Respiratory:  Negative for cough, chest tightness, shortness of breath and wheezing.   Cardiovascular:  Negative for chest pain and palpitations.  Gastrointestinal:  Negative for abdominal pain, constipation, diarrhea, nausea and vomiting.  Genitourinary:  Positive for flank pain and urgency. Negative for dysuria and hematuria.  Musculoskeletal:  Negative for arthralgias, back pain, joint swelling and myalgias.  Skin:  Negative for rash.  Neurological:  Negative for dizziness, weakness and headaches.  Psychiatric/Behavioral:  Negative for dysphoric mood. The patient is not nervous/anxious.     Current Outpatient Medications on File Prior to Visit  Medication Sig Dispense Refill   acetaminophen  (TYLENOL ) 650 MG CR tablet Take 650-1,300 mg by mouth every 8 (eight) hours as needed for pain.     albuterol  (VENTOLIN  HFA) 108 (90 Base) MCG/ACT inhaler INHALE 2 PUFFS INTO LUNGS EVERY 6 TO 8 HOURS AS NEEDED FOR WHEEZING 18 g 1   Albuterol -Budesonide  (AIRSUPRA ) 90-80 MCG/ACT AERO Inhale 1 puff into the  lungs every 6 (six) hours as needed.     ALPRAZolam  (XANAX ) 0.25 MG tablet TAKE 1 TABLET(0.25 MG) BY MOUTH DAILY AS NEEDED FOR ANXIETY 30 tablet 1   amLODipine  (NORVASC ) 10 MG tablet TAKE 1 TABLET BY MOUTH DAILY 90 tablet 0   aspirin  EC 81 MG tablet Take 1 tablet (81 mg total) by mouth daily. Swallow whole.     azelastine  (ASTELIN ) 0.1 % nasal spray Place 2 sprays into both nostrils 2 (two) times daily. Use in each nostril as directed 30 mL 12   buPROPion  (WELLBUTRIN  XL) 300 MG 24 hr tablet TAKE 1 TABLET(300 MG) BY MOUTH DAILY. 90 tablet 1   cyclobenzaprine  (FLEXERIL ) 10 MG tablet TAKE 1 TABLET(10  MG) BY MOUTH THREE TIMES DAILY AS NEEDED FOR MUSCLE SPASMS 270 tablet 0   dicyclomine  (BENTYL ) 10 MG capsule TAKE 1 CAPSULE(10 MG) BY MOUTH FOUR TIMES DAILY BEFORE MEALS AND AT BEDTIME 120 capsule 2   Docusate Sodium  (DSS) 100 MG CAPS Take 100 mg by mouth daily as needed (constipation).     famotidine  (PEPCID ) 40 MG tablet Take 1 tablet (40 mg total) by mouth daily as needed for heartburn or indigestion. 90 tablet 1   fluticasone  (FLONASE ) 50 MCG/ACT nasal spray SHAKE LIQUID AND USE 2 SPRAYS IN EACH NOSTRIL DAILY 16 g 6   fluticasone -salmeterol (ADVAIR HFA) 115-21 MCG/ACT inhaler Inhale 2 puffs into the lungs 2 (two) times daily. (Patient taking differently: Inhale 2 puffs into the lungs 2 (two) times daily as needed (asthma).) 3 each 3   furosemide  (LASIX ) 20 MG tablet Take 1 tablet (20 mg total) by mouth daily as needed for edema. 1 tablet 0   gabapentin  (NEURONTIN ) 300 MG capsule Take 300 mg by mouth 3 (three) times daily.     guaiFENesin  (MUCINEX ) 600 MG 12 hr tablet Take 1 tablet (600 mg total) by mouth 2 (two) times daily. 60 tablet 1   HYDROcodone -acetaminophen  (NORCO/VICODIN) 5-325 MG tablet Take 1-2 tablets by mouth every 6 (six) hours as needed.     levocetirizine (XYZAL ) 5 MG tablet TAKE 1 TABLET(5 MG) BY MOUTH EVERY EVENING 90 tablet 0   MAGNESIUM PO Take 500 mg by mouth daily.     meloxicam (MOBIC) 15 MG tablet Take 15 mg by mouth daily.     montelukast  (SINGULAIR ) 10 MG tablet TAKE 1 TABLET BY MOUTH AT BEDTIME 90 tablet 0   nicotine  (NICODERM CQ  - DOSED IN MG/24 HOURS) 21 mg/24hr patch Place 1 patch (21 mg total) onto the skin daily. 28 patch 0   nitroGLYCERIN  (NITROSTAT ) 0.4 MG SL tablet Place 1 tablet (0.4 mg total) under the tongue every 5 (five) minutes as needed for chest pain. 50 tablet 3   omeprazole  (PRILOSEC) 40 MG capsule TAKE 1 CAPSULE(40 MG) BY MOUTH DAILY 90 capsule 3   REPATHA  SURECLICK 140 MG/ML SOAJ ADMINISTER 1 ML(140MG ) UNDER THE SKIN EVERY 14 DAYS AS DIRECTED 2 mL 2    Semaglutide , 2 MG/DOSE, (OZEMPIC , 2 MG/DOSE,) 8 MG/3ML SOPN Inject 2 mg into the skin once a week. 9 mL 0   triamcinolone  (KENALOG ) 0.025 % ointment Apply 1 Application topically 2 (two) times daily. Apply to corners of mouth twice daily 30 g 0   No current facility-administered medications on file prior to visit.   Past Medical History:  Diagnosis Date   Allergic rhinitis    Anginal pain (HCC)    Arthritis    Blood transfusion without reported diagnosis    Cellulitis, face 06/08/2022  Complication of anesthesia    COPD (chronic obstructive pulmonary disease) (HCC)    Dental abscess 06/09/2022   GERD (gastroesophageal reflux disease)    History of colon polyps    History of kidney stones    HTN (hypertension)    Hyperlipidemia    Kidney stones    Pancreatitis    Pneumonia    PONV (postoperative nausea and vomiting)    Primary hypertension 10/20/2021   Psychotic depression (HCC)    Shingles February 26th, 2001   Tendinitis    both arms   Past Surgical History:  Procedure Laterality Date   ABDOMINAL HYSTERECTOMY     BACK SURGERY  02/2023   CARDIAC CATHETERIZATION  2008   CESAREAN SECTION     CHOLECYSTECTOMY     COLONOSCOPY  05/02/2017   Colonic polyp status post polypectomy. Small internal hemorrhoids   FRACTURE SURGERY Bilateral    [facility]   REPLACEMENT TOTAL KNEE Right 02/09/2019   TUBAL LIGATION      Family History  Problem Relation Age of Onset   Arthritis Mother    Lung disease Mother    Parkinson's disease Mother    COPD Father    Arthritis Father    Healthy Sister    Breast cancer Maternal Aunt        great aunt   Rheum arthritis Maternal Uncle    Rheum arthritis Maternal Grandfather    Healthy Son    High Cholesterol Son    Healthy Son    Colon cancer Cousin        mother's cousin   Diabetes Other    Stroke Other    Hypertension Other    Hyperlipidemia Other    Asthma Other    Heart failure Other    Thyroid  disease Other    Heart attack  Other    COPD Other    Arrhythmia Other    Arthritis Other    Migraines Other    Social History   Socioeconomic History   Marital status: Widowed    Spouse name: Not on file   Number of children: 2   Years of education: Not on file   Highest education level: GED or equivalent  Occupational History   Not on file  Tobacco Use   Smoking status: Every Day    Current packs/day: 1.00    Average packs/day: 1 pack/day for 20.0 years (20.0 ttl pk-yrs)    Types: Cigarettes    Passive exposure: Past   Smokeless tobacco: Never  Vaping Use   Vaping status: Never Used  Substance and Sexual Activity   Alcohol use: Yes    Comment: occas   Drug use: No   Sexual activity: Not Currently  Other Topics Concern   Not on file  Social History Narrative   Not on file   Social Drivers of Health   Financial Resource Strain: Medium Risk (08/05/2023)   Overall Financial Resource Strain (CARDIA)    Difficulty of Paying Living Expenses: Somewhat hard  Food Insecurity: Food Insecurity Present (08/05/2023)   Hunger Vital Sign    Worried About Running Out of Food in the Last Year: Often true    Ran Out of Food in the Last Year: Sometimes true  Transportation Needs: No Transportation Needs (08/05/2023)   PRAPARE - Administrator, Civil Service (Medical): No    Lack of Transportation (Non-Medical): No  Physical Activity: Inactive (08/05/2023)   Exercise Vital Sign    Days of Exercise per Week:  0 days    Minutes of Exercise per Session: 0 min  Stress: No Stress Concern Present (08/05/2023)   [employer] of Occupational Health - Occupational Stress Questionnaire    Feeling of Stress : Only a little  Social Connections: Moderately Integrated (08/05/2023)   Social Connection and Isolation Panel [NHANES]    Frequency of Communication with Friends and Family: More than three times a week    Frequency of Social Gatherings with Friends and Family: Twice a week    Attends Religious  Services: More than 4 times per year    Active Member of [employer] or Organizations: Yes    Attends Banker Meetings: 1 to 4 times per year    Marital Status: Widowed    Objective:  BP 112/64 (BP Location: Right Arm, Patient Position: Sitting, Cuff Size: Large)   Pulse 80   Temp 98.5 F (36.9 C) (Temporal)   Resp 16   Ht 5' 6 (1.676 m)   Wt 178 lb 12.8 oz (81.1 kg)   LMP  (LMP Unknown) Comment: hysterectomy 2008  SpO2 98%   BMI 28.86 kg/m      10/25/2023    9:14 AM 10/05/2023   10:01 AM 09/23/2023    8:44 AM  BP/Weight  Systolic BP 112 112 112  Diastolic BP 64 72 72  Wt. (Lbs) 178.8 176 174  BMI 28.86 kg/m2 28.41 kg/m2 28.08 kg/m2    Physical Exam Vitals reviewed.  Constitutional:      General: She is not in acute distress.    Appearance: Normal appearance. She is not ill-appearing.  Eyes:     Conjunctiva/sclera: Conjunctivae normal.  Neck:     Vascular: No carotid bruit.  Cardiovascular:     Rate and Rhythm: Normal rate and regular rhythm.     Heart sounds: Normal heart sounds.  Pulmonary:     Effort: Pulmonary effort is normal.     Breath sounds: Normal breath sounds.  Abdominal:     General: Bowel sounds are normal.     Palpations: Abdomen is soft.     Tenderness: There is abdominal tenderness. There is no guarding or rebound. Negative signs include psoas sign and obturator sign.    Neurological:     Mental Status: She is alert. Mental status is at baseline.  Psychiatric:        Mood and Affect: Mood normal.        Behavior: Behavior normal.     Lab Results  Component Value Date   WBC 5.3 09/23/2023   HGB 13.8 09/23/2023   HCT 42.3 09/23/2023   PLT 313 09/23/2023   GLUCOSE 91 09/23/2023   CHOL 187 08/05/2023   TRIG 151 (H) 08/05/2023   HDL 79 08/05/2023   LDLCALC 83 08/05/2023   ALT 36 (H) 09/23/2023   AST 19 09/23/2023   NA 140 09/23/2023   K 4.5 09/23/2023   CL 102 09/23/2023   CREATININE 0.52 09/23/2023   BUN 11 09/23/2023   CO2  28 09/23/2023   TSH 0.955 08/05/2023   INR 0.8 01/04/2023   HGBA1C 5.4 08/05/2023      Assessment & Plan:    Acute right flank pain Assessment & Plan: Chronic pain for approximately 3 months. No acute signs of appendicitis on physical examination. Elevated liver enzymes noted in recent labs. -Order abdominal ultrasound to evaluate liver and other abdominal structures. -Check urine microalbumin to assess kidney function.   Orders: -     POCT URINALYSIS DIP (  CLINITEK) -     Urine Culture -     US  ABDOMEN LIMITED RUQ (LIVER/GB); Future -     Microalbumin / creatinine urine ratio  Frequency of urination Assessment & Plan: POCT UTI - negative - culture sent to further evaluate cause of increased urinary frequency and right flank pain -Remember to wipe from front to back and don't hold it in! If possible, empty your bladder every 4 hours.   Orders: -     POCT URINALYSIS DIP (CLINITEK) -     Urine Culture  Elevated liver enzymes Assessment & Plan: Elevated liver enzymes noted in recent labs.  ALT - 36 -Order abdominal ultrasound to evaluate liver and other abdominal structures.  Orders: -     US  ABDOMEN LIMITED RUQ (LIVER/GB); Future  Prediabetes Assessment & Plan: Patient on Ozempic , but not experiencing weight loss. -Add Metformin  500mg  twice daily with meals to current regimen. -Continue Ozempic  2 mg injection SQ weekly  Orders: -     metFORMIN  HCl; Take 1 tablet (500 mg total) by mouth 2 (two) times daily with a meal.  Dispense: 180 tablet; Refill: 3     Meds ordered this encounter  Medications   metFORMIN  (GLUCOPHAGE ) 500 MG tablet    Sig: Take 1 tablet (500 mg total) by mouth 2 (two) times daily with a meal.    Dispense:  180 tablet    Refill:  3    Orders Placed This Encounter  Procedures   Urine Culture   US  Abdomen Limited RUQ (LIVER/GB)   Microalbumin/Creatinine Ratio, Urine   POCT URINALYSIS DIP (CLINITEK)     Follow-up: No follow-ups on  file.  An After Visit Summary was printed and given to the patient.  Total time spent on today's visit was 37 minutes, including both face-to-face time and nonface-to-face time personally spent on review of chart (labs and imaging), discussing labs and goals, discussing further work-up, treatment options, referrals to specialist if needed, reviewing outside records if pertinent, answering patient's questions, and coordinating care.    Harrie Cedar, FNP [facility] [phone]

## 2023-10-25 NOTE — Telephone Encounter (Signed)
PA submitted and denied via covermymeds for repatha.

## 2023-10-25 NOTE — Assessment & Plan Note (Signed)
 Chronic pain for approximately 3 months. No acute signs of appendicitis on physical examination. Elevated liver enzymes noted in recent labs. -Order abdominal ultrasound to evaluate liver and other abdominal structures. -Check urine microalbumin to assess kidney function.

## 2023-10-25 NOTE — Assessment & Plan Note (Addendum)
POCT UTI - negative - culture sent to further evaluate cause of increased urinary frequency and right flank pain -Remember to wipe from front to back and don't hold it in! If possible, empty your bladder every 4 hours.

## 2023-10-25 NOTE — Assessment & Plan Note (Addendum)
Elevated liver enzymes noted in recent labs.  ALT - 36 -Order abdominal ultrasound to evaluate liver and other abdominal structures.

## 2023-10-26 ENCOUNTER — Other Ambulatory Visit (HOSPITAL_BASED_OUTPATIENT_CLINIC_OR_DEPARTMENT_OTHER): Payer: Medicaid Other | Admitting: Radiology

## 2023-10-26 ENCOUNTER — Telehealth: Payer: Medicaid Other | Admitting: Physician Assistant

## 2023-10-26 ENCOUNTER — Ambulatory Visit (INDEPENDENT_AMBULATORY_CARE_PROVIDER_SITE_OTHER)
Admission: RE | Admit: 2023-10-26 | Discharge: 2023-10-26 | Disposition: A | Discharge status: Home / Self Care | Payer: Medicaid Other | Source: Ambulatory Visit | Attending: Family Medicine | Admitting: Family Medicine

## 2023-10-26 ENCOUNTER — Telehealth: Payer: Self-pay

## 2023-10-26 DIAGNOSIS — R748 Abnormal levels of other serum enzymes: Secondary | ICD-10-CM

## 2023-10-26 DIAGNOSIS — R109 Unspecified abdominal pain: Secondary | ICD-10-CM

## 2023-10-26 DIAGNOSIS — B9689 Other specified bacterial agents as the cause of diseases classified elsewhere: Secondary | ICD-10-CM | POA: Diagnosis not present

## 2023-10-26 DIAGNOSIS — J019 Acute sinusitis, unspecified: Secondary | ICD-10-CM

## 2023-10-26 LAB — MICROALBUMIN / CREATININE URINE RATIO
Creatinine, Urine: 23.6 mg/dL
Microalb/Creat Ratio: <13 mg/g{creat} (ref 0–29)
Microalbumin, Urine: <3 ug/mL

## 2023-10-26 LAB — URINE CULTURE

## 2023-10-26 MED ORDER — AMOXICILLIN-POT CLAVULANATE 875-125 MG PO TABS: 1.0000 | ORAL_TABLET | Freq: Two times a day (BID) | ORAL | 0 refills | Status: DC

## 2023-10-26 NOTE — Telephone Encounter (Signed)
 I am unsure as to what medical necessity I would document, aortic atherosclerosis?   Copied from CRM [id number]. Topic: Clinical - Prescription Issue >> Oct 26, 2023 10:52 AM Graeme ORN wrote: Reason for CRM: Harlene from [facility] called states patient PA for REPATHA  SURECLICK 140 MG/ML SOAJ has been denied. Insurance plan states patient needs appeal and include medical necessity as to why patient needs this medicine over any other and include chart notes and lab results. Can call to verbally initiate: [phone].

## 2023-10-26 NOTE — Progress Notes (Signed)

## 2023-10-26 NOTE — Progress Notes (Signed)
 I have spent 5 minutes in review of e-visit questionnaire, review and updating patient chart, medical decision making and response to patient.   Piedad Climes, PA-C

## 2023-10-27 DIAGNOSIS — M25532 Pain in left wrist: Secondary | ICD-10-CM | POA: Diagnosis not present

## 2023-10-27 DIAGNOSIS — M19022 Primary osteoarthritis, left elbow: Secondary | ICD-10-CM | POA: Diagnosis not present

## 2023-10-27 DIAGNOSIS — M19021 Primary osteoarthritis, right elbow: Secondary | ICD-10-CM | POA: Diagnosis not present

## 2023-10-31 ENCOUNTER — Telehealth: Payer: Self-pay

## 2023-10-31 ENCOUNTER — Ambulatory Visit: Payer: Medicaid Other | Attending: Physician Assistant | Admitting: Physician Assistant

## 2023-10-31 ENCOUNTER — Encounter: Payer: Self-pay | Admitting: Physician Assistant

## 2023-10-31 VITALS — BP 135/78 | HR 82 | Resp 14 | Ht 66.0 in | Wt 177.0 lb

## 2023-10-31 DIAGNOSIS — M0600 Rheumatoid arthritis without rheumatoid factor, unspecified site: Secondary | ICD-10-CM

## 2023-10-31 DIAGNOSIS — M51369 Other intervertebral disc degeneration, lumbar region without mention of lumbar back pain or lower extremity pain: Secondary | ICD-10-CM

## 2023-10-31 DIAGNOSIS — Z720 Tobacco use: Secondary | ICD-10-CM

## 2023-10-31 DIAGNOSIS — M19042 Primary osteoarthritis, left hand: Secondary | ICD-10-CM

## 2023-10-31 DIAGNOSIS — Z96651 Presence of right artificial knee joint: Secondary | ICD-10-CM

## 2023-10-31 DIAGNOSIS — Q6671 Congenital pes cavus, right foot: Secondary | ICD-10-CM

## 2023-10-31 DIAGNOSIS — M4126 Other idiopathic scoliosis, lumbar region: Secondary | ICD-10-CM

## 2023-10-31 DIAGNOSIS — F411 Generalized anxiety disorder: Secondary | ICD-10-CM

## 2023-10-31 DIAGNOSIS — Z8261 Family history of arthritis: Secondary | ICD-10-CM

## 2023-10-31 DIAGNOSIS — Z79899 Other long term (current) drug therapy: Secondary | ICD-10-CM

## 2023-10-31 DIAGNOSIS — I700 Atherosclerosis of aorta: Secondary | ICD-10-CM

## 2023-10-31 DIAGNOSIS — M7711 Lateral epicondylitis, right elbow: Secondary | ICD-10-CM | POA: Diagnosis not present

## 2023-10-31 DIAGNOSIS — Z8709 Personal history of other diseases of the respiratory system: Secondary | ICD-10-CM

## 2023-10-31 DIAGNOSIS — G43809 Other migraine, not intractable, without status migrainosus: Secondary | ICD-10-CM

## 2023-10-31 DIAGNOSIS — M7061 Trochanteric bursitis, right hip: Secondary | ICD-10-CM

## 2023-10-31 DIAGNOSIS — M79642 Pain in left hand: Secondary | ICD-10-CM

## 2023-10-31 DIAGNOSIS — M19071 Primary osteoarthritis, right ankle and foot: Secondary | ICD-10-CM

## 2023-10-31 DIAGNOSIS — M7120 Synovial cyst of popliteal space [Baker], unspecified knee: Secondary | ICD-10-CM

## 2023-10-31 DIAGNOSIS — Q6672 Congenital pes cavus, left foot: Secondary | ICD-10-CM

## 2023-10-31 DIAGNOSIS — Z840 Family history of diseases of the skin and subcutaneous tissue: Secondary | ICD-10-CM

## 2023-10-31 DIAGNOSIS — M19072 Primary osteoarthritis, left ankle and foot: Secondary | ICD-10-CM

## 2023-10-31 DIAGNOSIS — M19041 Primary osteoarthritis, right hand: Secondary | ICD-10-CM

## 2023-10-31 DIAGNOSIS — R7303 Prediabetes: Secondary | ICD-10-CM

## 2023-10-31 DIAGNOSIS — M7062 Trochanteric bursitis, left hip: Secondary | ICD-10-CM

## 2023-10-31 DIAGNOSIS — M7712 Lateral epicondylitis, left elbow: Secondary | ICD-10-CM

## 2023-10-31 DIAGNOSIS — I10 Essential (primary) hypertension: Secondary | ICD-10-CM

## 2023-10-31 DIAGNOSIS — M79641 Pain in right hand: Secondary | ICD-10-CM | POA: Diagnosis not present

## 2023-10-31 DIAGNOSIS — J301 Allergic rhinitis due to pollen: Secondary | ICD-10-CM

## 2023-10-31 DIAGNOSIS — K219 Gastro-esophageal reflux disease without esophagitis: Secondary | ICD-10-CM

## 2023-10-31 DIAGNOSIS — E782 Mixed hyperlipidemia: Secondary | ICD-10-CM

## 2023-10-31 MED ORDER — HYDROXYCHLOROQUINE SULFATE 200 MG PO TABS: ORAL_TABLET | ORAL | 0 refills | Status: DC

## 2023-10-31 NOTE — Telephone Encounter (Signed)
 PA resubmitted and aproved via covermymeds for repatha . In the future make sure to sure icd I70.0 for coverage.

## 2023-11-01 ENCOUNTER — Encounter: Payer: Self-pay | Admitting: Family Medicine

## 2023-11-04 ENCOUNTER — Other Ambulatory Visit: Payer: Self-pay | Admitting: Family Medicine

## 2023-11-04 DIAGNOSIS — J441 Chronic obstructive pulmonary disease with (acute) exacerbation: Secondary | ICD-10-CM

## 2023-11-09 ENCOUNTER — Ambulatory Visit: Payer: Medicaid Other | Admitting: Rheumatology

## 2023-11-11 ENCOUNTER — Other Ambulatory Visit: Payer: Self-pay | Admitting: Family Medicine

## 2023-11-11 DIAGNOSIS — J441 Chronic obstructive pulmonary disease with (acute) exacerbation: Secondary | ICD-10-CM

## 2023-11-14 ENCOUNTER — Encounter: Payer: Self-pay | Admitting: Podiatry

## 2023-11-14 ENCOUNTER — Ambulatory Visit (INDEPENDENT_AMBULATORY_CARE_PROVIDER_SITE_OTHER): Payer: Medicaid Other

## 2023-11-14 ENCOUNTER — Ambulatory Visit: Payer: Medicaid Other | Admitting: Podiatry

## 2023-11-14 DIAGNOSIS — M778 Other enthesopathies, not elsewhere classified: Secondary | ICD-10-CM

## 2023-11-14 DIAGNOSIS — M205X1 Other deformities of toe(s) (acquired), right foot: Secondary | ICD-10-CM

## 2023-11-14 DIAGNOSIS — M25872 Other specified joint disorders, left ankle and foot: Secondary | ICD-10-CM

## 2023-11-14 DIAGNOSIS — M19071 Primary osteoarthritis, right ankle and foot: Secondary | ICD-10-CM

## 2023-11-14 MED ORDER — TRIAMCINOLONE ACETONIDE 10 MG/ML IJ SUSP
5.0000 mg | Freq: Once | INTRAMUSCULAR | Status: AC
  Administered 2023-11-14: 5 mg

## 2023-11-14 NOTE — Progress Notes (Unsigned)
 Subjective:  Patient ID: [name], female    DOB: 1966-06-22,  MRN: [id number]  Chief Complaint  Patient presents with   Foot Pain    Pain is in the space between 1st and 2nd toes. Dr. Carlota Raspberry gave her injection last time. Xrays up. Been hurting for years. Goes into the top of her foot. Last A1c in Nov, 5.5. take ASA 79    58 y.o. female presents with concern for pain in the right great toe joint previously had 3 steroid injections over the course of last year.  She has gotten periodic relief from this. Not interested in surgical intervention for this problem at this time   Past Medical History:  Diagnosis Date   Allergic rhinitis    Anginal pain (HCC)    Arthritis    Blood transfusion without reported diagnosis    Cellulitis, face 06/08/2022   Complication of anesthesia    COPD (chronic obstructive pulmonary disease) (HCC)    Dental abscess 06/09/2022   GERD (gastroesophageal reflux disease)    History of colon polyps    History of kidney stones    HTN (hypertension)    Hyperlipidemia    Kidney stones    Pancreatitis    Pneumonia    PONV (postoperative nausea and vomiting)    Primary hypertension 10/20/2021   Psychotic depression (HCC)    Shingles February 26th, 2001   Tendinitis    both arms    Allergies  Allergen Reactions   Levofloxacin Other (See Comments) and Rash    Rash all over redness   Bactrim Ds [Sulfamethoxazole-Trimethoprim] Nausea Only   Lipitor [Atorvastatin] Other (See Comments)    Myalgia    Tape Hives   Zetia [Ezetimibe]     Muscle pain   Codeine Nausea Only   Other Rash    bandaids leave a red area if left on too long    ROS: Negative except as per HPI above  Objective:  General: AAO x3, NAD  Dermatological: With inspection and palpation of the right and left lower extremities there are no open sores, no preulcerative lesions, no rash or signs of infection present. Nails are of normal length thickness and coloration.   Vascular:   Dorsalis Pedis artery and Posterior Tibial artery pedal pulses are 2/4 bilateral.  Capillary fill time < 3 sec to all digits.   Neruologic: Grossly intact via light touch bilateral. Protective threshold intact to all sites bilateral.   Musculoskeletal: Decreased range of motion and pain of the right first MPJ which is made worse with dorsiflexion range of motion.  Worsened with forefoot loading.  Gait: Unassisted, Nonantalgic.   No images are attached to the encounter.  Radiographs:  Date: 11/14/2023 XR right foot weightbearing AP/Lateral/Oblique   Findings: Elevated first metatarsal with dorsal spurring present.  Mild bunion deformity.  Some narrowing of the first MPJ joint space noted. Assessment:   1. Hallux limitus of right foot       Plan:  Patient was evaluated and treated and all questions answered.  # Hallux limitus of right foot We discussed the etiology and treatment options for hallux limitus / rigidus including surgical and non surgical treatment options. We discussed shoe gear changes, orthotics, injection therapy. We also discussed cheilectomy, 1st MTP arthrodesis, and arthroplasty with implants and the rationale for all of these. We discussed that each has advantages/disadvantages, and risks/benefits.  -Recommend injection at this time -Proceeded with injection of 0.5 cc half percent Marcaine plain with 0.5 cc  Kenalog 10 into the lateral aspect of the first metatarsophalangeal joint after sterile alcohol prep and applied sterile adhesive bandage afterwards.  Patient tolerated well -Did discuss that if injections are needed more frequently, may need to give consideration to surgery -Did discuss orthotic with a Morton's extension as well.  Patient is unsure about out-of-pocket cost with Medicaid, she may look for over-the-counter options.     Return in about 3 months (around 02/11/2024), or if symptoms worsen or fail to improve, for 1st MPJ arthritis.          Avonelle Viveros L.  Jamse Arn, DPM [facility] / [facility]

## 2023-11-18 ENCOUNTER — Other Ambulatory Visit: Payer: Self-pay | Admitting: Family Medicine

## 2023-11-18 DIAGNOSIS — M545 Low back pain, unspecified: Secondary | ICD-10-CM

## 2023-11-19 ENCOUNTER — Other Ambulatory Visit: Payer: Self-pay | Admitting: Family Medicine

## 2023-11-19 DIAGNOSIS — F411 Generalized anxiety disorder: Secondary | ICD-10-CM

## 2023-11-20 ENCOUNTER — Other Ambulatory Visit: Payer: Self-pay | Admitting: Family Medicine

## 2023-11-20 DIAGNOSIS — F17219 Nicotine dependence, cigarettes, with unspecified nicotine-induced disorders: Secondary | ICD-10-CM

## 2023-11-23 NOTE — Progress Notes (Unsigned)
 Subjective:  Patient ID: [name], female    DOB: May 29, 1966  Age: 58 y.o. MRN: [id number]  Chief Complaint  Patient presents with   Medical Management of Chronic Issues   Discussed the use of AI scribe software for clinical note transcription with the patient, who gave verbal consent to proceed.  History of Present Illness   The patient, with a history of rheumatoid arthritis, prediabetes, and smoking, presents with multiple health concerns. The patient was diagnosed with rheumatoid arthritis in November and has been on Plaquenil since then. The patient initially stopped taking Plaquenil due to concerns about weight gain but resumed after experiencing worsening symptoms in her hands and feet.  The patient is also on Ozempic for prediabetes but has not seen significant weight loss. The patient admits to not eating healthily and not exercising due to pain. The patient has also been trying to quit smoking and has reduced her smoking from two packs a day to half a pack a day with the help of nicotine patches.  The patient also reports joint pain, back pain, and a throbbing pain in her artificial knee. The patient has had the artificial knee for five years and the pain started recently. The patient has contacted her orthopedic doctor about this issue.  The patient also reports a constant pain in her right side, which has been present for years. The patient has had an ultrasound of her liver and gallbladder but does not know the cause of the pain.       Hyperlipidemia/Aortic atherosclerosis:  Intolerant to lipitor and zetia. Taking Repatha 140 mg every 14 days.  Hypertension: On amlodipine 10 mg daily, furosemide 20 mg as needed (rarely takes).  GERD: Taking omeprazole 40 mg daily, Pepcid 40 mg daily. Heartburn well controlled.  Prediabetes; a1c 5.4. Eating healthy.  On ozempic 2 mg weekly.   Chronic back pain: on gabapentin 300 mg before bed as needed. Cyclobenzaprine 10 mg three  times a day.  Asthma: Albuterol hfa 2 puffs four times a day as needed shortness of breath. On Advair 2 puffs twice one daily. On singulair.   Tobacco disorder:  Nicoderm patches 21 mcg daily. Smoking 1 ppd.   Insomnia: taking magnesium which is helping. Mag 500 mg twice daily.   Depression: on wellbutrin xl 300 mg daily.      08/05/2023    7:44 AM 04/12/2023    7:47 AM 06/24/2022    2:28 PM  Depression screen PHQ 2/9  Decreased Interest 1 1 0  Down, Depressed, Hopeless 1 1 0  PHQ - 2 Score 2 2 0  Altered sleeping 2 2   Tired, decreased energy 1 2   Change in appetite 1 0   Feeling bad or failure about yourself  1 1   Trouble concentrating 1 0   Moving slowly or fidgety/restless 0 0   Suicidal thoughts 0 0   PHQ-9 Score 8 7   Difficult doing work/chores Not difficult at all Not difficult at all         08/05/2023    7:44 AM  Fall Risk   Falls in the past year? 0  Number falls in past yr: 0  Follow up Falls evaluation completed    Patient Care Team: Blane Ohara, MD as PCP - General (Family Medicine) Mariea Stable, MD as Referring Physician (Internal Medicine)   Review of Systems  Constitutional:  Negative for appetite change, fatigue and fever.  HENT:  Negative for congestion, ear  pain, sinus pressure and sore throat.   Eyes: Negative.   Respiratory:  Positive for cough. Negative for chest tightness, shortness of breath and wheezing.   Cardiovascular:  Negative for chest pain and palpitations.  Gastrointestinal:  Negative for abdominal pain, constipation, diarrhea, nausea and vomiting.  Endocrine: Negative.   Genitourinary:  Negative for dysuria and hematuria.  Musculoskeletal:  Positive for arthralgias and back pain. Negative for joint swelling and myalgias.  Skin:  Negative for rash.  Allergic/Immunologic: Negative.   Neurological:  Negative for dizziness, weakness and headaches.  Hematological: Negative.   Psychiatric/Behavioral:  Negative for dysphoric  mood. The patient is not nervous/anxious.     Current Outpatient Medications on File Prior to Visit  Medication Sig Dispense Refill   acetaminophen (TYLENOL) 650 MG CR tablet Take 650-1,300 mg by mouth every 8 (eight) hours as needed for pain.     ADVAIR HFA 115-21 MCG/ACT inhaler INHALE 2 PUFFS INTO THE LUNGS TWICE DAILY 36 g 0   albuterol (VENTOLIN HFA) 108 (90 Base) MCG/ACT inhaler INHALE 2 PUFFS INTO LUNGS EVERY 6 TO 8 HOURS AS NEEDED FOR WHEEZING 18 g 1   ALPRAZolam (XANAX) 0.25 MG tablet TAKE 1 TABLET(0.25 MG) BY MOUTH DAILY AS NEEDED FOR ANXIETY 30 tablet 1   amLODipine (NORVASC) 10 MG tablet TAKE 1 TABLET BY MOUTH DAILY 90 tablet 0   aspirin EC 81 MG tablet Take 1 tablet (81 mg total) by mouth daily. Swallow whole.     azelastine (ASTELIN) 0.1 % nasal spray Place 2 sprays into both nostrils 2 (two) times daily. Use in each nostril as directed 30 mL 12   buPROPion (WELLBUTRIN XL) 300 MG 24 hr tablet TAKE 1 TABLET(300 MG) BY MOUTH DAILY. 90 tablet 1   Cholecalciferol (VITAMIN D-3 PO) Take by mouth.     dicyclomine (BENTYL) 10 MG capsule TAKE 1 CAPSULE(10 MG) BY MOUTH FOUR TIMES DAILY BEFORE MEALS AND AT BEDTIME 120 capsule 2   Docusate Sodium (DSS) 100 MG CAPS Take 100 mg by mouth daily as needed (constipation).     famotidine (PEPCID) 40 MG tablet Take 1 tablet (40 mg total) by mouth daily as needed for heartburn or indigestion. 90 tablet 1   fluticasone (FLONASE) 50 MCG/ACT nasal spray SHAKE LIQUID AND USE 2 SPRAYS IN EACH NOSTRIL DAILY 16 g 6   furosemide (LASIX) 20 MG tablet Take 1 tablet (20 mg total) by mouth daily as needed for edema. 1 tablet 0   gabapentin (NEURONTIN) 300 MG capsule Take 300 mg by mouth 3 (three) times daily.     guaiFENesin (MUCINEX) 600 MG 12 hr tablet Take 1 tablet (600 mg total) by mouth 2 (two) times daily. 60 tablet 1   HYDROcodone-acetaminophen (NORCO/VICODIN) 5-325 MG tablet Take 1-2 tablets by mouth every 6 (six) hours as needed.     hydroxychloroquine  (PLAQUENIL) 200 MG tablet TAKE 1 TABLET BY MOUTH TWICE DAILY ON MONDAYS THROUGH FRIDAYS ONLY, DO NOT TAKE ON SATURDAY AND SUNDAY 120 tablet 0   levocetirizine (XYZAL) 5 MG tablet TAKE 1 TABLET(5 MG) BY MOUTH EVERY EVENING 90 tablet 0   MAGNESIUM PO Take 500 mg by mouth daily.     meloxicam (MOBIC) 15 MG tablet Take 15 mg by mouth daily.     metFORMIN (GLUCOPHAGE) 500 MG tablet Take 1 tablet (500 mg total) by mouth 2 (two) times daily with a meal. 180 tablet 3   Multiple Vitamins-Minerals (MULTIVITAMIN WITH MINERALS) tablet Take 1 tablet by mouth daily.  nitroGLYCERIN (NITROSTAT) 0.4 MG SL tablet Place 1 tablet (0.4 mg total) under the tongue every 5 (five) minutes as needed for chest pain. 50 tablet 3   omeprazole (PRILOSEC) 40 MG capsule TAKE 1 CAPSULE(40 MG) BY MOUTH DAILY 90 capsule 3   REPATHA SURECLICK 140 MG/ML SOAJ ADMINISTER 1 ML(140MG ) UNDER THE SKIN EVERY 14 DAYS AS DIRECTED 2 mL 2   triamcinolone (KENALOG) 0.025 % ointment Apply 1 Application topically 2 (two) times daily. Apply to corners of mouth twice daily 30 g 0   VITAMIN E PO Take by mouth.     No current facility-administered medications on file prior to visit.   Past Medical History:  Diagnosis Date   Allergic rhinitis    Anginal pain (HCC)    Arthritis    Blood transfusion without reported diagnosis    Cellulitis, face 06/08/2022   Complication of anesthesia    COPD (chronic obstructive pulmonary disease) (HCC)    Dental abscess 06/09/2022   GERD (gastroesophageal reflux disease)    History of colon polyps    History of kidney stones    HTN (hypertension)    Hyperlipidemia    Kidney stones    Pancreatitis    Pneumonia    PONV (postoperative nausea and vomiting)    Primary hypertension 10/20/2021   Psychotic depression (HCC)    Rheumatoid arthritis (HCC) 2024   Shingles February 26th, 2001   Tendinitis    both arms   Past Surgical History:  Procedure Laterality Date   ABDOMINAL HYSTERECTOMY     BACK  SURGERY  02/2023   CARDIAC CATHETERIZATION  2008   CESAREAN SECTION     CHOLECYSTECTOMY     COLONOSCOPY  05/02/2017   Colonic polyp status post polypectomy. Small internal hemorrhoids   FRACTURE SURGERY Bilateral    [facility]   REPLACEMENT TOTAL KNEE Right 02/09/2019   TUBAL LIGATION      Family History  Problem Relation Age of Onset   Arthritis Mother    Lung disease Mother    Parkinson's disease Mother    COPD Father    Arthritis Father    Healthy Sister    Breast cancer Maternal Aunt        great aunt   Rheum arthritis Maternal Uncle    Rheum arthritis Maternal Grandfather    Healthy Son    High Cholesterol Son    Healthy Son    Colon cancer Cousin        mother's cousin   Diabetes Other    Stroke Other    Hypertension Other    Hyperlipidemia Other    Asthma Other    Heart failure Other    Thyroid disease Other    Heart attack Other    COPD Other    Arrhythmia Other    Arthritis Other    Migraines Other    Social History   Socioeconomic History   Marital status: Widowed    Spouse name: Not on file   Number of children: 2   Years of education: Not on file   Highest education level: GED or equivalent  Occupational History   Not on file  Tobacco Use   Smoking status: Every Day    Current packs/day: 1.00    Average packs/day: 1 pack/day for 20.0 years (20.0 ttl pk-yrs)    Types: Cigarettes    Passive exposure: Past   Smokeless tobacco: Never  Vaping Use   Vaping status: Never Used  Substance and Sexual Activity  Alcohol use: Yes    Comment: rarely   Drug use: No   Sexual activity: Not Currently  Other Topics Concern   Not on file  Social History Narrative   Not on file   Social Drivers of Health   Financial Resource Strain: Medium Risk (08/05/2023)   Overall Financial Resource Strain (CARDIA)    Difficulty of Paying Living Expenses: Somewhat hard  Food Insecurity: Food Insecurity Present (08/05/2023)   Hunger Vital Sign    Worried About Running  Out of Food in the Last Year: Often true    Ran Out of Food in the Last Year: Sometimes true  Transportation Needs: No Transportation Needs (08/05/2023)   PRAPARE - Administrator, Civil Service (Medical): No    Lack of Transportation (Non-Medical): No  Physical Activity: Inactive (08/05/2023)   Exercise Vital Sign    Days of Exercise per Week: 0 days    Minutes of Exercise per Session: 0 min  Stress: No Stress Concern Present (08/05/2023)   [employer] of Occupational Health - Occupational Stress Questionnaire    Feeling of Stress : Only a little  Social Connections: Moderately Integrated (08/05/2023)   Social Connection and Isolation Panel [NHANES]    Frequency of Communication with Friends and Family: More than three times a week    Frequency of Social Gatherings with Friends and Family: Twice a week    Attends Religious Services: More than 4 times per year    Active Member of [employer] or Organizations: Yes    Attends Banker Meetings: 1 to 4 times per year    Marital Status: Widowed    Objective:  BP 102/60   Pulse 83   Temp 98.4 F (36.9 C) (Temporal)   Resp 16   Ht 5\' 6"  (1.676 m)   Wt 175 lb 3.2 oz (79.5 kg)   LMP  (LMP Unknown) Comment: hysterectomy 2008  SpO2 98%   BMI 28.28 kg/m      11/24/2023    7:30 AM 10/31/2023    1:43 PM 10/25/2023    9:14 AM  BP/Weight  Systolic BP 102 135 112  Diastolic BP 60 78 64  Wt. (Lbs) 175.2 177 178.8  BMI 28.28 kg/m2 28.57 kg/m2 28.86 kg/m2    Physical Exam Vitals reviewed.  Constitutional:      Appearance: Normal appearance.  Neck:     Vascular: No carotid bruit.  Cardiovascular:     Rate and Rhythm: Normal rate and regular rhythm.     Heart sounds: Normal heart sounds.  Pulmonary:     Effort: Pulmonary effort is normal. No respiratory distress.     Breath sounds: Normal breath sounds.  Abdominal:     General: Abdomen is flat. Bowel sounds are normal.     Palpations: Abdomen is soft.      Tenderness: There is no abdominal tenderness.  Neurological:     Mental Status: She is alert and oriented to person, place, and time.  Psychiatric:        Mood and Affect: Mood normal.        Behavior: Behavior normal.     Diabetic Foot Exam - Simple   No data filed      Lab Results  Component Value Date   WBC 5.8 11/24/2023   HGB 14.4 11/24/2023   HCT 43.6 11/24/2023   PLT 356 11/24/2023   GLUCOSE 87 11/24/2023   CHOL 152 11/24/2023   TRIG 129 11/24/2023   HDL 56  11/24/2023   LDLCALC 73 11/24/2023   ALT 70 (H) 11/24/2023   AST 36 11/24/2023   NA 138 11/24/2023   K 4.8 11/24/2023   CL 101 11/24/2023   CREATININE 0.51 (L) 11/24/2023   BUN 15 11/24/2023   CO2 21 11/24/2023   TSH 0.955 08/05/2023   INR 0.8 01/04/2023   HGBA1C 5.7 (H) 11/24/2023      Assessment & Plan:   Primary hypertension Assessment & Plan: Well controlled.  No changes to medicines. Amlodipine 10 mg daily, furosemide 20 mg as needed Labs drawn today.    Orders: -     CBC with Differential/Platelet -     Comprehensive metabolic panel  Simple chronic bronchitis (HCC) Assessment & Plan: Managed with Advair and albuterol. Airsupra discontinued due to taste and insurance issues. - Continue Advair and albuterol as needed.   Gastroesophageal reflux disease without esophagitis Assessment & Plan: Continue omeprazole 40 mg daily.  Continue pepcid 40 mg daily as needed.   Cigarette nicotine dependence with nicotine-induced disorder Assessment & Plan: Patient has reduced smoking to one pack per day and is using 21mg  nicotine patches. - Continue current nicotine patch regimen. - Encourage further reduction in smoking because the following recurring symptoms of sore throat, allergies, and respiratory infections will continue if she doesn't quit.  Orders: -     Nicotine; Place 1 patch (21 mg total) onto the skin daily.  Dispense: 28 patch; Refill: 0  Mixed hyperlipidemia Assessment &  Plan: Well controlled.  No changes to medicines.  Intolerant to lipitor and zetia. Taking Repatha 140 mg every 14 days. Continue to work on eating a healthy diet and exercise.  Referring to medial nutrition therapy Labs drawn today.    Orders: -     Lipid panel -     Amb ref to Medical Nutrition Therapy-MNT  Prediabetes Assessment & Plan: Patient on Ozempic, but not experiencing weight loss. -Continue Ozempic 2 mg injection SQ weekly - Labs drawn today - Referring to medical nutrition therapy  Orders: -     Ozempic (2 MG/DOSE); Inject 2 mg into the skin once a week.  Dispense: 6 mL; Refill: 0 -     Hemoglobin A1c -     Amb ref to Medical Nutrition Therapy-MNT  Lumbar back pain Assessment & Plan: Continue flexeril to 10 mg three times a day and gabapentin once daily before bed.   Orders: -     Cyclobenzaprine HCl; Take 1 tablet (10 mg total) by mouth 3 (three) times daily.  Dispense: 102 each; Refill: 0  Chronic allergic rhinitis Assessment & Plan: The current medical regimen is effective;  continue present plan and medications.  Sent singulair.  Orders: -     Montelukast Sodium; Take 1 tablet (10 mg total) by mouth at bedtime.  Dispense: 90 tablet; Refill: 0  Rheumatoid arthritis involving both hands with negative rheumatoid factor (HCC) Assessment & Plan: Symptoms managed with Plaquenil. Knee pain concerning post-replacement. Ultrasound confirmed hand inflammation. - Continue Plaquenil. - Follow up with orthopedic surgeon for knee pain evaluation.           Meds ordered this encounter  Medications   cyclobenzaprine (FLEXERIL) 10 MG tablet    Sig: Take 1 tablet (10 mg total) by mouth 3 (three) times daily.    Dispense:  102 each    Refill:  0   nicotine (NICODERM CQ - DOSED IN MG/24 HOURS) 21 mg/24hr patch    Sig: Place 1 patch (21 mg  total) onto the skin daily.    Dispense:  28 patch    Refill:  0   montelukast (SINGULAIR) 10 MG tablet    Sig: Take 1  tablet (10 mg total) by mouth at bedtime.    Dispense:  90 tablet    Refill:  0   Semaglutide, 2 MG/DOSE, (OZEMPIC, 2 MG/DOSE,) 8 MG/3ML SOPN    Sig: Inject 2 mg into the skin once a week.    Dispense:  6 mL    Refill:  0    Orders Placed This Encounter  Procedures   CBC with Differential/Platelet   Comprehensive metabolic panel   Hemoglobin A1c   Lipid panel   Amb ref to Medical Nutrition Therapy-MNT     Follow-up: Return for chronic follow up.   I,Marla I Leal-Borjas,acting as a scribe for Blane Ohara, MD.,have documented all relevant documentation on the behalf of Blane Ohara, MD,as directed by  Blane Ohara, MD while in the presence of Blane Ohara, MD.   An After Visit Summary was printed and given to the patient.  I attest that I have reviewed this visit and agree with the plan scribed by my staff.   Blane Ohara, MD [facility] [phone]

## 2023-11-24 ENCOUNTER — Ambulatory Visit: Payer: Medicaid Other | Admitting: Family Medicine

## 2023-11-24 ENCOUNTER — Encounter: Payer: Self-pay | Admitting: Family Medicine

## 2023-11-24 VITALS — BP 102/60 | HR 83 | Temp 98.4°F | Resp 16 | Ht 66.0 in | Wt 175.2 lb

## 2023-11-24 DIAGNOSIS — R7303 Prediabetes: Secondary | ICD-10-CM | POA: Diagnosis not present

## 2023-11-24 DIAGNOSIS — F17219 Nicotine dependence, cigarettes, with unspecified nicotine-induced disorders: Secondary | ICD-10-CM

## 2023-11-24 DIAGNOSIS — M06041 Rheumatoid arthritis without rheumatoid factor, right hand: Secondary | ICD-10-CM

## 2023-11-24 DIAGNOSIS — J410 Simple chronic bronchitis: Secondary | ICD-10-CM

## 2023-11-24 DIAGNOSIS — J309 Allergic rhinitis, unspecified: Secondary | ICD-10-CM

## 2023-11-24 DIAGNOSIS — M06042 Rheumatoid arthritis without rheumatoid factor, left hand: Secondary | ICD-10-CM

## 2023-11-24 DIAGNOSIS — K219 Gastro-esophageal reflux disease without esophagitis: Secondary | ICD-10-CM | POA: Diagnosis not present

## 2023-11-24 DIAGNOSIS — M545 Low back pain, unspecified: Secondary | ICD-10-CM

## 2023-11-24 DIAGNOSIS — I10 Essential (primary) hypertension: Secondary | ICD-10-CM

## 2023-11-24 DIAGNOSIS — E782 Mixed hyperlipidemia: Secondary | ICD-10-CM

## 2023-11-24 DIAGNOSIS — Z720 Tobacco use: Secondary | ICD-10-CM

## 2023-11-24 MED ORDER — NICOTINE 21 MG/24HR TD PT24: 21.0000 mg | MEDICATED_PATCH | Freq: Every day | TRANSDERMAL | 0 refills | Status: DC

## 2023-11-24 MED ORDER — CYCLOBENZAPRINE HCL 10 MG PO TABS: 10.0000 mg | ORAL_TABLET | Freq: Three times a day (TID) | ORAL | 0 refills | Status: DC

## 2023-11-24 MED ORDER — MONTELUKAST SODIUM 10 MG PO TABS: 10.0000 mg | ORAL_TABLET | Freq: Every day | ORAL | 0 refills | Status: DC

## 2023-11-24 MED ORDER — OZEMPIC (2 MG/DOSE) 8 MG/3ML ~~LOC~~ SOPN: 2.0000 mg | PEN_INJECTOR | SUBCUTANEOUS | 0 refills | Status: DC

## 2023-11-24 NOTE — Progress Notes (Signed)
 Office Visit Note  Patient: [name]             Date of Birth: [birth year]           MRN: [id number]             PCP: Blane Ohara, MD Referring: Blane Ohara, MD Visit Date: 12/08/2023 Occupation: @GUAROCC @  Subjective:  Discontinued plaquenil use   History of Present Illness: [name] is a 58 y.o. female with history of seronegative rheumatoid arthritis and osteoarthritis.  Patient discontinued Plaquenil on 11/28/2023.  Patient was concerned that Plaquenil was increasing her liver enzymes.  Patient states she was also having some blurry vision so she discontinued Plaquenil use.  Patient states she is scheduled for her next visit with ophthalmology in June 2025. Patient has been taking meloxicam 15 mg 1 tablet daily.  Patient states she also started taking ginger and turmeric on 12/06/2023.  Patient states that prior to taking meloxicam her pain was a 10 out of 10 and after taking her dose of meloxicam her pain reduces to a 3 out of 10.  Patient continues to have intermittent discomfort in both elbows due to lateral epicondylitis as well as pain and stiffness in both hands and both ankles.  Activities of Daily Living:  Patient reports morning stiffness for 1 hour.   Patient Reports nocturnal pain.  Difficulty dressing/grooming: Denies Difficulty climbing stairs: Reports Difficulty getting out of chair: Reports Difficulty using hands for taps, buttons, cutlery, and/or writing: Denies  Review of Systems  Constitutional:  Positive for fatigue.  HENT:  Positive for mouth dryness and nose dryness. Negative for mouth sores.   Eyes:  Negative for pain, visual disturbance and dryness.  Respiratory:  Negative for shortness of breath and difficulty breathing.   Cardiovascular:  Negative for chest pain and palpitations.  Gastrointestinal:  Positive for constipation. Negative for blood in stool and diarrhea.  Endocrine: Negative for increased urination.  Genitourinary:  Negative  for involuntary urination.  Musculoskeletal:  Positive for joint pain, gait problem, joint pain, myalgias, muscle weakness, morning stiffness, muscle tenderness and myalgias. Negative for joint swelling.  Skin:  Negative for color change, rash, hair loss and sensitivity to sunlight.  Allergic/Immunologic: Positive for susceptible to infections.  Neurological:  Positive for headaches. Negative for dizziness.  Hematological:  Negative for swollen glands.  Psychiatric/Behavioral:  Positive for depressed mood and sleep disturbance. The patient is nervous/anxious.     PMFS History:  Patient Active Problem List   Diagnosis Date Noted   Rheumatoid arthritis (HCC) 11/27/2023   Chronic allergic rhinitis 11/26/2023   Elevated liver enzymes 10/25/2023   Acute right flank pain 10/25/2023   Frequency of urination 10/25/2023   Wheezing 10/05/2023   Non-recurrent acute suppurative otitis media of right ear without spontaneous rupture of tympanic membrane 10/05/2023   Opacities of both lungs present on chest x-ray 10/05/2023   Tinnitus 09/08/2023   Non-seasonal allergic rhinitis due to pollen 09/08/2023   Seasonal allergic rhinitis due to pollen 06/16/2023   Greater trochanteric bursitis of left hip 04/12/2023   Degenerative scoliosis in adult patient 02/28/2023   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   Dental caries 01/04/2023   History of blood transfusion 01/04/2023   Failure of dental implant due to infection 01/04/2023   Lumbar back pain 09/14/2022   Tobacco abuse 06/09/2022   Aortic atherosclerosis (HCC) 05/08/2022   Chronic midline low back pain without sciatica 01/22/2022   Myalgia due to statin 01/22/2022  GERD (gastroesophageal reflux disease) 01/22/2022   Overweight with body mass index (BMI) of 28 to 28.9 in adult 12/31/2021   Prediabetes 10/20/2021   Migraine 10/20/2021   Primary hypertension 10/20/2021   DDD (degenerative disc disease), lumbar 06/17/2020    Neuropathy 04/08/2020   Simple chronic bronchitis (HCC) 01/06/2020   Other fatigue 01/01/2020   Spontaneous bruising 01/01/2020   Cigarette nicotine dependence with nicotine-induced disorder 01/01/2020   Mixed hyperlipidemia 01/01/2020   GAD (generalized anxiety disorder) 01/01/2020   Smoking 06/05/2019   Prinzmetal angina (HCC) 06/05/2019   Primary osteoarthritis of one knee, right 02/09/2019   Pain of left hip joint 12/19/2018   Osteoarthritis of carpometacarpal [facility]) joint of thumb 11/20/2018    Past Medical History:  Diagnosis Date   Allergic rhinitis    Anginal pain (HCC)    Arthritis    Blood transfusion without reported diagnosis    Cellulitis, face 06/08/2022   Complication of anesthesia    COPD (chronic obstructive pulmonary disease) (HCC)    Dental abscess 06/09/2022   GERD (gastroesophageal reflux disease)    History of colon polyps    History of kidney stones    HTN (hypertension)    Hyperlipidemia    Kidney stones    Pancreatitis    Pneumonia    PONV (postoperative nausea and vomiting)    Primary hypertension 10/20/2021   Psychotic depression (HCC)    Rheumatoid arthritis (HCC) 2024   Shingles February 26th, 2001   Tendinitis    both arms    Family History  Problem Relation Age of Onset   Arthritis Mother    Lung disease Mother    Parkinson's disease Mother    COPD Father    Arthritis Father    Healthy Sister    Breast cancer Maternal Aunt        great aunt   Rheum arthritis Maternal Uncle    Rheum arthritis Maternal Grandfather    Healthy Son    High Cholesterol Son    Healthy Son    Colon cancer Cousin        mother's cousin   Diabetes Other    Stroke Other    Hypertension Other    Hyperlipidemia Other    Asthma Other    Heart failure Other    Thyroid disease Other    Heart attack Other    COPD Other    Arrhythmia Other    Arthritis Other    Migraines Other    Past Surgical History:  Procedure Laterality Date   ABDOMINAL  HYSTERECTOMY     BACK SURGERY  02/2023   CARDIAC CATHETERIZATION  2008   CESAREAN SECTION     CHOLECYSTECTOMY     COLONOSCOPY  05/02/2017   Colonic polyp status post polypectomy. Small internal hemorrhoids   FRACTURE SURGERY Bilateral    femur   REPLACEMENT TOTAL KNEE Right 02/09/2019   TUBAL LIGATION     Social History   Social History Narrative   Not on file   Immunization History  Administered Date(s) Administered   Influenza,inj,Quad PF,6+ Mos 06/27/2018   Influenza-Unspecified 07/08/2017, 08/09/2022   PNEUMOCOCCAL CONJUGATE-20 05/06/2022   Tdap 12/01/2021   Zoster Recombinant(Shingrix) 09/14/2017, 05/05/2018     Objective: Vital Signs: BP 131/83 (BP Location: Left Arm, Patient Position: Sitting, Cuff Size: Normal)   Pulse 80   Resp 16   Ht 5\' 6"  (1.676 m)   Wt 177 lb (80.3 kg)   LMP  (LMP Unknown) Comment: hysterectomy 2008  BMI 28.57 kg/m    Physical Exam Vitals and nursing note reviewed.  Constitutional:      Appearance: She is well-developed.  HENT:     Head: Normocephalic and atraumatic.  Eyes:     Conjunctiva/sclera: Conjunctivae normal.  Cardiovascular:     Rate and Rhythm: Normal rate and regular rhythm.     Heart sounds: Normal heart sounds.  Pulmonary:     Effort: Pulmonary effort is normal.     Breath sounds: Normal breath sounds.  Abdominal:     General: Bowel sounds are normal.     Palpations: Abdomen is soft.  Musculoskeletal:     Cervical back: Normal range of motion.  Lymphadenopathy:     Cervical: No cervical adenopathy.  Skin:    General: Skin is warm and dry.     Capillary Refill: Capillary refill takes less than 2 seconds.  Neurological:     Mental Status: She is alert and oriented to person, place, and time.  Psychiatric:        Behavior: Behavior normal.     Musculoskeletal Exam: C-spine has good range of motion.  Limited mobility of the lumbar spine.  Shoulder joints have good range of motion with no discomfort.  Tenderness  over the lateral epicondyle of both elbows.  Tenderness of both CMC joints.  Thickening of PIP and DIP joints.  Hip joints have good range of motion with no groin pain.  Right knee replacement has good range of motion.  Left knee joint has good range of motion no warmth or effusion.  Tenderness of the right ankle noted.  CDAI Exam: CDAI Score: -- Patient Global: --; Provider Global: -- Swollen: --; Tender: -- Joint Exam 12/08/2023   No joint exam has been documented for this visit   There is currently no information documented on the homunculus. Go to the Rheumatology activity and complete the homunculus joint exam.  Investigation: No additional findings.  Imaging: DG Foot Complete Right Result Date: 11/14/2023 Please see detailed radiograph report in office note.   Recent Labs: Lab Results  Component Value Date   WBC 5.8 11/24/2023   HGB 14.4 11/24/2023   PLT 356 11/24/2023   NA 138 11/24/2023   K 4.8 11/24/2023   CL 101 11/24/2023   CO2 21 11/24/2023   GLUCOSE 87 11/24/2023   BUN 15 11/24/2023   CREATININE 0.51 (L) 11/24/2023   BILITOT <0.2 11/24/2023   ALKPHOS 104 11/24/2023   AST 36 11/24/2023   ALT 70 (H) 11/24/2023   PROT 6.6 11/24/2023   ALBUMIN 4.5 11/24/2023   CALCIUM 9.5 11/24/2023   GFRAA 122 10/28/2020    Speciality Comments: PLQ Eye Exam: 09/28/2023 WNL @ [facility] follow up in 1 year.   Procedures:  No procedures performed Allergies: Levofloxacin, Bactrim ds [sulfamethoxazole-trimethoprim], Lipitor [atorvastatin], Tape, Zetia [ezetimibe], Codeine, and Other   .  Assessment / Plan:     Visit Diagnoses: Seronegative rheumatoid arthritis (HCC) - Anti-CCP negative, RF negative, ESR within normal limits on 07/12/2023, ultrasound of both hands positive for synovitis on 08/04/2023: Patient was previously prescribed Plaquenil 200 mg 1 tablet by mouth twice daily Monday through Friday.  Patient has had 2 interruptions in therapy since initiating  Plaquenil.  She initially felt the Plaquenil is causing increased weight gain and discontinued.  Most recently she discontinued Plaquenil in 11/28/2023 due to the concern for blurry vision and elevated liver enzymes.  Of note the patient has remained on meloxicam 15 mg 1 tablet  by mouth daily.  Plan to recheck hepatic function panel today along with hepatitis panel.  She would like to hold off on resuming Plaquenil at this time.  Discussed that she is not a good candidate for methotrexate or Arava due to elevated LFTs.  She is apprehensive to initiate a biologic due to possible side effects.  Discussed the option of sulfasalazine in the future if she has any new or worsening symptoms and if her LFTs return to WNL.  She will follow-up in the office in 3 months or sooner if needed.  High risk medication use - Discontinued Plaquenil on 11/28/23. Previous plaquenil dose: 200 mg 1 tablet by mouth twice daily Monday through Friday--patient was concerned that Plaquenil increased her liver enzymes.  She was also noticing blurry vision while taking plaquenil.  PLQ Eye Exam: 09/28/2023 WNL @ [facility] follow up in 1 year. CBC and CMP were updated on 11/24/2023--ALT was 70-hepatic function panel rechecked today along with hepatitis panel.  Elevated LFTs -AST within normal limits and ALT was elevated at 70 on 11/24/2023.  Patient has been taking meloxicam 15 mg 1 tablet daily.  She discontinued Plaquenil due to the concern that Plaquenil was increasing her liver enzymes.  Discussed that hepatotoxicity is not a common side effect of Plaquenil use.  Plan to obtain the following lab work today for further evaluation.  Plan: Hepatitis B core antibody, IgM, Hepatitis A antibody, IgM, Hepatitis B surface antigen, Hepatitis C antibody, Hepatic function panel  Primary osteoarthritis of both hands - X-rays of both hands were consistent with osteoarthritis and inflammatory arthritis overlap.  Patient continues to have  chronic pain in both hands.  She has tenderness over both CMC joints.  No active inflammation noted today.  She has been taking meloxicam 15 mg 1 tablet daily.  Lateral epicondylitis of both elbows: Patient has ongoing pain in both elbows consistent with lateral epicondylitis.  No tenderness or inflammation along the joint line.  Synovial cyst of popliteal space, unspecified laterality  Status post total knee replacement, right - 2020 right total knee replacement by Dr. Christell Constant. She has been experiencing some increased discomfort in her right knee replacement.  She followed up with her surgeon recently for further evaluation.  If her symptoms persist or worsen they are planning to proceed with a bone scan for further evaluation.  Pes cavus of both feet: Patient experiences intermittent discomfort in both ankles particularly the right ankle.  Her symptoms are alleviated by taking meloxicam 15 mg 1 tablet daily.  Primary osteoarthritis of both feet: Under care of Dr. Jamse Arn.  Chronic pain of both ankles, right greater than left.  Her symptoms are alleviated by taking meloxicam.  Degeneration of intervertebral disc of lumbar region without discogenic back pain or lower extremity pain - S/p laminectomy September 20, 2022.PLIF L3-L4, L4-L5 by Dr. Lovell Sheehan February 28, 2023. Chronic pain.  Limited mobility.  Trochanteric bursitis of both hips: Intermittent discomfort.  No groin pain with range of motion.  Other idiopathic scoliosis, lumbar region  Other medical conditions are listed as follows:  Primary hypertension: Blood pressure was 131/83 today in the office.  History of COPD  Aortic atherosclerosis (HCC)  Mixed hyperlipidemia  Prediabetes  Tobacco abuse  Gastroesophageal reflux disease without esophagitis  GAD (generalized anxiety disorder)  Other migraine without status migrainosus, not intractable  Seasonal allergic rhinitis due to pollen  Family history of rheumatoid arthritis-maternal  uncle and maternal grandfather  Family history of psoriasis-son and granddaughter  Orders: Orders Placed This Encounter  Procedures   Hepatitis B core antibody, IgM   Hepatitis A antibody, IgM   Hepatitis B surface antigen   Hepatitis C antibody   Hepatic function panel   No orders of the defined types were placed in this encounter.  Follow-Up Instructions: Return in about 4 months (around 04/08/2024) for Rheumatoid arthritis, Osteoarthritis.   Gearldine Bienenstock, PA-C  Note - This record has been created using Dragon software.  Chart creation errors have been sought, but may not always  have been located. Such creation errors do not reflect on  the standard of medical care.

## 2023-11-25 DIAGNOSIS — M25561 Pain in right knee: Secondary | ICD-10-CM | POA: Diagnosis not present

## 2023-11-25 DIAGNOSIS — Z96651 Presence of right artificial knee joint: Secondary | ICD-10-CM | POA: Diagnosis not present

## 2023-11-25 DIAGNOSIS — Z471 Aftercare following joint replacement surgery: Secondary | ICD-10-CM | POA: Diagnosis not present

## 2023-11-25 LAB — LIPID PANEL
Chol/HDL Ratio: 2.7 ratio (ref 0.0–4.4)
Cholesterol, Total: 152 mg/dL (ref 100–199)
HDL: 56 mg/dL (ref >39)
LDL Chol Calc (NIH): 73 mg/dL (ref 0–99)
Triglycerides: 129 mg/dL (ref 0–149)
VLDL Cholesterol Cal: 23 mg/dL (ref 5–40)

## 2023-11-25 LAB — CBC WITH DIFFERENTIAL/PLATELET
Basophils Absolute: 0 10*3/uL (ref 0.0–0.2)
Basos: 1 %
EOS (ABSOLUTE): 0.4 10*3/uL (ref 0.0–0.4)
Eos: 6 %
Hematocrit: 43.6 % (ref 34.0–46.6)
Hemoglobin: 14.4 g/dL (ref 11.1–15.9)
Immature Grans (Abs): 0 10*3/uL (ref 0.0–0.1)
Immature Granulocytes: 0 %
Lymphocytes Absolute: 1.4 10*3/uL (ref 0.7–3.1)
Lymphs: 24 %
MCH: 30.8 pg (ref 26.6–33.0)
MCHC: 33 g/dL (ref 31.5–35.7)
MCV: 93 fL (ref 79–97)
Monocytes Absolute: 0.5 10*3/uL (ref 0.1–0.9)
Monocytes: 8 %
Neutrophils Absolute: 3.6 10*3/uL (ref 1.4–7.0)
Neutrophils: 61 %
Platelets: 356 10*3/uL (ref 150–450)
RBC: 4.67 x10E6/uL (ref 3.77–5.28)
RDW: 12.4 % (ref 11.7–15.4)
WBC: 5.8 10*3/uL (ref 3.4–10.8)

## 2023-11-25 LAB — COMPREHENSIVE METABOLIC PANEL
ALT: 70 IU/L — ABNORMAL HIGH (ref 0–32)
AST: 36 IU/L (ref 0–40)
Albumin: 4.5 g/dL (ref 3.8–4.9)
Alkaline Phosphatase: 104 IU/L (ref 44–121)
BUN/Creatinine Ratio: 29 — ABNORMAL HIGH (ref 9–23)
BUN: 15 mg/dL (ref 6–24)
Bilirubin Total: <0.2 mg/dL (ref 0.0–1.2)
CO2: 21 mmol/L (ref 20–29)
Calcium: 9.5 mg/dL (ref 8.7–10.2)
Chloride: 101 mmol/L (ref 96–106)
Creatinine, Ser: 0.51 mg/dL — ABNORMAL LOW (ref 0.57–1.00)
Globulin, Total: 2.1 g/dL (ref 1.5–4.5)
Glucose: 87 mg/dL (ref 70–99)
Potassium: 4.8 mmol/L (ref 3.5–5.2)
Sodium: 138 mmol/L (ref 134–144)
Total Protein: 6.6 g/dL (ref 6.0–8.5)
eGFR: 108 mL/min/{1.73_m2} (ref >59)

## 2023-11-25 LAB — HEMOGLOBIN A1C
Est. average glucose Bld gHb Est-mCnc: 117 mg/dL
Hgb A1c MFr Bld: 5.7 % — ABNORMAL HIGH (ref 4.8–5.6)

## 2023-11-26 DIAGNOSIS — J309 Allergic rhinitis, unspecified: Secondary | ICD-10-CM | POA: Insufficient documentation

## 2023-11-26 NOTE — Assessment & Plan Note (Signed)
 Patient on Ozempic, but not experiencing weight loss. -Continue Ozempic 2 mg injection SQ weekly - Labs drawn today - Referring to medical nutrition therapy

## 2023-11-26 NOTE — Assessment & Plan Note (Addendum)
 Recently restarted Advair -Ensure patient is rinsing mouth after use to prevent oral thrush.

## 2023-11-26 NOTE — Assessment & Plan Note (Addendum)
 Well controlled.  No changes to medicines.  Intolerant to lipitor and zetia. Taking Repatha 140 mg every 14 days. Continue to work on eating a healthy diet and exercise.  Referring to medial nutrition therapy Labs drawn today.

## 2023-11-26 NOTE — Assessment & Plan Note (Signed)
Continue omeprazole 40 mg daily.  Continue pepcid 40 mg daily as needed.

## 2023-11-26 NOTE — Assessment & Plan Note (Signed)
 Patient has reduced smoking to one pack per day and is using 21mg  nicotine patches. - Continue current nicotine patch regimen. - Encourage further reduction in smoking because the following recurring symptoms of sore throat, allergies, and respiratory infections will continue if she doesn't quit.

## 2023-11-26 NOTE — Assessment & Plan Note (Signed)
 Continue flexeril to 10 mg three times a day as needed and lyrica 75 mg twice daily.

## 2023-11-26 NOTE — Assessment & Plan Note (Addendum)
 The current medical regimen is effective;  continue present plan and medications.  Sent singulair.

## 2023-11-26 NOTE — Assessment & Plan Note (Signed)
 Well controlled.  No changes to medicines. Amlodipine 10 mg daily, furosemide 20 mg as needed Labs drawn today.

## 2023-11-27 ENCOUNTER — Encounter: Payer: Self-pay | Admitting: Family Medicine

## 2023-11-27 DIAGNOSIS — M069 Rheumatoid arthritis, unspecified: Secondary | ICD-10-CM | POA: Insufficient documentation

## 2023-11-27 NOTE — Assessment & Plan Note (Signed)
 Symptoms managed with Plaquenil. Knee pain concerning post-replacement. Ultrasound confirmed hand inflammation. - Continue Plaquenil. - Follow up with orthopedic surgeon for knee pain evaluation.

## 2023-11-28 NOTE — Progress Notes (Signed)
 ALT is elevated-70--please clarify if she has been taking any tylenol, alcohol, NSAIDs, or any other medication changes?

## 2023-12-01 ENCOUNTER — Encounter: Payer: Self-pay | Admitting: Family Medicine

## 2023-12-01 ENCOUNTER — Other Ambulatory Visit: Payer: Self-pay

## 2023-12-01 MED ORDER — FUROSEMIDE 20 MG PO TABS: 20.0000 mg | ORAL_TABLET | Freq: Every day | ORAL | Status: DC | PRN

## 2023-12-02 DIAGNOSIS — R200 Anesthesia of skin: Secondary | ICD-10-CM | POA: Diagnosis not present

## 2023-12-02 DIAGNOSIS — R202 Paresthesia of skin: Secondary | ICD-10-CM | POA: Diagnosis not present

## 2023-12-05 ENCOUNTER — Other Ambulatory Visit: Payer: Self-pay

## 2023-12-05 MED ORDER — FUROSEMIDE 20 MG PO TABS: 20.0000 mg | ORAL_TABLET | Freq: Every day | ORAL | 2 refills | Status: DC | PRN

## 2023-12-06 DIAGNOSIS — M79605 Pain in left leg: Secondary | ICD-10-CM | POA: Diagnosis not present

## 2023-12-06 DIAGNOSIS — R200 Anesthesia of skin: Secondary | ICD-10-CM | POA: Insufficient documentation

## 2023-12-08 ENCOUNTER — Ambulatory Visit: Payer: Medicaid Other | Attending: Physician Assistant | Admitting: Physician Assistant

## 2023-12-08 ENCOUNTER — Encounter: Payer: Self-pay | Admitting: Physician Assistant

## 2023-12-08 VITALS — BP 131/83 | HR 80 | Resp 16 | Ht 66.0 in | Wt 177.0 lb

## 2023-12-08 DIAGNOSIS — Z720 Tobacco use: Secondary | ICD-10-CM

## 2023-12-08 DIAGNOSIS — M7061 Trochanteric bursitis, right hip: Secondary | ICD-10-CM | POA: Diagnosis not present

## 2023-12-08 DIAGNOSIS — M7120 Synovial cyst of popliteal space [Baker], unspecified knee: Secondary | ICD-10-CM

## 2023-12-08 DIAGNOSIS — R7989 Other specified abnormal findings of blood chemistry: Secondary | ICD-10-CM | POA: Diagnosis not present

## 2023-12-08 DIAGNOSIS — Z840 Family history of diseases of the skin and subcutaneous tissue: Secondary | ICD-10-CM

## 2023-12-08 DIAGNOSIS — M19071 Primary osteoarthritis, right ankle and foot: Secondary | ICD-10-CM | POA: Diagnosis not present

## 2023-12-08 DIAGNOSIS — I10 Essential (primary) hypertension: Secondary | ICD-10-CM | POA: Diagnosis not present

## 2023-12-08 DIAGNOSIS — M19042 Primary osteoarthritis, left hand: Secondary | ICD-10-CM

## 2023-12-08 DIAGNOSIS — M51369 Other intervertebral disc degeneration, lumbar region without mention of lumbar back pain or lower extremity pain: Secondary | ICD-10-CM

## 2023-12-08 DIAGNOSIS — Z1159 Encounter for screening for other viral diseases: Secondary | ICD-10-CM | POA: Diagnosis not present

## 2023-12-08 DIAGNOSIS — Z79899 Other long term (current) drug therapy: Secondary | ICD-10-CM

## 2023-12-08 DIAGNOSIS — Z96651 Presence of right artificial knee joint: Secondary | ICD-10-CM

## 2023-12-08 DIAGNOSIS — Q6671 Congenital pes cavus, right foot: Secondary | ICD-10-CM | POA: Diagnosis not present

## 2023-12-08 DIAGNOSIS — M7712 Lateral epicondylitis, left elbow: Secondary | ICD-10-CM

## 2023-12-08 DIAGNOSIS — M7711 Lateral epicondylitis, right elbow: Secondary | ICD-10-CM

## 2023-12-08 DIAGNOSIS — M79641 Pain in right hand: Secondary | ICD-10-CM

## 2023-12-08 DIAGNOSIS — F411 Generalized anxiety disorder: Secondary | ICD-10-CM

## 2023-12-08 DIAGNOSIS — G43809 Other migraine, not intractable, without status migrainosus: Secondary | ICD-10-CM

## 2023-12-08 DIAGNOSIS — M19072 Primary osteoarthritis, left ankle and foot: Secondary | ICD-10-CM

## 2023-12-08 DIAGNOSIS — Z8709 Personal history of other diseases of the respiratory system: Secondary | ICD-10-CM

## 2023-12-08 DIAGNOSIS — M19041 Primary osteoarthritis, right hand: Secondary | ICD-10-CM

## 2023-12-08 DIAGNOSIS — Z8261 Family history of arthritis: Secondary | ICD-10-CM

## 2023-12-08 DIAGNOSIS — G5603 Carpal tunnel syndrome, bilateral upper limbs: Secondary | ICD-10-CM | POA: Diagnosis not present

## 2023-12-08 DIAGNOSIS — K219 Gastro-esophageal reflux disease without esophagitis: Secondary | ICD-10-CM

## 2023-12-08 DIAGNOSIS — R7303 Prediabetes: Secondary | ICD-10-CM

## 2023-12-08 DIAGNOSIS — M0600 Rheumatoid arthritis without rheumatoid factor, unspecified site: Secondary | ICD-10-CM

## 2023-12-08 DIAGNOSIS — J301 Allergic rhinitis due to pollen: Secondary | ICD-10-CM

## 2023-12-08 DIAGNOSIS — E782 Mixed hyperlipidemia: Secondary | ICD-10-CM

## 2023-12-08 DIAGNOSIS — G5622 Lesion of ulnar nerve, left upper limb: Secondary | ICD-10-CM | POA: Diagnosis not present

## 2023-12-08 DIAGNOSIS — Q6672 Congenital pes cavus, left foot: Secondary | ICD-10-CM

## 2023-12-08 DIAGNOSIS — M7062 Trochanteric bursitis, left hip: Secondary | ICD-10-CM

## 2023-12-08 DIAGNOSIS — M4126 Other idiopathic scoliosis, lumbar region: Secondary | ICD-10-CM

## 2023-12-08 DIAGNOSIS — I700 Atherosclerosis of aorta: Secondary | ICD-10-CM

## 2023-12-09 LAB — HEPATIC FUNCTION PANEL
AG Ratio: 2.1 (calc) (ref 1.0–2.5)
ALT: 23 U/L (ref 6–29)
AST: 15 U/L (ref 10–35)
Albumin: 4.5 g/dL (ref 3.6–5.1)
Alkaline phosphatase (APISO): 97 U/L (ref 37–153)
Bilirubin, Direct: 0 mg/dL (ref 0.0–0.2)
Globulin: 2.1 g/dL (ref 1.9–3.7)
Indirect Bilirubin: 0.2 mg/dL (ref 0.2–1.2)
Total Bilirubin: 0.2 mg/dL (ref 0.2–1.2)
Total Protein: 6.6 g/dL (ref 6.1–8.1)

## 2023-12-09 LAB — HEPATITIS C ANTIBODY: Hepatitis C Ab: NONREACTIVE

## 2023-12-09 LAB — HEPATITIS B CORE ANTIBODY, IGM: Hep B C IgM: NONREACTIVE

## 2023-12-09 LAB — HEPATITIS B SURFACE ANTIGEN: Hepatitis B Surface Ag: NONREACTIVE

## 2023-12-09 LAB — HEPATITIS A ANTIBODY, IGM: Hep A IgM: NONREACTIVE

## 2023-12-09 NOTE — Progress Notes (Signed)
 Hepatic function panel WNL.   Hepatitis panel pending

## 2023-12-12 ENCOUNTER — Ambulatory Visit (INDEPENDENT_AMBULATORY_CARE_PROVIDER_SITE_OTHER)
Admit: 2023-12-12 | Discharge: 2023-12-12 | Disposition: A | Discharge status: Home / Self Care | Attending: Family Medicine | Admitting: Family Medicine

## 2023-12-12 ENCOUNTER — Ambulatory Visit (HOSPITAL_BASED_OUTPATIENT_CLINIC_OR_DEPARTMENT_OTHER)
Admission: EM | Admit: 2023-12-12 | Discharge: 2023-12-12 | Disposition: A | Discharge status: Home / Self Care | Attending: Family Medicine | Admitting: Family Medicine

## 2023-12-12 ENCOUNTER — Encounter (HOSPITAL_BASED_OUTPATIENT_CLINIC_OR_DEPARTMENT_OTHER): Payer: Self-pay

## 2023-12-12 DIAGNOSIS — R0781 Pleurodynia: Secondary | ICD-10-CM

## 2023-12-12 NOTE — ED Provider Notes (Signed)
 [name]    CSN: [id number] Arrival date & time: 12/12/23  1105      History   Chief Complaint Chief Complaint  Patient presents with   Abdominal Pain    HPI [name] is a 58 y.o. female.   Patient is a 58 year old female who presents today with right rib pain.  This has been present for many months.  Has had right upper quadrant ultrasound with negative results.  Postcholecystectomy.  Denies any associated symptoms related to the pain.  Denies any cough, congestion, fever, chills, urinary issues.  Does have a history of kidney stones.  Recent CT of lungs was reviewed with stable pulmonary nodule.   Abdominal Pain   Past Medical History:  Diagnosis Date   Allergic rhinitis    Anginal pain (HCC)    Arthritis    Blood transfusion without reported diagnosis    Cellulitis, face 06/08/2022   Complication of anesthesia    COPD (chronic obstructive pulmonary disease) (HCC)    Dental abscess 06/09/2022   GERD (gastroesophageal reflux disease)    History of colon polyps    History of kidney stones    HTN (hypertension)    Hyperlipidemia    Kidney stones    Pancreatitis    Pneumonia    PONV (postoperative nausea and vomiting)    Primary hypertension 10/20/2021   Psychotic depression (HCC)    Rheumatoid arthritis (HCC) 2024   Shingles February 26th, 2001   Tendinitis    both arms    Patient Active Problem List   Diagnosis Date Noted   Rheumatoid arthritis (HCC) 11/27/2023   Chronic allergic rhinitis 11/26/2023   Elevated liver enzymes 10/25/2023   Acute right flank pain 10/25/2023   Frequency of urination 10/25/2023   Wheezing 10/05/2023   Non-recurrent acute suppurative otitis media of right ear without spontaneous rupture of tympanic membrane 10/05/2023   Opacities of both lungs present on chest x-ray 10/05/2023   Tinnitus 09/08/2023   Non-seasonal allergic rhinitis due to pollen 09/08/2023   Seasonal allergic rhinitis due to pollen  06/16/2023   Greater trochanteric bursitis of left hip 04/12/2023   Degenerative scoliosis in adult patient 02/28/2023   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   Dental caries 01/04/2023   History of blood transfusion 01/04/2023   Failure of dental implant due to infection 01/04/2023   Lumbar back pain 09/14/2022   Tobacco abuse 06/09/2022   Aortic atherosclerosis (HCC) 05/08/2022   Chronic midline low back pain without sciatica 01/22/2022   Myalgia due to statin 01/22/2022   GERD (gastroesophageal reflux disease) 01/22/2022   Overweight with body mass index (BMI) of 28 to 28.9 in adult 12/31/2021   Prediabetes 10/20/2021   Migraine 10/20/2021   Primary hypertension 10/20/2021   DDD (degenerative disc disease), lumbar 06/17/2020   Neuropathy 04/08/2020   Simple chronic bronchitis (HCC) 01/06/2020   Other fatigue 01/01/2020   Spontaneous bruising 01/01/2020   Cigarette nicotine dependence with nicotine-induced disorder 01/01/2020   Mixed hyperlipidemia 01/01/2020   GAD (generalized anxiety disorder) 01/01/2020   Smoking 06/05/2019   Prinzmetal angina (HCC) 06/05/2019   Primary osteoarthritis of one knee, right 02/09/2019   Pain of left hip joint 12/19/2018   Osteoarthritis of carpometacarpal [facility]) joint of thumb 11/20/2018    Past Surgical History:  Procedure Laterality Date   ABDOMINAL HYSTERECTOMY     BACK SURGERY  02/2023   CARDIAC CATHETERIZATION  2008   CESAREAN SECTION     CHOLECYSTECTOMY  COLONOSCOPY  05/02/2017   Colonic polyp status post polypectomy. Small internal hemorrhoids   FRACTURE SURGERY Bilateral    [facility]   REPLACEMENT TOTAL KNEE Right 02/09/2019   TUBAL LIGATION      OB History   No obstetric history on file.      Home Medications    Prior to Admission medications   Medication Sig Start Date End Date Taking? Authorizing Provider  acetaminophen (TYLENOL) 650 MG CR tablet Take 650-1,300 mg by mouth every 8 (eight) hours as  needed for pain.    [provider]  ADVAIR [facility] 161-09 MCG/ACT inhaler INHALE 2 PUFFS INTO THE LUNGS TWICE DAILY 11/11/23   Cox, Fritzi Mandes, MD  albuterol (VENTOLIN HFA) 108 (90 Base) MCG/ACT inhaler INHALE 2 PUFFS INTO LUNGS EVERY 6 TO 8 HOURS AS NEEDED FOR WHEEZING 10/05/23   Renne Crigler, FNP  ALPRAZolam Prudy Feeler) 0.25 MG tablet TAKE 1 TABLET(0.25 MG) BY MOUTH DAILY AS NEEDED FOR ANXIETY 11/21/23   Cox, Kirsten, MD  amLODipine (NORVASC) 10 MG tablet TAKE 1 TABLET BY MOUTH DAILY 04/12/23   Blane Ohara, MD  aspirin EC 81 MG tablet Take 1 tablet (81 mg total) by mouth daily. Swallow whole. 08/05/23   Renne Crigler, FNP  azelastine (ASTELIN) 0.1 % nasal spray Place 2 sprays into both nostrils 2 (two) times daily. Use in each nostril as directed 09/05/23   Cox, Kirsten, MD  buPROPion (WELLBUTRIN XL) 300 MG 24 hr tablet TAKE 1 TABLET(300 MG) BY MOUTH DAILY. 11/20/23   Blane Ohara, MD  Cholecalciferol (VITAMIN D-3 PO) Take by mouth.    [provider]  cyclobenzaprine (FLEXERIL) 10 MG tablet Take 1 tablet (10 mg total) by mouth 3 (three) times daily. 11/24/23   Cox, Fritzi Mandes, MD  dicyclomine (BENTYL) 10 MG capsule TAKE 1 CAPSULE(10 MG) BY MOUTH FOUR TIMES DAILY BEFORE MEALS AND AT BEDTIME 04/12/23   Cox, Fritzi Mandes, MD  Docusate Sodium (DSS) 100 MG CAPS Take 100 mg by mouth daily as needed (constipation).    [provider]  famotidine (PEPCID) 40 MG tablet Take 1 tablet (40 mg total) by mouth daily as needed for heartburn or indigestion. 04/17/23   Cox, Fritzi Mandes, MD  fluticasone [facility]) 50 MCG/ACT nasal spray SHAKE LIQUID AND USE 2 SPRAYS IN EACH NOSTRIL DAILY 10/20/23   Cox, Kirsten, MD  furosemide (LASIX) 20 MG tablet Take 1 tablet (20 mg total) by mouth daily as needed for edema. 12/05/23   Cox, Fritzi Mandes, MD  gabapentin (NEURONTIN) 300 MG capsule Take 300 mg by mouth 3 (three) times daily.    [provider]  guaiFENesin (MUCINEX) 600 MG 12 hr tablet Take 1 tablet (600 mg total) by  mouth 2 (two) times daily. Patient taking differently: Take 600 mg by mouth as needed. 10/05/23   Renne Crigler, FNP  HYDROcodone-acetaminophen (NORCO/VICODIN) 5-325 MG tablet Take 1-2 tablets by mouth every 6 (six) hours as needed.    [provider]  hydroxychloroquine (PLAQUENIL) 200 MG tablet TAKE 1 TABLET BY MOUTH TWICE DAILY ON MONDAYS THROUGH FRIDAYS ONLY, DO NOT TAKE ON SATURDAY AND SUNDAY 10/31/23   Gearldine Bienenstock, PA-C  levocetirizine (XYZAL) 5 MG tablet TAKE 1 TABLET(5 MG) BY MOUTH EVERY EVENING 08/23/23   Cox, Kirsten, MD  MAGNESIUM PO Take 500 mg by mouth daily. 10/04/22   [provider]  meloxicam (MOBIC) 15 MG tablet Take 15 mg by mouth daily.    [provider]  metFORMIN (GLUCOPHAGE) 500 MG tablet Take  1 tablet (500 mg total) by mouth 2 (two) times daily with a meal. 10/25/23   Renne Crigler, FNP  montelukast (SINGULAIR) 10 MG tablet Take 1 tablet (10 mg total) by mouth at bedtime. 11/24/23   CoxFritzi Mandes, MD  Multiple Vitamins-Minerals (MULTIVITAMIN WITH MINERALS) tablet Take 1 tablet by mouth daily.    [provider]  nicotine (NICODERM CQ - DOSED IN MG/24 HOURS) 21 mg/24hr patch Place 1 patch (21 mg total) onto the skin daily. 11/24/23   Cox, Fritzi Mandes, MD  nitroGLYCERIN (NITROSTAT) 0.4 MG SL tablet Place 1 tablet (0.4 mg total) under the tongue every 5 (five) minutes as needed for chest pain. 08/05/23   Renne Crigler, FNP  omeprazole (PRILOSEC) 40 MG capsule TAKE 1 CAPSULE(40 MG) BY MOUTH DAILY 08/22/23   Cox, Fritzi Mandes, MD  REPATHA SURECLICK 140 MG/ML SOAJ ADMINISTER 1 ML(140MG ) UNDER THE SKIN EVERY 14 DAYS AS DIRECTED 09/08/23   Cox, Kirsten, MD  Semaglutide, 2 MG/DOSE, (OZEMPIC, 2 MG/DOSE,) 8 MG/3ML SOPN Inject 2 mg into the skin once a week. 11/24/23   Cox, Fritzi Mandes, MD  triamcinolone (KENALOG) 0.025 % ointment Apply 1 Application topically 2 (two) times daily. Apply to corners of mouth twice daily Patient taking differently: Apply 1 Application  topically as needed. Apply to corners of mouth twice daily 12/06/22   Cox, Fritzi Mandes, MD  TURMERIC PO Take 1,000 mg by mouth daily. With ginger    [provider]  VITAMIN E PO Take by mouth.    [provider]    Family History Family History  Problem Relation Age of Onset   Arthritis Mother    Lung disease Mother    Parkinson's disease Mother    COPD Father    Arthritis Father    Healthy Sister    Breast cancer Maternal Aunt        great aunt   Rheum arthritis Maternal Uncle    Rheum arthritis Maternal Grandfather    Healthy Son    High Cholesterol Son    Healthy Son    Colon cancer Cousin        mother's cousin   Diabetes Other    Stroke Other    Hypertension Other    Hyperlipidemia Other    Asthma Other    Heart failure Other    Thyroid disease Other    Heart attack Other    COPD Other    Arrhythmia Other    Arthritis Other    Migraines Other     Social History Social History   Tobacco Use   Smoking status: Every Day    Current packs/day: 1.00    Types: Cigarettes    Passive exposure: Past   Smokeless tobacco: Never  Vaping Use   Vaping status: Never Used  Substance Use Topics   Alcohol use: Yes    Comment: rarely   Drug use: No     Allergies   Levofloxacin, Bactrim ds [sulfamethoxazole-trimethoprim], Lipitor [atorvastatin], Tape, Zetia [ezetimibe], Codeine, and Other   Review of Systems Review of Systems  Gastrointestinal:  Positive for abdominal pain.  Musculoskeletal:  Positive for myalgias.     Physical Exam Triage Vital Signs ED Triage Vitals  Encounter Vitals Group     BP 12/12/23 1207 119/78     Systolic BP Percentile --      Diastolic BP Percentile --      Pulse Rate 12/12/23 1207 81     Resp 12/12/23 1207 20  Temp 12/12/23 1207 98.1 F (36.7 C)     Temp Source 12/12/23 1207 Oral     SpO2 12/12/23 1207 95 %     Weight --      Height --      Head Circumference --      Peak Flow --      Pain Score 12/12/23  1209 4     Pain Loc --      Pain Education --      Exclude from Growth Chart --    No data found.  Updated Vital Signs BP 119/78 (BP Location: Right Arm)   Pulse 81   Temp 98.1 F (36.7 C) (Oral)   Resp 20   LMP  (LMP Unknown) Comment: hysterectomy 2008  SpO2 95%   Visual Acuity Right Eye Distance:   Left Eye Distance:   Bilateral Distance:    Right Eye Near:   Left Eye Near:    Bilateral Near:     Physical Exam Constitutional:      General: She is not in acute distress.    Appearance: She is not ill-appearing or toxic-appearing.  Pulmonary:     Effort: Pulmonary effort is normal.  Musculoskeletal:       Back:     Comments: TTP No swelling, rashes  Skin:    General: Skin is warm and dry.  Neurological:     Mental Status: She is alert.  Psychiatric:        Mood and Affect: Mood normal.      UC Treatments / Results  Labs (all labs ordered are listed, but only abnormal results are displayed) Labs Reviewed - No data to display  EKG   Radiology DG Ribs Unilateral W/Chest Right Result Date: 12/12/2023 CLINICAL DATA:  Right-sided chest pain. EXAM: RIGHT RIBS AND CHEST - 3+ VIEW COMPARISON:  Chest radiograph dated 10/05/2023. FINDINGS: No focal consolidation, pleural effusion, or pneumothorax. The cardiac silhouette is within normal limits. No acute osseous pathology. No displaced rib fractures. IMPRESSION: 1. No acute cardiopulmonary process. 2. No displaced rib fractures. Electronically Signed   By: Elgie Collard M.D.   On: 12/12/2023 14:08    Procedures Procedures (including critical care time)  Medications Ordered in UC Medications - No data to display  Initial Impression / Assessment and Plan / UC Course  I have reviewed the triage vital signs and the nursing notes.  Pertinent labs & imaging results that were available during my care of the patient were reviewed by me and considered in my medical decision making (see chart for details).     Rib  pain-appears to be myalgia type pain. X-ray with no acute findings Reports that her pain medicine helps with this.  Recommend continue pain medication as needed and follow-up with her doctor for further evaluation and management. Final Clinical Impressions(s) / UC Diagnoses   Final diagnoses:  Rib pain     Discharge Instructions      I am not sure of the exact cause of your pain. We have not gotten the x-ray results back yet.  I will call when these arrive. You can continue with your pain medication as needed.    ED Prescriptions   None    PDMP not reviewed this encounter.   Janace Aris, FNP 12/12/23 1436

## 2023-12-12 NOTE — ED Triage Notes (Addendum)
 Patient had outpatient ultrasound done early February. Sees Dr. Sedalia Muta.  Pain to right side started approx 2-3 months ago. Patient states pain still persists and "no one is figuring it out".  Presents today for additional evaluation. Asked patient if she had follow up after ultrasound and states she did but nothing else done for her. States has been using hydrocodone that was prescribed for her back pain and it has helped some. Tylenol alone not helping.

## 2023-12-12 NOTE — Progress Notes (Signed)
 Hepatitis Panel negative

## 2023-12-12 NOTE — Discharge Instructions (Signed)
 I am not sure of the exact cause of your pain. We have not gotten the x-ray results back yet.  I will call when these arrive. You can continue with your pain medication as needed.

## 2023-12-13 ENCOUNTER — Encounter: Payer: Self-pay | Admitting: Family Medicine

## 2023-12-13 ENCOUNTER — Ambulatory Visit: Admitting: Family Medicine

## 2023-12-13 VITALS — BP 108/66 | HR 88 | Temp 98.9°F | Resp 16 | Ht 66.0 in | Wt 177.0 lb

## 2023-12-13 DIAGNOSIS — J301 Allergic rhinitis due to pollen: Secondary | ICD-10-CM | POA: Diagnosis not present

## 2023-12-13 DIAGNOSIS — R519 Headache, unspecified: Secondary | ICD-10-CM

## 2023-12-13 DIAGNOSIS — K5904 Chronic idiopathic constipation: Secondary | ICD-10-CM | POA: Insufficient documentation

## 2023-12-13 DIAGNOSIS — R109 Unspecified abdominal pain: Secondary | ICD-10-CM | POA: Insufficient documentation

## 2023-12-13 DIAGNOSIS — E663 Overweight: Secondary | ICD-10-CM

## 2023-12-13 DIAGNOSIS — K581 Irritable bowel syndrome with constipation: Secondary | ICD-10-CM | POA: Diagnosis not present

## 2023-12-13 DIAGNOSIS — Z6828 Body mass index (BMI) 28.0-28.9, adult: Secondary | ICD-10-CM

## 2023-12-13 DIAGNOSIS — R1031 Right lower quadrant pain: Secondary | ICD-10-CM | POA: Insufficient documentation

## 2023-12-13 MED ORDER — LUBIPROSTONE 24 MCG PO CAPS: 24.0000 ug | ORAL_CAPSULE | Freq: Two times a day (BID) | ORAL | 5 refills | Status: DC

## 2023-12-13 MED ORDER — TIRZEPATIDE-WEIGHT MANAGEMENT 2.5 MG/0.5ML ~~LOC~~ SOLN: 2.5000 mg | SUBCUTANEOUS | 0 refills | Status: DC

## 2023-12-13 NOTE — Patient Instructions (Signed)
 Start on zepbound 2.5 mg weekly. Stop ozempic when you get the zepbound.  Start on [place] one twice daily. May take a stool softener.

## 2023-12-13 NOTE — Progress Notes (Unsigned)
 Subjective:  Patient ID: [name], female    DOB: Feb 07, 1966  Age: 58 y.o. MRN: [id number]  Chief Complaint  Patient presents with   Flank Pain   Discussed the use of AI scribe software for clinical note transcription with the patient, who gave verbal consent to proceed.  History of Present Illness   The patient presents with persistent right-sided abdominal pain, described as 'almost right under your rib.' The pain is constant, rating it as a 3-4/10, and does not appear to be exacerbated by food. She has had an ultrasound and x-rays, both of which were reportedly normal. She has been taking hydrocodone for back pain, which also seems to alleviate the abdominal discomfort.  The patient also reports constipation, despite taking Linzess and dicyclomine. She has a bowel movement about once a day, but describes the stool as 'hard.' She has previously had her gallbladder removed.  In addition, the patient has been experiencing recurrent sinus headaches. She is currently taking Xyzal at night and Singulair in the morning, as well as using Flonase daily. She has also been taking Dayquil for symptom relief.  The patient has a history of liver issues, which she believes were caused by Plaquenil. She has since stopped taking Plaquenil and recent liver function tests were normal. She has started taking turmeric and ginger supplements for arthritis, which she reports has been helpful.  Finally, the patient mentions weight gain and is currently on Ozempic. She has been trying to increase her physical activity and has seen a nutritionist, but has not seen significant improvements.        Patient presents with side pain that she states she has had for 3 or 4 months.       08/05/2023    7:44 AM 04/12/2023    7:47 AM 06/24/2022    2:28 PM  Depression screen PHQ 2/9  Decreased Interest 1 1 0  Down, Depressed, Hopeless 1 1 0  PHQ - 2 Score 2 2 0  Altered sleeping 2 2   Tired, decreased  energy 1 2   Change in appetite 1 0   Feeling bad or failure about yourself  1 1   Trouble concentrating 1 0   Moving slowly or fidgety/restless 0 0   Suicidal thoughts 0 0   PHQ-9 Score 8 7   Difficult doing work/chores Not difficult at all Not difficult at all         08/05/2023    7:44 AM  Fall Risk   Falls in the past year? 0  Number falls in past yr: 0  Follow up Falls evaluation completed    Patient Care Team: Blane Ohara, MD as PCP - General (Family Medicine) Mariea Stable, MD as Referring Physician (Internal Medicine)   Review of Systems  Constitutional:  Negative for chills, diaphoresis, fatigue and fever.  HENT:  Positive for congestion and sneezing. Negative for ear pain and sinus pain.   Respiratory:  Negative for cough, shortness of breath and wheezing.   Cardiovascular:  Negative for chest pain.  Gastrointestinal:  Positive for constipation. Negative for abdominal pain, diarrhea, nausea and vomiting.  Genitourinary:  Negative for dysuria.  Musculoskeletal:  Negative for arthralgias.  Skin: Negative.   Neurological:  Positive for headaches. Negative for weakness.  Psychiatric/Behavioral:  Negative for dysphoric mood. The patient is not nervous/anxious.     Current Outpatient Medications on File Prior to Visit  Medication Sig Dispense Refill   acetaminophen (TYLENOL) 650 MG CR tablet  Take 650-1,300 mg by mouth every 8 (eight) hours as needed for pain.     ADVAIR HFA 115-21 MCG/ACT inhaler INHALE 2 PUFFS INTO THE LUNGS TWICE DAILY 36 g 0   albuterol (VENTOLIN HFA) 108 (90 Base) MCG/ACT inhaler INHALE 2 PUFFS INTO LUNGS EVERY 6 TO 8 HOURS AS NEEDED FOR WHEEZING 18 g 1   ALPRAZolam (XANAX) 0.25 MG tablet TAKE 1 TABLET(0.25 MG) BY MOUTH DAILY AS NEEDED FOR ANXIETY 30 tablet 1   amLODipine (NORVASC) 10 MG tablet TAKE 1 TABLET BY MOUTH DAILY 90 tablet 0   aspirin EC 81 MG tablet Take 1 tablet (81 mg total) by mouth daily. Swallow whole.     azelastine (ASTELIN)  0.1 % nasal spray Place 2 sprays into both nostrils 2 (two) times daily. Use in each nostril as directed 30 mL 12   buPROPion (WELLBUTRIN XL) 300 MG 24 hr tablet TAKE 1 TABLET(300 MG) BY MOUTH DAILY. 90 tablet 1   Cholecalciferol (VITAMIN D-3 PO) Take by mouth.     cyclobenzaprine (FLEXERIL) 10 MG tablet Take 1 tablet (10 mg total) by mouth 3 (three) times daily. 102 each 0   dicyclomine (BENTYL) 10 MG capsule TAKE 1 CAPSULE(10 MG) BY MOUTH FOUR TIMES DAILY BEFORE MEALS AND AT BEDTIME 120 capsule 2   Docusate Sodium (DSS) 100 MG CAPS Take 100 mg by mouth daily as needed (constipation).     famotidine (PEPCID) 40 MG tablet Take 1 tablet (40 mg total) by mouth daily as needed for heartburn or indigestion. 90 tablet 1   fluticasone (FLONASE) 50 MCG/ACT nasal spray SHAKE LIQUID AND USE 2 SPRAYS IN EACH NOSTRIL DAILY 16 g 6   furosemide (LASIX) 20 MG tablet Take 1 tablet (20 mg total) by mouth daily as needed for edema. 30 tablet 2   gabapentin (NEURONTIN) 300 MG capsule Take 300 mg by mouth 3 (three) times daily.     guaiFENesin (MUCINEX) 600 MG 12 hr tablet Take 1 tablet (600 mg total) by mouth 2 (two) times daily. (Patient taking differently: Take 600 mg by mouth as needed.) 60 tablet 1   HYDROcodone-acetaminophen (NORCO/VICODIN) 5-325 MG tablet Take 1-2 tablets by mouth every 6 (six) hours as needed.     levocetirizine (XYZAL) 5 MG tablet TAKE 1 TABLET(5 MG) BY MOUTH EVERY EVENING 90 tablet 0   MAGNESIUM PO Take 500 mg by mouth daily.     meloxicam (MOBIC) 15 MG tablet Take 15 mg by mouth daily.     metFORMIN (GLUCOPHAGE) 500 MG tablet Take 1 tablet (500 mg total) by mouth 2 (two) times daily with a meal. 180 tablet 3   montelukast (SINGULAIR) 10 MG tablet Take 1 tablet (10 mg total) by mouth at bedtime. 90 tablet 0   Multiple Vitamins-Minerals (MULTIVITAMIN WITH MINERALS) tablet Take 1 tablet by mouth daily.     nicotine (NICODERM CQ - DOSED IN MG/24 HOURS) 21 mg/24hr patch Place 1 patch (21 mg  total) onto the skin daily. 28 patch 0   nitroGLYCERIN (NITROSTAT) 0.4 MG SL tablet Place 1 tablet (0.4 mg total) under the tongue every 5 (five) minutes as needed for chest pain. 50 tablet 3   omeprazole (PRILOSEC) 40 MG capsule TAKE 1 CAPSULE(40 MG) BY MOUTH DAILY 90 capsule 3   REPATHA SURECLICK 140 MG/ML SOAJ ADMINISTER 1 ML(140MG ) UNDER THE SKIN EVERY 14 DAYS AS DIRECTED 2 mL 2   triamcinolone (KENALOG) 0.025 % ointment Apply 1 Application topically 2 (two) times daily. Apply to corners  of mouth twice daily (Patient taking differently: Apply 1 Application topically as needed. Apply to corners of mouth twice daily) 30 g 0   TURMERIC PO Take 1,000 mg by mouth daily. With ginger     VITAMIN E PO Take by mouth.     No current facility-administered medications on file prior to visit.   Past Medical History:  Diagnosis Date   Allergic rhinitis    Anginal pain (HCC)    Arthritis    Blood transfusion without reported diagnosis    Cellulitis, face 06/08/2022   Complication of anesthesia    COPD (chronic obstructive pulmonary disease) (HCC)    Dental abscess 06/09/2022   GERD (gastroesophageal reflux disease)    History of colon polyps    History of kidney stones    HTN (hypertension)    Hyperlipidemia    Kidney stones    Pancreatitis    Pneumonia    PONV (postoperative nausea and vomiting)    Primary hypertension 10/20/2021   Psychotic depression (HCC)    Rheumatoid arthritis (HCC) 2024   Shingles February 26th, 2001   Tendinitis    both arms   Past Surgical History:  Procedure Laterality Date   ABDOMINAL HYSTERECTOMY     BACK SURGERY  02/2023   CARDIAC CATHETERIZATION  2008   CESAREAN SECTION     CHOLECYSTECTOMY     COLONOSCOPY  05/02/2017   Colonic polyp status post polypectomy. Small internal hemorrhoids   FRACTURE SURGERY Bilateral    [facility]   REPLACEMENT TOTAL KNEE Right 02/09/2019   TUBAL LIGATION      Family History  Problem Relation Age of Onset   Arthritis  Mother    Lung disease Mother    Parkinson's disease Mother    COPD Father    Arthritis Father    Healthy Sister    Breast cancer Maternal Aunt        great aunt   Rheum arthritis Maternal Uncle    Rheum arthritis Maternal Grandfather    Healthy Son    High Cholesterol Son    Healthy Son    Colon cancer Cousin        mother's cousin   Diabetes Other    Stroke Other    Hypertension Other    Hyperlipidemia Other    Asthma Other    Heart failure Other    Thyroid disease Other    Heart attack Other    COPD Other    Arrhythmia Other    Arthritis Other    Migraines Other    Social History   Socioeconomic History   Marital status: Widowed    Spouse name: Not on file   Number of children: 2   Years of education: Not on file   Highest education level: GED or equivalent  Occupational History   Not on file  Tobacco Use   Smoking status: Every Day    Current packs/day: 1.00    Types: Cigarettes    Passive exposure: Past   Smokeless tobacco: Never  Vaping Use   Vaping status: Never Used  Substance and Sexual Activity   Alcohol use: Yes    Comment: rarely   Drug use: No   Sexual activity: Not Currently  Other Topics Concern   Not on file  Social History Narrative   Not on file   Social Drivers of Health   Financial Resource Strain: Medium Risk (08/05/2023)   Overall Financial Resource Strain (CARDIA)    Difficulty of Paying Living  Expenses: Somewhat hard  Food Insecurity: Food Insecurity Present (08/05/2023)   Hunger Vital Sign    Worried About Running Out of Food in the Last Year: Often true    Ran Out of Food in the Last Year: Sometimes true  Transportation Needs: No Transportation Needs (08/05/2023)   PRAPARE - Administrator, Civil Service (Medical): No    Lack of Transportation (Non-Medical): No  Physical Activity: Inactive (08/05/2023)   Exercise Vital Sign    Days of Exercise per Week: 0 days    Minutes of Exercise per Session: 0 min   Stress: No Stress Concern Present (08/05/2023)   [employer] of Occupational Health - Occupational Stress Questionnaire    Feeling of Stress : Only a little  Social Connections: Moderately Integrated (08/05/2023)   Social Connection and Isolation Panel [NHANES]    Frequency of Communication with Friends and Family: More than three times a week    Frequency of Social Gatherings with Friends and Family: Twice a week    Attends Religious Services: More than 4 times per year    Active Member of [employer] or Organizations: Yes    Attends Banker Meetings: 1 to 4 times per year    Marital Status: Widowed    Objective:  BP 108/66   Pulse 88   Temp 98.9 F (37.2 C) (Temporal)   Resp 16   Ht 5\' 6"  (1.676 m)   Wt 177 lb (80.3 kg)   LMP  (LMP Unknown) Comment: hysterectomy 2008  SpO2 96%   BMI 28.57 kg/m      12/13/2023    8:59 AM 12/12/2023   12:07 PM 12/08/2023   10:09 AM  BP/Weight  Systolic BP 108 119 131  Diastolic BP 66 78 83  Wt. (Lbs) 177  177  BMI 28.57 kg/m2  28.57 kg/m2    Physical Exam  Diabetic Foot Exam - Simple   No data filed      Lab Results  Component Value Date   WBC 5.8 11/24/2023   HGB 14.4 11/24/2023   HCT 43.6 11/24/2023   PLT 356 11/24/2023   GLUCOSE 87 11/24/2023   CHOL 152 11/24/2023   TRIG 129 11/24/2023   HDL 56 11/24/2023   LDLCALC 73 11/24/2023   ALT 23 12/08/2023   AST 15 12/08/2023   NA 138 11/24/2023   K 4.8 11/24/2023   CL 101 11/24/2023   CREATININE 0.51 (L) 11/24/2023   BUN 15 11/24/2023   CO2 21 11/24/2023   TSH 0.955 08/05/2023   INR 0.8 01/04/2023   HGBA1C 5.7 (H) 11/24/2023      Assessment & Plan:  Assessment and Plan    Right-sided abdominal pain Chronic right-sided abdominal pain, constant with fluctuating intensity, located under the ribs. Not exacerbated by food and unresponsive to acetaminophen or cyclobenzaprine. Previous ultrasound and x-ray were unremarkable. Differential diagnosis includes  musculoskeletal pain, gastrointestinal issues, or referred pain. Some relief with hydrocodone, used for back pain. - Continue hydrocodone for pain management as needed - Consider referral to gastroenterology for further evaluation if pain persists  Constipation Chronic constipation with hard daily bowel movements. Linaclotide has been ineffective, and dicyclomine aids bowel movements but not pain. Advised against docusate due to dehydration risk. Plan to adjust linaclotide dosage. - Provide linaclotide samples to try a different dosage - Encourage continued use of dicyclomine for bowel movement regulation - Consider alternative constipation treatments if current regimen remains ineffective  Chronic sinus headaches Recurrent sinus headaches  without fever. Currently using levocetirizine, montelukast, and fluticasone. Nasal examination suggests mild allergic rhinitis. Using Dayquil for symptom relief. - Continue current medications (levocetirizine, montelukast, fluticasone) - Consider alternative treatments if headaches persist  Weight management Weight gain despite exercise and dietary modifications. Currently on semaglutide. Discussed switching to tirzepatide (Zepbound/Mounjaro) for weight loss, potentially covered by Medicaid. Zepbound may benefit those with sleep apnea. - Discontinue semaglutide if tirzepatide is approved - Prescribe tirzepatide for weight management - Send prescription to [facility] in [place]         Chronic idiopathic constipation  RLQ abdominal pain  Irritable bowel syndrome with constipation  Non-seasonal allergic rhinitis due to pollen  Overweight with body mass index (BMI) of 28 to 28.9 in adult  Other orders -     Tirzepatide-Weight Management; Inject 2.5 mg into the skin once a week.  Dispense: 2 mL; Refill: 0 -     Lubiprostone; Take 1 capsule (24 mcg total) by mouth 2 (two) times daily with a meal.  Dispense: 60 capsule; Refill: 5     Meds ordered  this encounter  Medications   tirzepatide (ZEPBOUND) 2.5 MG/0.5ML injection vial    Sig: Inject 2.5 mg into the skin once a week.    Dispense:  2 mL    Refill:  0   lubiprostone (AMITIZA) 24 MCG capsule    Sig: Take 1 capsule (24 mcg total) by mouth 2 (two) times daily with a meal.    Dispense:  60 capsule    Refill:  5    No orders of the defined types were placed in this encounter.    Follow-up: Return in about 6 weeks (around 01/24/2024) for chronic follow up .   I,Angela Taylor,acting as a Neurosurgeon for Blane Ohara, MD.,have documented all relevant documentation on the behalf of Blane Ohara, MD,as directed by  Blane Ohara, MD while in the presence of Blane Ohara, MD.   An After Visit Summary was printed and given to the patient.  I attest that I have reviewed this visit and agree with the plan scribed by my staff.   Blane Ohara, MD [facility] [phone]

## 2023-12-14 DIAGNOSIS — T8484XA Pain due to internal orthopedic prosthetic devices, implants and grafts, initial encounter: Secondary | ICD-10-CM | POA: Diagnosis not present

## 2023-12-14 DIAGNOSIS — M25561 Pain in right knee: Secondary | ICD-10-CM | POA: Diagnosis not present

## 2023-12-14 DIAGNOSIS — R519 Headache, unspecified: Secondary | ICD-10-CM | POA: Insufficient documentation

## 2023-12-14 DIAGNOSIS — Z96651 Presence of right artificial knee joint: Secondary | ICD-10-CM | POA: Diagnosis not present

## 2023-12-14 NOTE — Assessment & Plan Note (Addendum)
 Chronic constipation with hard daily bowel movements. Start amitiza 24 mcg one oral twice daily.  - Encourage continued use of dicyclomine for bowel movement regulation.

## 2023-12-14 NOTE — Assessment & Plan Note (Signed)
 Weight gain despite exercise and dietary modifications. Currently on semaglutide. Discussed switching to tirzepatide (Zepbound/Mounjaro) for weight loss, potentially covered by Medicaid. Zepbound may benefit those with sleep apnea. - Discontinue semaglutide if tirzepatide is approved - Prescribe tirzepatide for weight management - Send prescription to [facility] in [place]

## 2023-12-14 NOTE — Assessment & Plan Note (Addendum)
 Chronic constipation with hard daily bowel movements. Start on amitiza 24 mcg once twice daily and continue dicyclomine. - Encouraged continued use of dicyclomine.

## 2023-12-14 NOTE — Assessment & Plan Note (Signed)
 Currently using levocetirizine, montelukast, and fluticasone.

## 2023-12-14 NOTE — Assessment & Plan Note (Addendum)
 Chronic right-sided abdominal pain, constant with fluctuating intensity, located under the ribs. Not exacerbated by food and unresponsive to acetaminophen or cyclobenzaprine. Previous ultrasound and x-ray were unremarkable. Differential diagnosis includes musculoskeletal pain, gastrointestinal issues (IBS) , or referred pain. Some relief with hydrocodone, used for back pain. Hopefully starting amitiza will help with constipation. Continue dicyclomine before meals.  - Consider referral to gastroenterology for further evaluation if pain persists

## 2023-12-14 NOTE — Assessment & Plan Note (Addendum)
 Recurrent sinus headaches without fever. Currently using levocetirizine, montelukast, and fluticasone. Nasal examination suggests mild allergic rhinitis. Using Dayquil for symptom relief. - Continue current medications (levocetirizine, montelukast, fluticasone)

## 2023-12-15 ENCOUNTER — Other Ambulatory Visit: Payer: Self-pay | Admitting: Family Medicine

## 2023-12-15 ENCOUNTER — Encounter: Payer: Self-pay | Admitting: Family Medicine

## 2023-12-15 MED ORDER — AMOXICILLIN-POT CLAVULANATE 875-125 MG PO TABS: 1.0000 | ORAL_TABLET | Freq: Two times a day (BID) | ORAL | 0 refills | Status: DC

## 2023-12-16 ENCOUNTER — Other Ambulatory Visit: Payer: Self-pay | Admitting: Family Medicine

## 2023-12-16 DIAGNOSIS — G5622 Lesion of ulnar nerve, left upper limb: Secondary | ICD-10-CM | POA: Diagnosis not present

## 2023-12-19 ENCOUNTER — Ambulatory Visit: Payer: Medicaid Other | Admitting: Pulmonary Disease

## 2023-12-19 ENCOUNTER — Other Ambulatory Visit: Payer: Self-pay | Admitting: Physician Assistant

## 2023-12-19 DIAGNOSIS — R7303 Prediabetes: Secondary | ICD-10-CM

## 2023-12-19 MED ORDER — TIRZEPATIDE 2.5 MG/0.5ML ~~LOC~~ SOAJ: 2.5000 mg | SUBCUTANEOUS | 0 refills | Status: DC

## 2023-12-23 ENCOUNTER — Ambulatory Visit: Admitting: Family Medicine

## 2023-12-23 ENCOUNTER — Encounter: Payer: Self-pay | Admitting: Family Medicine

## 2023-12-23 ENCOUNTER — Other Ambulatory Visit: Payer: Self-pay | Admitting: Family Medicine

## 2023-12-23 VITALS — BP 108/66 | HR 97 | Temp 97.8°F | Resp 16 | Ht 66.0 in | Wt 171.2 lb

## 2023-12-23 DIAGNOSIS — F17219 Nicotine dependence, cigarettes, with unspecified nicotine-induced disorders: Secondary | ICD-10-CM

## 2023-12-23 DIAGNOSIS — J309 Allergic rhinitis, unspecified: Secondary | ICD-10-CM | POA: Diagnosis not present

## 2023-12-23 DIAGNOSIS — H65112 Acute and subacute allergic otitis media (mucoid) (sanguinous) (serous), left ear: Secondary | ICD-10-CM | POA: Insufficient documentation

## 2023-12-23 DIAGNOSIS — J441 Chronic obstructive pulmonary disease with (acute) exacerbation: Secondary | ICD-10-CM | POA: Insufficient documentation

## 2023-12-23 MED ORDER — TRIAMCINOLONE ACETONIDE 40 MG/ML IJ SUSP
80.0000 mg | Freq: Once | INTRAMUSCULAR | Status: AC
Start: 2023-12-23 — End: 2023-12-23
  Administered 2023-12-23: 80 mg via INTRAMUSCULAR

## 2023-12-23 MED ORDER — NICOTINE 21 MG/24HR TD PT24: 21.0000 mg | MEDICATED_PATCH | Freq: Every day | TRANSDERMAL | 0 refills | Status: DC

## 2023-12-23 MED ORDER — AMOXICILLIN-POT CLAVULANATE 875-125 MG PO TABS: 1.0000 | ORAL_TABLET | Freq: Two times a day (BID) | ORAL | 0 refills | Status: DC

## 2023-12-23 MED ORDER — PREDNISONE 20 MG PO TABS: ORAL_TABLET | ORAL | 0 refills | Status: AC

## 2023-12-23 MED ORDER — MONTELUKAST SODIUM 10 MG PO TABS: 10.0000 mg | ORAL_TABLET | Freq: Every day | ORAL | 0 refills | Status: AC

## 2023-12-23 NOTE — Addendum Note (Signed)
 Addended by: Renne Crigler on: 12/23/2023 10:45 AM   Modules accepted: Level of Service

## 2023-12-23 NOTE — Assessment & Plan Note (Signed)
 Smoke-free for 4-5 days using nicotine patches. - Prescribe nicotine patches. - Encourage continued smoking cessation.

## 2023-12-23 NOTE — Assessment & Plan Note (Signed)
 Symptoms consistent with acute sinusitis. Left ear redness suggests possible infection extension. - Prescribe another course of Augmentin. - Administer steroid injection for inflammation. - Consider short course of prednisone if no improvement, noting side effects.

## 2023-12-23 NOTE — Assessment & Plan Note (Signed)
 Worsening symptoms likely due to high pollen levels. Inconsistent antihistamine use and Singulair causes dryness. Astelin nasal spray is unpleasant. - Encourage consistent Singulair use, refill sent - Continue Xyzal. - Educate on Astelin nasal spray use.

## 2023-12-23 NOTE — Assessment & Plan Note (Addendum)
 Increased inhaler use due to high pollen levels. Current inhaler regimen appropriate. - Continue current inhaler regimen. - administer Kenalog IM today - Monitor symptoms and adjust treatment as necessary.

## 2023-12-23 NOTE — Progress Notes (Signed)
 Subjective:  Patient ID: [name], female    DOB: Oct 12, 1965  Age: 58 y.o. MRN: [id number]  Chief Complaint  Patient presents with   Nasal Congestion    Cough Sneezing Watery eyes    Discussed the use of AI scribe software for clinical note transcription with the patient, who gave verbal consent to proceed.  HPI [name] is a 58 year old female who presents with worsening respiratory symptoms and ear pain for 1 week.  She experiences worsening respiratory symptoms, including increased wheezing and a persistent cough, which she attributes to the recent increase in pollen. She uses advair twice daily and albuterol as needed, approximately once or twice a day. She also uses her mother's Aflo vest to help clear mucus from her lungs.  She has been on a course of Augmentin for sinus issues, with one pill remaining from a ten-day course. Her symptoms, including a scratchy throat and headache, have persisted despite antibiotic treatment. No fever is present, but she uses a neti pot to alleviate sinus pressure.  She experiences ear pain, primarily in the left ear, which she suspects may be related to her dental issues, specifically her denture. The pain is intermittent and sometimes associated with her bite.  Her medication regimen includes Singulair, which she has been taking less frequently due to it causing excessive dryness. She also uses Xyzal at night and occasionally uses Astelin, although she dislikes its taste.  She struggles with allergies, which are exacerbated by the pollen here and in [place], where she frequently travels to visit family.  She has recently stopped smoking and is considering using nicotine patches to aid in cessation.  She mentions a recent weight loss of about six pounds.      08/05/2023    7:44 AM 04/12/2023    7:47 AM 06/24/2022    2:28 PM  Depression screen PHQ 2/9  Decreased Interest 1 1 0  Down, Depressed, Hopeless 1 1 0  PHQ - 2 Score  2 2 0  Altered sleeping 2 2   Tired, decreased energy 1 2   Change in appetite 1 0   Feeling bad or failure about yourself  1 1   Trouble concentrating 1 0   Moving slowly or fidgety/restless 0 0   Suicidal thoughts 0 0   PHQ-9 Score 8 7   Difficult doing work/chores Not difficult at all Not difficult at all         08/05/2023    7:44 AM  Fall Risk   Falls in the past year? 0  Number falls in past yr: 0  Follow up Falls evaluation completed    Patient Care Team: Blane Ohara, MD as PCP - General (Family Medicine) Mariea Stable, MD as Referring Physician (Internal Medicine)   Review of Systems  Constitutional:  Positive for fatigue. Negative for chills and fever.  HENT:  Positive for congestion, rhinorrhea, sinus pressure, sinus pain and sneezing.   Eyes:  Positive for itching.  Respiratory:  Positive for cough and wheezing.   Cardiovascular: Negative.   Gastrointestinal:  Positive for nausea. Negative for constipation, diarrhea and vomiting.  Endocrine: Negative.   Genitourinary:  Negative for dyspareunia, flank pain, frequency and urgency.  Musculoskeletal:  Positive for back pain.  Skin: Negative.   Allergic/Immunologic: Negative.   Neurological:  Positive for dizziness and headaches.  Hematological: Negative.   Psychiatric/Behavioral: Negative.      Current Outpatient Medications on File Prior to Visit  Medication Sig  Dispense Refill   acetaminophen (TYLENOL) 650 MG CR tablet Take 650-1,300 mg by mouth every 8 (eight) hours as needed for pain.     ADVAIR HFA 115-21 MCG/ACT inhaler INHALE 2 PUFFS INTO THE LUNGS TWICE DAILY 36 g 0   albuterol (VENTOLIN HFA) 108 (90 Base) MCG/ACT inhaler INHALE 2 PUFFS INTO LUNGS EVERY 6 TO 8 HOURS AS NEEDED FOR WHEEZING 18 g 1   ALPRAZolam (XANAX) 0.25 MG tablet TAKE 1 TABLET(0.25 MG) BY MOUTH DAILY AS NEEDED FOR ANXIETY 30 tablet 1   amLODipine (NORVASC) 10 MG tablet TAKE 1 TABLET BY MOUTH DAILY 90 tablet 0   aspirin EC 81 MG  tablet Take 1 tablet (81 mg total) by mouth daily. Swallow whole.     azelastine (ASTELIN) 0.1 % nasal spray Place 2 sprays into both nostrils 2 (two) times daily. Use in each nostril as directed 30 mL 12   buPROPion (WELLBUTRIN XL) 300 MG 24 hr tablet TAKE 1 TABLET(300 MG) BY MOUTH DAILY. 90 tablet 1   chlorhexidine (PERIDEX) 0.12 % solution Use as directed 5 mLs in the mouth or throat 2 (two) times daily.     Cholecalciferol (VITAMIN D-3 PO) Take by mouth.     cyclobenzaprine (FLEXERIL) 10 MG tablet Take 1 tablet (10 mg total) by mouth 3 (three) times daily. 102 each 0   dicyclomine (BENTYL) 10 MG capsule TAKE 1 CAPSULE(10 MG) BY MOUTH FOUR TIMES DAILY BEFORE MEALS AND AT BEDTIME 120 capsule 2   Docusate Sodium (DSS) 100 MG CAPS Take 100 mg by mouth daily as needed (constipation).     famotidine (PEPCID) 40 MG tablet Take 1 tablet (40 mg total) by mouth daily as needed for heartburn or indigestion. 90 tablet 1   fluticasone (FLONASE) 50 MCG/ACT nasal spray SHAKE LIQUID AND USE 2 SPRAYS IN EACH NOSTRIL DAILY 16 g 6   furosemide (LASIX) 20 MG tablet Take 1 tablet (20 mg total) by mouth daily as needed for edema. 30 tablet 2   gabapentin (NEURONTIN) 300 MG capsule Take 300 mg by mouth 3 (three) times daily.     guaiFENesin (MUCINEX) 600 MG 12 hr tablet Take 1 tablet (600 mg total) by mouth 2 (two) times daily. (Patient taking differently: Take 600 mg by mouth as needed.) 60 tablet 1   HYDROcodone-acetaminophen (NORCO/VICODIN) 5-325 MG tablet Take 1-2 tablets by mouth every 6 (six) hours as needed.     levocetirizine (XYZAL) 5 MG tablet TAKE 1 TABLET(5 MG) BY MOUTH EVERY EVENING 90 tablet 0   lubiprostone (AMITIZA) 24 MCG capsule Take 1 capsule (24 mcg total) by mouth 2 (two) times daily with a meal. 60 capsule 5   MAGNESIUM PO Take 500 mg by mouth daily.     meloxicam (MOBIC) 15 MG tablet Take 15 mg by mouth daily.     metFORMIN (GLUCOPHAGE) 500 MG tablet Take 1 tablet (500 mg total) by mouth 2  (two) times daily with a meal. 180 tablet 3   Multiple Vitamins-Minerals (MULTIVITAMIN WITH MINERALS) tablet Take 1 tablet by mouth daily.     nitroGLYCERIN (NITROSTAT) 0.4 MG SL tablet Place 1 tablet (0.4 mg total) under the tongue every 5 (five) minutes as needed for chest pain. 50 tablet 3   omeprazole (PRILOSEC) 40 MG capsule TAKE 1 CAPSULE(40 MG) BY MOUTH DAILY 90 capsule 3   REPATHA SURECLICK 140 MG/ML SOAJ ADMINISTER 1 ML(140MG ) UNDER THE SKIN EVERY 14 DAYS AS DIRECTED 2 mL 2   tirzepatide (MOUNJARO) 2.5 MG/0.5ML  Pen Inject 2.5 mg into the skin once a week. 2 mL 0   triamcinolone (KENALOG) 0.025 % ointment Apply 1 Application topically 2 (two) times daily. Apply to corners of mouth twice daily (Patient taking differently: Apply 1 Application topically as needed. Apply to corners of mouth twice daily) 30 g 0   TURMERIC PO Take 1,000 mg by mouth daily. With ginger     VITAMIN E PO Take by mouth.     No current facility-administered medications on file prior to visit.   Past Medical History:  Diagnosis Date   Allergic rhinitis    Anginal pain (HCC)    Arthritis    Blood transfusion without reported diagnosis    Cellulitis, face 06/08/2022   Complication of anesthesia    COPD (chronic obstructive pulmonary disease) (HCC)    Dental abscess 06/09/2022   GERD (gastroesophageal reflux disease)    History of colon polyps    History of kidney stones    HTN (hypertension)    Hyperlipidemia    Kidney stones    Pancreatitis    Pneumonia    PONV (postoperative nausea and vomiting)    Primary hypertension 10/20/2021   Psychotic depression (HCC)    Rheumatoid arthritis (HCC) 2024   Shingles February 26th, 2001   Tendinitis    both arms   Past Surgical History:  Procedure Laterality Date   ABDOMINAL HYSTERECTOMY     BACK SURGERY  02/2023   CARDIAC CATHETERIZATION  2008   CESAREAN SECTION     CHOLECYSTECTOMY     COLONOSCOPY  05/02/2017   Colonic polyp status post polypectomy. Small  internal hemorrhoids   FRACTURE SURGERY Bilateral    femur   REPLACEMENT TOTAL KNEE Right 02/09/2019   TUBAL LIGATION      Family History  Problem Relation Age of Onset   Arthritis Mother    Lung disease Mother    Parkinson's disease Mother    COPD Father    Arthritis Father    Healthy Sister    Breast cancer Maternal Aunt        great aunt   Rheum arthritis Maternal Uncle    Rheum arthritis Maternal Grandfather    Healthy Son    High Cholesterol Son    Healthy Son    Colon cancer Cousin        mother's cousin   Diabetes Other    Stroke Other    Hypertension Other    Hyperlipidemia Other    Asthma Other    Heart failure Other    Thyroid disease Other    Heart attack Other    COPD Other    Arrhythmia Other    Arthritis Other    Migraines Other    Social History   Socioeconomic History   Marital status: Widowed    Spouse name: Not on file   Number of children: 2   Years of education: Not on file   Highest education level: GED or equivalent  Occupational History   Not on file  Tobacco Use   Smoking status: Every Day    Current packs/day: 1.00    Types: Cigarettes    Passive exposure: Past   Smokeless tobacco: Never  Vaping Use   Vaping status: Never Used  Substance and Sexual Activity   Alcohol use: Yes    Comment: rarely   Drug use: No   Sexual activity: Not Currently  Other Topics Concern   Not on file  Social History Narrative  Not on file   Social Drivers of Health   Financial Resource Strain: Medium Risk (08/05/2023)   Overall Financial Resource Strain (CARDIA)    Difficulty of Paying Living Expenses: Somewhat hard  Food Insecurity: Food Insecurity Present (08/05/2023)   Hunger Vital Sign    Worried About Running Out of Food in the Last Year: Often true    Ran Out of Food in the Last Year: Sometimes true  Transportation Needs: No Transportation Needs (08/05/2023)   PRAPARE - Administrator, Civil Service (Medical): No    Lack  of Transportation (Non-Medical): No  Physical Activity: Inactive (08/05/2023)   Exercise Vital Sign    Days of Exercise per Week: 0 days    Minutes of Exercise per Session: 0 min  Stress: No Stress Concern Present (08/05/2023)   [employer] of Occupational Health - Occupational Stress Questionnaire    Feeling of Stress : Only a little  Social Connections: Moderately Integrated (08/05/2023)   Social Connection and Isolation Panel [NHANES]    Frequency of Communication with Friends and Family: More than three times a week    Frequency of Social Gatherings with Friends and Family: Twice a week    Attends Religious Services: More than 4 times per year    Active Member of [employer] or Organizations: Yes    Attends Banker Meetings: 1 to 4 times per year    Marital Status: Widowed    Objective:  BP 108/66   Pulse 97   Temp 97.8 F (36.6 C) (Temporal)   Resp 16   Ht 5\' 6"  (1.676 m)   Wt 171 lb 3.2 oz (77.7 kg)   LMP  (LMP Unknown) Comment: hysterectomy 2008  SpO2 98%   BMI 27.63 kg/m      12/23/2023    9:22 AM 12/13/2023    8:59 AM 12/12/2023   12:07 PM  BP/Weight  Systolic BP 108 108 119  Diastolic BP 66 66 78  Wt. (Lbs) 171.2 177   BMI 27.63 kg/m2 28.57 kg/m2     Physical Exam Constitutional:      General: She is not in acute distress.    Appearance: Normal appearance. She is ill-appearing.  HENT:     Right Ear: Tympanic membrane normal.     Left Ear: A middle ear effusion is present. Tympanic membrane is erythematous.     Mouth/Throat:     Pharynx: Posterior oropharyngeal erythema present.  Eyes:     Conjunctiva/sclera: Conjunctivae normal.  Cardiovascular:     Rate and Rhythm: Normal rate and regular rhythm.     Heart sounds: Normal heart sounds. No murmur heard. Pulmonary:     Effort: Pulmonary effort is normal.     Breath sounds: Examination of the left-upper field reveals wheezing and rhonchi. Wheezing and rhonchi present.  Abdominal:      General: Bowel sounds are normal.     Palpations: Abdomen is soft.     Tenderness: There is no abdominal tenderness.  Musculoskeletal:        General: Normal range of motion.     Cervical back: Normal range of motion.  Neurological:     Mental Status: She is alert. Mental status is at baseline.  Psychiatric:        Mood and Affect: Mood normal.        Behavior: Behavior normal.       Lab Results  Component Value Date   WBC 5.8 11/24/2023   HGB 14.4 11/24/2023  HCT 43.6 11/24/2023   PLT 356 11/24/2023   GLUCOSE 87 11/24/2023   CHOL 152 11/24/2023   TRIG 129 11/24/2023   HDL 56 11/24/2023   LDLCALC 73 11/24/2023   ALT 23 12/08/2023   AST 15 12/08/2023   NA 138 11/24/2023   K 4.8 11/24/2023   CL 101 11/24/2023   CREATININE 0.51 (L) 11/24/2023   BUN 15 11/24/2023   CO2 21 11/24/2023   TSH 0.955 08/05/2023   INR 0.8 01/04/2023   HGBA1C 5.7 (H) 11/24/2023      Assessment & Plan:   Non-recurrent acute allergic otitis media of left ear Assessment & Plan: Symptoms consistent with acute sinusitis. Left ear redness suggests possible infection extension. - Prescribe another course of Augmentin. - Administer steroid injection for inflammation. - Consider short course of prednisone if no improvement, noting side effects.  Orders: -     Amoxicillin-Pot Clavulanate; Take 1 tablet by mouth 2 (two) times daily.  Dispense: 20 tablet; Refill: 0  Cigarette nicotine dependence with nicotine-induced disorder Assessment & Plan: Smoke-free for 4-5 days using nicotine patches. - Prescribe nicotine patches. - Encourage continued smoking cessation.  Orders: -     Nicotine; Place 1 patch (21 mg total) onto the skin daily.  Dispense: 28 patch; Refill: 0  COPD exacerbation (HCC) Assessment & Plan: Increased inhaler use due to high pollen levels. Current inhaler regimen appropriate. - Continue current inhaler regimen. - administer Kenalog IM today - Monitor symptoms and adjust  treatment as necessary.  Orders: -     Triamcinolone Acetonide -     predniSONE; Take 3 tablets (60 mg total) by mouth daily with breakfast for 3 days, THEN 2 tablets (40 mg total) daily with breakfast for 3 days, THEN 1 tablet (20 mg total) daily with breakfast for 3 days.  Dispense: 18 tablet; Refill: 0  Chronic allergic rhinitis Assessment & Plan: Worsening symptoms likely due to high pollen levels. Inconsistent antihistamine use and Singulair causes dryness. Astelin nasal spray is unpleasant. - Administer Kenalog injection. - Encourage consistent Singulair use. - Continue Xyzal. - Educate on Astelin nasal spray use.  Orders: -     Montelukast Sodium; Take 1 tablet (10 mg total) by mouth at bedtime.  Dispense: 90 tablet; Refill: 0     Meds ordered this encounter  Medications   triamcinolone acetonide (KENALOG-40) injection 80 mg   amoxicillin-clavulanate (AUGMENTIN) 875-125 MG tablet    Sig: Take 1 tablet by mouth 2 (two) times daily.    Dispense:  20 tablet    Refill:  0   nicotine (NICODERM CQ - DOSED IN MG/24 HOURS) 21 mg/24hr patch    Sig: Place 1 patch (21 mg total) onto the skin daily.    Dispense:  28 patch    Refill:  0   montelukast (SINGULAIR) 10 MG tablet    Sig: Take 1 tablet (10 mg total) by mouth at bedtime.    Dispense:  90 tablet    Refill:  0   predniSONE (DELTASONE) 20 MG tablet    Sig: Take 3 tablets (60 mg total) by mouth daily with breakfast for 3 days, THEN 2 tablets (40 mg total) daily with breakfast for 3 days, THEN 1 tablet (20 mg total) daily with breakfast for 3 days.    Dispense:  18 tablet    Refill:  0    No orders of the defined types were placed in this encounter.    Follow-up: Return if symptoms worsen or fail  to improve.  An After Visit Summary was printed and given to the patient.  Total time spent on today's visit was 36 minutes, including both face-to-face time and nonface-to-face time personally spent on review of chart (labs and  imaging), discussing labs and goals, discussing further work-up, treatment options, referrals to specialist if needed, reviewing outside records if pertinent, answering patient's questions, and coordinating care.    Lajuana Matte, FNP [facility] [phone]

## 2023-12-23 NOTE — Assessment & Plan Note (Signed)
 Worsening symptoms likely due to high pollen levels. Inconsistent antihistamine use and Singulair causes dryness. Astelin nasal spray is unpleasant. - Administer Kenalog injection. - Encourage consistent Singulair use. - Continue Xyzal. - Educate on Astelin nasal spray use.

## 2023-12-28 ENCOUNTER — Telehealth: Payer: Self-pay

## 2023-12-28 NOTE — Telephone Encounter (Signed)
 Mounjaro was prescribed per Kennon Rounds. Insurance will not cover until at least two preferred drugs, Byetta, Trulicity, Victoza and Ozempic are tried and failed. Patient has only tried Ozempic. Please advise.Marland KitchenMarland Kitchen

## 2023-12-29 ENCOUNTER — Other Ambulatory Visit: Payer: Self-pay | Admitting: Family Medicine

## 2023-12-29 DIAGNOSIS — J301 Allergic rhinitis due to pollen: Secondary | ICD-10-CM

## 2024-01-03 NOTE — Telephone Encounter (Signed)
Spoke with patient, verbalized understanding and had no questions at this time.  

## 2024-01-03 NOTE — Telephone Encounter (Signed)
 Spoke with patient, verbalized understanding and had no questions at this time. Per Dr. Reinhold Carbine stay on ozempic.

## 2024-01-10 ENCOUNTER — Other Ambulatory Visit: Payer: Self-pay | Admitting: Family Medicine

## 2024-01-24 DIAGNOSIS — R8290 Unspecified abnormal findings in urine: Secondary | ICD-10-CM | POA: Diagnosis not present

## 2024-01-24 DIAGNOSIS — Z1231 Encounter for screening mammogram for malignant neoplasm of breast: Secondary | ICD-10-CM | POA: Diagnosis not present

## 2024-01-24 DIAGNOSIS — Z0000 Encounter for general adult medical examination without abnormal findings: Secondary | ICD-10-CM | POA: Diagnosis not present

## 2024-01-24 LAB — HM MAMMOGRAPHY

## 2024-01-25 ENCOUNTER — Ambulatory Visit: Admitting: Family Medicine

## 2024-01-27 DIAGNOSIS — M47816 Spondylosis without myelopathy or radiculopathy, lumbar region: Secondary | ICD-10-CM | POA: Diagnosis not present

## 2024-01-30 ENCOUNTER — Other Ambulatory Visit: Payer: Self-pay | Admitting: Family Medicine

## 2024-01-30 DIAGNOSIS — F411 Generalized anxiety disorder: Secondary | ICD-10-CM

## 2024-01-31 ENCOUNTER — Ambulatory Visit: Admitting: Family Medicine

## 2024-01-31 DIAGNOSIS — Z96651 Presence of right artificial knee joint: Secondary | ICD-10-CM | POA: Diagnosis not present

## 2024-01-31 DIAGNOSIS — M25561 Pain in right knee: Secondary | ICD-10-CM | POA: Diagnosis not present

## 2024-01-31 DIAGNOSIS — T8484XA Pain due to internal orthopedic prosthetic devices, implants and grafts, initial encounter: Secondary | ICD-10-CM | POA: Diagnosis not present

## 2024-02-02 ENCOUNTER — Ambulatory Visit: Admitting: Pulmonary Disease

## 2024-02-06 ENCOUNTER — Encounter: Payer: Self-pay | Admitting: Family Medicine

## 2024-02-06 ENCOUNTER — Ambulatory Visit: Admitting: Family Medicine

## 2024-02-06 VITALS — BP 108/62 | HR 98 | Temp 98.1°F | Resp 16 | Ht 66.0 in | Wt 175.0 lb

## 2024-02-06 DIAGNOSIS — R200 Anesthesia of skin: Secondary | ICD-10-CM | POA: Insufficient documentation

## 2024-02-06 DIAGNOSIS — F17219 Nicotine dependence, cigarettes, with unspecified nicotine-induced disorders: Secondary | ICD-10-CM | POA: Diagnosis not present

## 2024-02-06 DIAGNOSIS — F411 Generalized anxiety disorder: Secondary | ICD-10-CM

## 2024-02-06 DIAGNOSIS — R7303 Prediabetes: Secondary | ICD-10-CM | POA: Diagnosis not present

## 2024-02-06 DIAGNOSIS — M545 Low back pain, unspecified: Secondary | ICD-10-CM | POA: Diagnosis not present

## 2024-02-06 DIAGNOSIS — M47816 Spondylosis without myelopathy or radiculopathy, lumbar region: Secondary | ICD-10-CM | POA: Insufficient documentation

## 2024-02-06 DIAGNOSIS — N760 Acute vaginitis: Secondary | ICD-10-CM | POA: Insufficient documentation

## 2024-02-06 DIAGNOSIS — K5904 Chronic idiopathic constipation: Secondary | ICD-10-CM | POA: Diagnosis not present

## 2024-02-06 MED ORDER — ALPRAZOLAM 0.25 MG PO TABS: ORAL_TABLET | ORAL | 1 refills | Status: DC

## 2024-02-06 MED ORDER — CYCLOBENZAPRINE HCL 10 MG PO TABS: 10.0000 mg | ORAL_TABLET | Freq: Three times a day (TID) | ORAL | 0 refills | Status: DC

## 2024-02-06 MED ORDER — DICYCLOMINE HCL 10 MG PO CAPS: ORAL_CAPSULE | ORAL | 2 refills | Status: DC

## 2024-02-06 MED ORDER — NICOTINE 21 MG/24HR TD PT24: 21.0000 mg | MEDICATED_PATCH | Freq: Every day | TRANSDERMAL | 1 refills | Status: DC

## 2024-02-06 MED ORDER — SEMAGLUTIDE (2 MG/DOSE) 8 MG/3ML ~~LOC~~ SOPN: 2.0000 mg | PEN_INJECTOR | SUBCUTANEOUS | 0 refills | Status: DC

## 2024-02-06 NOTE — Progress Notes (Signed)
 Subjective:  Patient ID: [name], female    DOB: Jul 08, 1966  Age: 58 y.o. MRN: [id number]  Chief Complaint  Patient presents with   Medical Management of Chronic Issues    Medications    Discussed the use of AI scribe software for clinical note transcription with the patient, who gave verbal consent to proceed.  History of Present Illness   [name] is a 58 year old female who presents for a follow-up visit and medication refills.  She is seeking refills for Xanax , Flexeril , Bentyl , and Ozempic . Her Xanax  prescription was denied at the pharmacy due to the need for a visit. She has been taking Xanax  for a long time, initially prescribed after her husband's death, and it aids her sleep. Her usage varies, sometimes lasting a month or two.  She has been experiencing bothersome sinus issues. She has a history of smoking but reports a significant reduction since April 1st, with occasional lapses. Previously, she smoked two packs a day but has cut down significantly. She uses Nicoderm patches to aid in smoking cessation and requests to continue on the 21 mg patch. She recently bought a pack of cigarettes but has only smoked a few.  She takes hydrocodone  5/325 mg as needed for back pain, which sometimes lasts over a month. She also uses Bentyl  for stomach issues related to constipation, which she attributes to her hydrocodone  use.         02/06/2024   11:45 AM 08/05/2023    7:44 AM 04/12/2023    7:47 AM 06/24/2022    2:28 PM  Depression screen PHQ 2/9  Decreased Interest 2 1 1  0  Down, Depressed, Hopeless 2 1 1  0  PHQ - 2 Score 4 2 2  0  Altered sleeping 2 2 2    Tired, decreased energy 2 1 2    Change in appetite 2 1 0   Feeling bad or failure about yourself  2 1 1    Trouble concentrating 2 1 0   Moving slowly or fidgety/restless 0 0 0   Suicidal thoughts 0 0 0   PHQ-9 Score 14 8 7    Difficult doing work/chores Not difficult at all Not difficult at all Not difficult at  all         02/06/2024   11:45 AM  Fall Risk   Falls in the past year? 0  Number falls in past yr: 0  Injury with Fall? 0  Risk for fall due to : No Fall Risks  Follow up Falls evaluation completed    Patient Care Team: Mercy Stall, MD as PCP - General (Family Medicine) Annice Barthel, MD as Referring Physician (Internal Medicine)   Review of Systems  Constitutional:  Negative for chills, diaphoresis, fatigue and fever.  HENT:  Negative for congestion, ear pain and sinus pain.   Eyes: Negative.   Respiratory:  Negative for cough and shortness of breath.   Cardiovascular:  Negative for chest pain and leg swelling.  Gastrointestinal:  Negative for abdominal pain, constipation, nausea and vomiting.  Endocrine: Negative.   Genitourinary:  Negative for dysuria.  Musculoskeletal:  Negative for arthralgias.  Skin:  Negative for rash.  Allergic/Immunologic: Negative.   Neurological:  Negative for weakness and headaches.  Hematological: Negative.   Psychiatric/Behavioral:  Negative for dysphoric mood. The patient is not nervous/anxious.     Current Outpatient Medications on File Prior to Visit  Medication Sig Dispense Refill   acetaminophen  (TYLENOL ) 650 MG CR tablet Take 650-1,300  mg by mouth every 8 (eight) hours as needed for pain.     ADVAIR HFA 115-21 MCG/ACT inhaler INHALE 2 PUFFS INTO THE LUNGS TWICE DAILY 36 g 0   albuterol  (VENTOLIN  HFA) 108 (90 Base) MCG/ACT inhaler INHALE 2 PUFFS INTO LUNGS EVERY 6 TO 8 HOURS AS NEEDED FOR WHEEZING 18 g 1   amLODipine  (NORVASC ) 10 MG tablet TAKE 1 TABLET BY MOUTH DAILY 90 tablet 0   aspirin  EC 81 MG tablet Take 1 tablet (81 mg total) by mouth daily. Swallow whole.     azelastine  (ASTELIN ) 0.1 % nasal spray Place 2 sprays into both nostrils 2 (two) times daily. Use in each nostril as directed 30 mL 12   buPROPion  (WELLBUTRIN  XL) 300 MG 24 hr tablet TAKE 1 TABLET(300 MG) BY MOUTH DAILY. 90 tablet 1   chlorhexidine  (PERIDEX ) 0.12 %  solution Use as directed 5 mLs in the mouth or throat 2 (two) times daily.     Cholecalciferol (VITAMIN D -3 PO) Take by mouth.     Docusate Sodium  (DSS) 100 MG CAPS Take 100 mg by mouth daily as needed (constipation).     famotidine  (PEPCID ) 40 MG tablet Take 1 tablet (40 mg total) by mouth daily as needed for heartburn or indigestion. 90 tablet 1   fluticasone  (FLONASE ) 50 MCG/ACT nasal spray SHAKE LIQUID AND USE 2 SPRAYS IN EACH NOSTRIL DAILY 16 g 6   furosemide  (LASIX ) 20 MG tablet Take 1 tablet (20 mg total) by mouth daily as needed for edema. 30 tablet 2   guaiFENesin  (MUCINEX ) 600 MG 12 hr tablet Take 1 tablet (600 mg total) by mouth 2 (two) times daily. (Patient taking differently: Take 600 mg by mouth as needed.) 60 tablet 1   HYDROcodone -acetaminophen  (NORCO/VICODIN) 5-325 MG tablet Take 1-2 tablets by mouth every 6 (six) hours as needed.     levocetirizine (XYZAL ) 5 MG tablet TAKE 1 TABLET(5 MG) BY MOUTH EVERY EVENING 90 tablet 0   lubiprostone  (AMITIZA ) 24 MCG capsule Take 1 capsule (24 mcg total) by mouth 2 (two) times daily with a meal. 60 capsule 5   MAGNESIUM PO Take 500 mg by mouth daily.     meloxicam (MOBIC) 15 MG tablet Take 15 mg by mouth daily.     montelukast  (SINGULAIR ) 10 MG tablet Take 1 tablet (10 mg total) by mouth at bedtime. 90 tablet 0   Multiple Vitamins-Minerals (MULTIVITAMIN WITH MINERALS) tablet Take 1 tablet by mouth daily.     nitroGLYCERIN  (NITROSTAT ) 0.4 MG SL tablet Place 1 tablet (0.4 mg total) under the tongue every 5 (five) minutes as needed for chest pain. 50 tablet 3   omeprazole  (PRILOSEC) 40 MG capsule TAKE 1 CAPSULE(40 MG) BY MOUTH DAILY 90 capsule 3   REPATHA  SURECLICK 140 MG/ML SOAJ ADMINISTER 1 ML(140MG ) UNDER THE SKIN EVERY 14 DAYS AS DIRECTED 2 mL 2   triamcinolone  (KENALOG ) 0.025 % ointment Apply 1 Application topically 2 (two) times daily. Apply to corners of mouth twice daily (Patient taking differently: Apply 1 Application topically as needed.  Apply to corners of mouth twice daily) 30 g 0   TURMERIC PO Take 1,000 mg by mouth daily. With ginger     VITAMIN E PO Take by mouth.     gabapentin  (NEURONTIN ) 300 MG capsule Take 300 mg by mouth 3 (three) times daily. (Patient not taking: Reported on 02/06/2024)     metFORMIN  (GLUCOPHAGE ) 500 MG tablet Take 1 tablet (500 mg total) by mouth 2 (two) times daily  with a meal. (Patient not taking: Reported on 02/06/2024) 180 tablet 3   No current facility-administered medications on file prior to visit.   Past Medical History:  Diagnosis Date   Allergic rhinitis    Anginal pain (HCC)    Arthritis    Blood transfusion without reported diagnosis    Cellulitis, face 06/08/2022   Complication of anesthesia    COPD (chronic obstructive pulmonary disease) (HCC)    Dental abscess 06/09/2022   GERD (gastroesophageal reflux disease)    History of colon polyps    History of kidney stones    HTN (hypertension)    Hyperlipidemia    Kidney stones    Pancreatitis    Pneumonia    PONV (postoperative nausea and vomiting)    Primary hypertension 10/20/2021   Psychotic depression (HCC)    Rheumatoid arthritis (HCC) 2024   Shingles February 26th, 2001   Tendinitis    both arms   Past Surgical History:  Procedure Laterality Date   ABDOMINAL HYSTERECTOMY     BACK SURGERY  02/2023   CARDIAC CATHETERIZATION  2008   CESAREAN SECTION     CHOLECYSTECTOMY     COLONOSCOPY  05/02/2017   Colonic polyp status post polypectomy. Small internal hemorrhoids   FRACTURE SURGERY Bilateral    [facility]   REPLACEMENT TOTAL KNEE Right 02/09/2019   TUBAL LIGATION      Family History  Problem Relation Age of Onset   Arthritis Mother    Lung disease Mother    Parkinson's disease Mother    COPD Father    Arthritis Father    Healthy Sister    Breast cancer Maternal Aunt        great aunt   Rheum arthritis Maternal Uncle    Rheum arthritis Maternal Grandfather    Healthy Son    High Cholesterol Son    Healthy  Son    Colon cancer Cousin        mother's cousin   Diabetes Other    Stroke Other    Hypertension Other    Hyperlipidemia Other    Asthma Other    Heart failure Other    Thyroid  disease Other    Heart attack Other    COPD Other    Arrhythmia Other    Arthritis Other    Migraines Other    Social History   Socioeconomic History   Marital status: Widowed    Spouse name: Not on file   Number of children: 2   Years of education: Not on file   Highest education level: GED or equivalent  Occupational History   Not on file  Tobacco Use   Smoking status: Every Day    Current packs/day: 1.00    Types: Cigarettes    Passive exposure: Past   Smokeless tobacco: Never  Vaping Use   Vaping status: Never Used  Substance and Sexual Activity   Alcohol use: Yes    Comment: rarely   Drug use: No   Sexual activity: Not Currently  Other Topics Concern   Not on file  Social History Narrative   Not on file   Social Drivers of Health   Financial Resource Strain: Medium Risk (08/05/2023)   Overall Financial Resource Strain (CARDIA)    Difficulty of Paying Living Expenses: Somewhat hard  Food Insecurity: Food Insecurity Present (08/05/2023)   Hunger Vital Sign    Worried About Running Out of Food in the Last Year: Often true    Ran Out  of Food in the Last Year: Sometimes true  Transportation Needs: No Transportation Needs (08/05/2023)   PRAPARE - Administrator, Civil Service (Medical): No    Lack of Transportation (Non-Medical): No  Physical Activity: Inactive (08/05/2023)   Exercise Vital Sign    Days of Exercise per Week: 0 days    Minutes of Exercise per Session: 0 min  Stress: No Stress Concern Present (08/05/2023)   [employer] of Occupational Health - Occupational Stress Questionnaire    Feeling of Stress : Only a little  Social Connections: Moderately Integrated (08/05/2023)   Social Connection and Isolation Panel [NHANES]    Frequency of Communication  with Friends and Family: More than three times a week    Frequency of Social Gatherings with Friends and Family: Twice a week    Attends Religious Services: More than 4 times per year    Active Member of [employer] or Organizations: Yes    Attends Banker Meetings: 1 to 4 times per year    Marital Status: Widowed    Objective:  BP 108/62   Pulse 98   Temp 98.1 F (36.7 C) (Temporal)   Resp 16   Ht 5\' 6"  (1.676 m)   Wt 175 lb (79.4 kg)   LMP  (LMP Unknown) Comment: hysterectomy 2008  SpO2 98%   BMI 28.25 kg/m      02/06/2024   11:14 AM 12/23/2023    9:22 AM 12/13/2023    8:59 AM  BP/Weight  Systolic BP 108 108 108  Diastolic BP 62 66 66  Wt. (Lbs) 175 171.2 177  BMI 28.25 kg/m2 27.63 kg/m2 28.57 kg/m2    Physical Exam Vitals reviewed.  Constitutional:      General: She is not in acute distress.    Appearance: Normal appearance. She is not ill-appearing.  Eyes:     Conjunctiva/sclera: Conjunctivae normal.  Cardiovascular:     Rate and Rhythm: Normal rate and regular rhythm.     Heart sounds: Normal heart sounds. No murmur heard. Pulmonary:     Effort: Pulmonary effort is normal.     Breath sounds: Normal breath sounds. No wheezing.  Musculoskeletal:        General: Normal range of motion.  Skin:    General: Skin is warm.  Neurological:     Mental Status: She is alert. Mental status is at baseline.  Psychiatric:        Mood and Affect: Mood normal.        Behavior: Behavior normal.     Lab Results  Component Value Date   WBC 5.8 11/24/2023   HGB 14.4 11/24/2023   HCT 43.6 11/24/2023   PLT 356 11/24/2023   GLUCOSE 87 11/24/2023   CHOL 152 11/24/2023   TRIG 129 11/24/2023   HDL 56 11/24/2023   LDLCALC 73 11/24/2023   ALT 23 12/08/2023   AST 15 12/08/2023   NA 138 11/24/2023   K 4.8 11/24/2023   CL 101 11/24/2023   CREATININE 0.51 (L) 11/24/2023   BUN 15 11/24/2023   CO2 21 11/24/2023   TSH 0.955 08/05/2023   INR 0.8 01/04/2023   HGBA1C 5.7  (H) 11/24/2023      Assessment & Plan:  Lumbar back pain Assessment & Plan: Chronic back pain managed with hydrocodone  and cyclobenzaprine . - Refill needed on cyclobenzaprine . Continue flexeril  to 10 mg three times a day and gabapentin  once daily before bed.   Orders: -     Cyclobenzaprine   HCl; Take 1 tablet (10 mg total) by mouth 3 (three) times daily.  Dispense: 102 tablet; Refill: 0  Prediabetes Assessment & Plan: Patient on Ozempic , but not experiencing weight loss. - Continue Ozempic  2 mg injection SQ weekly - Refill sent for Ozempic   Orders: -     Semaglutide  (2 MG/DOSE); Inject 2 mg as directed once a week.  Dispense: 3 mL; Refill: 0  Chronic idiopathic constipation Assessment & Plan: Chronic constipation with hard daily bowel movements.  - Continue amitiza  24 mcg one oral twice daily.  - Refill needed for dicyclomine  10 mg for bowel movement regulation.  Orders: -     Dicyclomine  HCl; TAKE 1 CAPSULE(10 MG) BY MOUTH FOUR TIMES DAILY BEFORE MEALS AND AT BEDTIME  Dispense: 120 capsule; Refill: 2  GAD (generalized anxiety disorder) Assessment & Plan: The current medical regimen is effective;  continue present plan and medications.    02/06/2024   11:46 AM 08/05/2023    7:45 AM 04/12/2023    7:47 AM  GAD 7 : Generalized Anxiety Score  Nervous, Anxious, on Edge 2 1 1   Control/stop worrying 2 1 0  Worry too much - different things 2 1 1   Trouble relaxing 2 1 1   Restless 1 1 0  Easily annoyed or irritable 1 1 1   Afraid - awful might happen 1 0 0  Total GAD 7 Score 11 6 4   Anxiety Difficulty Somewhat difficult Not difficult at all Not difficult at all     Taking Alprazolam  0.25 mg daily PRN  Orders: -     ALPRAZolam ; TAKE 1 TABLET(0.25 MG) BY MOUTH DAILY AS NEEDED FOR ANXIETY  Dispense: 30 tablet; Refill: 1  Cigarette nicotine  dependence with nicotine -induced disorder Assessment & Plan: Nicotine  dependence with significant reduction. She quit on April 1st with  occasional relapses. Previously smoked two packs a day. Motivated to quit. - Prescribed Nicoderm patches 21 mg - Encourage continued smoking cessation.  Orders: -     Nicotine ; Place 1 patch (21 mg total) onto the skin daily.  Dispense: 28 patch; Refill: 1       Meds ordered this encounter  Medications   cyclobenzaprine  (FLEXERIL ) 10 MG tablet    Sig: Take 1 tablet (10 mg total) by mouth 3 (three) times daily.    Dispense:  102 tablet    Refill:  0   ALPRAZolam  (XANAX ) 0.25 MG tablet    Sig: TAKE 1 TABLET(0.25 MG) BY MOUTH DAILY AS NEEDED FOR ANXIETY    Dispense:  30 tablet    Refill:  1   dicyclomine  (BENTYL ) 10 MG capsule    Sig: TAKE 1 CAPSULE(10 MG) BY MOUTH FOUR TIMES DAILY BEFORE MEALS AND AT BEDTIME    Dispense:  120 capsule    Refill:  2   Semaglutide , 2 MG/DOSE, 8 MG/3ML SOPN    Sig: Inject 2 mg as directed once a week.    Dispense:  3 mL    Refill:  0   nicotine  (NICODERM CQ  - DOSED IN MG/24 HOURS) 21 mg/24hr patch    Sig: Place 1 patch (21 mg total) onto the skin daily.    Dispense:  28 patch    Refill:  1    No orders of the defined types were placed in this encounter.    Follow-up: Return for keep appointment with Dr. Reinhold Carbine.    An After Visit Summary was printed and given to the patient.  Delford Felling, FNP [facility] [phone]

## 2024-02-06 NOTE — Assessment & Plan Note (Signed)
 The current medical regimen is effective;  continue present plan and medications.    02/06/2024   11:46 AM 08/05/2023    7:45 AM 04/12/2023    7:47 AM  GAD 7 : Generalized Anxiety Score  Nervous, Anxious, on Edge 2 1 1   Control/stop worrying 2 1 0  Worry too much - different things 2 1 1   Trouble relaxing 2 1 1   Restless 1 1 0  Easily annoyed or irritable 1 1 1   Afraid - awful might happen 1 0 0  Total GAD 7 Score 11 6 4   Anxiety Difficulty Somewhat difficult Not difficult at all Not difficult at all     Taking Alprazolam  0.25 mg daily PRN

## 2024-02-06 NOTE — Assessment & Plan Note (Signed)
 Chronic back pain managed with hydrocodone  and cyclobenzaprine . - Refill needed on cyclobenzaprine . Continue flexeril  to 10 mg three times a day and gabapentin  once daily before bed.

## 2024-02-06 NOTE — Assessment & Plan Note (Signed)
 Patient on Ozempic , but not experiencing weight loss. - Continue Ozempic  2 mg injection SQ weekly - Refill sent for Ozempic 

## 2024-02-06 NOTE — Assessment & Plan Note (Signed)
 Chronic constipation with hard daily bowel movements.  - Continue amitiza  24 mcg one oral twice daily.  - Refill needed for dicyclomine  10 mg for bowel movement regulation.

## 2024-02-06 NOTE — Assessment & Plan Note (Signed)
 Nicotine  dependence with significant reduction. She quit on April 1st with occasional relapses. Previously smoked two packs a day. Motivated to quit. - Prescribed Nicoderm patches 21 mg - Encourage continued smoking cessation.

## 2024-02-07 DIAGNOSIS — M25561 Pain in right knee: Secondary | ICD-10-CM | POA: Diagnosis not present

## 2024-02-07 DIAGNOSIS — M1611 Unilateral primary osteoarthritis, right hip: Secondary | ICD-10-CM | POA: Diagnosis not present

## 2024-02-10 ENCOUNTER — Ambulatory Visit (HOSPITAL_BASED_OUTPATIENT_CLINIC_OR_DEPARTMENT_OTHER)
Admission: EM | Admit: 2024-02-10 | Discharge: 2024-02-10 | Disposition: A | Discharge status: Home / Self Care | Attending: Family Medicine | Admitting: Family Medicine

## 2024-02-10 ENCOUNTER — Ambulatory Visit (HOSPITAL_BASED_OUTPATIENT_CLINIC_OR_DEPARTMENT_OTHER): Payer: Self-pay | Admitting: Family Medicine

## 2024-02-10 ENCOUNTER — Ambulatory Visit (HOSPITAL_BASED_OUTPATIENT_CLINIC_OR_DEPARTMENT_OTHER): Admitting: Radiology

## 2024-02-10 ENCOUNTER — Encounter (HOSPITAL_BASED_OUTPATIENT_CLINIC_OR_DEPARTMENT_OTHER): Payer: Self-pay

## 2024-02-10 DIAGNOSIS — M25551 Pain in right hip: Secondary | ICD-10-CM

## 2024-02-10 NOTE — Discharge Instructions (Signed)
 Once your x-ray results come in I will release them to your MyChart.  If anything is concerning I will call you Otherwise follow-up with orthopedic if continued issues

## 2024-02-10 NOTE — ED Triage Notes (Signed)
 Had steroid injection to right hip on Tuesday. States right hip began "popping" right after. Was told to wait 48 hours before being rechecked. States bone scan done a few weeks ago at [facility]. Altho pain is improving, concerned about the popping.
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
## 2024-02-14 NOTE — Telephone Encounter (Signed)
 Mounjaro is not patient medication list, ozempic  is on medication list for patient.    Copied from CRM [id number]. Topic: Clinical - Medication Prior Auth >> Feb 14, 2024  8:41 AM Dorthula Gavel H wrote: Reason for CRM: Pt is wanting to know if a Prior Auth can be sent in for [facility]. Pt would also like a call back about this.

## 2024-02-17 DIAGNOSIS — M19022 Primary osteoarthritis, left elbow: Secondary | ICD-10-CM | POA: Diagnosis not present

## 2024-02-17 DIAGNOSIS — M25532 Pain in left wrist: Secondary | ICD-10-CM | POA: Diagnosis not present

## 2024-02-17 DIAGNOSIS — M19021 Primary osteoarthritis, right elbow: Secondary | ICD-10-CM | POA: Diagnosis not present

## 2024-02-27 ENCOUNTER — Ambulatory Visit: Admitting: Family Medicine
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
## 2024-03-08 DIAGNOSIS — R233 Spontaneous ecchymoses: Secondary | ICD-10-CM | POA: Diagnosis not present

## 2024-03-08 DIAGNOSIS — B3783 Candidal cheilitis: Secondary | ICD-10-CM | POA: Diagnosis not present

## 2024-03-15 ENCOUNTER — Ambulatory Visit: Admitting: Physician Assistant

## 2024-03-15 DIAGNOSIS — M7711 Lateral epicondylitis, right elbow: Secondary | ICD-10-CM

## 2024-03-15 DIAGNOSIS — Z79899 Other long term (current) drug therapy: Secondary | ICD-10-CM

## 2024-03-15 DIAGNOSIS — Z840 Family history of diseases of the skin and subcutaneous tissue: Secondary | ICD-10-CM

## 2024-03-15 DIAGNOSIS — Z96651 Presence of right artificial knee joint: Secondary | ICD-10-CM

## 2024-03-15 DIAGNOSIS — Q6672 Congenital pes cavus, left foot: Secondary | ICD-10-CM

## 2024-03-15 DIAGNOSIS — Z8709 Personal history of other diseases of the respiratory system: Secondary | ICD-10-CM

## 2024-03-15 DIAGNOSIS — I700 Atherosclerosis of aorta: Secondary | ICD-10-CM

## 2024-03-15 DIAGNOSIS — M0600 Rheumatoid arthritis without rheumatoid factor, unspecified site: Secondary | ICD-10-CM

## 2024-03-15 DIAGNOSIS — G43809 Other migraine, not intractable, without status migrainosus: Secondary | ICD-10-CM

## 2024-03-15 DIAGNOSIS — M7061 Trochanteric bursitis, right hip: Secondary | ICD-10-CM

## 2024-03-15 DIAGNOSIS — M19041 Primary osteoarthritis, right hand: Secondary | ICD-10-CM

## 2024-03-15 DIAGNOSIS — M4126 Other idiopathic scoliosis, lumbar region: Secondary | ICD-10-CM

## 2024-03-15 DIAGNOSIS — K219 Gastro-esophageal reflux disease without esophagitis: Secondary | ICD-10-CM

## 2024-03-15 DIAGNOSIS — M51369 Other intervertebral disc degeneration, lumbar region without mention of lumbar back pain or lower extremity pain: Secondary | ICD-10-CM

## 2024-03-15 DIAGNOSIS — M19072 Primary osteoarthritis, left ankle and foot: Secondary | ICD-10-CM

## 2024-03-15 DIAGNOSIS — M7120 Synovial cyst of popliteal space [Baker], unspecified knee: Secondary | ICD-10-CM

## 2024-03-15 DIAGNOSIS — I10 Essential (primary) hypertension: Secondary | ICD-10-CM

## 2024-03-15 DIAGNOSIS — Z720 Tobacco use: Secondary | ICD-10-CM

## 2024-03-15 DIAGNOSIS — F411 Generalized anxiety disorder: Secondary | ICD-10-CM

## 2024-03-15 DIAGNOSIS — R7303 Prediabetes: Secondary | ICD-10-CM

## 2024-03-15 DIAGNOSIS — Z8261 Family history of arthritis: Secondary | ICD-10-CM

## 2024-03-15 DIAGNOSIS — J301 Allergic rhinitis due to pollen: Secondary | ICD-10-CM

## 2024-03-15 DIAGNOSIS — E782 Mixed hyperlipidemia: Secondary | ICD-10-CM

## 2024-03-16 ENCOUNTER — Other Ambulatory Visit: Payer: Self-pay | Admitting: Family Medicine

## 2024-03-16 DIAGNOSIS — M19022 Primary osteoarthritis, left elbow: Secondary | ICD-10-CM | POA: Diagnosis not present

## 2024-03-16 DIAGNOSIS — M25532 Pain in left wrist: Secondary | ICD-10-CM | POA: Diagnosis not present

## 2024-03-16 DIAGNOSIS — M19021 Primary osteoarthritis, right elbow: Secondary | ICD-10-CM | POA: Diagnosis not present

## 2024-03-19 DIAGNOSIS — H538 Other visual disturbances: Secondary | ICD-10-CM | POA: Diagnosis not present

## 2024-03-20 NOTE — Progress Notes (Signed)
 Subjective:  Patient ID: [name], female    DOB: [birth year]  Age: 58 y.o. MRN: [id number]  Chief Complaint  Patient presents with   Medical Management of Chronic Issues    Hypertension    HPI:  Hyperlipidemia/Aortic atherosclerosis:  Intolerant to lipitor and zetia. Taking Repatha  140 mg every 14 days. Aspirin  81 mg daily.   Hypertension: On amlodipine  10 mg daily, furosemide  20 mg as needed (rarely takes).  GERD: Taking omeprazole  40 mg daily, Pepcid  40 mg daily. Heartburn well controlled.  Prediabetes; a1c 5.4. Eating healthy.  On ozempic  2 mg weekly and metformin  500 mg once daily. Patient is eating healthy. Not able to exercise.   Chronic back pain: on gabapentin  300 mg before bed as needed. Cyclobenzaprine  10 mg three times a day. Patient would like to try tizanidine.   Asthma: Albuterol  hfa 2 puffs four times a day as needed shortness of breath. On Advair 2 puffs twice one daily. On singulair .   Tobacco disorder:  Nicoderm patches 21 mcg daily. Smoking 0.5 ppd. Previously, smoked a ppd.   Insomnia: taking magnesium which is helping. Mag 500 mg daily.   Patient is having floaters. Patient has seen an eye doctor.   Depression: on wellbutrin  xl 300 mg daily. Takes xanax  0.25 mg once daily as needed. Uses very infrequently.   RHEUMATOID ARTHRITIS: previously on hydroxychloroquine , but messed with her eyes. On tumeric which has helped. Patient needs to follow up. Seeing Dr. Dolphus.  Her hands, feet, wrists hurt.       03/21/2024    7:50 AM 02/06/2024   11:45 AM 08/05/2023    7:44 AM 04/12/2023    7:47 AM 06/24/2022    2:28 PM  Depression screen PHQ 2/9  Decreased Interest 2 2 1 1  0  Down, Depressed, Hopeless 2 2 1 1  0  PHQ - 2 Score 4 4 2 2  0  Altered sleeping 2 2 2 2    Tired, decreased energy 2 2 1 2    Change in appetite 2 2 1  0   Feeling bad or failure about yourself  2 2 1 1    Trouble concentrating 2 2 1  0   Moving slowly or fidgety/restless 1 0 0 0    Suicidal thoughts 1 0 0 0   PHQ-9 Score 16 14 8 7    Difficult doing work/chores Not difficult at all Not difficult at all Not difficult at all Not difficult at all         03/21/2024    7:50 AM  Fall Risk   Falls in the past year? 0  Number falls in past yr: 0  Injury with Fall? 0  Risk for fall due to : No Fall Risks  Follow up Falls evaluation completed    Patient Care Team: Sherre Clapper, MD as PCP - General (Family Medicine) Isidor Somerset, MD as Referring Physician (Internal Medicine)   Review of Systems  Constitutional:  Negative for chills, diaphoresis, fatigue and fever.  HENT:  Negative for congestion, ear pain and sinus pain.   Eyes: Negative.   Respiratory:  Negative for cough and shortness of breath.   Cardiovascular:  Negative for chest pain.  Gastrointestinal:  Positive for constipation (dicyclomine  helps. just started psyllium). Negative for abdominal pain, diarrhea, nausea and vomiting.  Endocrine: Negative.   Genitourinary:  Negative for dysuria, frequency and urgency.  Musculoskeletal:  Negative for arthralgias.  Skin: Negative.   Allergic/Immunologic: Negative.   Neurological:  Negative for weakness and headaches.  Hematological: Negative.   Psychiatric/Behavioral:  Negative for dysphoric mood. The patient is not nervous/anxious.     Current Outpatient Medications on File Prior to Visit  Medication Sig Dispense Refill   acetaminophen  (TYLENOL ) 650 MG CR tablet Take 650-1,300 mg by mouth every 8 (eight) hours as needed for pain.     ADVAIR HFA 115-21 MCG/ACT inhaler INHALE 2 PUFFS INTO THE LUNGS TWICE DAILY 36 g 0   albuterol  (VENTOLIN  HFA) 108 (90 Base) MCG/ACT inhaler INHALE 2 PUFFS INTO LUNGS EVERY 6 TO 8 HOURS AS NEEDED FOR WHEEZING 18 g 1   ALPRAZolam  (XANAX ) 0.25 MG tablet TAKE 1 TABLET(0.25 MG) BY MOUTH DAILY AS NEEDED FOR ANXIETY 30 tablet 1   amLODipine  (NORVASC ) 10 MG tablet TAKE 1 TABLET BY MOUTH DAILY 90 tablet 0   ammonium lactate  (LAC-HYDRIN) 12 % lotion Apply 1 Application topically as needed for dry skin.     aspirin  EC 81 MG tablet Take 1 tablet (81 mg total) by mouth daily. Swallow whole.     buPROPion  (WELLBUTRIN  XL) 300 MG 24 hr tablet TAKE 1 TABLET(300 MG) BY MOUTH DAILY. 90 tablet 1   chlorhexidine  (PERIDEX ) 0.12 % solution Use as directed 5 mLs in the mouth or throat 2 (two) times daily.     Cholecalciferol (VITAMIN D -3 PO) Take by mouth.     cyclobenzaprine  (FLEXERIL ) 10 MG tablet Take 1 tablet (10 mg total) by mouth 3 (three) times daily. 102 tablet 0   dicyclomine  (BENTYL ) 10 MG capsule TAKE 1 CAPSULE(10 MG) BY MOUTH FOUR TIMES DAILY BEFORE MEALS AND AT BEDTIME 120 capsule 2   Docusate Sodium  (DSS) 100 MG CAPS Take 100 mg by mouth daily as needed (constipation).     famotidine  (PEPCID ) 40 MG tablet Take 1 tablet (40 mg total) by mouth daily as needed for heartburn or indigestion. 90 tablet 1   fluticasone  (FLONASE ) 50 MCG/ACT nasal spray SHAKE LIQUID AND USE 2 SPRAYS IN EACH NOSTRIL DAILY 16 g 6   furosemide  (LASIX ) 20 MG tablet Take 1 tablet (20 mg total) by mouth daily as needed for edema. 30 tablet 2   gabapentin  (NEURONTIN ) 300 MG capsule Take 300 mg by mouth 3 (three) times daily. (Patient taking differently: Take 300 mg by mouth 3 (three) times daily as needed (Patient only uses when needed).)     guaiFENesin  (MUCINEX ) 600 MG 12 hr tablet Take 1 tablet (600 mg total) by mouth 2 (two) times daily. (Patient taking differently: Take 600 mg by mouth as needed.) 60 tablet 1   HYDROcodone -acetaminophen  (NORCO/VICODIN) 5-325 MG tablet Take 1-2 tablets by mouth every 6 (six) hours as needed.     ipratropium-albuterol  (DUONEB) 0.5-2.5 (3) MG/3ML SOLN Take 3 mLs by nebulization every 6 (six) hours as needed (as needed).     ketoconazole (NIZORAL) 2 % cream Apply 1 Application topically 2 (two) times daily.     lubiprostone  (AMITIZA ) 24 MCG capsule Take 1 capsule (24 mcg total) by mouth 2 (two) times daily with a meal.  60 capsule 5   MAGNESIUM PO Take 500 mg by mouth daily.     meloxicam (MOBIC) 15 MG tablet Take 15 mg by mouth daily.     metFORMIN  (GLUCOPHAGE ) 500 MG tablet Take 1 tablet (500 mg total) by mouth 2 (two) times daily with a meal. 180 tablet 3   montelukast  (SINGULAIR ) 10 MG tablet Take 1 tablet (10 mg total) by mouth at bedtime. 90 tablet 0   Multiple Vitamins-Minerals (MULTIVITAMIN WITH  MINERALS) tablet Take 1 tablet by mouth daily.     nicotine  (NICODERM CQ  - DOSED IN MG/24 HOURS) 21 mg/24hr patch Place 1 patch (21 mg total) onto the skin daily. 28 patch 1   nitroGLYCERIN  (NITROSTAT ) 0.4 MG SL tablet Place 1 tablet (0.4 mg total) under the tongue every 5 (five) minutes as needed for chest pain. 50 tablet 3   omeprazole  (PRILOSEC) 40 MG capsule TAKE 1 CAPSULE(40 MG) BY MOUTH DAILY 90 capsule 3   REPATHA  SURECLICK 140 MG/ML SOAJ ADMINISTER 1 ML(140MG ) UNDER THE SKIN EVERY 14 DAYS AS DIRECTED 2 mL 2   triamcinolone  (KENALOG ) 0.025 % ointment Apply 1 Application topically 2 (two) times daily. Apply to corners of mouth twice daily (Patient taking differently: Apply 1 Application topically 2 (two) times daily as needed. Apply to corners of mouth twice daily) 30 g 0   TURMERIC PO Take 1,000 mg by mouth daily. With ginger     VITAMIN E PO Take by mouth.     azelastine  (ASTELIN ) 0.1 % nasal spray Place 2 sprays into both nostrils 2 (two) times daily. Use in each nostril as directed (Patient not taking: Reported on 03/21/2024) 30 mL 12   No current facility-administered medications on file prior to visit.   Past Medical History:  Diagnosis Date   Allergic rhinitis    Anginal pain (HCC)    Arthritis    Blood transfusion without reported diagnosis    Cellulitis, face 06/08/2022   Complication of anesthesia    COPD (chronic obstructive pulmonary disease) (HCC)    Dental abscess 06/09/2022   GERD (gastroesophageal reflux disease)    History of colon polyps    History of kidney stones    HTN  (hypertension)    Hyperlipidemia    Kidney stones    Pancreatitis    Pneumonia    PONV (postoperative nausea and vomiting)    Primary hypertension 10/20/2021   Psychotic depression (HCC)    Rheumatoid arthritis (HCC) 2024   Shingles February 26th, 2001   Tendinitis    both arms   Past Surgical History:  Procedure Laterality Date   ABDOMINAL HYSTERECTOMY     BACK SURGERY  02/2023   CARDIAC CATHETERIZATION  2008   CESAREAN SECTION     CHOLECYSTECTOMY     COLONOSCOPY  05/02/2017   Colonic polyp status post polypectomy. Small internal hemorrhoids   FRACTURE SURGERY Bilateral    [facility]   REPLACEMENT TOTAL KNEE Right 02/09/2019   TUBAL LIGATION      Family History  Problem Relation Age of Onset   Arthritis Mother    Lung disease Mother    Parkinson's disease Mother    COPD Father    Arthritis Father    Healthy Sister    Breast cancer Maternal Aunt        great aunt   Rheum arthritis Maternal Uncle    Rheum arthritis Maternal Grandfather    Healthy Son    High Cholesterol Son    Healthy Son    Colon cancer Cousin        mother's cousin   Diabetes Other    Stroke Other    Hypertension Other    Hyperlipidemia Other    Asthma Other    Heart failure Other    Thyroid  disease Other    Heart attack Other    COPD Other    Arrhythmia Other    Arthritis Other    Migraines Other    Social History  Socioeconomic History   Marital status: Widowed    Spouse name: Not on file   Number of children: 2   Years of education: Not on file   Highest education level: GED or equivalent  Occupational History   Not on file  Tobacco Use   Smoking status: Every Day    Current packs/day: 1.00    Types: Cigarettes    Passive exposure: Past   Smokeless tobacco: Never  Vaping Use   Vaping status: Never Used  Substance and Sexual Activity   Alcohol use: Yes    Comment: rarely   Drug use: No   Sexual activity: Not Currently  Other Topics Concern   Not on file  Social History  Narrative   Not on file   Social Drivers of Health   Financial Resource Strain: Medium Risk (08/05/2023)   Overall Financial Resource Strain (CARDIA)    Difficulty of Paying Living Expenses: Somewhat hard  Food Insecurity: Food Insecurity Present (08/05/2023)   Hunger Vital Sign    Worried About Running Out of Food in the Last Year: Often true    Ran Out of Food in the Last Year: Sometimes true  Transportation Needs: No Transportation Needs (08/05/2023)   PRAPARE - Administrator, Civil Service (Medical): No    Lack of Transportation (Non-Medical): No  Physical Activity: Inactive (08/05/2023)   Exercise Vital Sign    Days of Exercise per Week: 0 days    Minutes of Exercise per Session: 0 min  Stress: No Stress Concern Present (08/05/2023)   [employer] of Occupational Health - Occupational Stress Questionnaire    Feeling of Stress : Only a little  Social Connections: Moderately Integrated (08/05/2023)   Social Connection and Isolation Panel    Frequency of Communication with Friends and Family: More than three times a week    Frequency of Social Gatherings with Friends and Family: Twice a week    Attends Religious Services: More than 4 times per year    Active Member of [employer] or Organizations: Yes    Attends Banker Meetings: 1 to 4 times per year    Marital Status: Widowed    Objective:  BP 110/66   Pulse 85   Temp 98 F (36.7 C) (Temporal)   Resp 16   Ht 5' 6 (1.676 m)   Wt 175 lb 9.6 oz (79.7 kg)   LMP  (LMP Unknown) Comment: hysterectomy 2008  SpO2 96%   BMI 28.34 kg/m      03/21/2024    7:41 AM 02/10/2024    1:59 PM 02/06/2024   11:14 AM  BP/Weight  Systolic BP 110 133 108  Diastolic BP 66 81 62  Wt. (Lbs) 175.6  175  BMI 28.34 kg/m2  28.25 kg/m2    Physical Exam Vitals reviewed.  Constitutional:      Appearance: Normal appearance. She is obese.  Neck:     Vascular: No carotid bruit.  Cardiovascular:     Rate and Rhythm:  Normal rate and regular rhythm.     Heart sounds: Normal heart sounds.  Pulmonary:     Effort: Pulmonary effort is normal. No respiratory distress.     Breath sounds: Normal breath sounds.  Abdominal:     General: Abdomen is flat. Bowel sounds are normal.     Palpations: Abdomen is soft.     Tenderness: There is no abdominal tenderness.  Neurological:     Mental Status: She is alert and oriented to  person, place, and time.  Psychiatric:        Mood and Affect: Mood normal.        Behavior: Behavior normal.         Lab Results  Component Value Date   WBC 5.8 11/24/2023   HGB 14.4 11/24/2023   HCT 43.6 11/24/2023   PLT 356 11/24/2023   GLUCOSE 87 11/24/2023   CHOL 152 11/24/2023   TRIG 129 11/24/2023   HDL 56 11/24/2023   LDLCALC 73 11/24/2023   ALT 23 12/08/2023   AST 15 12/08/2023   NA 138 11/24/2023   K 4.8 11/24/2023   CL 101 11/24/2023   CREATININE 0.51 (L) 11/24/2023   BUN 15 11/24/2023   CO2 21 11/24/2023   TSH 0.955 08/05/2023   INR 0.8 01/04/2023   HGBA1C 5.7 (H) 11/24/2023      Assessment & Plan:  Primary hypertension Assessment & Plan: Well controlled.  No changes to medicines. Amlodipine  10 mg daily, furosemide  20 mg as needed    Mixed hyperlipidemia Assessment & Plan: Well controlled.  No changes to medicines.  Intolerant to lipitor and zetia. Taking Repatha  140 mg every 14 days. Continue to work on eating a healthy diet and exercise.  Labs reviewed. Recommend try website: http://www.harvey.com/   Gastroesophageal reflux disease without esophagitis Assessment & Plan: Well controlled.  Continue omeprazole  40 mg daily.  Continue pepcid  40 mg daily as needed.   Simple chronic bronchitis (HCC) Assessment & Plan: Improved with singulair  use.  Managed with Advair and albuterol .   Prediabetes Assessment & Plan: Stable.  - Continue Ozempic  2 mg injection SQ weekly   Orders: -     Semaglutide  (2 MG/DOSE); Inject 2 mg as directed once a  week.  Dispense: 9 mL; Refill: 1  Myalgia due to statin Assessment & Plan: Intolerance of statins.  Cigarette nicotine  dependence with nicotine -induced disorder Assessment & Plan: Refuses tobacco use.    Rheumatoid arthritis involving both hands with negative rheumatoid factor (HCC) Assessment & Plan: Strongly recommended return to rheumatologist.     Meds ordered this encounter  Medications   Semaglutide , 2 MG/DOSE, 8 MG/3ML SOPN    Sig: Inject 2 mg as directed once a week.    Dispense:  9 mL    Refill:  1    No orders of the defined types were placed in this encounter.    Follow-up: Return in about 3 months (around 06/21/2024) for chronic follow up.   I,Marla I Leal-Borjas,acting as a scribe for Abigail Free, MD.,have documented all relevant documentation on the behalf of Abigail Free, MD,as directed by  Abigail Free, MD while in the presence of Abigail Free, MD.   An After Visit Summary was printed and given to the patient.  I attest that I have reviewed this visit and agree with the plan scribed by my staff.   Abigail Free, MD [name] [phone]

## 2024-03-21 ENCOUNTER — Ambulatory Visit: Admitting: Physician Assistant

## 2024-03-21 ENCOUNTER — Ambulatory Visit: Admitting: Family Medicine

## 2024-03-21 ENCOUNTER — Encounter: Payer: Self-pay | Admitting: Family Medicine

## 2024-03-21 ENCOUNTER — Other Ambulatory Visit: Payer: Self-pay | Admitting: Family Medicine

## 2024-03-21 VITALS — BP 110/66 | HR 85 | Temp 98.0°F | Resp 16 | Ht 66.0 in | Wt 175.6 lb

## 2024-03-21 DIAGNOSIS — M7711 Lateral epicondylitis, right elbow: Secondary | ICD-10-CM

## 2024-03-21 DIAGNOSIS — Z840 Family history of diseases of the skin and subcutaneous tissue: Secondary | ICD-10-CM

## 2024-03-21 DIAGNOSIS — F17219 Nicotine dependence, cigarettes, with unspecified nicotine-induced disorders: Secondary | ICD-10-CM

## 2024-03-21 DIAGNOSIS — J410 Simple chronic bronchitis: Secondary | ICD-10-CM

## 2024-03-21 DIAGNOSIS — I700 Atherosclerosis of aorta: Secondary | ICD-10-CM

## 2024-03-21 DIAGNOSIS — Q6672 Congenital pes cavus, left foot: Secondary | ICD-10-CM

## 2024-03-21 DIAGNOSIS — Z79899 Other long term (current) drug therapy: Secondary | ICD-10-CM

## 2024-03-21 DIAGNOSIS — M4126 Other idiopathic scoliosis, lumbar region: Secondary | ICD-10-CM

## 2024-03-21 DIAGNOSIS — J301 Allergic rhinitis due to pollen: Secondary | ICD-10-CM

## 2024-03-21 DIAGNOSIS — M51369 Other intervertebral disc degeneration, lumbar region without mention of lumbar back pain or lower extremity pain: Secondary | ICD-10-CM

## 2024-03-21 DIAGNOSIS — T466X5A Adverse effect of antihyperlipidemic and antiarteriosclerotic drugs, initial encounter: Secondary | ICD-10-CM

## 2024-03-21 DIAGNOSIS — M791 Myalgia, unspecified site: Secondary | ICD-10-CM

## 2024-03-21 DIAGNOSIS — M06041 Rheumatoid arthritis without rheumatoid factor, right hand: Secondary | ICD-10-CM | POA: Diagnosis not present

## 2024-03-21 DIAGNOSIS — M19071 Primary osteoarthritis, right ankle and foot: Secondary | ICD-10-CM

## 2024-03-21 DIAGNOSIS — E782 Mixed hyperlipidemia: Secondary | ICD-10-CM | POA: Diagnosis not present

## 2024-03-21 DIAGNOSIS — M0600 Rheumatoid arthritis without rheumatoid factor, unspecified site: Secondary | ICD-10-CM

## 2024-03-21 DIAGNOSIS — K219 Gastro-esophageal reflux disease without esophagitis: Secondary | ICD-10-CM | POA: Diagnosis not present

## 2024-03-21 DIAGNOSIS — I10 Essential (primary) hypertension: Secondary | ICD-10-CM | POA: Diagnosis not present

## 2024-03-21 DIAGNOSIS — M19041 Primary osteoarthritis, right hand: Secondary | ICD-10-CM

## 2024-03-21 DIAGNOSIS — F411 Generalized anxiety disorder: Secondary | ICD-10-CM

## 2024-03-21 DIAGNOSIS — Z8709 Personal history of other diseases of the respiratory system: Secondary | ICD-10-CM

## 2024-03-21 DIAGNOSIS — M06042 Rheumatoid arthritis without rheumatoid factor, left hand: Secondary | ICD-10-CM

## 2024-03-21 DIAGNOSIS — G43809 Other migraine, not intractable, without status migrainosus: Secondary | ICD-10-CM

## 2024-03-21 DIAGNOSIS — Z96651 Presence of right artificial knee joint: Secondary | ICD-10-CM

## 2024-03-21 DIAGNOSIS — Z8261 Family history of arthritis: Secondary | ICD-10-CM

## 2024-03-21 DIAGNOSIS — M7120 Synovial cyst of popliteal space [Baker], unspecified knee: Secondary | ICD-10-CM

## 2024-03-21 DIAGNOSIS — R7303 Prediabetes: Secondary | ICD-10-CM | POA: Diagnosis not present

## 2024-03-21 DIAGNOSIS — R7989 Other specified abnormal findings of blood chemistry: Secondary | ICD-10-CM

## 2024-03-21 DIAGNOSIS — M7062 Trochanteric bursitis, left hip: Secondary | ICD-10-CM

## 2024-03-21 DIAGNOSIS — Z720 Tobacco use: Secondary | ICD-10-CM

## 2024-03-21 MED ORDER — SEMAGLUTIDE (2 MG/DOSE) 8 MG/3ML ~~LOC~~ SOPN
2.0000 mg | PEN_INJECTOR | SUBCUTANEOUS | 1 refills | Status: DC
Start: 2024-03-21 — End: 2024-08-14

## 2024-03-21 NOTE — Patient Instructions (Addendum)
 Recommend try website: www.myplate.gov

## 2024-03-24 ENCOUNTER — Encounter: Payer: Self-pay | Admitting: Family Medicine

## 2024-03-24 NOTE — Assessment & Plan Note (Signed)
 Well controlled.  No changes to medicines.  Intolerant to lipitor and zetia. Taking Repatha  140 mg every 14 days. Continue to work on eating a healthy diet and exercise.  Labs reviewed. Recommend try website: www.myplate.gov

## 2024-03-24 NOTE — Assessment & Plan Note (Signed)
 Strongly recommended return to rheumatologist.

## 2024-03-24 NOTE — Assessment & Plan Note (Signed)
 Intolerance of statins.

## 2024-03-24 NOTE — Assessment & Plan Note (Signed)
 Improved with singulair  use.  Managed with Advair and albuterol .

## 2024-03-24 NOTE — Assessment & Plan Note (Signed)
 Well controlled.  No changes to medicines. Amlodipine  10 mg daily, furosemide  20 mg as needed

## 2024-03-24 NOTE — Assessment & Plan Note (Signed)
 Stable.  - Continue Ozempic  2 mg injection SQ weekly

## 2024-03-24 NOTE — Assessment & Plan Note (Signed)
 Refuses tobacco use.

## 2024-03-24 NOTE — Assessment & Plan Note (Signed)
 Well controlled.  Continue omeprazole  40 mg daily.  Continue pepcid  40 mg daily as needed.

## 2024-03-26 ENCOUNTER — Other Ambulatory Visit: Payer: Self-pay

## 2024-03-26 DIAGNOSIS — Z90710 Acquired absence of both cervix and uterus: Secondary | ICD-10-CM

## 2024-03-28 NOTE — Progress Notes (Unsigned)
 Office Visit Note  Patient: [name]             Date of Birth: 1966-03-21           MRN: [id number]             PCP: Sherre Clapper, MD Referring: Sherre Clapper, MD Visit Date: 04/11/2024 Occupation: @GUAROCC @  Subjective:  Pain in multiple joints  History of Present Illness: [name] is a 58 y.o. female with history of seronegative rheumatoid arthritis and osteoarthritis.  Patient is not currently taking any medications for management of rheumatoid arthritis.  She previously discontinued Plaquenil  due to the concern for Plaquenil  eye toxicity.  Patient presents today with increased pain involving multiple joints.  She is having increased discomfort in both elbows, both wrists, both hands, and both feet especially the right ankle.  She has been taking Tylenol  and Norco for pain relief.  Patient states that she is prescribed Norco for her lower back pain.  Patient states that she has noticed some swelling in her elbows but states that the pain in her hands has been constant.  She would like to discuss resuming Plaquenil  or initiating another medication for management of rheumatoid arthritis.  Activities of Daily Living:  Patient reports joint stiffness all day  Patient Reports nocturnal pain.  Difficulty dressing/grooming: Denies Difficulty climbing stairs: Reports Difficulty getting out of chair: Reports Difficulty using hands for taps, buttons, cutlery, and/or writing: Reports  Review of Systems  Constitutional:  Positive for fatigue.  HENT:  Negative for mouth sores, mouth dryness and nose dryness.   Eyes:  Negative for pain, visual disturbance and dryness.  Respiratory:  Negative for cough, hemoptysis, shortness of breath and difficulty breathing.   Cardiovascular:  Negative for chest pain, palpitations, hypertension and swelling in legs/feet.  Gastrointestinal:  Positive for constipation. Negative for blood in stool and diarrhea.  Endocrine: Negative for increased  urination.  Genitourinary:  Negative for painful urination.  Musculoskeletal:  Positive for joint pain, joint pain and morning stiffness. Negative for joint swelling, myalgias, muscle weakness, muscle tenderness and myalgias.  Skin:  Negative for color change, pallor, rash, hair loss, nodules/bumps, skin tightness, ulcers and sensitivity to sunlight.  Allergic/Immunologic: Negative for susceptible to infections.  Neurological:  Negative for dizziness, numbness, headaches and weakness.  Hematological:  Positive for bruising/bleeding tendency. Negative for swollen glands.  Psychiatric/Behavioral:  Positive for depressed mood and sleep disturbance. The patient is nervous/anxious.     PMFS History:  Patient Active Problem List   Diagnosis Date Noted   Arthropathy of lumbar facet joint 02/06/2024   Numbness and tingling sensation of skin 02/06/2024   COPD exacerbation (HCC) 12/23/2023   Sinus headache 12/14/2023   Chronic idiopathic constipation 12/13/2023   Right sided abdominal pain 12/13/2023   Irritable bowel syndrome with constipation 12/13/2023   Numbness of lower limb 12/06/2023   Rheumatoid arthritis (HCC) 11/27/2023   Chronic allergic rhinitis 11/26/2023   Elevated liver enzymes 10/25/2023   Acute right flank pain 10/25/2023   Frequency of urination 10/25/2023   Wheezing 10/05/2023   Opacities of both lungs present on chest x-ray 10/05/2023   Non-seasonal allergic rhinitis due to pollen 09/08/2023   Bilateral elbow joint pain 08/28/2023   Tinnitus of left ear 08/17/2023   Osteoarthritis of right elbow 07/14/2023   Pain in joint of right shoulder 07/14/2023   Seasonal allergic rhinitis due to pollen 06/16/2023   Left wrist pain 06/02/2023   Degenerative scoliosis in adult  patient 02/28/2023   Aspirin -like platelet function defect (HCC) 01/13/2023   Dental caries 01/04/2023   History of blood transfusion 01/04/2023   Failure of dental implant due to infection 01/04/2023    Lumbar back pain 09/14/2022   Aortic atherosclerosis (HCC) 05/08/2022   Chronic midline low back pain without sciatica 01/22/2022   Myalgia due to statin 01/22/2022   GERD (gastroesophageal reflux disease) 01/22/2022   Overweight with body mass index (BMI) of 28 to 28.9 in adult 12/31/2021   Prediabetes 10/20/2021   Migraine 10/20/2021   Primary hypertension 10/20/2021   DDD (degenerative disc disease), lumbar 06/17/2020   Neuropathy 04/08/2020   Simple chronic bronchitis (HCC) 01/06/2020   Cigarette nicotine  dependence with nicotine -induced disorder 01/01/2020   GAD (generalized anxiety disorder) 01/01/2020   Mixed hyperlipidemia 06/05/2019   Smoking 06/05/2019   Prinzmetal angina (HCC) 06/05/2019   Tobacco abuse 06/05/2019   Osteoarthritis of left knee 02/09/2019   Pain of left hip joint 12/19/2018   Unilateral primary osteoarthritis of first carpometacarpal joint, right hand 11/20/2018   Right leg pain 01/29/2013   Chest pain 08/31/2011    Past Medical History:  Diagnosis Date   Allergic rhinitis    Anginal pain (HCC)    Arthritis    Blood transfusion without reported diagnosis    Cellulitis, face 06/08/2022   Complication of anesthesia    COPD (chronic obstructive pulmonary disease) (HCC)    Dental abscess 06/09/2022   GERD (gastroesophageal reflux disease)    History of colon polyps    History of kidney stones    HTN (hypertension)    Hyperlipidemia    Kidney stones    Pancreatitis    Pneumonia    PONV (postoperative nausea and vomiting)    Primary hypertension 10/20/2021   Psychotic depression (HCC)    Rheumatoid arthritis (HCC) 2024   Shingles February 26th, 2001   Tendinitis    both arms    Family History  Problem Relation Age of Onset   Arthritis Mother    Lung disease Mother    Parkinson's disease Mother    COPD Father    Arthritis Father    Healthy Sister    Breast cancer Maternal Aunt        great aunt   Rheum arthritis Maternal Uncle    Rheum  arthritis Maternal Grandfather    Healthy Son    High Cholesterol Son    Healthy Son    Colon cancer Cousin        mother's cousin   Diabetes Other    Stroke Other    Hypertension Other    Hyperlipidemia Other    Asthma Other    Heart failure Other    Thyroid  disease Other    Heart attack Other    COPD Other    Arrhythmia Other    Arthritis Other    Migraines Other    Past Surgical History:  Procedure Laterality Date   ABDOMINAL HYSTERECTOMY     BACK SURGERY  02/2023   CARDIAC CATHETERIZATION  2008   CESAREAN SECTION     CHOLECYSTECTOMY     COLONOSCOPY  05/02/2017   Colonic polyp status post polypectomy. Small internal hemorrhoids   FRACTURE SURGERY Bilateral    femur   REPLACEMENT TOTAL KNEE Right 02/09/2019   TUBAL LIGATION     Social History   Social History Narrative   Not on file   Immunization History  Administered Date(s) Administered   Influenza,inj,Quad PF,6+ Mos 06/27/2018  Influenza-Unspecified 07/08/2017, 08/09/2022   PNEUMOCOCCAL CONJUGATE-20 05/06/2022   Tdap 12/01/2021   Zoster Recombinant(Shingrix) 09/14/2017, 05/05/2018     Objective: Vital Signs: BP 125/70 (BP Location: Left Arm, Patient Position: Sitting, Cuff Size: Normal)   Pulse 78   Resp 15   Ht 5' 6 (1.676 m)   Wt 175 lb (79.4 kg)   LMP  (LMP Unknown) Comment: hysterectomy 2008  BMI 28.25 kg/m    Physical Exam Vitals and nursing note reviewed.  Constitutional:      Appearance: She is well-developed.  HENT:     Head: Normocephalic and atraumatic.  Eyes:     Conjunctiva/sclera: Conjunctivae normal.  Cardiovascular:     Rate and Rhythm: Normal rate and regular rhythm.     Heart sounds: Normal heart sounds.  Pulmonary:     Effort: Pulmonary effort is normal.     Breath sounds: Normal breath sounds.  Abdominal:     General: Bowel sounds are normal.     Palpations: Abdomen is soft.  Musculoskeletal:     Cervical back: Normal range of motion.  Lymphadenopathy:      Cervical: No cervical adenopathy.  Skin:    General: Skin is warm and dry.     Capillary Refill: Capillary refill takes less than 2 seconds.  Neurological:     Mental Status: She is alert and oriented to person, place, and time.  Psychiatric:        Behavior: Behavior normal.      Musculoskeletal Exam: C-spine has good range of motion.  Limited mobility of the lumbar spine.  Shoulder joints have good range of motion with no discomfort.  Tenderness over the lateral condyle of both elbows.  Tenderness over both wrist joints, especially the left wrist.  PIP and DIP thickening consistent with osteoarthritis of both hands.  Tenderness of the right 2nd and 3rd MCP and PIP joints.  Hip joints have good range of motion with no groin pain.  Right knee replacement has good range of motion.  Left knee joint has good range of motion no warmth or effusion.  Ankle joints have good range of motion with tenderness of the right ankle.  CDAI Exam: CDAI Score: -- Patient Global: --; Provider Global: -- Swollen: --; Tender: -- Joint Exam 04/11/2024   No joint exam has been documented for this visit   There is currently no information documented on the homunculus. Go to the Rheumatology activity and complete the homunculus joint exam.  Investigation: No additional findings.  Imaging: DG Chest 2 View Result Date: 04/03/2024 CLINICAL DATA:  chest pain EXAM: CHEST - 2 VIEW COMPARISON:  December 12, 2023 FINDINGS: No focal airspace consolidation, pleural effusion, or pneumothorax. No cardiomegaly. No acute fracture or destructive lesion. Multilevel thoracic osteophytosis. Partially visualized lumbar fusion hardware. Cholecystectomy clips. IMPRESSION: No acute cardiopulmonary abnormality. Electronically Signed   By: Rogelia Myers M.D.   On: 04/03/2024 12:29     Recent Labs: Lab Results  Component Value Date   WBC 7.0 04/03/2024   HGB 13.8 04/03/2024   PLT 275 04/03/2024   NA 138 04/03/2024   K 4.1  04/03/2024   CL 101 04/03/2024   CO2 27 04/03/2024   GLUCOSE 89 04/03/2024   BUN 7 04/03/2024   CREATININE 0.46 04/03/2024   BILITOT 0.2 12/08/2023   ALKPHOS 104 11/24/2023   AST 15 12/08/2023   ALT 23 12/08/2023   PROT 6.6 12/08/2023   ALBUMIN 4.5 11/24/2023   CALCIUM  9.7 04/03/2024   GFRAA  122 10/28/2020    Speciality Comments: PLQ Eye Exam: 09/28/2023 WNL @ [facility] follow up in 1 year.   Procedures:  No procedures performed Allergies: Levofloxacin, Bactrim  ds [sulfamethoxazole -trimethoprim ], Lipitor [atorvastatin ], Plaquenil  [hydroxychloroquine ], Tape, Zetia [ezetimibe], Codeine, and Other    Assessment / Plan:     Visit Diagnoses: Seronegative rheumatoid arthritis (HCC) - Anti-CCP negative, RF negative, ESR within normal limits on 07/12/2023, ultrasound of both hands positive for synovitis on 08/04/2023: Patient presents today with increased pain involving multiple joints.  She has had a recurrence of lateral epicondylitis of both elbows, tenderness of both wrist joints, right 2nd and 3rd MCP joints, and the right ankle.  She has been taking meloxicam, Norco and Tylenol  as needed for pain relief.  Patient was previously taking Plaquenil  200 mg 1 tablet by mouth twice daily Monday through Friday.  Patient states that she has several interruptions in therapy and discontinued in March 2025 due to concern for Plaquenil  toxicity.  Due to the severity and chronicity of pain she would like to try resuming Plaquenil  as prescribed.  Reviewed indications, contraindications, potential side effects of Plaquenil  today in detail.  All questions were addressed.  Reviewed CBC and BMP results from 04/03/2024.  Plan to check hepatic function panel today.  A prescription for Plaquenil  be sent to the pharmacy tomorrow pending lab results.  She will notify us  if she cannot tolerate taking Plaquenil .  She will follow-up in the office in 3 months or sooner if needed.  High risk medication use -  Plan to resume plaquenil  200 mg 1 tablet by mouth twice daily Monday through Friday.   Discontinued Plaquenil  on 11/28/23 due to concern for plaquenil  eye toxicity.  Previous plaquenil  dose: 200 mg 1 tablet by mouth twice daily Monday through Friday.  CBC and BMP updated on 04/03/24. Hepatic function panel updated today.  PLQ Eye Exam: 09/28/2023 WNL @ [facility] follow up in 1 year.  - Plan: Hepatic function panel  Elevated LFTs -Plan to recheck hepatic function panel today.  Plan: Hepatic function panel  Primary osteoarthritis of both hands: Patient presents today with ongoing pain and stiffness involving both hands.  She has tenderness over the right 2nd and 3rd MCP and PIP joints.  She also has tenderness of both wrist joints especially the left wrist.  Plan to resume Plaquenil  as discussed above.  Lateral epicondylitis of both elbows: Recurrent.  Tenderness over the lateral epicondyle of both elbows noted today.    Synovial cyst of popliteal space, unspecified laterality  Status post total knee replacement, right - 2020 right total knee replacement by Dr. Georgina.  No effusion noted.   Pes cavus of both feet- Chronic pain in both feet.   Primary osteoarthritis of both feet: She presents today with increased pain in the right ankle.  No synovitis noted.  She has been taking meloxicam 15 mg daily, Norco as needed, and Tylenol  as needed for pain relief.  Degeneration of intervertebral disc of lumbar region without discogenic back pain or lower extremity pain - S/p laminectomy September 20, 2022.PLIF L3-L4, L4-L5 by Dr. Mavis February 28, 2023. Chronic pain.  She takes Norco for pain relief.    Trochanteric bursitis of both hips: Intermittent discomfort.    Other idiopathic scoliosis, lumbar region: Chronic pain.  She takes Norco as needed for pain relief.  Other medical conditions are listed as follows:  Primary hypertension: Blood pressure was 125/70 today in the office.  History  of COPD  Aortic atherosclerosis (HCC)  Mixed hyperlipidemia  Prediabetes  Tobacco abuse  Gastroesophageal reflux disease without esophagitis  GAD (generalized anxiety disorder)  Other migraine without status migrainosus, not intractable  Seasonal allergic rhinitis due to pollen  Family history of rheumatoid arthritis-maternal uncle and maternal grandfather  Family history of psoriasis-son and granddaughter    Orders: Orders Placed This Encounter  Procedures   Hepatic function panel   No orders of the defined types were placed in this encounter.   Follow-Up Instructions: Return in about 3 months (around 07/12/2024) for Rheumatoid arthritis.   Waddell CHRISTELLA Craze, PA-C  Note - This record has been created using Dragon software.  Chart creation errors have been sought, but may not always  have been located. Such creation errors do not reflect on  the standard of medical care.

## 2024-03-29 ENCOUNTER — Encounter: Payer: Self-pay | Admitting: Family Medicine

## 2024-03-29 ENCOUNTER — Other Ambulatory Visit: Payer: Self-pay | Admitting: Family Medicine

## 2024-04-03 ENCOUNTER — Ambulatory Visit: Payer: Self-pay

## 2024-04-03 ENCOUNTER — Emergency Department (HOSPITAL_COMMUNITY): Admission: EM | Admit: 2024-04-03 | Discharge: 2024-04-03 | Disposition: A | Discharge status: Home / Self Care

## 2024-04-03 ENCOUNTER — Other Ambulatory Visit: Payer: Self-pay

## 2024-04-03 ENCOUNTER — Encounter (HOSPITAL_COMMUNITY): Payer: Self-pay | Admitting: Pharmacy Technician

## 2024-04-03 ENCOUNTER — Emergency Department (HOSPITAL_COMMUNITY)

## 2024-04-03 ENCOUNTER — Telehealth: Payer: Self-pay | Admitting: Gastroenterology

## 2024-04-03 DIAGNOSIS — Z556 Problems related to health literacy: Secondary | ICD-10-CM | POA: Diagnosis not present

## 2024-04-03 DIAGNOSIS — Z7982 Long term (current) use of aspirin: Secondary | ICD-10-CM | POA: Diagnosis not present

## 2024-04-03 DIAGNOSIS — R0789 Other chest pain: Secondary | ICD-10-CM | POA: Diagnosis not present

## 2024-04-03 DIAGNOSIS — K224 Dyskinesia of esophagus: Secondary | ICD-10-CM | POA: Insufficient documentation

## 2024-04-03 DIAGNOSIS — R1310 Dysphagia, unspecified: Secondary | ICD-10-CM | POA: Diagnosis present

## 2024-04-03 LAB — BASIC METABOLIC PANEL WITH GFR
Anion gap: 10 (ref 5–15)
BUN: 7 mg/dL (ref 6–20)
CO2: 27 mmol/L (ref 22–32)
Calcium: 9.7 mg/dL (ref 8.9–10.3)
Chloride: 101 mmol/L (ref 98–111)
Creatinine, Ser: 0.46 mg/dL (ref 0.44–1.00)
GFR, Estimated: >60 mL/min (ref >60)
Glucose, Bld: 89 mg/dL (ref 70–99)
Potassium: 4.1 mmol/L (ref 3.5–5.1)
Sodium: 138 mmol/L (ref 135–145)

## 2024-04-03 LAB — TROPONIN I (HIGH SENSITIVITY)
Troponin I (High Sensitivity): 3 ng/L (ref <18)
Troponin I (High Sensitivity): 3 ng/L (ref <18)

## 2024-04-03 LAB — CBC
HCT: 42.1 % (ref 36.0–46.0)
Hemoglobin: 13.8 g/dL (ref 12.0–15.0)
MCH: 30.7 pg (ref 26.0–34.0)
MCHC: 32.8 g/dL (ref 30.0–36.0)
MCV: 93.8 fL (ref 80.0–100.0)
Platelets: 275 K/uL (ref 150–400)
RBC: 4.49 MIL/uL (ref 3.87–5.11)
RDW: 12.9 % (ref 11.5–15.5)
WBC: 7 K/uL (ref 4.0–10.5)
nRBC: 0 % (ref 0.0–0.2)

## 2024-04-03 MED ORDER — PANTOPRAZOLE SODIUM 20 MG PO TBEC: 20.0000 mg | DELAYED_RELEASE_TABLET | Freq: Every day | ORAL | 0 refills | Status: DC

## 2024-04-03 MED ORDER — LIDOCAINE VISCOUS HCL 2 % MT SOLN
15.0000 mL | Freq: Once | OROMUCOSAL | Status: AC
  Administered 2024-04-03: 15 mL via OROMUCOSAL
  Filled 2024-04-03: qty 15

## 2024-04-03 MED ORDER — DICYCLOMINE HCL 20 MG PO TABS: 20.0000 mg | ORAL_TABLET | Freq: Two times a day (BID) | ORAL | 0 refills | Status: DC

## 2024-04-03 MED ORDER — DICYCLOMINE HCL 10 MG PO CAPS
10.0000 mg | ORAL_CAPSULE | Freq: Once | ORAL | Status: AC
  Administered 2024-04-03: 10 mg via ORAL
  Filled 2024-04-03: qty 1

## 2024-04-03 MED ORDER — ALUM & MAG HYDROXIDE-SIMETH 200-200-20 MG/5ML PO SUSP
30.0000 mL | Freq: Once | ORAL | Status: AC
  Administered 2024-04-03: 30 mL via ORAL
  Filled 2024-04-03: qty 30

## 2024-04-03 NOTE — ED Notes (Signed)
 Awaiting patient from lobby.

## 2024-04-03 NOTE — ED Provider Triage Note (Signed)
 Emergency Medicine Provider Triage Evaluation Note  [name] , a 58 y.o. female  was evaluated in triage.  Pt complains of throat burning this has been ongoing for the past day.  Has taken 40 mg of Pepcid , 40 mg of omeprazole , 40 mg of another medication to help with reflux and reports that there has not been any improvement.  In addition, she did take a Xanax , pain pill, other over-the-counter medication.  Feels that my esophagus needs to be stretched .  Is wondering if she needs a CT scan at this time.  Patient was MSE by me 3 hours after arrival to the ED.  So far workup is benign.  Prior history of vasospasms.  No history of CAD, does have a strong family history of CAD.  Review of Systems  Positive: Chest pain Negative: Fever, nausea  Physical Exam  BP 139/87   Pulse 84   Temp 97.7 F (36.5 C)   Resp 16   LMP  (LMP Unknown) Comment: hysterectomy 2008  SpO2 95%  Gen:   Awake, no distress   Resp:  Normal effort  MSK:   Moves extremities without difficulty  Other:    Medical Decision Making  Medically screening exam initiated at 1:36 PM.  Appropriate orders placed.  [name] was informed that the remainder of the evaluation will be completed by another provider, this initial triage assessment does not replace that evaluation, and the importance of remaining in the ED until their evaluation is complete.     Anita Mcadory, PA-C 04/03/24 [id number]

## 2024-04-03 NOTE — Telephone Encounter (Signed)
 FYI Only or Action Required?: FYI only for provider.  Patient was last seen in primary care on 03/21/2024 by Sherre Clapper, MD.  Called Nurse Triage reporting No chief complaint on file..  Symptoms began several weeks ago.  Interventions attempted: Rest, hydration, or home remedies and Dietary changes.  Symptoms are: unchanged.  Triage Disposition: See Physician Within 24 Hours  Patient/caregiver understands and will follow disposition?: No, refuses disposition   Copied from CRM #[id number]. Topic: Clinical - Red Word Triage >> Apr 03, 2024  8:48 AM Turkey B wrote: [facility] that prompted transfer to Nurse Triage: pt having trouble swallowing   Reason for Disposition  [1] Symptoms of pill stuck in throat or esophagus (e.g., pain in throat or chest, FB sensation) AND [2] no relief after using Care Advice  Answer Assessment - Initial Assessment Questions 1. DESCRIPTION: Tell me more about this problem. Are you  having trouble swallowing liquids, solids, or both? Any trouble with swallowing saliva (spit)?    Both   2. SEVERITY: How bad is the swallowing difficulty?  (Scale 1-10; or mild, moderate, severe)     Moderate  3. ONSET: When did the swallowing problems begin?      Chronic, Several Weeks Ago  4. CAUSE: What do you think is causing the problem?  (e.g., dry mouth, food or pill stuck in throat, mouth pain, sore throat, progression of disease process such as dementia or Parkinson's disease).      Unsure,   5. CHRONIC or RECURRENT: Is this a new problem for you?  If No, ask: How long have you had this problem? (e.g., days, weeks, months)      Chronic  6. OTHER SYMPTOMS: Do you have any other symptoms? (e.g., chest pain, difficulty breathing, mouth sores, sore throat, swollen tongue, chest pain)     None, Denies  7. PREGNANCY: Is there any chance you are pregnant? When was your last menstrual period?     No and No  Protocols used: Swallowing  Difficulty-A-AH

## 2024-04-03 NOTE — ED Provider Notes (Signed)
 [place] EMERGENCY DEPARTMENT AT [facility] Provider Note   CSN: [id number] Arrival date & time: 04/03/24  1024     Patient presents with: Chest Pain   [name] is a 58 y.o. female.   Is a 58 year old female presenting emergency department for dysphagia.  Reports that she has had burning type chest pain for the past 2 days.  Been taking reflux medications with little improvement.  States it feels that liquids and foods seem to be getting stuck in her throat.  She has no lightheadedness, dizziness, palpitations.  Low risk for PE based on Wells criteria.  Not having abdominal pain.   Chest Pain      Prior to Admission medications   Medication Sig Start Date End Date Taking? Authorizing Provider  dicyclomine  (BENTYL ) 20 MG tablet Take 1 tablet (20 mg total) by mouth 2 (two) times daily. 04/03/24  Yes Neysa Caron PARAS, DO  pantoprazole  (PROTONIX ) 20 MG tablet Take 1 tablet (20 mg total) by mouth daily for 14 days. 04/03/24 04/17/24 Yes Holton Sidman, Caron PARAS, DO  acetaminophen  (TYLENOL ) 650 MG CR tablet Take 650-1,300 mg by mouth every 8 (eight) hours as needed for pain.    [provider]  ADVA[facility] 115-21 MCG/ACT inhaler INHALE 2 PUFFS INTO THE LUNGS TWICE DAILY 11/11/23   Cox, Abigail, MD  albuterol  (VENTOLIN  HFA) [phone] Base) MCG/ACT inhaler INHALE 2 PUFFS INTO LUNGS EVERY 6 TO 8 HOURS AS NEEDED FOR WHEEZING 10/05/23   Teressa Harrie HERO, FNP  ALPRAZolam  (XANAX ) 0.25 MG tablet TAKE 1 TABLET(0.25 MG) BY MOUTH DAILY AS NEEDED FOR ANXIETY 02/06/24   Teressa Harrie HERO, FNP  amLODipine  (NORVASC ) 10 MG tablet TAKE 1 TABLET BY MOUTH DAILY 04/12/23   Cox, Abigail, MD  ammonium lactate (LAC-HYDRIN) 12 % lotion Apply 1 Application topically as needed for dry skin. 03/08/24   [provider]  aspirin  EC 81 MG tablet Take 1 tablet (81 mg total) by mouth daily. Swallow whole. 08/05/23   Teressa Harrie HERO, FNP  azelastine  (ASTELIN ) 0.1 % nasal spray Place 2 sprays into both nostrils  2 (two) times daily. Use in each nostril as directed Patient not taking: Reported on 03/21/2024 09/05/23   CoxAbigail, MD  buPROPion  (WELLBUTRIN  XL) 300 MG 24 hr tablet TAKE 1 TABLET(300 MG) BY MOUTH DAILY. 11/20/23   CoxAbigail, MD  chlorhexidine  (PERIDEX ) 0.12 % solution Use as directed 5 mLs in the mouth or throat 2 (two) times daily. 12/19/23   [provider]  Cholecalciferol (VITAMIN D -3 PO) Take by mouth.    [provider]  cyclobenzaprine  (FLEXERIL ) 10 MG tablet Take 1 tablet (10 mg total) by mouth 3 (three) times daily. 02/06/24   Teressa Harrie HERO, FNP  dicyclomine  (BENTYL ) 10 MG capsule TAKE 1 CAPSULE(10 MG) BY MOUTH FOUR TIMES DAILY BEFORE MEALS AND AT BEDTIME 02/06/24   Teressa Harrie HERO, FNP  Docusate Sodium  (DSS) 100 MG CAPS Take 100 mg by mouth daily as needed (constipation).    [provider]  famotidine  (PEPCID ) 40 MG tablet Take 1 tablet (40 mg total) by mouth daily as needed for heartburn or indigestion. 04/17/23   CoxAbigail, MD  fluticasone  (FLONASE ) 50 MCG/ACT nasal spray SHAKE LIQUID AND USE 2 SPRAYS IN EACH NOSTRIL DAILY 10/20/23   Cox, Kirsten, MD  furosemide  (LASIX ) 20 MG tablet TAKE 1 TABLET(20 MG) BY MOUTH DAILY AS NEEDED FOR SWELLING 03/29/24   Cox, Kirsten, MD  gabapentin  (NEURONTIN ) 300 MG capsule  Take 300 mg by mouth 3 (three) times daily. Patient taking differently: Take 300 mg by mouth 3 (three) times daily as needed (Patient only uses when needed).    [provider]  guaiFENesin  (MUCINEX ) 600 MG 12 hr tablet Take 1 tablet (600 mg total) by mouth 2 (two) times daily. Patient taking differently: Take 600 mg by mouth as needed. 10/05/23   Teressa Harrie HERO, FNP  HYDROcodone -acetaminophen  (NORCO/VICODIN) 5-325 MG tablet Take 1-2 tablets by mouth every 6 (six) hours as needed.    [provider]  ipratropium-albuterol  (DUONEB) 0.5-2.5 (3) MG/3ML SOLN Take 3 mLs by nebulization every 6 (six) hours as needed (as needed).    [provider]  ketoconazole (NIZORAL) 2 % cream Apply 1 Application topically 2 (two) times daily. 03/08/24   [provider]  levocetirizine (XYZAL ) 5 MG tablet TAKE 1 TABLET(5 MG) BY MOUTH EVERY EVENING 03/21/24   Cox, Abigail, MD  lubiprostone  (AMITIZA ) 24 MCG capsule Take 1 capsule (24 mcg total) by mouth 2 (two) times daily with a meal. 12/13/23   Cox, Kirsten, MD  MAGNESIUM PO Take 500 mg by mouth daily. 10/04/22   [provider]  meloxicam (MOBIC) 15 MG tablet Take 15 mg by mouth daily.    [provider]  metFORMIN  (GLUCOPHAGE ) 500 MG tablet Take 1 tablet (500 mg total) by mouth 2 (two) times daily with a meal. 10/25/23   Teressa Harrie HERO, FNP  montelukast  (SINGULAIR ) 10 MG tablet Take 1 tablet (10 mg total) by mouth at bedtime. 12/23/23   Teressa Harrie HERO, FNP  Multiple Vitamins-Minerals (MULTIVITAMIN WITH MINERALS) tablet Take 1 tablet by mouth daily.    [provider]  nicotine  (NICODERM CQ  - DOSED IN MG/24 HOURS) 21 mg/24hr patch Place 1 patch (21 mg total) onto the skin daily. 02/06/24   Teressa Harrie HERO, FNP  nitroGLYCERIN  (NITROSTAT ) 0.4 MG SL tablet Place 1 tablet (0.4 mg total) under the tongue every 5 (five) minutes as needed for chest pain. 08/05/23   Teressa Harrie HERO, FNP  omeprazole  (PRILOSEC) 40 MG capsule TAKE 1 CAPSULE(40 MG) BY MOUTH DAILY 08/22/23   Cox, Abigail, MD  REPATHA  SURECLICK 140 MG/ML SOAJ ADMINISTER 1 ML(140MG ) UNDER THE SKIN EVERY 14 DAYS AS DIRECTED 03/16/24   Cox, Abigail, MD  Semaglutide , 2 MG/DOSE, 8 MG/3ML SOPN Inject 2 mg as directed once a week. 03/21/24   CoxAbigail, MD  triamcinolone  (KENALOG ) 0.025 % ointment Apply 1 Application topically 2 (two) times daily. Apply to corners of mouth twice daily Patient taking differently: Apply 1 Application topically 2 (two) times daily as needed. Apply to corners of mouth twice daily 12/06/22   Cox, Abigail, MD  TURMERIC PO Take 1,000 mg by mouth daily. With ginger    [provider]   VITAMIN E PO Take by mouth.    [provider]    Allergies: Levofloxacin, Bactrim  ds [sulfamethoxazole -trimethoprim ], Lipitor [atorvastatin ], Plaquenil  [hydroxychloroquine ], Tape, Zetia [ezetimibe], Codeine, and Other    Review of Systems  Cardiovascular:  Positive for chest pain.    Updated Vital Signs BP 139/66   Pulse 81   Temp 97.7 F (36.5 C) (Oral)   Resp 16   LMP  (LMP Unknown) Comment: hysterectomy 2008  SpO2 100%   Physical Exam Vitals and nursing note reviewed.  Constitutional:      General: She is not in acute distress.    Appearance: She is not ill-appearing or toxic-appearing.  HENT:     Head:  Normocephalic.  Cardiovascular:     Rate and Rhythm: Normal rate and regular rhythm.     Heart sounds: Normal heart sounds.  Pulmonary:     Breath sounds: Normal breath sounds.  Chest:     Chest wall: No tenderness.  Abdominal:     Palpations: Abdomen is soft.     Tenderness: There is no abdominal tenderness. There is no guarding or rebound.  Musculoskeletal:        General: Normal range of motion.     Cervical back: Normal range of motion.  Skin:    General: Skin is warm and dry.     Capillary Refill: Capillary refill takes less than 2 seconds.  Neurological:     Mental Status: She is alert.     (all labs ordered are listed, but only abnormal results are displayed) Labs Reviewed  BASIC METABOLIC PANEL WITH GFR  CBC  TROPONIN I (HIGH SENSITIVITY)  TROPONIN I (HIGH SENSITIVITY)    EKG: EKG Interpretation Date/Time:  Tuesday April 03 2024 10:32:05 EDT Ventricular Rate:  87 PR Interval:  164 QRS Duration:  74 QT Interval:  348 QTC Calculation: 418 R Axis:   57  Text Interpretation: Normal sinus rhythm Low voltage QRS Nonspecific T wave abnormality Abnormal ECG When compared with ECG of 21-Feb-2023 12:13, PREVIOUS ECG IS PRESENT Confirmed by Neysa Clap [phone]) on 04/03/2024 3:52:06 PM  Radiology: [facility] Chest 2 View Result Date:  04/03/2024 CLINICAL DATA:  chest pain EXAM: CHEST - 2 VIEW COMPARISON:  December 12, 2023 FINDINGS: No focal airspace consolidation, pleural effusion, or pneumothorax. No cardiomegaly. No acute fracture or destructive lesion. Multilevel thoracic osteophytosis. Partially visualized lumbar fusion hardware. Cholecystectomy clips. IMPRESSION: No acute cardiopulmonary abnormality. Electronically Signed   By: Rogelia Myers M.D.   On: 04/03/2024 12:29     Procedures   Medications Ordered in the ED  alum & mag hydroxide-simeth (MAALOX/MYLANTA) 200-200-20 MG/5ML suspension 30 mL (30 mLs Oral Given 04/03/24 1721)  lidocaine  (XYLOCAINE ) 2 % viscous mouth solution 15 mL (15 mLs Mouth/Throat Given 04/03/24 1721)  dicyclomine  (BENTYL ) capsule 10 mg (10 mg Oral Given 04/03/24 1826)    Clinical Course as of 04/03/24 2253  Tue Apr 03, 2024  1903 Feeling improved after medications.  Will discharge.  Has follow-up with GI tomorrow [TY]    Clinical Course User Index [TY] Neysa Clap PARAS, DO                                 Medical Decision Making This is a well-appearing 58 year old female presenting emergency department with dysphagia.  She is afebrile nontachycardic hemodynamically stable.  Physical exam largely reassuring clear lungs equal breath sounds.  Troponins negative, EKG without ST segment changes to indicate ischemia my depend interpretation.  Chest x-ray also without abnormality.  No widened mediastinum to suggest dissection.  Her symptoms sound GERD versus reflux versus esophageal spasm/dysmotility.  Treated with GI cocktail, lidocaine  and Bentyl  with improvement of her symptoms.  She has follow-up with her gastroenterologist tomorrow.  Will discharge in stable condition  Amount and/or Complexity of Data Reviewed Independent Historian:     Details: Husband notes he has a follow-up with GI tomorrow. External Data Reviewed:     Details: Had a EGD at the end several years ago that showed candidal  esophagitis Labs: ordered. Decision-making details documented in ED Course. Radiology: ordered and independent interpretation performed. ECG/medicine tests: independent interpretation performed.  Risk OTC drugs. Prescription drug management. Diagnosis or treatment significantly limited by social determinants of health. Risk Details: Poor health literacy      Final diagnoses:  Esophageal dysmotility    ED Discharge Orders          Ordered    dicyclomine  (BENTYL ) 20 MG tablet  2 times daily        04/03/24 1903    pantoprazole  (PROTONIX ) 20 MG tablet  Daily        04/03/24 1903               Neysa Caron PARAS, DO 04/03/24 2253

## 2024-04-03 NOTE — ED Triage Notes (Signed)
 Pt here with central chest pain X2 days. Reports some nausea last night. Took 2 nitroglycerin  today without relief.

## 2024-04-03 NOTE — Telephone Encounter (Signed)
 Inbound call from patient stating she is having trouble swallowing and choking. States she has trouble swallowing. Patient is considering going to the emergency room. Patient is scheduled for 7/30 but does not think she can wait that long. Requesting a call back to be advised further. Please advise, thank you.

## 2024-04-03 NOTE — Telephone Encounter (Signed)
 Patient last seen in the office in 2021. Currently in the ER for evaluation, patient needs to have eval completed. Keep follow up on 7/30 with Dr. Charlanne.

## 2024-04-03 NOTE — Discharge Instructions (Signed)
 You may take peppermint oil altoids before food as any help your symptoms.  We also prescribed you some antispasmodic medications.  Please follow-up with the GI doctor as planned.  Return for fevers, chills, inability to swallow, difficulty breathing or any new or worsening symptoms that are concerning to you.

## 2024-04-04 ENCOUNTER — Encounter: Payer: Self-pay | Admitting: Gastroenterology

## 2024-04-04 ENCOUNTER — Ambulatory Visit: Admitting: Gastroenterology

## 2024-04-04 VITALS — BP 122/64 | HR 96 | Ht 66.0 in | Wt 172.0 lb

## 2024-04-04 DIAGNOSIS — R09A2 Foreign body sensation, throat: Secondary | ICD-10-CM | POA: Diagnosis not present

## 2024-04-04 DIAGNOSIS — K219 Gastro-esophageal reflux disease without esophagitis: Secondary | ICD-10-CM | POA: Diagnosis not present

## 2024-04-04 DIAGNOSIS — R1310 Dysphagia, unspecified: Secondary | ICD-10-CM

## 2024-04-04 DIAGNOSIS — Z860100 Personal history of colon polyps, unspecified: Secondary | ICD-10-CM | POA: Diagnosis not present

## 2024-04-04 DIAGNOSIS — R1319 Other dysphagia: Secondary | ICD-10-CM

## 2024-04-04 MED ORDER — PANTOPRAZOLE SODIUM 40 MG PO TBEC: 40.0000 mg | DELAYED_RELEASE_TABLET | Freq: Two times a day (BID) | ORAL | 1 refills | Status: DC

## 2024-04-04 MED ORDER — ONDANSETRON 4 MG PO TBDP: 4.0000 mg | ORAL_TABLET | Freq: Three times a day (TID) | ORAL | 0 refills | Status: DC | PRN

## 2024-04-04 NOTE — Patient Instructions (Addendum)
 _______________________________________________________  If your blood pressure at your visit was 140/90 or greater, please contact your primary care physician to follow up on this.  _______________________________________________________  If you are age 59 or older, your body mass index should be between 23-30. Your Body mass index is 27.76 kg/m. If this is out of the aforementioned range listed, please consider follow up with your Primary Care Provider.  If you are age 59 or younger, your body mass index should be between 19-25. Your Body mass index is 27.76 kg/m. If this is out of the aformentioned range listed, please consider follow up with your Primary Care Provider.   ________________________________________________________  The [place] [facility] providers would like to encourage you to use MYCHART to communicate with providers for non-urgent requests or questions.  Due to long hold times on the telephone, sending your provider a message by [facility] may be a faster and more efficient way to get a response.  Please allow 48 business hours for a response.  Please remember that this is for non-urgent requests.  _______________________________________________________  Avoid NSAID's  We have sent the following medications to your pharmacy for you to pick up at your convenience: Protonix  40mg  2 times a day Zofran   You have been scheduled for a Barium Esophogram at [facility] Radiology (1st floor of the hospital) on 04-20-24 at 9am. Please arrive 30 minutes prior to your appointment for registration. Make certain not to have anything to eat or drink 3 hours prior to your test. If you need to reschedule for any reason, please contact radiology at [phone] to do so. __________________________________________________________________ A barium swallow is an examination that concentrates on views of the esophagus. This tends to be a double contrast exam (barium and two liquids which, when combined,  create a gas to distend the wall of the oesophagus) or single contrast (non-ionic iodine based). The study is usually tailored to your symptoms so a good history is essential. Attention is paid during the study to the form, structure and configuration of the esophagus, looking for functional disorders (such as aspiration, dysphagia, achalasia, motility and reflux) EXAMINATION You may be asked to change into a gown, depending on the type of swallow being performed. A radiologist and radiographer will perform the procedure. The radiologist will advise you of the type of contrast selected for your procedure and direct you during the exam. You will be asked to stand, sit or lie in several different positions and to hold a small amount of fluid in your mouth before being asked to swallow while the imaging is performed .In some instances you may be asked to swallow barium coated marshmallows to assess the motility of a solid food bolus. The exam can be recorded as a digital or video fluoroscopy procedure. POST PROCEDURE It will take 1-2 days for the barium to pass through your system. To facilitate this, it is important, unless otherwise directed, to increase your fluids for the next 24-48hrs and to resume your normal diet.  This test typically takes about 30 minutes to perform. __________________________________________________________________________________  [name] have been scheduled for an endoscopy. Please follow written instructions given to you at your visit today.  If you use inhalers (even only as needed), please bring them with you on the day of your procedure.  If you take any of the following medications, they will need to be adjusted prior to your procedure:   DO NOT TAKE 7 DAYS PRIOR TO TEST- Trulicity (dulaglutide) Ozempic , Wegovy  (semaglutide ) Mounjaro  (tirzepatide ) Bydureon Bcise (exanatide  extended release)  DO NOT TAKE 1 DAY PRIOR TO YOUR TEST Rybelsus  (semaglutide ) Adlyxin  (lixisenatide) Victoza (liraglutide) Byetta (exanatide) ___________________________________________________________________________  Thank you,  Dr. Lynnie Bring

## 2024-04-04 NOTE — Progress Notes (Signed)
 Chief Complaint: Dysphagia  Referring Provider:  Sherre Clapper, MD      ASSESSMENT AND PLAN;   #1. GERD with globus sensation/eso dysphagia  #2. H/O colonic polyps 04/2017.  Repeat colonoscopy due 04/2022. She wants to hold off  Plan: - Protonix  40mg  po bid #60, 6 refills.  - Zofran  4mg  ODT Q8hrs prn #20 - Ba Swallow with tab - EGD with dil. - Hold ozempic  1 week before endocopy - I have instructed her that she needs to chew food specially meats and breads well and eat slowly. - Avoid NSAIDs. - She will let us  know when she decides to get colonoscopy performed.   HPI:    [name] is a 58 y.o. female  History of Present Illness [name] is a 58 year old female who presents with difficulty swallowing and throat pain.  She experiences severe dysphagia, describing an inability to swallow even water, accompanied by significant odynophagia localized to her throat, which worsens with swallowing. This episode prompted a visit to the emergency room yesterday, where she was administered Maalox, lidocaine  to swallow, and a GI cocktail, which provided some relief. She was also prescribed Protonix , which she had taken previously but stopped due to cost. She has since resumed it after obtaining Medicaid coverage.  She has a history of esophageal issues, including a previous esophageal candidiasis diagnosed in 2021. She experiences hiccups and occasional choking when eating, which she attributes to possible esophageal narrowing. No recent antibiotic use. She experiences frequent heartburn and takes Protonix  twice daily. She also takes Bentyl  10 mg three times a day, and was recently given a higher dose of 20 mg for 14 days.  She reports occasional nausea and vomiting, with a recent episode of feeling like she was going to vomit but did not. She has a history of constipation and diarrhea, for which she recently started taking psyllium husk, resulting in diarrhea followed by  constipation. She recalls a history of gallstones and pancreatitis following an ERCP procedure.  Her current medications include Tylenol , meloxicam for arthritis, and Ozempic  for diabetes management. She also takes Arnica and bioflavonoids, which she started recently.  Has history of sinus problems - didn't want to have Sx-seen by Dr West/Dr Honor in the past.  Past GI [facility]  EGD 11/2019 - Esophageal plaques were found, consistent with candidiasis. ( brushed and biopsied) .  Treated with Diflucan .  Colon 04/2017 ([facility]) -Colon polyp s/p polypectomy. Bx-TA. Rpt 5 yrs - Wants to hold off.  SHe does understand risks and benefits including risks of missing colorectal neoplasms.  Past Medical History:  Diagnosis Date   Allergic rhinitis    Anginal pain (HCC)    Arthritis    Blood transfusion without reported diagnosis    Cellulitis, face 06/08/2022   Complication of anesthesia    COPD (chronic obstructive pulmonary disease) (HCC)    Dental abscess 06/09/2022   GERD (gastroesophageal reflux disease)    History of colon polyps    History of kidney stones    HTN (hypertension)    Hyperlipidemia    Kidney stones    Pancreatitis    Pneumonia    PONV (postoperative nausea and vomiting)    Primary hypertension 10/20/2021   Psychotic depression (HCC)    Rheumatoid arthritis (HCC) 2024   Shingles February 26th, 2001   Tendinitis    both arms    Past Surgical History:  Procedure Laterality Date   ABDOMINAL HYSTERECTOMY     BACK  SURGERY  02/2023   CARDIAC CATHETERIZATION  2008   CESAREAN SECTION     CHOLECYSTECTOMY     COLONOSCOPY  05/02/2017   Colonic polyp status post polypectomy. Small internal hemorrhoids   FRACTURE SURGERY Bilateral    [facility]   REPLACEMENT TOTAL KNEE Right 02/09/2019   TUBAL LIGATION      Family History  Problem Relation Age of Onset   Arthritis Mother    Lung disease Mother    Parkinson's disease Mother    COPD Father    Arthritis Father    Healthy Sister     Breast cancer Maternal Aunt        great aunt   Rheum arthritis Maternal Uncle    Rheum arthritis Maternal Grandfather    Healthy Son    High Cholesterol Son    Healthy Son    Colon cancer Cousin        mother's cousin   Diabetes Other    Stroke Other    Hypertension Other    Hyperlipidemia Other    Asthma Other    Heart failure Other    Thyroid  disease Other    Heart attack Other    COPD Other    Arrhythmia Other    Arthritis Other    Migraines Other     Social History   Tobacco Use   Smoking status: Every Day    Current packs/day: 1.00    Types: Cigarettes    Passive exposure: Past   Smokeless tobacco: Never  Vaping Use   Vaping status: Never Used  Substance Use Topics   Alcohol use: Yes    Comment: rarely   Drug use: No    Current Outpatient Medications  Medication Sig Dispense Refill   acetaminophen  (TYLENOL ) 650 MG CR tablet Take 650-1,300 mg by mouth every 8 (eight) hours as needed for pain.     ADVAIR HFA 115-21 MCG/ACT inhaler INHALE 2 PUFFS INTO THE LUNGS TWICE DAILY 36 g 0   albuterol  (VENTOLIN  HFA) 108 (90 Base) MCG/ACT inhaler INHALE 2 PUFFS INTO LUNGS EVERY 6 TO 8 HOURS AS NEEDED FOR WHEEZING 18 g 1   ALPRAZolam  (XANAX ) 0.25 MG tablet TAKE 1 TABLET(0.25 MG) BY MOUTH DAILY AS NEEDED FOR ANXIETY 30 tablet 1   amLODipine  (NORVASC ) 10 MG tablet TAKE 1 TABLET BY MOUTH DAILY 90 tablet 0   ammonium lactate (LAC-HYDRIN) 12 % lotion Apply 1 Application topically as needed for dry skin.     aspirin  EC 81 MG tablet Take 1 tablet (81 mg total) by mouth daily. Swallow whole.     azelastine  (ASTELIN ) 0.1 % nasal spray Place 2 sprays into both nostrils 2 (two) times daily. Use in each nostril as directed 30 mL 12   buPROPion  (WELLBUTRIN  XL) 300 MG 24 hr tablet TAKE 1 TABLET(300 MG) BY MOUTH DAILY. 90 tablet 1   chlorhexidine  (PERIDEX ) 0.12 % solution Use as directed 5 mLs in the mouth or throat 2 (two) times daily.     Cholecalciferol (VITAMIN D -3 PO) Take by  mouth.     cyclobenzaprine  (FLEXERIL ) 10 MG tablet Take 1 tablet (10 mg total) by mouth 3 (three) times daily. 102 tablet 0   dicyclomine  (BENTYL ) 20 MG tablet Take 1 tablet (20 mg total) by mouth 2 (two) times daily. 20 tablet 0   Docusate Sodium  (DSS) 100 MG CAPS Take 100 mg by mouth daily as needed (constipation).     famotidine  (PEPCID ) 40 MG tablet Take 1 tablet (40 mg  total) by mouth daily as needed for heartburn or indigestion. 90 tablet 1   fluticasone  (FLONASE ) 50 MCG/ACT nasal spray SHAKE LIQUID AND USE 2 SPRAYS IN EACH NOSTRIL DAILY 16 g 6   furosemide  (LASIX ) 20 MG tablet TAKE 1 TABLET(20 MG) BY MOUTH DAILY AS NEEDED FOR SWELLING 30 tablet 2   gabapentin  (NEURONTIN ) 300 MG capsule Take 300 mg by mouth 3 (three) times daily. (Patient taking differently: Take 300 mg by mouth 3 (three) times daily as needed (Patient only uses when needed).)     guaiFENesin  (MUCINEX ) 600 MG 12 hr tablet Take 1 tablet (600 mg total) by mouth 2 (two) times daily. (Patient taking differently: Take 600 mg by mouth as needed.) 60 tablet 1   HYDROcodone -acetaminophen  (NORCO/VICODIN) 5-325 MG tablet Take 1-2 tablets by mouth every 6 (six) hours as needed.     ipratropium-albuterol  (DUONEB) 0.5-2.5 (3) MG/3ML SOLN Take 3 mLs by nebulization every 6 (six) hours as needed (as needed).     ketoconazole (NIZORAL) 2 % cream Apply 1 Application topically 2 (two) times daily.     levocetirizine (XYZAL ) 5 MG tablet TAKE 1 TABLET(5 MG) BY MOUTH EVERY EVENING 90 tablet 0   lubiprostone  (AMITIZA ) 24 MCG capsule Take 1 capsule (24 mcg total) by mouth 2 (two) times daily with a meal. 60 capsule 5   MAGNESIUM PO Take 500 mg by mouth daily.     meloxicam (MOBIC) 15 MG tablet Take 15 mg by mouth daily.     metFORMIN  (GLUCOPHAGE ) 500 MG tablet Take 1 tablet (500 mg total) by mouth 2 (two) times daily with a meal. 180 tablet 3   montelukast  (SINGULAIR ) 10 MG tablet Take 1 tablet (10 mg total) by mouth at bedtime. 90 tablet 0    Multiple Vitamins-Minerals (MULTIVITAMIN WITH MINERALS) tablet Take 1 tablet by mouth daily.     nicotine  (NICODERM CQ  - DOSED IN MG/24 HOURS) 21 mg/24hr patch Place 1 patch (21 mg total) onto the skin daily. 28 patch 1   nitroGLYCERIN  (NITROSTAT ) 0.4 MG SL tablet Place 1 tablet (0.4 mg total) under the tongue every 5 (five) minutes as needed for chest pain. 50 tablet 3   omeprazole  (PRILOSEC) 40 MG capsule TAKE 1 CAPSULE(40 MG) BY MOUTH DAILY 90 capsule 3   pantoprazole  (PROTONIX ) 20 MG tablet Take 1 tablet (20 mg total) by mouth daily for 14 days. 14 tablet 0   REPATHA  SURECLICK 140 MG/ML SOAJ ADMINISTER 1 ML(140MG ) UNDER THE SKIN EVERY 14 DAYS AS DIRECTED 2 mL 2   Semaglutide , 2 MG/DOSE, (OZEMPIC , 2 MG/DOSE,) 8 MG/3ML SOPN Inject into the skin.     triamcinolone  (KENALOG ) 0.025 % ointment Apply 1 Application topically 2 (two) times daily. Apply to corners of mouth twice daily (Patient taking differently: Apply 1 Application topically 2 (two) times daily as needed. Apply to corners of mouth twice daily) 30 g 0   TURMERIC PO Take 1,000 mg by mouth daily. With ginger     VITAMIN E PO Take by mouth.     dicyclomine  (BENTYL ) 10 MG capsule TAKE 1 CAPSULE(10 MG) BY MOUTH FOUR TIMES DAILY BEFORE MEALS AND AT BEDTIME 120 capsule 2   Semaglutide , 2 MG/DOSE, 8 MG/3ML SOPN Inject 2 mg as directed once a week. (Patient not taking: Reported on 04/04/2024) 9 mL 1   No current facility-administered medications for this visit.    Allergies  Allergen Reactions   Levofloxacin Other (See Comments) and Rash    Rash all over redness  Bactrim  Ds [Sulfamethoxazole -Trimethoprim ] Nausea Only   Lipitor [Atorvastatin ] Other (See Comments)    Myalgia    Plaquenil  [Hydroxychloroquine ]     Elevated Liver enzymes.    Tape Hives   Zetia [Ezetimibe]     Muscle pain   Codeine Nausea Only   Other Rash    bandaids leave a red area if left on too long    Review of Systems:  Constitutional: Denies fever, chills,  diaphoresis, appetite change and fatigue.  HEENT: Denies photophobia, eye pain, redness.  Has sinus problems.SABRA   Respiratory: Denies SOB, DOE, cough, chest tightness,  and wheezing.   Cardiovascular: Denies chest pain, palpitations and leg swelling.  Genitourinary: Denies dysuria, urgency, frequency, hematuria, flank pain and difficulty urinating.  Musculoskeletal: Denies myalgias, has back pain, joint swelling, arthralgias and gait problem.  Skin: No rash.  Neurological: Denies dizziness, seizures, syncope, weakness, light-headedness, numbness and headaches.  Hematological: Denies adenopathy. Easy bruising, personal or family bleeding history  Psychiatric/Behavioral: Has anxiety or depression     Physical Exam:    BP 122/64   Pulse 96   Ht 5' 6 (1.676 m)   Wt 172 lb (78 kg)   LMP  (LMP Unknown) Comment: hysterectomy 2008  BMI 27.76 kg/m  Wt Readings from Last 3 Encounters:  04/04/24 172 lb (78 kg)  03/21/24 175 lb 9.6 oz (79.7 kg)  02/06/24 175 lb (79.4 kg)   Constitutional:  Well-developed, in no acute distress. Psychiatric: Normal mood and affect. Behavior is normal. HEENT: Pupils normal.  Conjunctivae are normal. No scleral icterus.  No thrush. Cardiovascular: Normal rate, regular rhythm. No edema Pulmonary/chest: Effort normal and breath sounds normal. No wheezing, rales or rhonchi. Abdominal: Soft, nondistended.  Epigastric tenderness. bowel sounds active throughout. There are no masses palpable. No hepatomegaly. Rectal:  defered Neurological: Alert and oriented to person place and time. Skin: Skin is warm and dry. No rashes noted.  Data Reviewed: I have personally reviewed following labs and imaging studies  CBC:    Latest Ref Rng & Units 04/03/2024   10:34 AM 11/24/2023    8:05 AM 09/23/2023    9:27 AM  CBC  WBC 4.0 - 10.5 K/uL 7.0  5.8  5.3   Hemoglobin 12.0 - 15.0 g/dL 86.1  85.5  86.1   Hematocrit 36.0 - 46.0 % 42.1  43.6  42.3   Platelets 150 - 400 K/uL 275   356  313     CMP:    Latest Ref Rng & Units 04/03/2024   10:34 AM 12/08/2023   10:40 AM 11/24/2023    8:05 AM  CMP  Glucose 70 - 99 mg/dL 89   87   BUN 6 - 20 mg/dL 7   15   Creatinine 9.55 - 1.00 mg/dL 9.53   9.48   Sodium 864 - 145 mmol/L 138   138   Potassium 3.5 - 5.1 mmol/L 4.1   4.8   Chloride 98 - 111 mmol/L 101   101   CO2 22 - 32 mmol/L 27   21   Calcium  8.9 - 10.3 mg/dL 9.7   9.5   Total Protein 6.1 - 8.1 g/dL  6.6  6.6   Total Bilirubin 0.2 - 1.2 mg/dL  0.2  <9.7   Alkaline Phos 44 - 121 IU/L   104   AST 10 - 35 U/L  15  36   ALT 6 - 29 U/L  23  70       Anselm Bring,  MD 04/04/2024, 1:49 PM  Cc: Sherre Clapper, MD

## 2024-04-11 ENCOUNTER — Telehealth: Payer: Self-pay

## 2024-04-11 ENCOUNTER — Encounter: Payer: Self-pay | Admitting: Physician Assistant

## 2024-04-11 ENCOUNTER — Ambulatory Visit: Attending: Physician Assistant | Admitting: Physician Assistant

## 2024-04-11 VITALS — BP 125/70 | HR 78 | Resp 15 | Ht 66.0 in | Wt 175.0 lb

## 2024-04-11 DIAGNOSIS — Z8261 Family history of arthritis: Secondary | ICD-10-CM | POA: Diagnosis present

## 2024-04-11 DIAGNOSIS — M7061 Trochanteric bursitis, right hip: Secondary | ICD-10-CM | POA: Insufficient documentation

## 2024-04-11 DIAGNOSIS — Z840 Family history of diseases of the skin and subcutaneous tissue: Secondary | ICD-10-CM | POA: Insufficient documentation

## 2024-04-11 DIAGNOSIS — Z720 Tobacco use: Secondary | ICD-10-CM | POA: Diagnosis present

## 2024-04-11 DIAGNOSIS — M4126 Other idiopathic scoliosis, lumbar region: Secondary | ICD-10-CM | POA: Diagnosis not present

## 2024-04-11 DIAGNOSIS — J301 Allergic rhinitis due to pollen: Secondary | ICD-10-CM | POA: Diagnosis present

## 2024-04-11 DIAGNOSIS — Z96651 Presence of right artificial knee joint: Secondary | ICD-10-CM | POA: Insufficient documentation

## 2024-04-11 DIAGNOSIS — Z79899 Other long term (current) drug therapy: Secondary | ICD-10-CM | POA: Insufficient documentation

## 2024-04-11 DIAGNOSIS — Z8709 Personal history of other diseases of the respiratory system: Secondary | ICD-10-CM | POA: Diagnosis present

## 2024-04-11 DIAGNOSIS — Q6671 Congenital pes cavus, right foot: Secondary | ICD-10-CM | POA: Diagnosis not present

## 2024-04-11 DIAGNOSIS — I700 Atherosclerosis of aorta: Secondary | ICD-10-CM | POA: Diagnosis present

## 2024-04-11 DIAGNOSIS — G43809 Other migraine, not intractable, without status migrainosus: Secondary | ICD-10-CM | POA: Diagnosis present

## 2024-04-11 DIAGNOSIS — M19071 Primary osteoarthritis, right ankle and foot: Secondary | ICD-10-CM | POA: Diagnosis not present

## 2024-04-11 DIAGNOSIS — E782 Mixed hyperlipidemia: Secondary | ICD-10-CM | POA: Diagnosis present

## 2024-04-11 DIAGNOSIS — M19072 Primary osteoarthritis, left ankle and foot: Secondary | ICD-10-CM | POA: Diagnosis present

## 2024-04-11 DIAGNOSIS — R7303 Prediabetes: Secondary | ICD-10-CM | POA: Diagnosis present

## 2024-04-11 DIAGNOSIS — M7712 Lateral epicondylitis, left elbow: Secondary | ICD-10-CM | POA: Insufficient documentation

## 2024-04-11 DIAGNOSIS — M7062 Trochanteric bursitis, left hip: Secondary | ICD-10-CM | POA: Insufficient documentation

## 2024-04-11 DIAGNOSIS — M19042 Primary osteoarthritis, left hand: Secondary | ICD-10-CM | POA: Diagnosis present

## 2024-04-11 DIAGNOSIS — I10 Essential (primary) hypertension: Secondary | ICD-10-CM | POA: Diagnosis not present

## 2024-04-11 DIAGNOSIS — F411 Generalized anxiety disorder: Secondary | ICD-10-CM | POA: Diagnosis present

## 2024-04-11 DIAGNOSIS — M19041 Primary osteoarthritis, right hand: Secondary | ICD-10-CM | POA: Insufficient documentation

## 2024-04-11 DIAGNOSIS — M7711 Lateral epicondylitis, right elbow: Secondary | ICD-10-CM | POA: Diagnosis not present

## 2024-04-11 DIAGNOSIS — K219 Gastro-esophageal reflux disease without esophagitis: Secondary | ICD-10-CM | POA: Insufficient documentation

## 2024-04-11 DIAGNOSIS — M7120 Synovial cyst of popliteal space [Baker], unspecified knee: Secondary | ICD-10-CM | POA: Insufficient documentation

## 2024-04-11 DIAGNOSIS — R7989 Other specified abnormal findings of blood chemistry: Secondary | ICD-10-CM | POA: Insufficient documentation

## 2024-04-11 DIAGNOSIS — Q6672 Congenital pes cavus, left foot: Secondary | ICD-10-CM | POA: Diagnosis present

## 2024-04-11 DIAGNOSIS — M0600 Rheumatoid arthritis without rheumatoid factor, unspecified site: Secondary | ICD-10-CM | POA: Diagnosis not present

## 2024-04-11 DIAGNOSIS — M51369 Other intervertebral disc degeneration, lumbar region without mention of lumbar back pain or lower extremity pain: Secondary | ICD-10-CM | POA: Insufficient documentation

## 2024-04-11 NOTE — Telephone Encounter (Signed)
 Patient to start plaquenil  pending labs.

## 2024-04-12 ENCOUNTER — Ambulatory Visit: Payer: Self-pay | Admitting: Physician Assistant

## 2024-04-12 ENCOUNTER — Other Ambulatory Visit: Payer: Self-pay | Admitting: *Deleted

## 2024-04-12 LAB — HEPATIC FUNCTION PANEL
AG Ratio: 2.1 (calc) (ref 1.0–2.5)
ALT: 21 U/L (ref 6–29)
AST: 17 U/L (ref 10–35)
Albumin: 4.6 g/dL (ref 3.6–5.1)
Alkaline phosphatase (APISO): 91 U/L (ref 37–153)
Bilirubin, Direct: 0.1 mg/dL (ref 0.0–0.2)
Globulin: 2.2 g/dL (ref 1.9–3.7)
Indirect Bilirubin: 0.2 mg/dL (ref 0.2–1.2)
Total Bilirubin: 0.3 mg/dL (ref 0.2–1.2)
Total Protein: 6.8 g/dL (ref 6.1–8.1)

## 2024-04-12 MED ORDER — HYDROXYCHLOROQUINE SULFATE 200 MG PO TABS
ORAL_TABLET | ORAL | 0 refills | Status: DC
Start: 2024-04-12 — End: 2024-08-01

## 2024-04-12 NOTE — Progress Notes (Signed)
 Hepatic function panel WNL.  Ok to resume plaquenil --please pend new prescription for plaquenil  200 mg 1 tablet by mouth twice daily Monday through Friday. Recommend updating labs in 1 month

## 2024-04-16 ENCOUNTER — Other Ambulatory Visit: Payer: Self-pay | Admitting: Family Medicine

## 2024-04-16 ENCOUNTER — Encounter: Payer: Self-pay | Admitting: Family Medicine

## 2024-04-16 ENCOUNTER — Other Ambulatory Visit: Payer: Self-pay

## 2024-04-16 ENCOUNTER — Telehealth: Payer: Self-pay | Admitting: Gastroenterology

## 2024-04-16 DIAGNOSIS — F17219 Nicotine dependence, cigarettes, with unspecified nicotine-induced disorders: Secondary | ICD-10-CM

## 2024-04-16 NOTE — Telephone Encounter (Signed)
 Pt stated that she had questions about her medication prior to her procedure.  All questions were answered.  Pt verbalized understanding with all questions answered.

## 2024-04-16 NOTE — Telephone Encounter (Signed)
 Inbound call from patient stating she would like to speak to nurse in regards to her taking an arthritis medication called Plaquenil  and her having a procedure scheduled for 8/1 with Dr.Gupta.  Requesting a call back  Please advise Thank you

## 2024-04-18 ENCOUNTER — Encounter: Payer: Self-pay | Admitting: Pulmonary Disease

## 2024-04-18 ENCOUNTER — Ambulatory Visit: Admitting: Gastroenterology

## 2024-04-18 ENCOUNTER — Ambulatory Visit: Admitting: Pulmonary Disease

## 2024-04-18 VITALS — BP 118/80 | HR 94 | Ht 66.0 in | Wt 174.0 lb

## 2024-04-18 DIAGNOSIS — J4489 Other specified chronic obstructive pulmonary disease: Secondary | ICD-10-CM | POA: Diagnosis not present

## 2024-04-18 DIAGNOSIS — J439 Emphysema, unspecified: Secondary | ICD-10-CM

## 2024-04-18 DIAGNOSIS — F17210 Nicotine dependence, cigarettes, uncomplicated: Secondary | ICD-10-CM | POA: Diagnosis not present

## 2024-04-18 NOTE — Addendum Note (Signed)
 Addended byBETHA THEOPHILUS ROOSEVELT on: 04/18/2024 10:20 AM   Modules accepted: Level of Service

## 2024-04-18 NOTE — Patient Instructions (Signed)
 VISIT SUMMARY:  Today, you had a follow-up appointment to discuss your lung health, particularly your COPD. We reviewed your respiratory symptoms, smoking history, and current medications. We also discussed your cardiac and esophageal symptoms, musculoskeletal pain, and overall functional status.  YOUR PLAN:  -CHRONIC OBSTRUCTIVE PULMONARY DISEASE (COPD): COPD is a chronic lung disease that causes breathing difficulties. You should continue using Advair and albuterol  as needed. If your symptoms worsen, we may consider switching to Trelegy or Breztri . We will schedule a lung function test in six months and follow up then.  -NICOTINE  DEPENDENCE, CIGARETTES: Nicotine  dependence is an addiction to tobacco products. You have made progress in reducing your smoking to half a pack per day with the help of nicotine  patches and Wellbutrin . Continue using these aids and work towards complete smoking cessation.  -SOLITARY PULMONARY NODULE, RIGHT LUNG: A solitary pulmonary nodule is a small, round growth in the lung. Your nodule appears benign, but please ensure a follow-up CT scan in January 2026 by her primary care to monitor it.  INSTRUCTIONS:  Please continue using your current medications as prescribed. Schedule a lung function test in six months. Follow up with your primary care provider for a CT scan in January 2026 to monitor the pulmonary nodule.
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
## 2024-04-20 ENCOUNTER — Ambulatory Visit (HOSPITAL_COMMUNITY)
Admission: RE | Admit: 2024-04-20 | Discharge: 2024-04-20 | Disposition: A | Discharge status: Home / Self Care | Source: Ambulatory Visit | Attending: Gastroenterology | Admitting: Gastroenterology

## 2024-04-20 DIAGNOSIS — R09A2 Foreign body sensation, throat: Secondary | ICD-10-CM | POA: Insufficient documentation

## 2024-04-20 DIAGNOSIS — K219 Gastro-esophageal reflux disease without esophagitis: Secondary | ICD-10-CM | POA: Insufficient documentation

## 2024-04-20 DIAGNOSIS — R1319 Other dysphagia: Secondary | ICD-10-CM | POA: Insufficient documentation

## 2024-04-26 ENCOUNTER — Other Ambulatory Visit: Payer: Self-pay | Admitting: Gastroenterology

## 2024-05-02 ENCOUNTER — Ambulatory Visit: Payer: Self-pay | Admitting: Gastroenterology

## 2024-05-03 DIAGNOSIS — M25532 Pain in left wrist: Secondary | ICD-10-CM | POA: Diagnosis not present

## 2024-05-09 DIAGNOSIS — M19132 Post-traumatic osteoarthritis, left wrist: Secondary | ICD-10-CM | POA: Diagnosis not present

## 2024-05-09 DIAGNOSIS — G5622 Lesion of ulnar nerve, left upper limb: Secondary | ICD-10-CM | POA: Diagnosis not present

## 2024-05-09 DIAGNOSIS — M19022 Primary osteoarthritis, left elbow: Secondary | ICD-10-CM | POA: Diagnosis not present

## 2024-05-09 DIAGNOSIS — M069 Rheumatoid arthritis, unspecified: Secondary | ICD-10-CM | POA: Diagnosis not present

## 2024-05-09 DIAGNOSIS — G5602 Carpal tunnel syndrome, left upper limb: Secondary | ICD-10-CM | POA: Diagnosis not present

## 2024-05-09 DIAGNOSIS — M19021 Primary osteoarthritis, right elbow: Secondary | ICD-10-CM | POA: Diagnosis not present

## 2024-05-09 DIAGNOSIS — F17219 Nicotine dependence, cigarettes, with unspecified nicotine-induced disorders: Secondary | ICD-10-CM | POA: Diagnosis not present

## 2024-05-11 ENCOUNTER — Other Ambulatory Visit: Payer: Self-pay | Admitting: Family Medicine

## 2024-05-11 ENCOUNTER — Encounter: Payer: Self-pay | Admitting: Family Medicine

## 2024-05-11 ENCOUNTER — Telehealth: Payer: Self-pay | Admitting: Gastroenterology

## 2024-05-11 NOTE — Telephone Encounter (Signed)
 Spoke with patient & she would like to know how soon she can have anesthesia after her EGD on the 9/15, due to a wrist surgery that needs to happen. Advised her it would be best to discuss anesthesia concerns with the surgeon's office & their anesthesia team to decide on timing. As far as returning to normal activities, advised her this is can be done the next day.

## 2024-05-11 NOTE — Telephone Encounter (Signed)
 Received call from patient, states she would like f/u call for in depth discussion regarding procedure, will have future procedure so she is worried about complications. Please advise and review  Thank you

## 2024-05-13 ENCOUNTER — Encounter: Payer: Self-pay | Admitting: Family Medicine

## 2024-05-13 DIAGNOSIS — J301 Allergic rhinitis due to pollen: Secondary | ICD-10-CM

## 2024-05-14 MED ORDER — LEVOCETIRIZINE DIHYDROCHLORIDE 5 MG PO TABS: 5.0000 mg | ORAL_TABLET | Freq: Every evening | ORAL | 0 refills | Status: DC

## 2024-05-15 ENCOUNTER — Telehealth: Payer: Self-pay | Admitting: Gastroenterology

## 2024-05-15 NOTE — Telephone Encounter (Signed)
 Inbound call from patient stating she would like to speak to someone in regards to upcoming procedure on 9/15. Patient would like to know if insurance would cover procedure ?  Requesting a call back  Please advise  Thank you

## 2024-05-22 ENCOUNTER — Other Ambulatory Visit: Payer: Self-pay | Admitting: Gastroenterology

## 2024-05-22 ENCOUNTER — Telehealth: Payer: Self-pay | Admitting: *Deleted

## 2024-05-22 DIAGNOSIS — J441 Chronic obstructive pulmonary disease with (acute) exacerbation: Secondary | ICD-10-CM

## 2024-05-22 NOTE — Progress Notes (Unsigned)
 Complex Care Management Note Care Guide Note  05/22/2024 Name: [name] MRN: [id number] DOB: 1966-03-05   Complex Care Management Outreach Attempts: An unsuccessful telephone outreach was attempted today to offer the patient information about available complex care management services.  Follow Up Plan:  Additional outreach attempts will be made to offer the patient complex care management information and services.   Encounter Outcome:  No Answer  Harlene Satterfield  [facility]  [facility], [facility] Guide  Direct Dial: [phone]  Fax [phone]

## 2024-05-23 ENCOUNTER — Telehealth: Payer: Self-pay | Admitting: Gastroenterology

## 2024-05-23 MED ORDER — ONDANSETRON 4 MG PO TBDP: 4.0000 mg | ORAL_TABLET | Freq: Three times a day (TID) | ORAL | 0 refills | Status: DC | PRN

## 2024-05-23 NOTE — Telephone Encounter (Signed)
 PT would like a refill for Zofran  sent to the [facility] in [place]. Please advise.

## 2024-05-23 NOTE — Telephone Encounter (Signed)
 PT returning call to ask that someone contact her in regards to her procedure on 9/15. She just needs to know if her insurance will cover. Please advise.

## 2024-05-23 NOTE — Telephone Encounter (Signed)
 Done

## 2024-05-23 NOTE — Telephone Encounter (Signed)
 Can someone please advise since Dr Charlanne isnt here?

## 2024-05-24 ENCOUNTER — Other Ambulatory Visit: Payer: Self-pay | Admitting: Family Medicine

## 2024-05-24 DIAGNOSIS — F17219 Nicotine dependence, cigarettes, with unspecified nicotine-induced disorders: Secondary | ICD-10-CM

## 2024-05-24 NOTE — Progress Notes (Unsigned)
 Complex Care Management Note Care Guide Note  05/24/2024 Name: [name] MRN: [id number] DOB: Jul 27, 1966   Complex Care Management Outreach Attempts: A second unsuccessful outreach was attempted today to offer the patient with information about available complex care management services.  Follow Up Plan:  Additional outreach attempts will be made to offer the patient complex care management information and services.   Encounter Outcome:  No Answer  Harlene Satterfield  [facility]  [facility], [facility] Guide  Direct Dial: [phone]  Fax [phone]

## 2024-05-25 ENCOUNTER — Ambulatory Visit: Admitting: Podiatry

## 2024-05-25 ENCOUNTER — Encounter: Payer: Self-pay | Admitting: Podiatry

## 2024-05-25 DIAGNOSIS — M205X1 Other deformities of toe(s) (acquired), right foot: Secondary | ICD-10-CM

## 2024-05-25 MED ORDER — TRIAMCINOLONE ACETONIDE 10 MG/ML IJ SUSP
5.0000 mg | Freq: Once | INTRAMUSCULAR | Status: AC
  Administered 2024-05-25: 5 mg

## 2024-05-25 NOTE — Progress Notes (Signed)
 Subjective:  Patient ID: [name], female    DOB: 1965-10-30,  MRN: [id number]  Chief Complaint  Patient presents with   Foot Pain    R 1st toe pain bending or flexing 1st meta the worst.  Wearing closed toe shoe also painful.  Last injection Nov 14, 2023.  Pre diabetic A1c 5.7  No anti coag .having endoscopy 06/04/24     58 y.o. female presents with concern for pain in the right great toe joint.  Known hallux limitus.  [name] last had injection back in February.  [name] reports that it was helpful.  The toe was bothering her again.  We have previously discussed surgery.  [name] is very reluctant to consider this.  Is prediabetic.  Recently diagnosed with rheumatoid arthritis.  Is having endoscopy in a couple of weeks and needing to avoid NSAIDs.   Past Medical History:  Diagnosis Date   Allergic rhinitis    Anginal pain (HCC)    Arthritis    Blood transfusion without reported diagnosis    Cellulitis, face 06/08/2022   Complication of anesthesia    COPD (chronic obstructive pulmonary disease) (HCC)    Dental abscess 06/09/2022   GERD (gastroesophageal reflux disease)    History of colon polyps    History of kidney stones    HTN (hypertension)    Hyperlipidemia    Kidney stones    Pancreatitis    Pneumonia    PONV (postoperative nausea and vomiting)    Primary hypertension 10/20/2021   Psychotic depression (HCC)    Rheumatoid arthritis (HCC) 2024   Shingles February 26th, 2001   Tendinitis    both arms    Allergies  Allergen Reactions   Levofloxacin Other (See Comments) and Rash    Rash all over redness   Bactrim  Ds [Sulfamethoxazole -Trimethoprim ] Nausea Only   Lipitor [Atorvastatin ] Other (See Comments)    Myalgia    Plaquenil  [Hydroxychloroquine ]     Elevated Liver enzymes.    Tape Hives   Zetia [Ezetimibe]     Muscle pain   Codeine Nausea Only   Other Rash    bandaids leave a red area if left on too long    ROS: Negative except as per HPI above  Objective:   General: AAO x3, NAD  Dermatological: With inspection and palpation of the right and left lower extremities there are no open sores, no preulcerative lesions, no rash or signs of infection present. Nails are of normal length thickness and coloration.   Vascular:  Dorsalis Pedis artery and Posterior Tibial artery pedal pulses are 2/4 bilateral.  Capillary fill time < 3 sec to all digits.   Neruologic: Grossly intact via light touch bilateral. Protective threshold intact to all sites bilateral.   Musculoskeletal: Decreased range of motion and pain of the right first MPJ which is made worse with dorsiflexion range of motion.  Worsened with forefoot loading.  Pain on palpation of the right first MPJ.  Gait: Unassisted, Nonantalgic.   No images are attached to the encounter.  Radiographs:  Date: 11/14/2023 XR right foot weightbearing AP/Lateral/Oblique   Findings: Elevated first metatarsal with dorsal spurring present.  Mild bunion deformity.  Some narrowing of the first MPJ joint space noted. Assessment:   1. Hallux limitus of right foot       Plan:  Patient was evaluated and treated and all questions answered.  # Hallux limitus of right foot We discussed the etiology and treatment options for hallux limitus / rigidus including  surgical and non surgical treatment options. We discussed shoe gear changes, orthotics, injection therapy. We also discussed cheilectomy, 1st MTP arthrodesis, and arthroplasty with implants and the rationale for all of these. We discussed that each has advantages/disadvantages, and risks/benefits.  -[name] is adamant about avoiding surgery at this time. -Recommend injection at this time - After verbal consent was obtained proceeded with injection of 0.5 cc half percent Marcaine  plain with 0.5 cc Kenalog  10, 0.5 cc dexamethasone  intra-articular to the first metatarsophalangeal joint after sterile Betadine skin prep and applied sterile adhesive bandage afterwards.   Patient tolerated well -Did discuss that if injections are needed more frequently, may need to give consideration to surgery - Again reiterated that custom orthotics could be worthwhile to help manage pain from the functional mobility.  [name] is unsure about cost with this due to Medicaid.     Return if symptoms worsen or fail to improve.          Loisann Roach L. Lamount, DPM [facility] / [facility]

## 2024-05-25 NOTE — Progress Notes (Signed)
 Complex Care Management Note Care Guide Note  05/25/2024 Name: [name] MRN: [id number] DOB: April 21, 1966   Complex Care Management Outreach Attempts: A third unsuccessful outreach was attempted today to offer the patient with information about available complex care management services.  Follow Up Plan:  No further outreach attempts will be made at this time. We have been unable to contact the patient to offer or enroll patient in complex care management services.  Encounter Outcome:  No Answer  Harlene Satterfield  [facility]  [facility], [facility] Guide  Direct Dial: [phone]  Fax [phone]

## 2024-06-03 ENCOUNTER — Telehealth: Payer: Self-pay | Admitting: Physician Assistant

## 2024-06-03 NOTE — Telephone Encounter (Signed)
 Patient called this morning stating that she needs to cancel her endoscopy which is scheduled for tomorrow 06/04/2024 with Dr. Charlanne  Patient's mother is in the hospital and she is out of town to be with her mom.  Please call her this week to get her rescheduled- thank you

## 2024-06-04 ENCOUNTER — Telehealth: Payer: Self-pay | Admitting: Gastroenterology

## 2024-06-04 ENCOUNTER — Encounter: Admitting: Gastroenterology

## 2024-06-04 NOTE — Telephone Encounter (Signed)
 Good Morning Dr. Charlanne,  This patient canceled her procedure to do her Mother being in the hospital and having to go out of town.  Please advise if you would like me to keep her on the schedule or cancel her off ?    NO SHOW??

## 2024-06-05 NOTE — Telephone Encounter (Signed)
 Called and spoke with patient to reschedule EGD with dilation. Dr. Ira next available appt is not until November. I offered patient a sooner appt with one of Dr. Ira partners. Patient prefers to keep appt with Dr. Charlanne. EGD with dilation has been rescheduled to 07/25/24 arriving at 8:30 am. I have added patient to the cancellation list. Patient is aware that she will receive instructions via MyChart today and will receive a hard copy in the mail. Patient verbalized understanding and had no concerns at the end of the call.

## 2024-06-07 ENCOUNTER — Encounter: Payer: Self-pay | Admitting: Family Medicine

## 2024-06-07 ENCOUNTER — Ambulatory Visit: Admitting: Family Medicine

## 2024-06-07 VITALS — BP 114/64 | HR 98 | Temp 98.4°F | Ht 66.0 in | Wt 173.0 lb

## 2024-06-07 DIAGNOSIS — R6889 Other general symptoms and signs: Secondary | ICD-10-CM

## 2024-06-07 DIAGNOSIS — L304 Erythema intertrigo: Secondary | ICD-10-CM | POA: Diagnosis not present

## 2024-06-07 DIAGNOSIS — M06042 Rheumatoid arthritis without rheumatoid factor, left hand: Secondary | ICD-10-CM | POA: Diagnosis not present

## 2024-06-07 DIAGNOSIS — J301 Allergic rhinitis due to pollen: Secondary | ICD-10-CM | POA: Diagnosis not present

## 2024-06-07 DIAGNOSIS — I201 Angina pectoris with documented spasm: Secondary | ICD-10-CM

## 2024-06-07 DIAGNOSIS — I10 Essential (primary) hypertension: Secondary | ICD-10-CM

## 2024-06-07 DIAGNOSIS — R7303 Prediabetes: Secondary | ICD-10-CM | POA: Diagnosis not present

## 2024-06-07 DIAGNOSIS — E782 Mixed hyperlipidemia: Secondary | ICD-10-CM

## 2024-06-07 DIAGNOSIS — G5602 Carpal tunnel syndrome, left upper limb: Secondary | ICD-10-CM | POA: Diagnosis not present

## 2024-06-07 DIAGNOSIS — F411 Generalized anxiety disorder: Secondary | ICD-10-CM

## 2024-06-07 DIAGNOSIS — M06041 Rheumatoid arthritis without rheumatoid factor, right hand: Secondary | ICD-10-CM

## 2024-06-07 DIAGNOSIS — J410 Simple chronic bronchitis: Secondary | ICD-10-CM

## 2024-06-07 DIAGNOSIS — R062 Wheezing: Secondary | ICD-10-CM

## 2024-06-07 DIAGNOSIS — K219 Gastro-esophageal reflux disease without esophagitis: Secondary | ICD-10-CM

## 2024-06-07 MED ORDER — NITROGLYCERIN 0.4 MG SL SUBL: 0.4000 mg | SUBLINGUAL_TABLET | SUBLINGUAL | 3 refills | Status: AC | PRN

## 2024-06-07 MED ORDER — LEVOCETIRIZINE DIHYDROCHLORIDE 5 MG PO TABS: 5.0000 mg | ORAL_TABLET | Freq: Every evening | ORAL | 0 refills | Status: DC

## 2024-06-07 MED ORDER — ALPRAZOLAM 0.25 MG PO TABS: ORAL_TABLET | ORAL | 1 refills | Status: DC

## 2024-06-07 MED ORDER — NYSTATIN 100000 UNIT/GM EX POWD: 1.0000 | Freq: Three times a day (TID) | CUTANEOUS | 0 refills | Status: AC

## 2024-06-07 MED ORDER — ALBUTEROL SULFATE HFA 108 (90 BASE) MCG/ACT IN AERS: INHALATION_SPRAY | RESPIRATORY_TRACT | 1 refills | Status: AC

## 2024-06-07 MED ORDER — REPATHA SURECLICK 140 MG/ML ~~LOC~~ SOAJ: 140.0000 mg | SUBCUTANEOUS | 2 refills | Status: AC

## 2024-06-07 NOTE — Progress Notes (Signed)
 "  Acute Office Visit  Subjective:    Patient ID: [name], female    DOB: 1966/04/04, 58 y.o.   MRN: [id number]  Chief Complaint  Patient presents with   Hot Flashes    Feels hot all the time and feels her thyroid  needs to be checked.   Rash    Under breast- states she keeps a itchy rash. Applies powder which does not help.   Discussed the use of AI scribe software for clinical note transcription with the patient, who gave verbal consent to proceed.  History of Present Illness [name] is a 58 year old female with rheumatoid arthritis who presents with persistent heat intolerance and joint pain.  Heat intolerance - Persistent episodes of feeling excessively hot, described as whole body burning up - Symptoms are frequent but not constant - No current use of medication for hot flashes - Last thyroid  function evaluation was almost a year ago  Rheumatoid arthritis-associated arthralgia - Rheumatoid arthritis affecting feet and ankles - Significant pain in ankles currently, attributed to arthritis - Currently taking hydroxychloroquine  for rheumatoid arthritis  Upper extremity neuropathy and carpal tunnel syndrome - Pain in left arm - Scheduled for left wrist surgery due to carpal tunnel syndrome and pinched nerve - Numbness and tingling in fingers - Under care of Dr. Teresa for wrist and nerve issues  Esophageal dysmotility and nausea - History of esophageal issues - Previously scheduled for esophageal dilation, canceled due to hospitalization - Persistent nausea managed with Zofran   Intertriginous dermatitis - Persistent itchy rash under breasts - Occasionally uses powder for symptom relief  Prediabetes - Prediabetic, not diabetic  Psychotropic and other chronic medications - Currently taking Wellbutrin  long-term with perceived benefit - xanax  as needed   Familial hyperlipidemia - repatha   Allergic rhinitis:  -  Xyzal , albuterol   Pharyngitis and  cough - Scratchy throat and persistent cough since arrival at clinic    Past Medical History:  Diagnosis Date   Allergic rhinitis    Anginal pain    Arthritis    Blood transfusion without reported diagnosis    Cellulitis, face 06/08/2022   Complication of anesthesia    COPD (chronic obstructive pulmonary disease) (HCC)    Dental abscess 06/09/2022   GERD (gastroesophageal reflux disease)    History of colon polyps    History of kidney stones    HTN (hypertension)    Hyperlipidemia    Kidney stones    Pancreatitis    Pneumonia    PONV (postoperative nausea and vomiting)    Primary hypertension 10/20/2021   Psychotic depression (HCC)    Rheumatoid arthritis (HCC) 2024   Shingles February 26th, 2001   Tendinitis    both arms    Past Surgical History:  Procedure Laterality Date   ABDOMINAL HYSTERECTOMY     BACK SURGERY  02/2023   CARDIAC CATHETERIZATION  2008   CESAREAN SECTION     CHOLECYSTECTOMY     COLONOSCOPY  05/02/2017   Colonic polyp status post polypectomy. Small internal hemorrhoids   FRACTURE SURGERY Bilateral    femur   REPLACEMENT TOTAL KNEE Right 02/09/2019   TUBAL LIGATION      Family History  Problem Relation Age of Onset   Arthritis Mother    Lung disease Mother    Parkinson's disease Mother    COPD Father    Arthritis Father    Healthy Sister    Breast cancer Maternal Aunt  great aunt   Rheum arthritis Maternal Uncle    Rheum arthritis Maternal Grandfather    Healthy Son    High Cholesterol Son    Healthy Son    Colon cancer Cousin        mother's cousin   Diabetes Other    Stroke Other    Hypertension Other    Hyperlipidemia Other    Asthma Other    Heart failure Other    Thyroid  disease Other    Heart attack Other    COPD Other    Arrhythmia Other    Arthritis Other    Migraines Other     Social History   Socioeconomic History   Marital status: Widowed    Spouse name: Not on file   Number of children: 2   Years  of education: Not on file   Highest education level: GED or equivalent  Occupational History   Not on file  Tobacco Use   Smoking status: Every Day    Current packs/day: 1.00    Types: Cigarettes    Passive exposure: Past   Smokeless tobacco: Never   Tobacco comments:    Half a pack.  Vaping Use   Vaping status: Never Used  Substance and Sexual Activity   Alcohol use: Yes    Comment: rarely   Drug use: No   Sexual activity: Not Currently  Other Topics Concern   Not on file  Social History Narrative   Not on file   Social Drivers of Health   Financial Resource Strain: Medium Risk (08/05/2023)   Overall Financial Resource Strain (CARDIA)    Difficulty of Paying Living Expenses: Somewhat hard  Food Insecurity: Food Insecurity Present (08/05/2023)   Hunger Vital Sign    Worried About Running Out of Food in the Last Year: Often true    Ran Out of Food in the Last Year: Sometimes true  Transportation Needs: No Transportation Needs (08/05/2023)   PRAPARE - Administrator, Civil Service (Medical): No    Lack of Transportation (Non-Medical): No  Physical Activity: Inactive (08/05/2023)   Exercise Vital Sign    Days of Exercise per Week: 0 days    Minutes of Exercise per Session: 0 min  Stress: No Stress Concern Present (08/05/2023)   [employer] of Occupational Health - Occupational Stress Questionnaire    Feeling of Stress : Only a little  Social Connections: Moderately Integrated (08/05/2023)   Social Connection and Isolation Panel    Frequency of Communication with Friends and Family: More than three times a week    Frequency of Social Gatherings with Friends and Family: Twice a week    Attends Religious Services: More than 4 times per year    Active Member of [employer] or Organizations: Yes    Attends Banker Meetings: 1 to 4 times per year    Marital Status: Widowed  Intimate Partner Violence: Not At Risk (04/12/2023)   Humiliation, Afraid,  Rape, and Kick questionnaire    Fear of Current or Ex-Partner: No    Emotionally Abused: No    Physically Abused: No    Sexually Abused: No    Outpatient Medications Prior to Visit  Medication Sig Dispense Refill   acetaminophen  (TYLENOL ) 650 MG CR tablet Take 650-1,300 mg by mouth every 8 (eight) hours as needed for pain.     ADVAIR HFA 115-21 MCG/ACT inhaler INHALE 2 PUFFS INTO THE LUNGS TWICE DAILY 36 g 0   amLODipine  (NORVASC )  10 MG tablet TAKE 1 TABLET BY MOUTH DAILY 90 tablet 0   ammonium lactate (LAC-HYDRIN) 12 % lotion Apply 1 Application topically as needed for dry skin.     aspirin  EC 81 MG tablet Take 1 tablet (81 mg total) by mouth daily. Swallow whole.     azelastine  (ASTELIN ) 0.1 % nasal spray Place 2 sprays into both nostrils 2 (two) times daily. Use in each nostril as directed 30 mL 12   buPROPion  (WELLBUTRIN  XL) 300 MG 24 hr tablet TAKE 1 TABLET(300 MG) BY MOUTH DAILY. 90 tablet 1   chlorhexidine  (PERIDEX ) 0.12 % solution Use as directed 5 mLs in the mouth or throat 2 (two) times daily.     Cholecalciferol (VITAMIN D -3 PO) Take by mouth.     cyclobenzaprine  (FLEXERIL ) 10 MG tablet Take 1 tablet (10 mg total) by mouth 3 (three) times daily. 102 tablet 0   dicyclomine  (BENTYL ) 10 MG capsule TAKE 1 CAPSULE(10 MG) BY MOUTH FOUR TIMES DAILY BEFORE MEALS AND AT BEDTIME 120 capsule 2   dicyclomine  (BENTYL ) 20 MG tablet Take 1 tablet (20 mg total) by mouth 2 (two) times daily. 20 tablet 0   Docusate Sodium  (DSS) 100 MG CAPS Take 100 mg by mouth daily as needed (constipation).     famotidine  (PEPCID ) 40 MG tablet Take 1 tablet (40 mg total) by mouth daily as needed for heartburn or indigestion. 90 tablet 1   fluticasone  (FLONASE ) 50 MCG/ACT nasal spray SHAKE LIQUID AND USE 2 SPRAYS IN EACH NOSTRIL DAILY 16 g 6   furosemide  (LASIX ) 20 MG tablet TAKE 1 TABLET(20 MG) BY MOUTH DAILY AS NEEDED FOR SWELLING 30 tablet 2   gabapentin  (NEURONTIN ) 300 MG capsule Take 300 mg by mouth 3 (three)  times daily.     guaiFENesin  (MUCINEX ) 600 MG 12 hr tablet Take 1 tablet (600 mg total) by mouth 2 (two) times daily. 60 tablet 1   HYDROcodone -acetaminophen  (NORCO/VICODIN) 5-325 MG tablet Take 1-2 tablets by mouth every 6 (six) hours as needed.     hydroxychloroquine  (PLAQUENIL ) 200 MG tablet Take 1 tablet 200 mg BID Monday-Friday 120 tablet 0   ipratropium-albuterol  (DUONEB) 0.5-2.5 (3) MG/3ML SOLN Take 3 mLs by nebulization every 6 (six) hours as needed (as needed).     ketoconazole (NIZORAL) 2 % cream Apply 1 Application topically 2 (two) times daily.     lubiprostone  (AMITIZA ) 24 MCG capsule Take 1 capsule (24 mcg total) by mouth 2 (two) times daily with a meal. 60 capsule 5   MAGNESIUM PO Take 500 mg by mouth daily.     meloxicam (MOBIC) 15 MG tablet Take 15 mg by mouth daily.     metFORMIN  (GLUCOPHAGE ) 500 MG tablet Take 1 tablet (500 mg total) by mouth 2 (two) times daily with a meal. 180 tablet 3   montelukast  (SINGULAIR ) 10 MG tablet Take 1 tablet (10 mg total) by mouth at bedtime. 90 tablet 0   Multiple Vitamins-Minerals (MULTIVITAMIN WITH MINERALS) tablet Take 1 tablet by mouth daily.     nicotine  (NICODERM CQ  - DOSED IN MG/24 HOURS) 21 mg/24hr patch APPLY 1 PATCH(21 MG) TOPICALLY TO THE SKIN DAILY 28 patch 1   omeprazole  (PRILOSEC) 40 MG capsule TAKE 1 CAPSULE(40 MG) BY MOUTH DAILY 90 capsule 3   ondansetron  (ZOFRAN -ODT) 4 MG disintegrating tablet Take 1 tablet (4 mg total) by mouth every 8 (eight) hours as needed for nausea or vomiting. DISSOLVE 1 TABLET(4 MG) ON THE TONGUE EVERY 8 HOURS AS NEEDED FOR  NAUSEA OR VOMITING 20 tablet 0   pantoprazole  (PROTONIX ) 40 MG tablet Take 1 tablet (40 mg total) by mouth 2 (two) times daily. 180 tablet 1   Semaglutide , 2 MG/DOSE, (OZEMPIC , 2 MG/DOSE,) 8 MG/3ML SOPN Inject into the skin.     Semaglutide , 2 MG/DOSE, 8 MG/3ML SOPN Inject 2 mg as directed once a week. 9 mL 1   triamcinolone  (KENALOG ) 0.025 % ointment Apply 1 Application topically 2  (two) times daily. Apply to corners of mouth twice daily 30 g 0   TURMERIC PO Take 1,000 mg by mouth daily. With ginger     VITAMIN E PO Take by mouth.     albuterol  (VENTOLIN  HFA) 108 (90 Base) MCG/ACT inhaler INHALE 2 PUFFS INTO LUNGS EVERY 6 TO 8 HOURS AS NEEDED FOR WHEEZING 18 g 1   ALPRAZolam  (XANAX ) 0.25 MG tablet TAKE 1 TABLET(0.25 MG) BY MOUTH DAILY AS NEEDED FOR ANXIETY 30 tablet 1   levocetirizine (XYZAL ) 5 MG tablet Take 1 tablet (5 mg total) by mouth every evening. 90 tablet 0   nitroGLYCERIN  (NITROSTAT ) 0.4 MG SL tablet Place 1 tablet (0.4 mg total) under the tongue every 5 (five) minutes as needed for chest pain. 50 tablet 3   pantoprazole  (PROTONIX ) 20 MG tablet Take 1 tablet (20 mg total) by mouth daily for 14 days. 14 tablet 0   REPATHA  SURECLICK 140 MG/ML SOAJ ADMINISTER 1 ML(140MG ) UNDER THE SKIN EVERY 14 DAYS AS DIRECTED 2 mL 2   No facility-administered medications prior to visit.    Allergies  Allergen Reactions   Levofloxacin Other (See Comments) and Rash    Rash all over redness   Bactrim  Ds [Sulfamethoxazole -Trimethoprim ] Nausea Only   Lipitor [Atorvastatin ] Other (See Comments)    Myalgia    Plaquenil  [Hydroxychloroquine ]     Elevated Liver enzymes.    Tape Hives   Zetia [Ezetimibe]     Muscle pain   Codeine Nausea Only   Other Rash    bandaids leave a red area if left on too long    Review of Systems  All other systems reviewed and are negative.      Objective:        06/07/2024   10:33 AM 04/18/2024    9:36 AM 04/11/2024    1:49 PM  Vitals with BMI  Height 5' 6 5' 6 5' 6  Weight 173 lbs 174 lbs 175 lbs  BMI 27.94 28.1 28.26  Systolic 114 118 874  Diastolic 64 80 70  Pulse 98 94 78    No data found.   Physical Exam Vitals reviewed.  Constitutional:      Appearance: Normal appearance.  HENT:     Right Ear: Tympanic membrane, ear canal and external ear normal.     Left Ear: Tympanic membrane, ear canal and external ear normal.      Nose: Nose normal.     Mouth/Throat:     Pharynx: Oropharynx is clear.  Cardiovascular:     Rate and Rhythm: Normal rate and regular rhythm.     Heart sounds: Normal heart sounds. No murmur heard. Pulmonary:     Effort: Pulmonary effort is normal. No respiratory distress.     Breath sounds: Normal breath sounds.  Lymphadenopathy:     Cervical: No cervical adenopathy.  Skin:    Findings: Rash (intertrigo) present.  Neurological:     Mental Status: She is alert and oriented to person, place, and time.  Psychiatric:  Mood and Affect: Mood normal.        Behavior: Behavior normal.     Health Maintenance Due  Topic Date Due   Hepatitis B Vaccines 19-59 Average Risk (1 of 3 - 19+ 3-dose series) Never done       Topic Date Due   Hepatitis B Vaccines 19-59 Average Risk (1 of 3 - 19+ 3-dose series) Never done     Lab Results  Component Value Date   TSH 1.650 06/07/2024   Lab Results  Component Value Date   WBC 5.9 06/07/2024   HGB 13.2 06/07/2024   HCT 40.0 06/07/2024   MCV 94 06/07/2024   PLT 296 06/07/2024   Lab Results  Component Value Date   NA 140 06/07/2024   K 4.4 06/07/2024   CO2 26 06/07/2024   GLUCOSE 89 06/07/2024   BUN 9 06/07/2024   CREATININE 0.46 (L) 06/07/2024   BILITOT 0.2 06/07/2024   ALKPHOS 113 06/07/2024   AST 15 06/07/2024   ALT 19 06/07/2024   PROT 6.5 06/07/2024   ALBUMIN 4.4 06/07/2024   CALCIUM  9.7 06/07/2024   ANIONGAP 10 04/03/2024   EGFR 111 06/07/2024   Lab Results  Component Value Date   CHOL 214 (H) 06/07/2024   Lab Results  Component Value Date   HDL 61 06/07/2024   Lab Results  Component Value Date   LDLCALC 135 (H) 06/07/2024   Lab Results  Component Value Date   TRIG 101 06/07/2024   Lab Results  Component Value Date   CHOLHDL 3.5 06/07/2024   Lab Results  Component Value Date   HGBA1C 5.4 06/07/2024       Assessment & Plan:  Rheumatoid arthritis involving both hands with negative rheumatoid  factor (HCC) Assessment & Plan: - Continue hydroxychloroquine . - Order CBC, liver, and kidney function tests to monitor hydroxychloroquine  effects.   Carpal tunnel syndrome of left wrist Assessment & Plan: - Proceed with planned surgery with Dr. Teresa for nerve decompression.   Intertrigo Assessment & Plan: - Prescribe nystatin  powder for intertriginous rash.  Orders: -     Nystatin ; Apply 1 Application topically 3 (three) times daily.  Dispense: 15 g; Refill: 0  Heat intolerance Assessment & Plan: - Order thyroid  function tests. - Consider non-hormonal treatment for hot flashes, such as Veozah, if thyroid  function is normal.  Orders: -     CBC with Differential/Platelet -     Comprehensive metabolic panel with GFR -     T4, free -     TSH  Seasonal allergic rhinitis due to pollen Assessment & Plan: Fairly well controlled.  - Continue Xyzal .   Orders: -     Levocetirizine Dihydrochloride ; Take 1 tablet (5 mg total) by mouth every evening.  Dispense: 90 tablet; Refill: 0  GAD (generalized anxiety disorder) Assessment & Plan: Fairly well controlled  Continue wellbutrin  and xanax .   Orders: -     ALPRAZolam ; TAKE 1 TABLET(0.25 MG) BY MOUTH DAILY AS NEEDED FOR ANXIETY  Dispense: 30 tablet; Refill: 1  Primary hypertension Assessment & Plan: Well controlled.  No changes to medicines. Amlodipine  10 mg daily, furosemide  20 mg as needed    Mixed hyperlipidemia Assessment & Plan: Worsened. Not quite at goal No changes to medicines.  Intolerant to lipitor and zetia. Taking Repatha  140 mg every 14 days. Continue to work on eating a healthy diet and exercise.    Orders: -     Nitroglycerin ; Place 1 tablet (0.4 mg total) under  the tongue every 5 (five) minutes as needed for chest pain.  Dispense: 50 tablet; Refill: 3 -     Repatha  SureClick; Inject 140 mg into the skin every 14 (fourteen) days.  Dispense: 2 mL; Refill: 2 -     Lipid panel  Gastroesophageal reflux  disease without esophagitis Assessment & Plan: Well controlled.  Continue omeprazole  40 mg daily.  Continue pepcid  40 mg daily as needed.   Prediabetes Assessment & Plan: Stable.  - Continue Ozempic  2 mg injection SQ weekly   Orders: -     Hemoglobin A1c  Simple chronic bronchitis (HCC) Assessment & Plan: Improved with singulair  use.  Managed with Advair and albuterol .  Orders: -     Albuterol  Sulfate HFA; INHALE 2 PUFFS INTO LUNGS EVERY 6 TO 8 HOURS AS NEEDED FOR WHEEZING  Dispense: 18 g; Refill: 1    Meds ordered this encounter  Medications   nitroGLYCERIN  (NITROSTAT ) 0.4 MG SL tablet    Sig: Place 1 tablet (0.4 mg total) under the tongue every 5 (five) minutes as needed for chest pain.    Dispense:  50 tablet    Refill:  3   levocetirizine (XYZAL ) 5 MG tablet    Sig: Take 1 tablet (5 mg total) by mouth every evening.    Dispense:  90 tablet    Refill:  0    ZERO refills remain on this prescription. Your patient is requesting advance approval of refills for this medication to PREVENT ANY MISSED DOSES   Evolocumab  (REPATHA  SURECLICK) 140 MG/ML SOAJ    Sig: Inject 140 mg into the skin every 14 (fourteen) days.    Dispense:  2 mL    Refill:  2    ZERO refills remain on this prescription. Your patient is requesting advance approval of refills for this medication to PREVENT ANY MISSED DOSES   ALPRAZolam  (XANAX ) 0.25 MG tablet    Sig: TAKE 1 TABLET(0.25 MG) BY MOUTH DAILY AS NEEDED FOR ANXIETY    Dispense:  30 tablet    Refill:  1   albuterol  (VENTOLIN  HFA) 108 (90 Base) MCG/ACT inhaler    Sig: INHALE 2 PUFFS INTO LUNGS EVERY 6 TO 8 HOURS AS NEEDED FOR WHEEZING    Dispense:  18 g    Refill:  1   nystatin  (MYCOSTATIN /NYSTOP ) powder    Sig: Apply 1 Application topically 3 (three) times daily.    Dispense:  15 g    Refill:  0    Orders Placed This Encounter  Procedures   CBC with Differential/Platelet   Comprehensive metabolic panel with GFR   T4, free   TSH    Lipid panel   Hemoglobin A1c     Follow-up: Return in about 1 month (around 07/10/2024).  I,Marla I Leal-Borjas,acting as a scribe for Abigail Free, MD.,have documented all relevant documentation on the behalf of Abigail Free, MD,as directed by  Abigail Free, MD while in the presence of Abigail Free, MD.   An After Visit Summary was printed and given to the patient.  I attest that I have reviewed this visit and agree with the plan scribed by my staff.   Abigail Free, MD [facility] [phone]    "

## 2024-06-08 LAB — COMPREHENSIVE METABOLIC PANEL WITH GFR
ALT: 19 IU/L (ref 0–32)
AST: 15 IU/L (ref 0–40)
Albumin: 4.4 g/dL (ref 3.8–4.9)
Alkaline Phosphatase: 113 IU/L (ref 49–135)
BUN/Creatinine Ratio: 20 (ref 9–23)
BUN: 9 mg/dL (ref 6–24)
Bilirubin Total: 0.2 mg/dL (ref 0.0–1.2)
CO2: 26 mmol/L (ref 20–29)
Calcium: 9.7 mg/dL (ref 8.7–10.2)
Chloride: 101 mmol/L (ref 96–106)
Creatinine, Ser: 0.46 mg/dL — ABNORMAL LOW (ref 0.57–1.00)
Globulin, Total: 2.1 g/dL (ref 1.5–4.5)
Glucose: 89 mg/dL (ref 70–99)
Potassium: 4.4 mmol/L (ref 3.5–5.2)
Sodium: 140 mmol/L (ref 134–144)
Total Protein: 6.5 g/dL (ref 6.0–8.5)
eGFR: 111 mL/min/1.73 (ref >59)

## 2024-06-08 LAB — CBC WITH DIFFERENTIAL/PLATELET
Basophils Absolute: 0 x10E3/uL (ref 0.0–0.2)
Basos: 1 %
EOS (ABSOLUTE): 0.2 x10E3/uL (ref 0.0–0.4)
Eos: 3 %
Hematocrit: 40 % (ref 34.0–46.6)
Hemoglobin: 13.2 g/dL (ref 11.1–15.9)
Immature Grans (Abs): 0 x10E3/uL (ref 0.0–0.1)
Immature Granulocytes: 0 %
Lymphocytes Absolute: 1.5 x10E3/uL (ref 0.7–3.1)
Lymphs: 25 %
MCH: 31.1 pg (ref 26.6–33.0)
MCHC: 33 g/dL (ref 31.5–35.7)
MCV: 94 fL (ref 79–97)
Monocytes Absolute: 0.4 x10E3/uL (ref 0.1–0.9)
Monocytes: 8 %
Neutrophils Absolute: 3.7 x10E3/uL (ref 1.4–7.0)
Neutrophils: 63 %
Platelets: 296 x10E3/uL (ref 150–450)
RBC: 4.24 x10E6/uL (ref 3.77–5.28)
RDW: 12.2 % (ref 11.7–15.4)
WBC: 5.9 x10E3/uL (ref 3.4–10.8)

## 2024-06-08 LAB — HEMOGLOBIN A1C
Est. average glucose Bld gHb Est-mCnc: 108 mg/dL
Hgb A1c MFr Bld: 5.4 % (ref 4.8–5.6)

## 2024-06-08 LAB — LIPID PANEL
Chol/HDL Ratio: 3.5 ratio (ref 0.0–4.4)
Cholesterol, Total: 214 mg/dL — ABNORMAL HIGH (ref 100–199)
HDL: 61 mg/dL (ref >39)
LDL Chol Calc (NIH): 135 mg/dL — ABNORMAL HIGH (ref 0–99)
Triglycerides: 101 mg/dL (ref 0–149)
VLDL Cholesterol Cal: 18 mg/dL (ref 5–40)

## 2024-06-08 LAB — TSH: TSH: 1.65 u[IU]/mL (ref 0.450–4.500)

## 2024-06-08 LAB — T4, FREE: Free T4: 1.27 ng/dL (ref 0.82–1.77)

## 2024-06-10 ENCOUNTER — Ambulatory Visit: Payer: Self-pay | Admitting: Family Medicine

## 2024-06-10 DIAGNOSIS — G5602 Carpal tunnel syndrome, left upper limb: Secondary | ICD-10-CM | POA: Insufficient documentation

## 2024-06-10 DIAGNOSIS — R6889 Other general symptoms and signs: Secondary | ICD-10-CM | POA: Insufficient documentation

## 2024-06-10 DIAGNOSIS — L304 Erythema intertrigo: Secondary | ICD-10-CM | POA: Insufficient documentation

## 2024-06-10 NOTE — Assessment & Plan Note (Signed)
-   Prescribe nystatin  powder for intertriginous rash.

## 2024-06-10 NOTE — Assessment & Plan Note (Signed)
-   Proceed with planned surgery with Dr. Gramic for nerve decompression.

## 2024-06-10 NOTE — Assessment & Plan Note (Signed)
-   Continue hydroxychloroquine . - Order CBC, liver, and kidney function tests to monitor hydroxychloroquine  effects.

## 2024-06-10 NOTE — Assessment & Plan Note (Signed)
-   Order thyroid  function tests. - Consider non-hormonal treatment for hot flashes, such as Veozah, if thyroid  function is normal.

## 2024-06-12 DIAGNOSIS — M67979 Unspecified disorder of synovium and tendon, unspecified ankle and foot: Secondary | ICD-10-CM | POA: Insufficient documentation

## 2024-06-12 DIAGNOSIS — M6788 Other specified disorders of synovium and tendon, other site: Secondary | ICD-10-CM | POA: Diagnosis not present

## 2024-06-12 DIAGNOSIS — M79671 Pain in right foot: Secondary | ICD-10-CM | POA: Diagnosis not present

## 2024-06-12 NOTE — Assessment & Plan Note (Signed)
 Worsened. Not quite at goal No changes to medicines.  Intolerant to lipitor and zetia. Taking Repatha  140 mg every 14 days. Continue to work on eating a healthy diet and exercise.

## 2024-06-12 NOTE — Assessment & Plan Note (Signed)
 Fairly well controlled  Continue wellbutrin  and xanax .

## 2024-06-12 NOTE — Assessment & Plan Note (Signed)
 Well controlled.  Continue omeprazole  40 mg daily.  Continue pepcid  40 mg daily as needed.

## 2024-06-12 NOTE — Assessment & Plan Note (Signed)
 Improved with singulair  use.  Managed with Advair and albuterol .

## 2024-06-12 NOTE — Assessment & Plan Note (Signed)
 Fairly well controlled.  - Continue Xyzal .

## 2024-06-12 NOTE — Assessment & Plan Note (Signed)
 Well controlled.  No changes to medicines. Amlodipine  10 mg daily, furosemide  20 mg as needed

## 2024-06-12 NOTE — Assessment & Plan Note (Signed)
 Stable.  - Continue Ozempic  2 mg injection SQ weekly

## 2024-06-15 DIAGNOSIS — M5432 Sciatica, left side: Secondary | ICD-10-CM | POA: Diagnosis not present

## 2024-06-18 ENCOUNTER — Other Ambulatory Visit: Payer: Self-pay | Admitting: Neurosurgery

## 2024-06-18 DIAGNOSIS — M5432 Sciatica, left side: Secondary | ICD-10-CM

## 2024-06-19 ENCOUNTER — Other Ambulatory Visit: Payer: Self-pay | Admitting: Family Medicine

## 2024-06-19 ENCOUNTER — Other Ambulatory Visit: Payer: Self-pay

## 2024-06-19 DIAGNOSIS — F17219 Nicotine dependence, cigarettes, with unspecified nicotine-induced disorders: Secondary | ICD-10-CM

## 2024-06-19 MED ORDER — NICOTINE 21 MG/24HR TD PT24: 21.0000 mg | MEDICATED_PATCH | Freq: Every day | TRANSDERMAL | 1 refills | Status: DC

## 2024-06-29 ENCOUNTER — Other Ambulatory Visit

## 2024-07-03 ENCOUNTER — Ambulatory Visit (HOSPITAL_COMMUNITY): Admit: 2024-07-03 | Admitting: Orthopedic Surgery

## 2024-07-03 DIAGNOSIS — G5602 Carpal tunnel syndrome, left upper limb: Secondary | ICD-10-CM | POA: Diagnosis not present

## 2024-07-03 DIAGNOSIS — G5622 Lesion of ulnar nerve, left upper limb: Secondary | ICD-10-CM | POA: Diagnosis not present

## 2024-07-03 SURGERY — RELEASE, CARPAL TUNNEL AND CUBITAL TUNNEL
Anesthesia: Regional | Laterality: Left

## 2024-07-04 NOTE — Progress Notes (Deleted)
 Office Visit Note  Patient: [name]             Date of Birth: 1966-08-31           MRN: [id number]             PCP: Sherre Clapper, MD Referring: Sherre Clapper, MD Visit Date: 07/18/2024 Occupation: Data Unavailable  Subjective:  No chief complaint on file.   History of Present Illness: BENTLI LLORENTE is a 58 y.o. female ***     Activities of Daily Living:  Patient reports morning stiffness for *** {minute/hour:19697}.   Patient {ACTIONS;DENIES/REPORTS:21021675::Denies} nocturnal pain.  Difficulty dressing/grooming: {ACTIONS;DENIES/REPORTS:21021675::Denies} Difficulty climbing stairs: {ACTIONS;DENIES/REPORTS:21021675::Denies} Difficulty getting out of chair: {ACTIONS;DENIES/REPORTS:21021675::Denies} Difficulty using hands for taps, buttons, cutlery, and/or writing: {ACTIONS;DENIES/REPORTS:21021675::Denies}  No Rheumatology ROS completed.   PMFS History:  Patient Active Problem List   Diagnosis Date Noted   Carpal tunnel syndrome of left wrist 06/10/2024   Intertrigo 06/10/2024   Heat intolerance 06/10/2024   Arthropathy of lumbar facet joint 02/06/2024   Numbness and tingling sensation of skin 02/06/2024   COPD exacerbation (HCC) 12/23/2023   Sinus headache 12/14/2023   Chronic idiopathic constipation 12/13/2023   Right sided abdominal pain 12/13/2023   Irritable bowel syndrome with constipation 12/13/2023   Numbness of lower limb 12/06/2023   Rheumatoid arthritis (HCC) 11/27/2023   Chronic allergic rhinitis 11/26/2023   Elevated liver enzymes 10/25/2023   Acute right flank pain 10/25/2023   Frequency of urination 10/25/2023   Wheezing 10/05/2023   Opacities of both lungs present on chest x-ray 10/05/2023   Non-seasonal allergic rhinitis due to pollen 09/08/2023   Bilateral elbow joint pain 08/28/2023   Tinnitus of left ear 08/17/2023   Osteoarthritis of right elbow 07/14/2023   Pain in joint of right shoulder 07/14/2023   Seasonal allergic  rhinitis due to pollen 06/16/2023   Left wrist pain 06/02/2023   Degenerative scoliosis in adult patient 02/28/2023   Aspirin -like platelet function defect (HCC) 01/13/2023   Dental caries 01/04/2023   History of blood transfusion 01/04/2023   Failure of dental implant due to infection 01/04/2023   Lumbar back pain 09/14/2022   Aortic atherosclerosis 05/08/2022   Chronic midline low back pain without sciatica 01/22/2022   Myalgia due to statin 01/22/2022   GERD (gastroesophageal reflux disease) 01/22/2022   Overweight with body mass index (BMI) of 28 to 28.9 in adult 12/31/2021   Prediabetes 10/20/2021   Migraine 10/20/2021   Primary hypertension 10/20/2021   DDD (degenerative disc disease), lumbar 06/17/2020   Neuropathy 04/08/2020   Simple chronic bronchitis (HCC) 01/06/2020   Cigarette nicotine  dependence with nicotine -induced disorder 01/01/2020   GAD (generalized anxiety disorder) 01/01/2020   Mixed hyperlipidemia 06/05/2019   Smoking 06/05/2019   Prinzmetal angina 06/05/2019   Tobacco abuse 06/05/2019   Osteoarthritis of left knee 02/09/2019   Pain of left hip joint 12/19/2018   Unilateral primary osteoarthritis of first carpometacarpal joint, right hand 11/20/2018   Right leg pain 01/29/2013   Chest pain 08/31/2011    Past Medical History:  Diagnosis Date   Allergic rhinitis    Anginal pain    Arthritis    Blood transfusion without reported diagnosis    Cellulitis, face 06/08/2022   Complication of anesthesia    COPD (chronic obstructive pulmonary disease) (HCC)    Dental abscess 06/09/2022   GERD (gastroesophageal reflux disease)    History of colon polyps    History of kidney stones    HTN (hypertension)  Hyperlipidemia    Kidney stones    Pancreatitis    Pneumonia    PONV (postoperative nausea and vomiting)    Primary hypertension 10/20/2021   Psychotic depression (HCC)    Rheumatoid arthritis (HCC) 2024   Shingles February 26th, 2001   Tendinitis     both arms    Family History  Problem Relation Age of Onset   Arthritis Mother    Lung disease Mother    Parkinson's disease Mother    COPD Father    Arthritis Father    Healthy Sister    Breast cancer Maternal Aunt        great aunt   Rheum arthritis Maternal Uncle    Rheum arthritis Maternal Grandfather    Healthy Son    High Cholesterol Son    Healthy Son    Colon cancer Cousin        mother's cousin   Diabetes Other    Stroke Other    Hypertension Other    Hyperlipidemia Other    Asthma Other    Heart failure Other    Thyroid  disease Other    Heart attack Other    COPD Other    Arrhythmia Other    Arthritis Other    Migraines Other    Past Surgical History:  Procedure Laterality Date   ABDOMINAL HYSTERECTOMY     BACK SURGERY  02/2023   CARDIAC CATHETERIZATION  2008   CESAREAN SECTION     CHOLECYSTECTOMY     COLONOSCOPY  05/02/2017   Colonic polyp status post polypectomy. Small internal hemorrhoids   FRACTURE SURGERY Bilateral    femur   REPLACEMENT TOTAL KNEE Right 02/09/2019   TUBAL LIGATION     Social History   Tobacco Use   Smoking status: Every Day    Current packs/day: 1.00    Types: Cigarettes    Passive exposure: Past   Smokeless tobacco: Never   Tobacco comments:    Half a pack.  Vaping Use   Vaping status: Never Used  Substance Use Topics   Alcohol use: Yes    Comment: rarely   Drug use: No   Social History   Social History Narrative   Not on file     Immunization History  Administered Date(s) Administered   Influenza,inj,Quad PF,6+ Mos 06/27/2018   Influenza-Unspecified 07/08/2017, 08/09/2022   PNEUMOCOCCAL CONJUGATE-20 05/06/2022   Tdap 12/01/2021   Zoster Recombinant(Shingrix) 09/14/2017, 05/05/2018     Objective: Vital Signs: LMP  (LMP Unknown) Comment: hysterectomy 2008   Physical Exam   Musculoskeletal Exam: ***  CDAI Exam: CDAI Score: -- Patient Global: --; Provider Global: -- Swollen: --; Tender: -- Joint  Exam 07/18/2024   No joint exam has been documented for this visit   There is currently no information documented on the homunculus. Go to the Rheumatology activity and complete the homunculus joint exam.  Investigation: No additional findings.  Imaging: No results found.  Recent Labs: Lab Results  Component Value Date   WBC 5.9 06/07/2024   HGB 13.2 06/07/2024   PLT 296 06/07/2024   NA 140 06/07/2024   K 4.4 06/07/2024   CL 101 06/07/2024   CO2 26 06/07/2024   GLUCOSE 89 06/07/2024   BUN 9 06/07/2024   CREATININE 0.46 (L) 06/07/2024   BILITOT 0.2 06/07/2024   ALKPHOS 113 06/07/2024   AST 15 06/07/2024   ALT 19 06/07/2024   PROT 6.5 06/07/2024   ALBUMIN 4.4 06/07/2024   CALCIUM  9.7 06/07/2024  GFRAA 122 10/28/2020    Speciality Comments: PLQ Eye Exam: 09/28/2023 WNL @ [facility] follow up in 1 year.   Procedures:  No procedures performed Allergies: Levofloxacin, Bactrim  ds [sulfamethoxazole -trimethoprim ], Lipitor [atorvastatin ], Plaquenil  [hydroxychloroquine ], Tape, Zetia [ezetimibe], Codeine, and Other   Assessment / Plan:     Visit Diagnoses: No diagnosis found.  Orders: No orders of the defined types were placed in this encounter.  No orders of the defined types were placed in this encounter.   Face-to-face time spent with patient was *** minutes. Greater than 50% of time was spent in counseling and coordination of care.  Follow-Up Instructions: No follow-ups on file.   Daved JAYSON Gavel, CMA  Note - This record has been created using Animal nutritionist.  Chart creation errors have been sought, but may not always  have been located. Such creation errors do not reflect on  the standard of medical care.

## 2024-07-05 ENCOUNTER — Other Ambulatory Visit

## 2024-07-09 ENCOUNTER — Encounter: Payer: Self-pay | Admitting: Neurosurgery

## 2024-07-10 ENCOUNTER — Ambulatory Visit: Admitting: Family Medicine

## 2024-07-11 ENCOUNTER — Ambulatory Visit (INDEPENDENT_AMBULATORY_CARE_PROVIDER_SITE_OTHER)

## 2024-07-11 DIAGNOSIS — Z23 Encounter for immunization: Secondary | ICD-10-CM

## 2024-07-16 NOTE — Discharge Instructions (Signed)

## 2024-07-17 ENCOUNTER — Other Ambulatory Visit

## 2024-07-17 ENCOUNTER — Inpatient Hospital Stay
Admission: RE | Admit: 2024-07-17 | Discharge: 2024-07-17 | Disposition: A | Discharge status: Home / Self Care | Source: Ambulatory Visit | Attending: Neurosurgery | Admitting: Neurosurgery

## 2024-07-18 ENCOUNTER — Ambulatory Visit: Admitting: Rheumatology

## 2024-07-18 DIAGNOSIS — M7712 Lateral epicondylitis, left elbow: Secondary | ICD-10-CM

## 2024-07-18 DIAGNOSIS — M19041 Primary osteoarthritis, right hand: Secondary | ICD-10-CM

## 2024-07-18 DIAGNOSIS — M19071 Primary osteoarthritis, right ankle and foot: Secondary | ICD-10-CM

## 2024-07-18 DIAGNOSIS — J301 Allergic rhinitis due to pollen: Secondary | ICD-10-CM

## 2024-07-18 DIAGNOSIS — Z720 Tobacco use: Secondary | ICD-10-CM

## 2024-07-18 DIAGNOSIS — K219 Gastro-esophageal reflux disease without esophagitis: Secondary | ICD-10-CM

## 2024-07-18 DIAGNOSIS — M51369 Other intervertebral disc degeneration, lumbar region without mention of lumbar back pain or lower extremity pain: Secondary | ICD-10-CM

## 2024-07-18 DIAGNOSIS — Q6672 Congenital pes cavus, left foot: Secondary | ICD-10-CM

## 2024-07-18 DIAGNOSIS — I10 Essential (primary) hypertension: Secondary | ICD-10-CM

## 2024-07-18 DIAGNOSIS — Z96651 Presence of right artificial knee joint: Secondary | ICD-10-CM

## 2024-07-18 DIAGNOSIS — Z8261 Family history of arthritis: Secondary | ICD-10-CM

## 2024-07-18 DIAGNOSIS — Z840 Family history of diseases of the skin and subcutaneous tissue: Secondary | ICD-10-CM

## 2024-07-18 DIAGNOSIS — G43809 Other migraine, not intractable, without status migrainosus: Secondary | ICD-10-CM

## 2024-07-18 DIAGNOSIS — R7989 Other specified abnormal findings of blood chemistry: Secondary | ICD-10-CM

## 2024-07-18 DIAGNOSIS — R7303 Prediabetes: Secondary | ICD-10-CM

## 2024-07-18 DIAGNOSIS — I700 Atherosclerosis of aorta: Secondary | ICD-10-CM

## 2024-07-18 DIAGNOSIS — M7120 Synovial cyst of popliteal space [Baker], unspecified knee: Secondary | ICD-10-CM

## 2024-07-18 DIAGNOSIS — M0600 Rheumatoid arthritis without rheumatoid factor, unspecified site: Secondary | ICD-10-CM

## 2024-07-18 DIAGNOSIS — F411 Generalized anxiety disorder: Secondary | ICD-10-CM

## 2024-07-18 DIAGNOSIS — M7061 Trochanteric bursitis, right hip: Secondary | ICD-10-CM

## 2024-07-18 DIAGNOSIS — Z8709 Personal history of other diseases of the respiratory system: Secondary | ICD-10-CM

## 2024-07-18 DIAGNOSIS — E782 Mixed hyperlipidemia: Secondary | ICD-10-CM

## 2024-07-18 DIAGNOSIS — Z79899 Other long term (current) drug therapy: Secondary | ICD-10-CM

## 2024-07-18 DIAGNOSIS — M4126 Other idiopathic scoliosis, lumbar region: Secondary | ICD-10-CM

## 2024-07-18 NOTE — Progress Notes (Unsigned)
 Office Visit Note  Patient: [name]             Date of Birth: 07-02-1966           MRN: [id number]             PCP: Sherre Clapper, MD Referring: Sherre Clapper, MD Visit Date: 08/01/2024 Occupation: Data Unavailable  Subjective:  Pain in hands at night    History of Present Illness: HUMA IMHOFF is a 58 y.o. female with history of seronegative rheumatoid arthritis and osteoarthritis. Patient remains on plaquenil  200 mg 1 tablet by mouth twice daily Monday through Friday.  She has been tolerating Plaquenil  without any side effects.  Patient underwent ulnar tunnel release and carpal tunnel release with Dr. Camella recently.  Patient states the surgery was successful.  She states she held plaquenil  around surgery but has otherwise not had any gaps in therapy.  She has noticed about 50% improvement in her symptoms since reinitiating Plaquenil .  Patient has continued to take meloxicam 15 mg daily and Norco for pain relief.  She continues to experience intermittent discomfort in both hands and both feet especially at night.  She has occasional inflammation in her hands.    Activities of Daily Living:  Patient reports morning stiffness for 1 hour.   Patient Reports nocturnal pain.  Difficulty dressing/grooming: Denies Difficulty climbing stairs: Reports Difficulty getting out of chair: Denies Difficulty using hands for taps, buttons, cutlery, and/or writing: Reports  Review of Systems  Constitutional:  Negative for fatigue.  HENT:  Negative for mouth sores and mouth dryness.   Eyes:  Positive for dryness.  Respiratory:  Negative for shortness of breath.   Cardiovascular:  Negative for chest pain and palpitations.  Gastrointestinal:  Positive for constipation. Negative for blood in stool and diarrhea.  Endocrine: Negative for increased urination.  Genitourinary:  Negative for involuntary urination.  Musculoskeletal:  Positive for joint pain, gait problem, joint pain, joint  swelling, myalgias, muscle weakness, morning stiffness, muscle tenderness and myalgias.  Skin:  Positive for color change. Negative for rash, hair loss and sensitivity to sunlight.  Allergic/Immunologic: Negative for susceptible to infections.  Neurological:  Negative for dizziness and headaches.  Hematological:  Negative for swollen glands.  Psychiatric/Behavioral:  Positive for depressed mood and sleep disturbance. The patient is nervous/anxious.     PMFS History:  Patient Active Problem List   Diagnosis Date Noted   Abrasion of right lower leg 07/19/2024   Carpal tunnel syndrome of left wrist 06/10/2024   Intertrigo 06/10/2024   Heat intolerance 06/10/2024   Arthropathy of lumbar facet joint 02/06/2024   Numbness and tingling sensation of skin 02/06/2024   COPD exacerbation (HCC) 12/23/2023   Sinus headache 12/14/2023   Chronic idiopathic constipation 12/13/2023   Right sided abdominal pain 12/13/2023   Irritable bowel syndrome with constipation 12/13/2023   Numbness of lower limb 12/06/2023   Rheumatoid arthritis (HCC) 11/27/2023   Chronic allergic rhinitis 11/26/2023   Elevated liver enzymes 10/25/2023   Acute right flank pain 10/25/2023   Frequency of urination 10/25/2023   Wheezing 10/05/2023   Opacities of both lungs present on chest x-ray 10/05/2023   Non-seasonal allergic rhinitis due to pollen 09/08/2023   Bilateral elbow joint pain 08/28/2023   Tinnitus of left ear 08/17/2023   Osteoarthritis of right elbow 07/14/2023   Pain in joint of right shoulder 07/14/2023   Seasonal allergic rhinitis due to pollen 06/16/2023   Left wrist pain 06/02/2023  Degenerative scoliosis in adult patient 02/28/2023   Aspirin -like platelet function defect (HCC) 01/13/2023   Dental caries 01/04/2023   History of blood transfusion 01/04/2023   Failure of dental implant due to infection 01/04/2023   Lumbar back pain 09/14/2022   Aortic atherosclerosis 05/08/2022   Chronic midline low  back pain without sciatica 01/22/2022   Myalgia due to statin 01/22/2022   GERD (gastroesophageal reflux disease) 01/22/2022   Overweight with body mass index (BMI) of 28 to 28.9 in adult 12/31/2021   Prediabetes 10/20/2021   Migraine 10/20/2021   Primary hypertension 10/20/2021   DDD (degenerative disc disease), lumbar 06/17/2020   Neuropathy 04/08/2020   Simple chronic bronchitis (HCC) 01/06/2020   Cigarette nicotine  dependence with nicotine -induced disorder 01/01/2020   GAD (generalized anxiety disorder) 01/01/2020   Mixed hyperlipidemia 06/05/2019   Smoking 06/05/2019   Prinzmetal angina 06/05/2019   Tobacco abuse 06/05/2019   Osteoarthritis of left knee 02/09/2019   Pain of left hip joint 12/19/2018   Unilateral primary osteoarthritis of first carpometacarpal joint, right hand 11/20/2018   Right leg pain 01/29/2013   Chest pain 08/31/2011    Past Medical History:  Diagnosis Date   Allergic rhinitis    Allergy    Anginal pain    Arthritis    Blood transfusion without reported diagnosis    Cellulitis, face 06/08/2022   Complication of anesthesia    COPD (chronic obstructive pulmonary disease) (HCC)    Dental abscess 06/09/2022   Diabetes mellitus without complication (HCC)    GERD (gastroesophageal reflux disease)    History of colon polyps    History of kidney stones    HTN (hypertension)    Hyperlipidemia    Kidney stones    Osteoporosis    Pancreatitis    Pneumonia    PONV (postoperative nausea and vomiting)    Primary hypertension 10/20/2021   Psychotic depression (HCC)    Rheumatoid arthritis (HCC) 2024   Shingles February 26th, 2001   Tendinitis    both arms    Family History  Problem Relation Age of Onset   Arthritis Mother    Lung disease Mother    Parkinson's disease Mother    COPD Father    Arthritis Father    Healthy Sister    Breast cancer Maternal Aunt        great aunt   Rheum arthritis Maternal Uncle    Rheum arthritis Maternal  Grandfather    Healthy Son    High Cholesterol Son    Healthy Son    Colon cancer Cousin        mother's cousin   Diabetes Other    Stroke Other    Hypertension Other    Hyperlipidemia Other    Asthma Other    Heart failure Other    Thyroid  disease Other    Heart attack Other    COPD Other    Arrhythmia Other    Arthritis Other    Migraines Other    Past Surgical History:  Procedure Laterality Date   ABDOMINAL HYSTERECTOMY     BACK SURGERY  02/2023   CARDIAC CATHETERIZATION  2008   CESAREAN SECTION     CHOLECYSTECTOMY     COLONOSCOPY  05/02/2017   Colonic polyp status post polypectomy. Small internal hemorrhoids   FRACTURE SURGERY Bilateral    femur   MINOR CARPAL TUNNEL Right 2025   3 weeks ago   REPLACEMENT TOTAL KNEE Right 02/09/2019   TUBAL LIGATION  ULNAR TUNNEL RELEASE  2025   3 weeks ago   Social History   Tobacco Use   Smoking status: Every Day    Current packs/day: 1.00    Types: Cigarettes    Passive exposure: Past   Smokeless tobacco: Never   Tobacco comments:    Half a pack.  Vaping Use   Vaping status: Never Used  Substance Use Topics   Alcohol use: Yes    Comment: rarely   Drug use: No   Social History   Social History Narrative   Not on file     Immunization History  Administered Date(s) Administered   Influenza,inj,Quad PF,6+ Mos 06/27/2018   Influenza,trivalent, recombinat, inj, PF 07/11/2024   Influenza-Unspecified 07/08/2017, 08/09/2022   PNEUMOCOCCAL CONJUGATE-20 05/06/2022   Tdap 12/01/2021   Zoster Recombinant(Shingrix) 09/14/2017, 05/05/2018     Objective: Vital Signs: BP 132/81   Pulse 85   Temp 97.7 F (36.5 C)   Resp 16   Ht 5' 6 (1.676 m)   Wt 174 lb 9.6 oz (79.2 kg)   LMP  (LMP Unknown) Comment: hysterectomy 2008  BMI 28.18 kg/m    Physical Exam Vitals and nursing note reviewed.  Constitutional:      Appearance: She is well-developed.  HENT:     Head: Normocephalic and atraumatic.  Eyes:      Conjunctiva/sclera: Conjunctivae normal.  Cardiovascular:     Rate and Rhythm: Normal rate and regular rhythm.     Heart sounds: Normal heart sounds.  Pulmonary:     Effort: Pulmonary effort is normal.     Breath sounds: Normal breath sounds.  Abdominal:     General: Bowel sounds are normal.     Palpations: Abdomen is soft.  Musculoskeletal:     Cervical back: Normal range of motion.  Lymphadenopathy:     Cervical: No cervical adenopathy.  Skin:    General: Skin is warm and dry.     Capillary Refill: Capillary refill takes less than 2 seconds.  Neurological:     Mental Status: She is alert and oriented to person, place, and time.  Psychiatric:        Behavior: Behavior normal.      Musculoskeletal Exam: C-spine, thoracic spine, lumbar spine have good range of motion.  No midline spinal tenderness.  No SI joint tenderness.  Shoulder joints have good range of motion.  Some discomfort with extension of the right elbow.  Wrist joints have good range of motion with no tenderness or synovitis.  Synovial thickening of the right 2nd and 3rd MCP joints.  PIP and DIP thickening noted.  Complete fist formation bilaterally.  Hip joints have good range of motion with no groin pain.  Knee joints have good range of motion no warmth or effusion.  Tenderness of both ankles noted.    CDAI Exam: CDAI Score: -- Patient Global: --; Provider Global: -- Swollen: --; Tender: -- Joint Exam 08/01/2024   No joint exam has been documented for this visit   There is currently no information documented on the homunculus. Go to the Rheumatology activity and complete the homunculus joint exam.  Investigation: No additional findings.  Imaging: No results found.  Recent Labs: Lab Results  Component Value Date   WBC 5.9 06/07/2024   HGB 13.2 06/07/2024   PLT 296 06/07/2024   NA 140 06/07/2024   K 4.4 06/07/2024   CL 101 06/07/2024   CO2 26 06/07/2024   GLUCOSE 89 06/07/2024   BUN 9 06/07/2024  CREATININE 0.46 (L) 06/07/2024   BILITOT 0.2 06/07/2024   ALKPHOS 113 06/07/2024   AST 15 06/07/2024   ALT 19 06/07/2024   PROT 6.5 06/07/2024   ALBUMIN 4.4 06/07/2024   CALCIUM  9.7 06/07/2024   GFRAA 122 10/28/2020    Speciality Comments: PLQ Eye Exam: 09/28/2023 WNL @ [facility] follow up in 1 year.   Procedures:  No procedures performed Allergies: Levofloxacin, Lipitor [atorvastatin ], Zetia [ezetimibe], Bactrim  ds [sulfamethoxazole -trimethoprim ], Codeine, Other, and Tape   Assessment / Plan:     Visit Diagnoses: Seronegative rheumatoid arthritis (HCC) - Anti-CCP negative, RF negative, ESR within normal limits on 07/12/2023, ultrasound of both hands positive for synovitis on 08/04/2023: Patient is taking Plaquenil  200 mg 1 tablet by mouth twice daily Monday to Friday.  She is tolerating Plaquenil  without any side effects.  Plaquenil  was restarted after the last office visit in July 2025.  She has noticed about a 50% improvement in her symptoms since reinitiating Plaquenil .  She continues to have intermittent discomfort in her hands and feet especially at night.  She is been taking meloxicam 15 mg 1 tablet daily and Norco for pain relief.  She does not want to make any medication changes at this time. Patient would like to have an ultrasound of both hands performed at her next follow-up visit in 3 months to assess for active inflammation.  She would like to remain on Plaquenil  as monotherapy for now.  She will notify us  if she develops any new or worsening symptoms between now and then.  High risk medication use - Plaquenil  200 mg 1 tablet by mouth twice daily Monday through Friday.   CBC and CMP updated on 06/07/24.  She will be due updated lab work in February 2026. PLQ Eye Exam: 09/28/2023 WNL @ [facility] follow up in 1 year.  Reminded patient to schedule an updated Plaquenil  eye examination.   Primary osteoarthritis of both hands: She has PIP and DIP thickening  consistent with osteoarthritis of both hands.  She continues to have persistent discomfort in the left [facility] joint. She has been under the care of Dr. Camella.  She is taken meloxicam 15 mg daily and Norco for pain relief. Plan to schedule ultrasound of both hands at the next office visit to assess for synovitis as requested.  She would like to remain on Plaquenil  as prescribed for now.  Lateral epicondylitis of both elbows: She underwent left ulnar nerve release recently which was performed by Dr. Michalene. She has some discomfort and stiffness with full extension of the right elbow.  Tenderness of the lateral epicondyle of the right elbow noted.   Synovial cyst of popliteal space, unspecified laterality: Not currently symptomatic.   Status post total knee replacement, right - 2020 right total knee replacement by Dr. Georgina.  No effusion noted.  Pes cavus of both feet: She will likely be getting orthotics.   Primary osteoarthritis of both feet: She continues to have intermittent discomfort in both feet especially at night.  Patient is considering proceeding with orthotics as recommended by orthopedics.  Degeneration of intervertebral disc of lumbar region without discogenic back pain or lower extremity pain - S/p laminectomy September 20, 2022.PLIF L3-L4, L4-L5 by Dr. Mavis February 28, 2023. Chronic pain.  She takes Norco for pain relief.  Trochanteric bursitis of both hips: Intermittent discomfort.   Other idiopathic scoliosis, lumbar region  Other medical conditions are listed as follows:  Primary hypertension: Blood pressure was 132/81 today in the  office.  History of COPD  Aortic atherosclerosis  Mixed hyperlipidemia  Prediabetes  Tobacco abuse  Gastroesophageal reflux disease without esophagitis  GAD (generalized anxiety disorder)  Other migraine without status migrainosus, not intractable  Seasonal allergic rhinitis due to pollen  Family history of rheumatoid  arthritis-maternal uncle and maternal grandfather  Family history of psoriasis-son and granddaughter  Orders: No orders of the defined types were placed in this encounter.  Meds ordered this encounter  Medications   hydroxychloroquine  (PLAQUENIL ) 200 MG tablet    Sig: Take 1 tablet 200 mg BID Monday-Friday    Dispense:  120 tablet    Refill:  0     Follow-Up Instructions: Return in about 3 months (around 11/01/2024) for Rheumatoid arthritis, Osteoarthritis.   Waddell CHRISTELLA Craze, PA-C  Note - This record has been created using Dragon software.  Chart creation errors have been sought, but may not always  have been located. Such creation errors do not reflect on  the standard of medical care.

## 2024-07-19 ENCOUNTER — Ambulatory Visit: Admitting: Family Medicine

## 2024-07-19 ENCOUNTER — Ambulatory Visit: Payer: Self-pay

## 2024-07-19 VITALS — BP 100/60 | HR 87 | Temp 97.6°F | Resp 16 | Ht 66.0 in | Wt 173.0 lb

## 2024-07-19 DIAGNOSIS — S80811A Abrasion, right lower leg, initial encounter: Secondary | ICD-10-CM

## 2024-07-19 MED ORDER — MUPIROCIN 2 % EX OINT: 1.0000 | TOPICAL_OINTMENT | Freq: Two times a day (BID) | CUTANEOUS | 0 refills | Status: AC

## 2024-07-19 NOTE — Progress Notes (Signed)
 Acute Office Visit  Subjective:     Patient ID: [name], female    DOB: 1966-06-24, 58 y.o.   MRN: [id number]  No chief complaint on file.   Discussed the use of AI scribe software for clinical note transcription with the patient, who gave verbal consent to proceed.  History of Present Illness   [name] is a 58 year old female who presents with a painful leg wound after a recent injury.  Lower extremity wound and pain - Painful wound on the leg for 2 days following puncture injury from a stick while stepping over a log in the woods - Significant throbbing pain localized around the wound - Pain worsened recently after initial improvement with topical antibiotic - No fever or purulent drainage - Concern for possible infection - Wound has not scabbed over; skin came off after using a Band-Aid with neosporin in it. - Pain exacerbated by use of large Band-Aid with Neosporin and home remedy of baking soda and vinegar, which caused additional discomfort  Wound management and interventions - Initial wound care with topical antibiotic provided by her mother - Use of regular and large Band-Aids with Neosporin - Attempted home remedy with baking soda and vinegar, resulting in increased discomfort  Pain management - Hydrocodone  used for pain control, remaining from previous back pain and recent left carpel tunnel surgery  Immunization status - Tetanus vaccination in 2023       Review of Systems  Constitutional:  Negative for chills and fever.  Respiratory:  Negative for shortness of breath.   Cardiovascular:  Negative for chest pain and leg swelling.  Musculoskeletal:  Positive for myalgias (right calf).  Skin:        Bruising/wound        Objective:    BP 100/60   Pulse 87   Temp 97.6 F (36.4 C)   Resp 16   Ht 5' 6 (1.676 m)   Wt 173 lb (78.5 kg)   LMP  (LMP Unknown) Comment: hysterectomy 2008  SpO2 97%   BMI 27.92 kg/m    Physical  Exam Vitals reviewed.  Constitutional:      General: She is not in acute distress.    Appearance: Normal appearance. She is not ill-appearing.  Eyes:     Conjunctiva/sclera: Conjunctivae normal.  Cardiovascular:     Rate and Rhythm: Normal rate and regular rhythm.     Heart sounds: Normal heart sounds. No murmur heard. Pulmonary:     Effort: Pulmonary effort is normal. No respiratory distress.     Breath sounds: Normal breath sounds. No wheezing.  Musculoskeletal:        General: Normal range of motion.     Cervical back: Normal range of motion.  Skin:    Findings: Abrasion, erythema and signs of injury present.  Neurological:     Mental Status: She is alert. Mental status is at baseline.  Psychiatric:        Mood and Affect: Mood normal.        Behavior: Behavior normal.           Assessment & Plan:   Problem List Items Addressed This Visit       Musculoskeletal and Integument   Abrasion of right lower leg - Primary   Lower leg abrasion Abrasion healing with skin irritation from Band-Aid. Possible allergic reaction to Neosporin. - Prescribed Bactroban  for abrasion. - Advised removal of Band-Aid at night for airing. - Instructed to monitor  for infection signs and return if present.      Relevant Medications   mupirocin  ointment (BACTROBAN ) 2 %     Meds ordered this encounter  Medications   mupirocin  ointment (BACTROBAN ) 2 %    Sig: Apply 1 Application topically 2 (two) times daily.    Dispense:  22 g    Refill:  0    Follow-up:Return if symptoms worsen or fail to improve.  Harrie Cedar, FNP [facility] [phone]

## 2024-07-19 NOTE — Telephone Encounter (Signed)
 FYI Only or Action Required?: Action required by provider: clinical question for provider.  Patient is questioning if she needs a tetanus shot. Asking for a call back from office,   Patient was last seen in primary care on 06/07/2024 by Sherre Clapper, MD.  Called Nurse Triage reporting Leg Pain.  Symptoms began a week ago.  Interventions attempted: Rest, hydration, or home remedies and Other: treated the wound area with OTC options.  Symptoms are: stable.  Triage Disposition: See PCP When Office is Open (Within 3 Days), Home Care  Patient/caregiver understands and will follow disposition?: No, wishes to speak with PCP  Copied from CRM #[id number]. Topic: Clinical - Red Word Triage >> Jul 19, 2024  8:30 AM [place] MATSU wrote: Red Word that prompted transfer to Nurse Triage: pain in back leg due to jumping over a piece of log. Reason for Disposition  Leg pain  [1] Last tetanus shot > 10 years ago AND [2] CLEAN cut or scrape (e.g., object AND skin were clean)  Answer Assessment - Initial Assessment Questions Patient reports she was out in the woods a week ago and cut the back of right leg on the limb of dried out log. No swelling but slight redness. Mild pain. Patient is questioning if she is needing a tetanus shot.   1. ONSET: When did the pain start?      Pain started a week ago after patient hurt her leg while stepping over a large piece of wood in the woods.  2. LOCATION: Where is the pain located?      Back of right leg 3. PAIN: How bad is the pain?    (Scale 1-10; or mild, moderate, severe)     mild 4. WORK OR EXERCISE: Has there been any recent work or exercise that involved this part of the body?      no 5. CAUSE: What do you think is causing the leg pain?     Cut her leg in the woods stepping over a dried out log 6. OTHER SYMPTOMS: Do you have any other symptoms? (e.g., chest pain, back pain, breathing difficulty, swelling, rash, fever, numbness, weakness)      no  Answer Assessment - Initial Assessment Questions 1. APPEARANCE What does the injury look like?      Describes a scrap to the back of right leg  2. ONSET: How long ago did the injury occur?      A week ago 3. LOCATION: Where is the injury located?      Back of right leg-calf area 4. SIZE: How large is the cut?      Patient couldn't describe 5. BLEEDING: Is it bleeding now? If Yes, ask: Is it difficult to stop?      no 6. PAIN: Is there any pain? If Yes, ask: How bad is the pain? (Scale 0-10; or none, mild, moderate, severe)     Mild 7. MECHANISM: Tell me how it happened.      Stepped over a large dried out log and cut her leg. Patient is questioning if she needs a tetanus shot 8. TETANUS: When was your last tetanus booster?     Over 10 years ago  Protocols used: Leg Pain-A-AH, Skin Injury-A-AH

## 2024-07-19 NOTE — Assessment & Plan Note (Signed)
 Lower leg abrasion Abrasion healing with skin irritation from Band-Aid. Possible allergic reaction to Neosporin. - Prescribed Bactroban  for abrasion. - Advised removal of Band-Aid at night for airing. - Instructed to monitor for infection signs and return if present.

## 2024-07-24 MED ORDER — CIPROFLOXACIN HCL 500 MG PO TABS: 500.0000 mg | ORAL_TABLET | Freq: Two times a day (BID) | ORAL | 0 refills | Status: DC

## 2024-07-25 ENCOUNTER — Ambulatory Visit (AMBULATORY_SURGERY_CENTER): Admitting: Gastroenterology

## 2024-07-25 ENCOUNTER — Encounter: Payer: Self-pay | Admitting: Gastroenterology

## 2024-07-25 VITALS — BP 119/71 | HR 81 | Temp 97.4°F | Resp 13 | Ht 66.0 in | Wt 172.0 lb

## 2024-07-25 DIAGNOSIS — K219 Gastro-esophageal reflux disease without esophagitis: Secondary | ICD-10-CM

## 2024-07-25 DIAGNOSIS — K209 Esophagitis, unspecified without bleeding: Secondary | ICD-10-CM

## 2024-07-25 DIAGNOSIS — K2970 Gastritis, unspecified, without bleeding: Secondary | ICD-10-CM | POA: Diagnosis not present

## 2024-07-25 DIAGNOSIS — K3189 Other diseases of stomach and duodenum: Secondary | ICD-10-CM

## 2024-07-25 DIAGNOSIS — R1310 Dysphagia, unspecified: Secondary | ICD-10-CM

## 2024-07-25 DIAGNOSIS — R1319 Other dysphagia: Secondary | ICD-10-CM

## 2024-07-25 DIAGNOSIS — K319 Disease of stomach and duodenum, unspecified: Secondary | ICD-10-CM | POA: Diagnosis not present

## 2024-07-25 DIAGNOSIS — K449 Diaphragmatic hernia without obstruction or gangrene: Secondary | ICD-10-CM

## 2024-07-25 MED ORDER — DEXTROSE 5 % IV SOLN: INTRAVENOUS | Status: AC

## 2024-07-25 MED ORDER — SODIUM CHLORIDE 0.9 % IV SOLN: 500.0000 mL | INTRAVENOUS | Status: DC

## 2024-07-25 NOTE — Progress Notes (Signed)
 Sedate, gd SR, tolerated procedure well, VSS, report to RN

## 2024-07-25 NOTE — Patient Instructions (Signed)
 Please read handouts provided. Dilation Diet. Continue present medications. Stop Smoking. Await pathology results.   YOU HAD AN ENDOSCOPIC PROCEDURE TODAY AT THE [facility]:   Refer to the procedure report that was given to you for any specific questions about what was found during the examination.  If the procedure report does not answer your questions, please call your gastroenterologist to clarify.  If you requested that your care partner not be given the details of your procedure findings, then the procedure report has been included in a sealed envelope for you to review at your convenience later.  YOU SHOULD EXPECT: Some feelings of bloating in the abdomen. Passage of more gas than usual.  Walking can help get rid of the air that was put into your GI tract during the procedure and reduce the bloating. If you had a lower endoscopy (such as a colonoscopy or flexible sigmoidoscopy) you may notice spotting of blood in your stool or on the toilet paper. If you underwent a bowel prep for your procedure, you may not have a normal bowel movement for a few days.  Please Note:  You might notice some irritation and congestion in your nose or some drainage.  This is from the oxygen used during your procedure.  There is no need for concern and it should clear up in a day or so.  SYMPTOMS TO REPORT IMMEDIATELY:  Following upper endoscopy (EGD)  Vomiting of blood or coffee ground material  New chest pain or pain under the shoulder blades  Painful or persistently difficult swallowing  New shortness of breath  Fever of 100F or higher  Black, tarry-looking stools  For urgent or emergent issues, a gastroenterologist can be reached at any hour by calling [phone]. Do not use MyChart messaging for urgent concerns.    DIET:   Drink plenty of fluids but you should avoid alcoholic beverages for 24 hours.  ACTIVITY:  You should plan to take it easy for the rest of today and you should  NOT DRIVE or use heavy machinery until tomorrow (because of the sedation medicines used during the test).    FOLLOW UP: Our staff will call the number listed on your records the next business day following your procedure.  We will call around 7:15- 8:00 am to check on you and address any questions or concerns that you may have regarding the information given to you following your procedure. If we do not reach you, we will leave a message.     If any biopsies were taken you will be contacted by phone or by letter within the next 1-3 weeks.  Please call us  at [phone] if you have not heard about the biopsies in 3 weeks.    SIGNATURES/CONFIDENTIALITY: You and/or your care partner have signed paperwork which will be entered into your electronic medical record.  These signatures attest to the fact that that the information above on your After Visit Summary has been reviewed and is understood.  Full responsibility of the confidentiality of this discharge information lies with you and/or your care-partner.

## 2024-07-25 NOTE — Op Note (Signed)
 [facility] Patient Name: [name] Procedure Date: 07/25/2024 8:49 AM MRN: [id number] Endoscopist: Lynnie Bring , MD, [id number] Age: 58 Referring MD:  Date of Birth: 09-20-1966 Gender: Female Account #: [id number] Procedure:                Upper GI endoscopy Indications:              Dysphagia barium swallow showing small HH, ?                            cervical web Medicines:                Monitored Anesthesia Care Procedure:                Pre-Anesthesia Assessment:                           - Prior to the procedure, a History and Physical                            was performed, and patient medications and                            allergies were reviewed. The patient's tolerance of                            previous anesthesia was also reviewed. The risks                            and benefits of the procedure and the sedation                            options and risks were discussed with the patient.                            All questions were answered, and informed consent                            was obtained. Prior Anticoagulants: The patient has                            taken no anticoagulant or antiplatelet agents. ASA                            Grade Assessment: II - A patient with mild systemic                            disease. After reviewing the risks and benefits,                            the patient was deemed in satisfactory condition to                            undergo the procedure.  After obtaining informed consent, the endoscope was                            passed under direct vision. Throughout the                            procedure, the patient's blood pressure, pulse, and                            oxygen saturations were monitored continuously. The                            Olympus Scope [id number] was introduced through the                            mouth, and advanced to the second part of  duodenum.                            The upper GI endoscopy was accomplished without                            difficulty. The patient tolerated the procedure                            well. Scope In: Scope Out: Findings:                 Localized, white plaques were found in the upper                            third of the esophagus. Biopsies were taken with a                            cold forceps for histology. Biopsies were also                            performed from mid and distal esophagus to rule out                            EoE. No cervical web. The scope was withdrawn.                            Dilation was performed with a Maloney dilator with                            mild resistance at 50 Fr and 52 Fr.                           The Z-line was regular and was found 38 cm from the                            incisors.  A 1 cm transient hiatal hernia was present.                           Localized mild inflammation characterized by                            erythema was found in the gastric antrum. Biopsies                            were taken with a cold forceps for histology.                           The examined duodenum was normal. Complications:            No immediate complications. Estimated Blood Loss:     Estimated blood loss: none. Impression:               - Esophageal plaques were found, suspicious for                            candidiasis. Biopsied. Dilated.                           - Z-line regular, 38 cm from the incisors.                           - Minimal hiatal hernia.                           - Gastritis. Biopsied.                           - Normal examined duodenum. Recommendation:           - Patient has a contact number available for                            emergencies. The signs and symptoms of potential                            delayed complications were discussed with the                             patient. Return to normal activities tomorrow.                            Written discharge instructions were provided to the                            patient.                           - Post dil diet.                           - Continue present medications.                           -  Stop smoking.                           - Await pathology results.                           - The findings and recommendations were discussed                            with the patient's family. Lynnie Bring, MD 07/25/2024 9:34:19 AM This report has been signed electronically.

## 2024-07-25 NOTE — Progress Notes (Signed)
 Chief Complaint: Dysphagia  Referring Provider:  Sherre Clapper, MD      ASSESSMENT AND PLAN;   #1. GERD with globus sensation/eso dysphagia  #2. H/O colonic polyps 04/2017.  Repeat colonoscopy due 04/2022. She wants to hold off  Plan: - Protonix  40mg  po bid #60, 6 refills.  - Zofran  4mg  ODT Q8hrs prn #20 - Ba Swallow with tab - EGD with dil. - Hold ozempic  1 week before endocopy - I have instructed her that she needs to chew food specially meats and breads well and eat slowly. - Avoid NSAIDs. - She will let us  know when she decides to get colonoscopy performed.    Ba Swallow: IMPRESSION: 1. Tiny sliding hiatal hernia.  No gastroesophageal reflux elicited. 2. Possible tiny cervical esophageal web in the anterior upper cervical esophagus. 3. Otherwise normal esophagram. No evidence of esophageal stricture HPI:    [name] is a 58 y.o. female  History of Present Illness [name] is a 58 year old female who presents with difficulty swallowing and throat pain.  She experiences severe dysphagia, describing an inability to swallow even water, accompanied by significant odynophagia localized to her throat, which worsens with swallowing. This episode prompted a visit to the emergency room yesterday, where she was administered Maalox, lidocaine  to swallow, and a GI cocktail, which provided some relief. She was also prescribed Protonix , which she had taken previously but stopped due to cost. She has since resumed it after obtaining Medicaid coverage.  She has a history of esophageal issues, including a previous esophageal candidiasis diagnosed in 2021. She experiences hiccups and occasional choking when eating, which she attributes to possible esophageal narrowing. No recent antibiotic use. She experiences frequent heartburn and takes Protonix  twice daily. She also takes Bentyl  10 mg three times a day, and was recently given a higher dose of 20 mg for 14 days.  She reports  occasional nausea and vomiting, with a recent episode of feeling like she was going to vomit but did not. She has a history of constipation and diarrhea, for which she recently started taking psyllium husk, resulting in diarrhea followed by constipation. She recalls a history of gallstones and pancreatitis following an ERCP procedure.  Her current medications include Tylenol , meloxicam for arthritis, and Ozempic  for diabetes management. She also takes Arnica and bioflavonoids, which she started recently.  Has history of sinus problems - didn't want to have Sx-seen by Dr West/Dr Honor in the past.  Past GI [facility]  EGD 11/2019 - Esophageal plaques were found, consistent with candidiasis. ( brushed and biopsied) .  Treated with Diflucan .  Colon 04/2017 ([facility]) -Colon polyp s/p polypectomy. Bx-TA. Rpt 5 yrs - Wants to hold off.  SHe does understand risks and benefits including risks of missing colorectal neoplasms.  Past Medical History:  Diagnosis Date   Allergic rhinitis    Allergy    Anginal pain    Arthritis    Blood transfusion without reported diagnosis    Cellulitis, face 06/08/2022   Complication of anesthesia    COPD (chronic obstructive pulmonary disease) (HCC)    Dental abscess 06/09/2022   Diabetes mellitus without complication (HCC)    GERD (gastroesophageal reflux disease)    History of colon polyps    History of kidney stones    HTN (hypertension)    Hyperlipidemia    Kidney stones    Osteoporosis    Pancreatitis    Pneumonia    PONV (postoperative nausea and vomiting)  Primary hypertension 10/20/2021   Psychotic depression (HCC)    Rheumatoid arthritis [facility]) 2024   Shingles February 26th, 2001   Tendinitis    both arms    Past Surgical History:  Procedure Laterality Date   ABDOMINAL HYSTERECTOMY     BACK SURGERY  02/2023   CARDIAC CATHETERIZATION  2008   CESAREAN SECTION     CHOLECYSTECTOMY     COLONOSCOPY  05/02/2017   Colonic polyp status post polypectomy.  Small internal hemorrhoids   FRACTURE SURGERY Bilateral    femur   REPLACEMENT TOTAL KNEE Right 02/09/2019   TUBAL LIGATION      Family History  Problem Relation Age of Onset   Arthritis Mother    Lung disease Mother    Parkinson's disease Mother    COPD Father    Arthritis Father    Healthy Sister    Breast cancer Maternal Aunt        great aunt   Rheum arthritis Maternal Uncle    Rheum arthritis Maternal Grandfather    Healthy Son    High Cholesterol Son    Healthy Son    Colon cancer Cousin        mother's cousin   Diabetes Other    Stroke Other    Hypertension Other    Hyperlipidemia Other    Asthma Other    Heart failure Other    Thyroid  disease Other    Heart attack Other    COPD Other    Arrhythmia Other    Arthritis Other    Migraines Other     Social History   Tobacco Use   Smoking status: Every Day    Current packs/day: 1.00    Types: Cigarettes    Passive exposure: Past   Smokeless tobacco: Never   Tobacco comments:    Half a pack.  Vaping Use   Vaping status: Never Used  Substance Use Topics   Alcohol use: Yes    Comment: rarely   Drug use: No    Current Outpatient Medications  Medication Sig Dispense Refill   acetaminophen  (TYLENOL ) 650 MG CR tablet Take 650-1,300 mg by mouth every 8 (eight) hours as needed for pain.     ADVAIR HFA 115-21 MCG/ACT inhaler INHALE 2 PUFFS INTO THE LUNGS TWICE DAILY 36 g 0   albuterol  (VENTOLIN  HFA) 108 (90 Base) MCG/ACT inhaler INHALE 2 PUFFS INTO LUNGS EVERY 6 TO 8 HOURS AS NEEDED FOR WHEEZING 18 g 1   ALPRAZolam  (XANAX ) 0.25 MG tablet TAKE 1 TABLET(0.25 MG) BY MOUTH DAILY AS NEEDED FOR ANXIETY 30 tablet 1   amLODipine  (NORVASC ) 10 MG tablet TAKE 1 TABLET BY MOUTH DAILY 90 tablet 0   ammonium lactate (LAC-HYDRIN) 12 % lotion Apply 1 Application topically as needed for dry skin.     aspirin  EC 81 MG tablet Take 1 tablet (81 mg total) by mouth daily. Swallow whole.     azelastine  (ASTELIN ) 0.1 % nasal spray  Place 2 sprays into both nostrils 2 (two) times daily. Use in each nostril as directed 30 mL 12   buPROPion  (WELLBUTRIN  XL) 300 MG 24 hr tablet TAKE 1 TABLET(300 MG) BY MOUTH DAILY. 90 tablet 1   chlorhexidine  (PERIDEX ) 0.12 % solution Use as directed 5 mLs in the mouth or throat 2 (two) times daily.     Cholecalciferol (VITAMIN D -3 PO) Take by mouth.     ciprofloxacin (CIPRO) 500 MG tablet Take 1 tablet (500 mg total) by mouth 2 (two)  times daily for 7 days. 14 tablet 0   cyclobenzaprine  (FLEXERIL ) 10 MG tablet Take 1 tablet (10 mg total) by mouth 3 (three) times daily. 102 tablet 0   dicyclomine  (BENTYL ) 10 MG capsule TAKE 1 CAPSULE(10 MG) BY MOUTH FOUR TIMES DAILY BEFORE MEALS AND AT BEDTIME 120 capsule 2   dicyclomine  (BENTYL ) 20 MG tablet Take 1 tablet (20 mg total) by mouth 2 (two) times daily. 20 tablet 0   Docusate Sodium  (DSS) 100 MG CAPS Take 100 mg by mouth daily as needed (constipation).     Evolocumab  (REPATHA  SURECLICK) 140 MG/ML SOAJ Inject 140 mg into the skin every 14 (fourteen) days. 2 mL 2   famotidine  (PEPCID ) 40 MG tablet Take 1 tablet (40 mg total) by mouth daily as needed for heartburn or indigestion. 90 tablet 1   fluticasone  (FLONASE ) 50 MCG/ACT nasal spray SHAKE LIQUID AND USE 2 SPRAYS IN EACH NOSTRIL DAILY 16 g 6   furosemide  (LASIX ) 20 MG tablet TAKE 1 TABLET(20 MG) BY MOUTH DAILY AS NEEDED FOR SWELLING 30 tablet 2   gabapentin  (NEURONTIN ) 300 MG capsule Take 300 mg by mouth 3 (three) times daily.     guaiFENesin  (MUCINEX ) 600 MG 12 hr tablet Take 1 tablet (600 mg total) by mouth 2 (two) times daily. 60 tablet 1   HYDROcodone -acetaminophen  (NORCO/VICODIN) 5-325 MG tablet Take 1-2 tablets by mouth every 6 (six) hours as needed.     hydroxychloroquine  (PLAQUENIL ) 200 MG tablet Take 1 tablet 200 mg BID Monday-Friday 120 tablet 0   ipratropium-albuterol  (DUONEB) 0.5-2.5 (3) MG/3ML SOLN Take 3 mLs by nebulization every 6 (six) hours as needed (as needed).     ketoconazole  (NIZORAL) 2 % cream Apply 1 Application topically 2 (two) times daily.     levocetirizine (XYZAL ) 5 MG tablet Take 1 tablet (5 mg total) by mouth every evening. 90 tablet 0   lubiprostone  (AMITIZA ) 24 MCG capsule Take 1 capsule (24 mcg total) by mouth 2 (two) times daily with a meal. 60 capsule 5   MAGNESIUM PO Take 500 mg by mouth daily.     meloxicam (MOBIC) 15 MG tablet Take 15 mg by mouth daily.     metFORMIN  (GLUCOPHAGE ) 500 MG tablet Take 1 tablet (500 mg total) by mouth 2 (two) times daily with a meal. 180 tablet 3   montelukast  (SINGULAIR ) 10 MG tablet Take 1 tablet (10 mg total) by mouth at bedtime. 90 tablet 0   Multiple Vitamins-Minerals (MULTIVITAMIN WITH MINERALS) tablet Take 1 tablet by mouth daily.     mupirocin  ointment (BACTROBAN ) 2 % Apply 1 Application topically 2 (two) times daily. 22 g 0   nicotine  (NICODERM CQ  - DOSED IN MG/24 HOURS) 21 mg/24hr patch Place 1 patch (21 mg total) onto the skin daily. 28 patch 1   nitroGLYCERIN  (NITROSTAT ) 0.4 MG SL tablet Place 1 tablet (0.4 mg total) under the tongue every 5 (five) minutes as needed for chest pain. 50 tablet 3   nystatin  (MYCOSTATIN /NYSTOP ) powder Apply 1 Application topically 3 (three) times daily. 15 g 0   omeprazole  (PRILOSEC) 40 MG capsule TAKE 1 CAPSULE(40 MG) BY MOUTH DAILY 90 capsule 3   ondansetron  (ZOFRAN -ODT) 4 MG disintegrating tablet Take 1 tablet (4 mg total) by mouth every 8 (eight) hours as needed for nausea or vomiting. DISSOLVE 1 TABLET(4 MG) ON THE TONGUE EVERY 8 HOURS AS NEEDED FOR NAUSEA OR VOMITING 20 tablet 0   pantoprazole  (PROTONIX ) 40 MG tablet Take 1 tablet (40 mg total)  by mouth 2 (two) times daily. 180 tablet 1   Semaglutide , 2 MG/DOSE, (OZEMPIC , 2 MG/DOSE,) 8 MG/3ML SOPN Inject into the skin.     Semaglutide , 2 MG/DOSE, 8 MG/3ML SOPN Inject 2 mg as directed once a week. 9 mL 1   triamcinolone  (KENALOG ) 0.025 % ointment Apply 1 Application topically 2 (two) times daily. Apply to corners of mouth twice  daily 30 g 0   TURMERIC PO Take 1,000 mg by mouth daily. With ginger     VITAMIN E PO Take by mouth.     Current Facility-Administered Medications  Medication Dose Route Frequency Provider Last Rate Last Admin   0.9 %  sodium chloride  infusion  500 mL Intravenous Continuous Charlanne Groom, MD       dextrose  5 % solution   Intravenous Continuous Charlanne Groom, MD        Allergies  Allergen Reactions   Levofloxacin Other (See Comments) and Rash    Rash all over redness   Bactrim  Ds [Sulfamethoxazole -Trimethoprim ] Nausea Only   Lipitor [Atorvastatin ] Other (See Comments)    Myalgia    Zetia [Ezetimibe]     Muscle pain   Codeine Nausea Only   Other Rash    bandaids leave a red area if left on too long   Tape Hives    Review of Systems:  Constitutional: Denies fever, chills, diaphoresis, appetite change and fatigue.  HEENT: Denies photophobia, eye pain, redness.  Has sinus problems.SABRA   Respiratory: Denies SOB, DOE, cough, chest tightness,  and wheezing.   Cardiovascular: Denies chest pain, palpitations and leg swelling.  Genitourinary: Denies dysuria, urgency, frequency, hematuria, flank pain and difficulty urinating.  Musculoskeletal: Denies myalgias, has back pain, joint swelling, arthralgias and gait problem.  Skin: No rash.  Neurological: Denies dizziness, seizures, syncope, weakness, light-headedness, numbness and headaches.  Hematological: Denies adenopathy. Easy bruising, personal or family bleeding history  Psychiatric/Behavioral: Has anxiety or depression     Physical Exam:    BP 120/71   Pulse 90   Temp (!) 97.4 F (36.3 C) (Temporal)   Ht 5' 6 (1.676 m)   Wt 172 lb (78 kg)   LMP  (LMP Unknown) Comment: hysterectomy 2008  SpO2 98%   BMI 27.76 kg/m  Wt Readings from Last 3 Encounters:  07/25/24 172 lb (78 kg)  07/19/24 173 lb (78.5 kg)  06/07/24 173 lb (78.5 kg)   Constitutional:  Well-developed, in no acute distress. Psychiatric: Normal mood and affect.  Behavior is normal. HEENT: Pupils normal.  Conjunctivae are normal. No scleral icterus.  No thrush. Cardiovascular: Normal rate, regular rhythm. No edema Pulmonary/chest: Effort normal and breath sounds normal. No wheezing, rales or rhonchi. Abdominal: Soft, nondistended.  Epigastric tenderness. bowel sounds active throughout. There are no masses palpable. No hepatomegaly. Rectal:  defered Neurological: Alert and oriented to person place and time. Skin: Skin is warm and dry. No rashes noted.  Data Reviewed: I have personally reviewed following labs and imaging studies  CBC:    Latest Ref Rng & Units 06/07/2024   11:33 AM 04/03/2024   10:34 AM 11/24/2023    8:05 AM  CBC  WBC 3.4 - 10.8 x10E3/uL 5.9  7.0  5.8   Hemoglobin 11.1 - 15.9 g/dL 86.7  86.1  85.5   Hematocrit 34.0 - 46.6 % 40.0  42.1  43.6   Platelets 150 - 450 x10E3/uL 296  275  356     CMP:    Latest Ref Rng & Units 06/07/2024  11:33 AM 04/11/2024    1:53 PM 04/03/2024   10:34 AM  CMP  Glucose 70 - 99 mg/dL 89   89   BUN 6 - 24 mg/dL 9   7   Creatinine 9.42 - 1.00 mg/dL 9.53   9.53   Sodium 865 - 144 mmol/L 140   138   Potassium 3.5 - 5.2 mmol/L 4.4   4.1   Chloride 96 - 106 mmol/L 101   101   CO2 20 - 29 mmol/L 26   27   Calcium  8.7 - 10.2 mg/dL 9.7   9.7   Total Protein 6.0 - 8.5 g/dL 6.5  6.8    Total Bilirubin 0.0 - 1.2 mg/dL 0.2  0.3    Alkaline Phos 49 - 135 IU/L 113     AST 0 - 40 IU/L 15  17    ALT 0 - 32 IU/L 19  21        Anselm Bring, MD 07/25/2024, 8:56 AM  Cc: Sherre Clapper, MD

## 2024-07-25 NOTE — Progress Notes (Signed)
 Called to room to assist during endoscopic procedure.  Patient ID and intended procedure confirmed with present staff. Received instructions for my participation in the procedure from the performing physician.

## 2024-07-26 ENCOUNTER — Telehealth: Payer: Self-pay

## 2024-07-26 ENCOUNTER — Other Ambulatory Visit: Payer: Self-pay | Admitting: Family Medicine

## 2024-07-26 DIAGNOSIS — K5904 Chronic idiopathic constipation: Secondary | ICD-10-CM

## 2024-07-26 NOTE — Telephone Encounter (Signed)
  Follow up Call-     07/25/2024    8:37 AM  Call back number  Post procedure Call Back phone  # [phone]  Permission to leave phone message Yes     Patient questions:  Do you have a fever, pain , or abdominal swelling? No. Pain Score  0 *  Have you tolerated food without any problems? Yes.    Have you been able to return to your normal activities? Yes.    Do you have any questions about your discharge instructions: Diet   No. Medications  No. Follow up visit  No.  Do you have questions or concerns about your Care? No.  Actions: * If pain score is 4 or above: No action needed, pain <4.

## 2024-07-30 ENCOUNTER — Encounter: Payer: Self-pay | Admitting: Podiatry

## 2024-07-30 ENCOUNTER — Ambulatory Visit: Admitting: Podiatry

## 2024-07-30 DIAGNOSIS — M205X1 Other deformities of toe(s) (acquired), right foot: Secondary | ICD-10-CM

## 2024-07-30 DIAGNOSIS — M06072 Rheumatoid arthritis without rheumatoid factor, left ankle and foot: Secondary | ICD-10-CM

## 2024-07-30 DIAGNOSIS — M06071 Rheumatoid arthritis without rheumatoid factor, right ankle and foot: Secondary | ICD-10-CM

## 2024-07-30 MED ORDER — BETAMETHASONE SOD PHOS & ACET 6 (3-3) MG/ML IJ SUSP
6.0000 mg | Freq: Once | INTRAMUSCULAR | Status: AC
  Administered 2024-07-30: 6 mg

## 2024-07-30 NOTE — Patient Instructions (Signed)
Hallux Limitus Hallux limitus is a condition involving pain and a loss of motion of the first (big) toe. The pain gets worse with lifting up (extension) of the toe. This is usually due to arthritic bony bumps (spurring) of the joint at the base of the big toe.  SYMPTOMS   Pain, with lifting up of the toe.  Tenderness over the joint where the big toe meets the foot.  Redness, swelling, and warmth over the top of the base of the big toe (sometimes).  Foot pain, stiffness, and limping. CAUSES  Hallux limitus is caused by arthritis of the joint where the big toe meets the foot. The arthritis creates a bone spur that pinches the soft tissues when the toe is extended. RISK INCREASES WITH:  Tight shoes with a narrow toe box.  Family history of foot problems.  Gout and rheumatoid and psoriatic arthritis.  History of previous toe injury, including "turf toe."  Long first toe, flat feet, and other big toe bony bumps.  Arthritis of the big toe. PREVENTION   Wear wide-toed shoes that fit well.  Tape the big toe to reduce motion and to prevent pinching of the tissues between the bone.  Maintain physical fitness:  Foot and ankle flexibility.  Muscle strength and endurance. PROGNOSIS  This condition can usually be managed with proper treatment. However, surgery is typically required to prevent the problem from recurring.  RELATED COMPLICATIONS  Injury to other areas of the foot or ankle, caused by abnormal walking in an attempt to avoid the pain felt when walking normally. TREATMENT Treatment first involves stopping the activities that aggravate your symptoms. Ice and medicine can be used to reduce the pain and inflammation. Modifications to shoes may help reduce pain, including wearing stiff-soled shoes, shoes with a wide toe box, inserting a padded donut to relieve pressure on top of the joint, or wearing an arch support. Corticosteroid injections may be given to reduce inflammation. If  nonsurgical treatment is unsuccessful, surgery may be needed. Surgical options include removing the arthritic bony spur, cutting a bone in the foot to change the arc of motion (allowing the toe to extend more), or fusion of the joint (eliminating all motion in the joint at the base of the big toe).  MEDICATION   If pain medicine is needed, nonsteroidal anti-inflammatory medicines (aspirin and ibuprofen), or other minor pain relievers (acetaminophen), are often advised.  Do not take pain medicine for 7 days before surgery.  Prescription pain relievers are usually prescribed only after surgery. Use only as directed and only as much as you need.  Ointments for arthritis, applied to the skin, may give some relief.  Injections of corticosteroids may be given to reduce inflammation. HEAT AND COLD  Cold treatment (icing) relieves pain and reduces inflammation. Cold treatment should be applied for 10 to 15 minutes every 2 to 3 hours, and immediately after activity that aggravates your symptoms. Use ice packs or an ice massage.  Heat treatment may be used before performing the stretching and strengthening activities prescribed by your caregiver, physical therapist, or athletic trainer. Use a heat pack or a warm water soak. SEEK MEDICAL CARE IF:   Symptoms get worse or do not improve in 2 weeks, despite treatment.  After surgery you develop fever, increasing pain, redness, swelling, drainage of fluids, bleeding, or increasing warmth.  New, unexplained symptoms develop.    

## 2024-07-30 NOTE — Progress Notes (Signed)
 "  Subjective:  Patient ID: [name], female    DOB: 08/27/1966,  MRN: [id number]  Chief Complaint  Patient presents with   Toe Pain    R 1st met pain dorsal pain. Pre-diabetic A1c 5.2 pt stated. No anti coag.    Discussed the use of AI scribe software for clinical note transcription with the patient, who gave verbal consent to proceed.  History of Present Illness [name] is a 58 year old female with rheumatoid arthritis who presents with pain in the big toe joint area.  She experiences significant pain in the big toe joint, primarily on top of the joint, which flares up and worsens with bending and at night. Flat shoes provide some relief, while shoes with a heel exacerbate the pain. She started using a bunion product from [employer] the day before the visit, which has provided some relief. She regularly takes 'my lot scan' and finds it helpful. On the day of the visit, she took two Tylenol  and three Advil, which provided some relief. She rates the pain as 'terrible' on a scale of one to ten. She is currently taking hydroxychloroquine  for rheumatoid arthritis, which causes significant pain in her hands and feet, especially in cold weather.      Objective:    Physical Exam Dermatological: With inspection and palpation of the right and left lower extremities there are no open sores, no preulcerative lesions, no rash or signs of infection present. Nails are of normal length thickness and coloration.    Vascular:  Dorsalis Pedis artery and Posterior Tibial artery pedal pulses are 2/4 bilateral.  Capillary fill time < 3 sec to all digits.    Neruologic: Grossly intact via light touch bilateral. Protective threshold intact to all sites bilateral.    Musculoskeletal: Decreased range of motion and pain of the right first MPJ which is made worse with dorsiflexion range of motion.  Worsened with forefoot loading.  Pain on palpation of the right first MPJ.   No images are attached to  the encounter.    Results Radiographs:  Date: 11/14/2023 XR right foot weightbearing AP/Lateral/Oblique   Findings: Elevated first metatarsal with dorsal spurring present.  Mild bunion deformity.  Some narrowing of the first MPJ joint space noted.   Assessment:   1. Hallux limitus of right foot   2. Rheumatoid arthritis involving both feet with negative rheumatoid factor (HCC)      Plan:  Patient was evaluated and treated and all questions answered.  Assessment and Plan Assessment & Plan Hallux limitus (hallux rigidus), right foot Chronic hallux limitus with significant pain and limited motion. Current management includes analgesics and a bunion device. Exploring shoe inserts to reduce joint pressure and decrease injection frequency. - Administered injection to the right big toe joint. - Recommended shoes with a stiff sole, such as [employer] or [employer]. - Provided codes for shoe inserts to check Medicaid coverage, would prescribe reverse morton's cut out - Advised to keep the Band-Aid on for 24 hours post-injection. - We have previously discussed surgery she would like to delay this as long as possible  Rheumatoid arthritis Chronic rheumatoid arthritis with significant pain in hands and feet. Managed with hydroxychloroquine . - Continue hydroxychloroquine  for rheumatoid arthritis management.  Procedure: Injection small joint right foot Discussed alternatives, risks, complications and verbal consent was obtained.  Location: Right right first metatarsal phalangeal joint. Skin Prep: Betadine. Injectate: One-to-one ratio of 0.5% Marcaine  plain mixed with betamethasone  to loose plan, total of 1.5  cc administered Disposition: Patient tolerated procedure well. Injection site dressed with a band-aid.  Post-injection care was discussed and return precautions discussed.      Return if symptoms worsen or fail to improve, for hallux limitus.    "

## 2024-07-31 LAB — SURGICAL PATHOLOGY

## 2024-08-01 ENCOUNTER — Other Ambulatory Visit: Payer: Self-pay

## 2024-08-01 ENCOUNTER — Encounter: Payer: Self-pay | Admitting: Family Medicine

## 2024-08-01 ENCOUNTER — Ambulatory Visit: Attending: Physician Assistant | Admitting: Physician Assistant

## 2024-08-01 ENCOUNTER — Telehealth: Payer: Self-pay

## 2024-08-01 ENCOUNTER — Encounter: Payer: Self-pay | Admitting: Physician Assistant

## 2024-08-01 VITALS — BP 132/81 | HR 85 | Temp 97.7°F | Resp 16 | Ht 66.0 in | Wt 174.6 lb

## 2024-08-01 DIAGNOSIS — M7062 Trochanteric bursitis, left hip: Secondary | ICD-10-CM | POA: Insufficient documentation

## 2024-08-01 DIAGNOSIS — M19072 Primary osteoarthritis, left ankle and foot: Secondary | ICD-10-CM | POA: Diagnosis present

## 2024-08-01 DIAGNOSIS — Z79899 Other long term (current) drug therapy: Secondary | ICD-10-CM | POA: Insufficient documentation

## 2024-08-01 DIAGNOSIS — M7711 Lateral epicondylitis, right elbow: Secondary | ICD-10-CM | POA: Insufficient documentation

## 2024-08-01 DIAGNOSIS — Q6672 Congenital pes cavus, left foot: Secondary | ICD-10-CM | POA: Insufficient documentation

## 2024-08-01 DIAGNOSIS — R7303 Prediabetes: Secondary | ICD-10-CM | POA: Insufficient documentation

## 2024-08-01 DIAGNOSIS — M19042 Primary osteoarthritis, left hand: Secondary | ICD-10-CM | POA: Insufficient documentation

## 2024-08-01 DIAGNOSIS — M19041 Primary osteoarthritis, right hand: Secondary | ICD-10-CM | POA: Insufficient documentation

## 2024-08-01 DIAGNOSIS — Z8709 Personal history of other diseases of the respiratory system: Secondary | ICD-10-CM | POA: Insufficient documentation

## 2024-08-01 DIAGNOSIS — M7712 Lateral epicondylitis, left elbow: Secondary | ICD-10-CM | POA: Insufficient documentation

## 2024-08-01 DIAGNOSIS — M7120 Synovial cyst of popliteal space [Baker], unspecified knee: Secondary | ICD-10-CM | POA: Insufficient documentation

## 2024-08-01 DIAGNOSIS — E782 Mixed hyperlipidemia: Secondary | ICD-10-CM | POA: Diagnosis present

## 2024-08-01 DIAGNOSIS — J301 Allergic rhinitis due to pollen: Secondary | ICD-10-CM | POA: Insufficient documentation

## 2024-08-01 DIAGNOSIS — M0600 Rheumatoid arthritis without rheumatoid factor, unspecified site: Secondary | ICD-10-CM | POA: Diagnosis not present

## 2024-08-01 DIAGNOSIS — M4126 Other idiopathic scoliosis, lumbar region: Secondary | ICD-10-CM | POA: Diagnosis present

## 2024-08-01 DIAGNOSIS — I700 Atherosclerosis of aorta: Secondary | ICD-10-CM | POA: Insufficient documentation

## 2024-08-01 DIAGNOSIS — Z720 Tobacco use: Secondary | ICD-10-CM | POA: Diagnosis present

## 2024-08-01 DIAGNOSIS — I10 Essential (primary) hypertension: Secondary | ICD-10-CM | POA: Insufficient documentation

## 2024-08-01 DIAGNOSIS — F411 Generalized anxiety disorder: Secondary | ICD-10-CM | POA: Diagnosis present

## 2024-08-01 DIAGNOSIS — Z8261 Family history of arthritis: Secondary | ICD-10-CM | POA: Insufficient documentation

## 2024-08-01 DIAGNOSIS — Z96651 Presence of right artificial knee joint: Secondary | ICD-10-CM | POA: Diagnosis present

## 2024-08-01 DIAGNOSIS — M7061 Trochanteric bursitis, right hip: Secondary | ICD-10-CM | POA: Insufficient documentation

## 2024-08-01 DIAGNOSIS — M19071 Primary osteoarthritis, right ankle and foot: Secondary | ICD-10-CM | POA: Diagnosis present

## 2024-08-01 DIAGNOSIS — K219 Gastro-esophageal reflux disease without esophagitis: Secondary | ICD-10-CM | POA: Insufficient documentation

## 2024-08-01 DIAGNOSIS — G43809 Other migraine, not intractable, without status migrainosus: Secondary | ICD-10-CM | POA: Insufficient documentation

## 2024-08-01 DIAGNOSIS — M51369 Other intervertebral disc degeneration, lumbar region without mention of lumbar back pain or lower extremity pain: Secondary | ICD-10-CM | POA: Insufficient documentation

## 2024-08-01 DIAGNOSIS — Z840 Family history of diseases of the skin and subcutaneous tissue: Secondary | ICD-10-CM | POA: Diagnosis present

## 2024-08-01 DIAGNOSIS — Q6671 Congenital pes cavus, right foot: Secondary | ICD-10-CM | POA: Insufficient documentation

## 2024-08-01 MED ORDER — ONDANSETRON HCL 4 MG/2ML IJ SOLN: 4.0000 mg | Freq: Once | INTRAMUSCULAR | Status: DC | PRN

## 2024-08-01 MED ORDER — HYDROXYCHLOROQUINE SULFATE 200 MG PO TABS: ORAL_TABLET | ORAL | 0 refills | Status: DC

## 2024-08-01 MED ORDER — DIAZEPAM 5 MG PO TABS
10.0000 mg | ORAL_TABLET | Freq: Once | ORAL | Status: AC
  Administered 2024-08-02: 10 mg via ORAL

## 2024-08-01 MED ORDER — MEPERIDINE HCL 50 MG/ML IJ SOLN: 50.0000 mg | Freq: Once | INTRAMUSCULAR | Status: DC | PRN

## 2024-08-01 NOTE — Progress Notes (Signed)
 See telephone note

## 2024-08-01 NOTE — Discharge Instructions (Signed)

## 2024-08-02 ENCOUNTER — Ambulatory Visit
Admission: RE | Admit: 2024-08-02 | Discharge: 2024-08-02 | Disposition: A | Discharge status: Home / Self Care | Source: Ambulatory Visit | Attending: Neurosurgery | Admitting: Neurosurgery

## 2024-08-02 ENCOUNTER — Ambulatory Visit
Admission: RE | Admit: 2024-08-02 | Discharge: 2024-08-02 | Disposition: A | Source: Ambulatory Visit | Attending: Neurosurgery | Admitting: Neurosurgery

## 2024-08-02 DIAGNOSIS — M5432 Sciatica, left side: Secondary | ICD-10-CM

## 2024-08-02 MED ORDER — IOPAMIDOL (ISOVUE-M 200) INJECTION 41%
15.0000 mL | Freq: Once | INTRAMUSCULAR | Status: AC
  Administered 2024-08-02: 15 mL via INTRA_ARTICULAR

## 2024-08-05 ENCOUNTER — Ambulatory Visit: Payer: Self-pay | Admitting: Gastroenterology

## 2024-08-05 NOTE — Progress Notes (Signed)
 Please inform the patient. Eso Bx-positive for Candida on H&E, negative PAS Endoscopic findings are consistent with mild Candida Please go ahead and treat with Diflucan  400 mg p.o. x 1 followed by 200 mg p.o. daily x 2 weeks Pharmacy to check for any interactions. Send report to family physician

## 2024-08-07 ENCOUNTER — Other Ambulatory Visit: Payer: Self-pay

## 2024-08-07 DIAGNOSIS — B379 Candidiasis, unspecified: Secondary | ICD-10-CM

## 2024-08-07 MED ORDER — FLUCONAZOLE 100 MG PO TABS: ORAL_TABLET | ORAL | 0 refills | Status: DC

## 2024-08-13 ENCOUNTER — Encounter: Payer: Self-pay | Admitting: Family Medicine

## 2024-08-14 ENCOUNTER — Other Ambulatory Visit: Payer: Self-pay | Admitting: Family Medicine

## 2024-08-14 MED ORDER — WEGOVY 2.4 MG/0.75ML ~~LOC~~ SOAJ: 2.4000 mg | SUBCUTANEOUS | 2 refills | Status: DC

## 2024-08-14 NOTE — Progress Notes (Unsigned)
 Wegovy

## 2024-08-20 ENCOUNTER — Other Ambulatory Visit: Payer: Self-pay

## 2024-08-20 DIAGNOSIS — Z6828 Body mass index (BMI) 28.0-28.9, adult: Secondary | ICD-10-CM

## 2024-08-20 MED ORDER — WEGOVY 2.4 MG/0.75ML ~~LOC~~ SOAJ: 2.4000 mg | SUBCUTANEOUS | 2 refills | Status: AC

## 2024-08-21 ENCOUNTER — Telehealth: Payer: Self-pay

## 2024-08-21 ENCOUNTER — Other Ambulatory Visit: Payer: Self-pay

## 2024-08-21 NOTE — Telephone Encounter (Signed)
PA for wegovy submitted and approved via covermymeds.

## 2024-08-24 ENCOUNTER — Ambulatory Visit: Admitting: Family Medicine

## 2024-08-28 ENCOUNTER — Ambulatory Visit: Admitting: Family Medicine

## 2024-09-03 ENCOUNTER — Ambulatory Visit: Admitting: Family Medicine

## 2024-09-06 ENCOUNTER — Telehealth: Admitting: Family Medicine

## 2024-09-06 ENCOUNTER — Other Ambulatory Visit: Payer: Self-pay | Admitting: Family Medicine

## 2024-09-06 ENCOUNTER — Other Ambulatory Visit: Payer: Self-pay | Admitting: Gastroenterology

## 2024-09-06 DIAGNOSIS — J309 Allergic rhinitis, unspecified: Secondary | ICD-10-CM

## 2024-09-06 DIAGNOSIS — B9789 Other viral agents as the cause of diseases classified elsewhere: Secondary | ICD-10-CM | POA: Diagnosis not present

## 2024-09-06 DIAGNOSIS — J329 Chronic sinusitis, unspecified: Secondary | ICD-10-CM

## 2024-09-06 MED ORDER — FLUTICASONE PROPIONATE 50 MCG/ACT NA SUSP: 2.0000 | Freq: Every day | NASAL | 6 refills | Status: AC

## 2024-09-06 NOTE — Progress Notes (Signed)
 We are sorry that you are not feeling well.  Here is how we plan to help!  Based on what you have shared with me it looks like you have sinusitis.  Sinusitis is inflammation and infection in the sinus cavities of the head.  Based on your presentation I believe you most likely have Acute Viral Sinusitis.This is an infection most likely caused by a virus. There is not specific treatment for viral sinusitis other than to help you with the symptoms until the infection runs its course.  You may use an oral decongestant such as Mucinex D or if you have glaucoma or high blood pressure use plain Mucinex. Saline nasal spray help and can safely be used as often as needed for congestion, I have prescribed: Fluticasone nasal spray two sprays in each nostril once a day  Some authorities believe that zinc sprays or the use of Echinacea may shorten the course of your symptoms.  Sinus infections are not as easily transmitted as other respiratory infection, however we still recommend that you avoid close contact with loved ones, especially the very young and elderly.  Remember to wash your hands thoroughly throughout the day as this is the number one way to prevent the spread of infection!  Home Care: Only take medications as instructed by your medical team. Do not take these medications with alcohol. A steam or ultrasonic humidifier can help congestion.  You can place a towel over your head and breathe in the steam from hot water  coming from a faucet. Avoid close contacts especially the very young and the elderly. Cover your mouth when you cough or sneeze. Always remember to wash your hands.  Get Help Right Away If: You develop worsening fever or sinus pain. You develop a severe head ache or visual changes. Your symptoms persist after you have completed your treatment plan.  Make sure you Understand these instructions. Will watch your condition. Will get help right away if you are not doing well or get  worse.  Your e-visit answers were reviewed by a board certified advanced clinical practitioner to complete your personal care plan.  Depending on the condition, your plan could have included both over the counter or prescription medications.  If there is a problem please reply  once you have received a response from your provider.  Your safety is important to us .  If you have drug allergies check your prescription carefully.    You can use MyChart to ask questions about today's visit, request a non-urgent call back, or ask for a work or school excuse for 24 hours related to this e-Visit. If it has been greater than 24 hours you will need to follow up with your provider, or enter a new e-Visit to address those concerns.  You will get an e-mail in the next two days asking about your experience.  I hope that your e-visit has been valuable and will speed your recovery. Thank you for using e-visits.  I have spent 5 minutes in review of e-visit questionnaire, review and updating patient chart, medical decision making and response to patient.   Damani Kelemen, FNP

## 2024-09-10 ENCOUNTER — Encounter: Payer: Self-pay | Admitting: Physician Assistant

## 2024-09-10 ENCOUNTER — Ambulatory Visit: Admitting: Physician Assistant

## 2024-09-10 VITALS — BP 126/70 | HR 83 | Temp 98.1°F | Ht 66.0 in | Wt 175.4 lb

## 2024-09-10 DIAGNOSIS — R8290 Unspecified abnormal findings in urine: Secondary | ICD-10-CM | POA: Diagnosis not present

## 2024-09-10 DIAGNOSIS — J060 Acute laryngopharyngitis: Secondary | ICD-10-CM

## 2024-09-10 DIAGNOSIS — R822 Biliuria: Secondary | ICD-10-CM

## 2024-09-10 LAB — HEPATIC FUNCTION PANEL
ALT: 18 IU/L (ref 0–32)
AST: 15 IU/L (ref 0–40)
Albumin: 4.7 g/dL (ref 3.8–4.9)
Alkaline Phosphatase: 95 IU/L (ref 49–135)
Bilirubin Total: 0.3 mg/dL (ref 0.0–1.2)
Bilirubin, Direct: 0.1 mg/dL (ref 0.00–0.40)
Total Protein: 6.7 g/dL (ref 6.0–8.5)

## 2024-09-10 LAB — POCT URINALYSIS DIP (CLINITEK)
Blood, UA: NEGATIVE
Glucose, UA: NEGATIVE mg/dL
Ketones, POC UA: NEGATIVE mg/dL
Leukocytes, UA: NEGATIVE
Nitrite, UA: NEGATIVE
Spec Grav, UA: 1.01
Urobilinogen, UA: 0.2 U/dL
pH, UA: 7

## 2024-09-10 LAB — POCT FLU A/B STATUS
Influenza A, POC: NEGATIVE
Influenza B, POC: NEGATIVE

## 2024-09-10 LAB — POC COVID19 BINAXNOW: SARS Coronavirus 2 Ag: NEGATIVE

## 2024-09-10 MED ORDER — AMOXICILLIN-POT CLAVULANATE 875-125 MG PO TABS: 1.0000 | ORAL_TABLET | Freq: Two times a day (BID) | ORAL | 0 refills | Status: DC

## 2024-09-10 MED ORDER — TRIAMCINOLONE ACETONIDE 40 MG/ML IJ SUSP
60.0000 mg | Freq: Once | INTRAMUSCULAR | Status: AC
  Administered 2024-09-10: 60 mg via INTRAMUSCULAR

## 2024-09-10 NOTE — Progress Notes (Signed)
 "  Acute Office Visit  Subjective:    Patient ID: [name], female    DOB: [birth year], 58 y.o.   MRN: [id number]  Chief Complaint  Patient presents with   Sinus Problem    HPI: Patient is in today for complaints of sinus pressure, pnd, productive cough and wheezing that started last Thursday - she denies fever, malaise or shortness of breath.  Is taking her regular allergy and asthma medications - not using advair regularly Complains of headache/malaise  Pt complains of urine odor - denies dysuria, urgency or nocturia Denies vaginal symptoms  Current Medications[1]  Allergies[2]  ROS CONSTITUTIONAL: Negative for chills, fatigue, fever,  E/N/T: see HPI CARDIOVASCULAR: Negative for chest pain, dizziness, palpitations - pt states yesterday her watch alerted her heart rate was at 120 but she was having no symptoms - no chest pain, palpitations, shortness of breath RESPIRATORY: see HPI GASTROINTESTINAL: Negative for abdominal pain, acid reflux symptoms, constipation, diarrhea, nausea and vomiting.  MSK: Negative for arthralgias and myalgias.  INTEGUMENTARY: Negative for rash.  NEUROLOGICAL: Negative for dizziness and headaches.  PSYCHIATRIC: Negative for sleep disturbance and to question depression screen.  Negative for depression, negative for anhedonia.      Objective:    PHYSICAL EXAM:   BP 126/70   Pulse 83   Temp 98.1 F (36.7 C)   Ht 5' 6 (1.676 m)   Wt 175 lb 6.4 oz (79.6 kg)   LMP  (LMP Unknown) Comment: hysterectomy 2008  SpO2 98%   BMI 28.31 kg/m    GEN: Well nourished, well developed, in no acute distress  HEENT: normal external ears and nose - normal external auditory canals and TMS -  - Lips, Teeth and Gums - normal  Oropharynx - erythema/pnd Cardiac: RRR; no murmurs, rubs, or gallops,no edema -  Respiratory:  normal respiratory rate and pattern with no distress - normal breath sounds with no rales, rhonchi, wheezes or rubs Skin: warm and dry, no  rash  Neuro:  Alert and Oriented x 3,  - CN II-Xii grossly intact Psych: euthymic mood, appropriate affect and demeanor     Office Visit on 09/10/2024  Component Date Value Ref Range Status   Color, UA 09/10/2024 yellow  yellow Final   Clarity, UA 09/10/2024 clear  clear Final   Glucose, UA 09/10/2024 negative  negative mg/dL Final   Bilirubin, UA 87/77/7974 small (A)  negative Final   Ketones, POC UA 09/10/2024 negative  negative mg/dL Final   Spec Grav, UA 87/77/7974 1.010  1.010 - 1.025 Final   Blood, UA 09/10/2024 negative  negative Final   pH, UA 09/10/2024 7.0  5.0 - 8.0 Final   POC PROTEIN,UA 09/10/2024 trace  negative, trace Final   Urobilinogen, UA 09/10/2024 0.2  0.2 or 1.0 E.U./dL Final   Nitrite, UA 87/77/7974 Negative  Negative Final   Leukocytes, UA 09/10/2024 Negative  Negative Final   SARS Coronavirus 2 Ag 09/10/2024 Negative  Negative Final   Influenza A, POC 09/10/2024 Negative  Negative Final   Influenza B, POC 09/10/2024 Negative  Negative Final    Assessment & Plan:    Acute laryngopharyngitis -     Triamcinolone  Acetonide -     Amoxicillin -Pot Clavulanate; Take 1 tablet by mouth 2 (two) times daily.  Dispense: 20 tablet; Refill: 0 -     POC COVID-19 BinaxNow -     POCT Flu A & B Status  Bilirubin in urine -     Hepatic function panel  Abnormal urine odor -     POCT URINALYSIS DIP (CLINITEK)     Follow-up: Return for chronic fasting follow-up with Dr Sherre.  An After Visit Summary was printed and given to the patient.  SARA R Mareo Portilla, PA-C [facility] [phone]    [1]  Current Outpatient Medications:    acetaminophen  (TYLENOL ) 650 MG CR tablet, Take 650-1,300 mg by mouth every 8 (eight) hours as needed for pain., Disp: , Rfl:    ADVAIR HFA 115-21 MCG/ACT inhaler, INHALE 2 PUFFS INTO THE LUNGS TWICE DAILY, Disp: 36 g, Rfl: 0   albuterol  (VENTOLIN  HFA) 108 (90 Base) MCG/ACT inhaler, INHALE 2 PUFFS INTO LUNGS EVERY 6 TO 8 HOURS AS NEEDED  FOR WHEEZING, Disp: 18 g, Rfl: 1   ALPRAZolam  (XANAX ) 0.25 MG tablet, TAKE 1 TABLET(0.25 MG) BY MOUTH DAILY AS NEEDED FOR ANXIETY, Disp: 30 tablet, Rfl: 1   amLODipine  (NORVASC ) 10 MG tablet, TAKE 1 TABLET BY MOUTH DAILY, Disp: 90 tablet, Rfl: 0   ammonium lactate (LAC-HYDRIN) 12 % lotion, Apply 1 Application topically as needed for dry skin., Disp: , Rfl:    amoxicillin -clavulanate (AUGMENTIN ) 875-125 MG tablet, Take 1 tablet by mouth 2 (two) times daily., Disp: 20 tablet, Rfl: 0   azelastine  (ASTELIN ) 0.1 % nasal spray, Place 2 sprays into both nostrils 2 (two) times daily. Use in each nostril as directed, Disp: 30 mL, Rfl: 12   buPROPion  (WELLBUTRIN  XL) 300 MG 24 hr tablet, TAKE 1 TABLET(300 MG) BY MOUTH DAILY., Disp: 90 tablet, Rfl: 1   chlorhexidine  (PERIDEX ) 0.12 % solution, Use as directed 5 mLs in the mouth or throat 2 (two) times daily., Disp: , Rfl:    Cholecalciferol (VITAMIN D -3 PO), Take by mouth., Disp: , Rfl:    cyclobenzaprine  (FLEXERIL ) 10 MG tablet, Take 1 tablet (10 mg total) by mouth 3 (three) times daily., Disp: 102 tablet, Rfl: 0   dicyclomine  (BENTYL ) 20 MG tablet, Take 1 tablet (20 mg total) by mouth 2 (two) times daily., Disp: 20 tablet, Rfl: 0   Docusate Sodium  (DSS) 100 MG CAPS, Take 100 mg by mouth daily as needed (constipation)., Disp: , Rfl:    Evolocumab  (REPATHA  SURECLICK) 140 MG/ML SOAJ, Inject 140 mg into the skin every 14 (fourteen) days., Disp: 2 mL, Rfl: 2   famotidine  (PEPCID ) 40 MG tablet, Take 1 tablet (40 mg total) by mouth daily as needed for heartburn or indigestion., Disp: 90 tablet, Rfl: 1   fluticasone  (FLONASE ) 50 MCG/ACT nasal spray, Place 2 sprays into both nostrils daily., Disp: 16 g, Rfl: 6   furosemide  (LASIX ) 20 MG tablet, TAKE 1 TABLET(20 MG) BY MOUTH DAILY AS NEEDED FOR SWELLING, Disp: 30 tablet, Rfl: 2   guaiFENesin  (MUCINEX ) 600 MG 12 hr tablet, Take 1 tablet (600 mg total) by mouth 2 (two) times daily., Disp: 60 tablet, Rfl: 1    HYDROcodone -acetaminophen  (NORCO/VICODIN) 5-325 MG tablet, Take 1-2 tablets by mouth every 6 (six) hours as needed., Disp: , Rfl:    hydroxychloroquine  (PLAQUENIL ) 200 MG tablet, Take 1 tablet 200 mg BID Monday-Friday, Disp: 120 tablet, Rfl: 0   ipratropium-albuterol  (DUONEB) 0.5-2.5 (3) MG/3ML SOLN, Take 3 mLs by nebulization every 6 (six) hours as needed (as needed)., Disp: , Rfl:    ketoconazole (NIZORAL) 2 % cream, Apply 1 Application topically 2 (two) times daily., Disp: , Rfl:    levocetirizine (XYZAL ) 5 MG tablet, Take 1 tablet (5 mg total) by mouth every evening., Disp: 90 tablet, Rfl: 0  lubiprostone  (AMITIZA ) 24 MCG capsule, Take 1 capsule (24 mcg total) by mouth 2 (two) times daily with a meal., Disp: 60 capsule, Rfl: 5   MAGNESIUM PO, Take 500 mg by mouth daily., Disp: , Rfl:    meloxicam (MOBIC) 15 MG tablet, Take 15 mg by mouth daily., Disp: , Rfl:    montelukast  (SINGULAIR ) 10 MG tablet, Take 1 tablet (10 mg total) by mouth at bedtime., Disp: 90 tablet, Rfl: 0   mupirocin  ointment (BACTROBAN ) 2 %, Apply 1 Application topically 2 (two) times daily., Disp: 22 g, Rfl: 0   nicotine  (NICODERM CQ  - DOSED IN MG/24 HOURS) 21 mg/24hr patch, Place 1 patch (21 mg total) onto the skin daily., Disp: 28 patch, Rfl: 1   nitroGLYCERIN  (NITROSTAT ) 0.4 MG SL tablet, Place 1 tablet (0.4 mg total) under the tongue every 5 (five) minutes as needed for chest pain., Disp: 50 tablet, Rfl: 3   nystatin  (MYCOSTATIN /NYSTOP ) powder, Apply 1 Application topically 3 (three) times daily., Disp: 15 g, Rfl: 0   ondansetron  (ZOFRAN -ODT) 4 MG disintegrating tablet, Take 1 tablet (4 mg total) by mouth every 8 (eight) hours as needed for nausea or vomiting. DISSOLVE 1 TABLET(4 MG) ON THE TONGUE EVERY 8 HOURS AS NEEDED FOR NAUSEA OR VOMITING, Disp: 20 tablet, Rfl: 0   pantoprazole  (PROTONIX ) 40 MG tablet, TAKE 1 TABLET(40 MG) BY MOUTH TWICE DAILY, Disp: 180 tablet, Rfl: 3   semaglutide -weight management (WEGOVY ) 2.4  MG/0.75ML SOAJ SQ injection, Inject 2.4 mg into the skin once a week., Disp: 3 mL, Rfl: 2   triamcinolone  (KENALOG ) 0.025 % ointment, Apply 1 Application topically 2 (two) times daily. Apply to corners of mouth twice daily, Disp: 30 g, Rfl: 0   TURMERIC PO, Take 1,000 mg by mouth daily. With ginger, Disp: , Rfl:    VITAMIN E PO, Take by mouth., Disp: , Rfl:  [2]  Allergies Allergen Reactions   Levofloxacin Other (See Comments) and Rash    Rash all over redness   Lipitor [Atorvastatin ] Other (See Comments)    Myalgia    Zetia [Ezetimibe] Other (See Comments)    Muscle pain   Bactrim  Ds [Sulfamethoxazole -Trimethoprim ] Nausea Only   Codeine Nausea Only   Other Rash    bandaids leave a red area if left on too long   Tape Hives   "

## 2024-09-11 ENCOUNTER — Ambulatory Visit: Payer: Self-pay | Admitting: Physician Assistant

## 2024-09-17 ENCOUNTER — Telehealth: Payer: Self-pay | Admitting: Family Medicine

## 2024-09-17 NOTE — Telephone Encounter (Signed)
 PA for Repatha  was approved.  PA case #[id number]

## 2024-09-26 ENCOUNTER — Other Ambulatory Visit: Payer: Self-pay | Admitting: Family Medicine

## 2024-09-26 DIAGNOSIS — K5904 Chronic idiopathic constipation: Secondary | ICD-10-CM

## 2024-10-03 ENCOUNTER — Other Ambulatory Visit: Payer: Self-pay | Admitting: Physician Assistant

## 2024-10-03 NOTE — Telephone Encounter (Signed)
 Last Fill: 08/01/2024  Eye exam: 09/28/2023 WNL   Labs: 06/07/2024  Creatinine 0.46  Next Visit: 10/16/2024  Last Visit: 08/01/2024  IK:Dzmnwzhjupcz rheumatoid arthritis (HCC)   Current Dose per office note 08/01/2024: Plaquenil  200 mg 1 tablet by mouth twice daily Monday through Friday.   Contacted the patient and patient states she is at her eye doctor currently and will have them fax over the results when she is done.   Okay to refill Plaquenil ?

## 2024-10-04 ENCOUNTER — Telehealth: Payer: Self-pay | Admitting: Gastroenterology

## 2024-10-04 ENCOUNTER — Encounter: Payer: Self-pay | Admitting: Gastroenterology

## 2024-10-04 DIAGNOSIS — R110 Nausea: Secondary | ICD-10-CM

## 2024-10-04 NOTE — Telephone Encounter (Signed)
 Inbound call from patient stating she would like to speak to nurse and be advised on what to do since she has been experiencing an extreme amount of burping and tasting like throw up. Would like to be advised on what to do Please advise  Thank you

## 2024-10-05 ENCOUNTER — Other Ambulatory Visit (HOSPITAL_COMMUNITY): Payer: Self-pay

## 2024-10-05 NOTE — Telephone Encounter (Signed)
 See phone note dated 10/05/24 for additional correspondence.

## 2024-10-05 NOTE — Telephone Encounter (Signed)
 Attempted to reach patient, following up on Mychart message that she had read but didn't respond.  Requested patient confirm mychart message or let us  know if she had further questions/concerns.

## 2024-10-07 ENCOUNTER — Other Ambulatory Visit: Payer: Self-pay | Admitting: Family Medicine

## 2024-10-07 DIAGNOSIS — J301 Allergic rhinitis due to pollen: Secondary | ICD-10-CM

## 2024-10-08 NOTE — Telephone Encounter (Signed)
 Pt stated that she is feeling much better today. Pt was notified that if she continues to have symptoms to please give our office a call back. Pt verbalized understanding with all questions answered.

## 2024-10-10 ENCOUNTER — Other Ambulatory Visit: Payer: Self-pay | Admitting: Physician Assistant

## 2024-10-16 ENCOUNTER — Ambulatory Visit

## 2024-10-16 ENCOUNTER — Ambulatory Visit: Attending: Rheumatology | Admitting: Rheumatology

## 2024-10-16 DIAGNOSIS — M79642 Pain in left hand: Secondary | ICD-10-CM | POA: Diagnosis present

## 2024-10-16 DIAGNOSIS — M79641 Pain in right hand: Secondary | ICD-10-CM | POA: Diagnosis not present

## 2024-10-16 DIAGNOSIS — M0600 Rheumatoid arthritis without rheumatoid factor, unspecified site: Secondary | ICD-10-CM | POA: Insufficient documentation

## 2024-10-16 NOTE — Progress Notes (Signed)
 Visit diagnosis: Seronegative rheumatoid arthritis, pain in both hands.   Ultrasound examination of bilateral hands was performed per EULAR recommendations. Using 15 MHz transducer, grayscale and power Doppler bilateral second and third MCP joints both dorsal and volar aspects were evaluated to look for synovitis or tenosynovitis. The findings were there was mild synovitis tenosynovitis on ultrasound examination.  Impression: Mild Doppler activity was noted in the 2nd and 3rd MCP joints bilaterally on the limited ultrasound examination of the hands.  Ultrasound findings were reviewed with the patient.  Patient stated she has been taking hydroxychloroquine  on a regular basis now.  She wanted to continue current treatment.  Maya Nash, MD

## 2024-10-18 NOTE — Progress Notes (Unsigned)
 "  Office Visit Note  Patient: [name]             Date of Birth: June 29, 1966           MRN: [id number]             PCP: Sherre Clapper, MD Referring: Sherre Clapper, MD Visit Date: 11/01/2024 Occupation: Data Unavailable  Subjective:  No chief complaint on file.   History of Present Illness: [name] is a 59 y.o. female ***     Activities of Daily Living:  Patient reports morning stiffness for *** {minute/hour:19697}.   Patient {ACTIONS;DENIES/REPORTS:21021675::Denies} nocturnal pain.  Difficulty dressing/grooming: {ACTIONS;DENIES/REPORTS:21021675::Denies} Difficulty climbing stairs: {ACTIONS;DENIES/REPORTS:21021675::Denies} Difficulty getting out of chair: {ACTIONS;DENIES/REPORTS:21021675::Denies} Difficulty using hands for taps, buttons, cutlery, and/or writing: {ACTIONS;DENIES/REPORTS:21021675::Denies}  No Rheumatology ROS completed.   PMFS History:  Patient Active Problem List   Diagnosis Date Noted   Abrasion of right lower leg 07/19/2024   Disorder of peroneal tendon 06/12/2024   Carpal tunnel syndrome of left wrist 06/10/2024   Intertrigo 06/10/2024   Heat intolerance 06/10/2024   Scapholunate advanced collapse of left wrist 05/09/2024   Arthropathy of lumbar facet joint 02/06/2024   Numbness and tingling sensation of skin 02/06/2024   COPD exacerbation (HCC) 12/23/2023   Sinus headache 12/14/2023   Chronic idiopathic constipation 12/13/2023   Right sided abdominal pain 12/13/2023   Irritable bowel syndrome with constipation 12/13/2023   Numbness of lower limb 12/06/2023   Rheumatoid arthritis (HCC) 11/27/2023   Chronic allergic rhinitis 11/26/2023   Elevated liver enzymes 10/25/2023   Acute right flank pain 10/25/2023   Frequency of urination 10/25/2023   Wheezing 10/05/2023   Opacities of both lungs present on chest x-ray 10/05/2023   Non-seasonal allergic rhinitis due to pollen 09/08/2023   Bilateral elbow joint pain 08/28/2023    Tinnitus of left ear 08/17/2023   Osteoarthritis of right elbow 07/14/2023   Pain in joint of right shoulder 07/14/2023   Seasonal allergic rhinitis due to pollen 06/16/2023   Left wrist pain 06/02/2023   Degenerative scoliosis in adult patient 02/28/2023   Aspirin -like platelet function defect (HCC) 01/13/2023   Dental caries 01/04/2023   History of blood transfusion 01/04/2023   Failure of dental implant due to infection 01/04/2023   Lumbar back pain 09/14/2022   Aortic atherosclerosis 05/08/2022   Chronic midline low back pain without sciatica 01/22/2022   Myalgia due to statin 01/22/2022   GERD (gastroesophageal reflux disease) 01/22/2022   Overweight with body mass index (BMI) of 28 to 28.9 in adult 12/31/2021   Prediabetes 10/20/2021   Migraine 10/20/2021   Primary hypertension 10/20/2021   DDD (degenerative disc disease), lumbar 06/17/2020   Neuropathy 04/08/2020   Simple chronic bronchitis (HCC) 01/06/2020   Cigarette nicotine  dependence with nicotine -induced disorder 01/01/2020   GAD (generalized anxiety disorder) 01/01/2020   Mixed hyperlipidemia 06/05/2019   Smoking 06/05/2019   Prinzmetal angina 06/05/2019   Tobacco abuse 06/05/2019   Osteoarthritis of left knee 02/09/2019   Pain of left hip joint 12/19/2018   Unilateral primary osteoarthritis of first carpometacarpal joint, right hand 11/20/2018   Right leg pain 01/29/2013   Chest pain 08/31/2011    Past Medical History:  Diagnosis Date   Allergic rhinitis    Allergy    Anginal pain    Arthritis    Blood transfusion without reported diagnosis    Cellulitis, face 06/08/2022   Complication of anesthesia    COPD (chronic obstructive pulmonary disease) (HCC)  Dental abscess 06/09/2022   Diabetes mellitus without complication (HCC)    GERD (gastroesophageal reflux disease)    History of colon polyps    History of kidney stones    HTN (hypertension)    Hyperlipidemia    Kidney stones    Osteoporosis     Pancreatitis    Pneumonia    PONV (postoperative nausea and vomiting)    Primary hypertension 10/20/2021   Psychotic depression (HCC)    Rheumatoid arthritis (HCC) 2024   Shingles February 26th, 2001   Tendinitis    both arms    Family History  Problem Relation Age of Onset   Arthritis Mother    Lung disease Mother    Parkinson's disease Mother    COPD Father    Arthritis Father    Healthy Sister    Breast cancer Maternal Aunt        great aunt   Rheum arthritis Maternal Uncle    Rheum arthritis Maternal Grandfather    Healthy Son    High Cholesterol Son    Healthy Son    Colon cancer Cousin        mother's cousin   Diabetes Other    Stroke Other    Hypertension Other    Hyperlipidemia Other    Asthma Other    Heart failure Other    Thyroid  disease Other    Heart attack Other    COPD Other    Arrhythmia Other    Arthritis Other    Migraines Other    Past Surgical History:  Procedure Laterality Date   ABDOMINAL HYSTERECTOMY     BACK SURGERY  02/2023   CARDIAC CATHETERIZATION  2008   CESAREAN SECTION     CHOLECYSTECTOMY     COLONOSCOPY  05/02/2017   Colonic polyp status post polypectomy. Small internal hemorrhoids   FRACTURE SURGERY Bilateral    femur   MINOR CARPAL TUNNEL Right 2025   3 weeks ago   REPLACEMENT TOTAL KNEE Right 02/09/2019   TUBAL LIGATION     ULNAR TUNNEL RELEASE  2025   3 weeks ago   Social History[1] Social History   Social History Narrative   Not on file     Immunization History  Administered Date(s) Administered   Influenza,inj,Quad PF,6+ Mos 06/27/2018   Influenza,trivalent, recombinat, inj, PF 07/11/2024   Influenza-Unspecified 07/08/2017, 08/09/2022   PNEUMOCOCCAL CONJUGATE-20 05/06/2022   Tdap 12/01/2021   Zoster Recombinant(Shingrix) 09/14/2017, 05/05/2018     Objective: Vital Signs: LMP  (LMP Unknown) Comment: hysterectomy 2008   Physical Exam   Musculoskeletal Exam: ***  CDAI Exam: CDAI Score: -- Patient  Global: --; Provider Global: -- Swollen: --; Tender: -- Joint Exam 11/01/2024   No joint exam has been documented for this visit   There is currently no information documented on the homunculus. Go to the Rheumatology activity and complete the homunculus joint exam.  Investigation: No additional findings.  Imaging: US  LIMITED JOINT SPACE STRUCTURES UP BILAT Result Date: 10/16/2024 Ultrasound examination of bilateral hands was performed per EULAR recommendations. Using 15 MHz transducer, grayscale and power Doppler bilateral second and third MCP joints both dorsal and volar aspects were evaluated to look for synovitis or tenosynovitis. The findings were there was mild synovitis tenosynovitis on ultrasound examination. Impression: Mild Doppler activity was noted in the 2nd and 3rd MCP joints bilaterally on the limited ultrasound examination of the hands.   Recent Labs: Lab Results  Component Value Date   WBC 5.9 06/07/2024  HGB 13.2 06/07/2024   PLT 296 06/07/2024   NA 140 06/07/2024   K 4.4 06/07/2024   CL 101 06/07/2024   CO2 26 06/07/2024   GLUCOSE 89 06/07/2024   BUN 9 06/07/2024   CREATININE 0.46 (L) 06/07/2024   BILITOT 0.3 09/10/2024   ALKPHOS 95 09/10/2024   AST 15 09/10/2024   ALT 18 09/10/2024   PROT 6.7 09/10/2024   ALBUMIN 4.7 09/10/2024   CALCIUM  9.7 06/07/2024   GFRAA 122 10/28/2020    Speciality Comments: PLQ Eye Exam: 10/03/2024 WNL @ [facility] follow up in 1 year.   Procedures:  No procedures performed Allergies: Levofloxacin, Lipitor [atorvastatin ], Zetia [ezetimibe], Bactrim  ds [sulfamethoxazole -trimethoprim ], Codeine, Other, and Tape   Assessment / Plan:     Visit Diagnoses: Seronegative rheumatoid arthritis (HCC)  High risk medication use  Pain in both hands  Primary osteoarthritis of both hands  Lateral epicondylitis of both elbows  Synovial cyst of popliteal space, unspecified laterality  Status post total knee replacement,  right  Pes cavus of both feet  Primary osteoarthritis of both feet  Degeneration of intervertebral disc of lumbar region without discogenic back pain or lower extremity pain  Trochanteric bursitis of both hips  Other idiopathic scoliosis, lumbar region  Primary hypertension  History of COPD  Mixed hyperlipidemia  Aortic atherosclerosis  Prediabetes  Tobacco abuse  Gastroesophageal reflux disease without esophagitis  GAD (generalized anxiety disorder)  Other migraine without status migrainosus, not intractable  Seasonal allergic rhinitis due to pollen  Family history of rheumatoid arthritis-maternal uncle and maternal grandfather  Family history of psoriasis-son and granddaughter  Orders: No orders of the defined types were placed in this encounter.  No orders of the defined types were placed in this encounter.   Face-to-face time spent with patient was *** minutes. Greater than 50% of time was spent in counseling and coordination of care.  Follow-Up Instructions: No follow-ups on file.   Waddell CHRISTELLA Craze, PA-C  Note - This record has been created using Dragon software.  Chart creation errors have been sought, but may not always  have been located. Such creation errors do not reflect on  the standard of medical care.     [1]  Social History Tobacco Use   Smoking status: Every Day    Current packs/day: 1.00    Types: Cigarettes    Passive exposure: Past   Smokeless tobacco: Never   Tobacco comments:    Half a pack.  Vaping Use   Vaping status: Never Used  Substance Use Topics   Alcohol use: Yes    Comment: rarely   Drug use: No   "

## 2024-10-19 ENCOUNTER — Encounter: Payer: Self-pay | Admitting: Family Medicine

## 2024-10-19 ENCOUNTER — Ambulatory Visit: Admitting: Family Medicine

## 2024-10-19 VITALS — BP 136/70 | HR 88 | Temp 98.2°F | Ht 66.0 in | Wt 175.0 lb

## 2024-10-19 DIAGNOSIS — J410 Simple chronic bronchitis: Secondary | ICD-10-CM

## 2024-10-19 DIAGNOSIS — M06041 Rheumatoid arthritis without rheumatoid factor, right hand: Secondary | ICD-10-CM | POA: Diagnosis not present

## 2024-10-19 DIAGNOSIS — K5904 Chronic idiopathic constipation: Secondary | ICD-10-CM | POA: Diagnosis not present

## 2024-10-19 DIAGNOSIS — I10 Essential (primary) hypertension: Secondary | ICD-10-CM

## 2024-10-19 DIAGNOSIS — R7303 Prediabetes: Secondary | ICD-10-CM | POA: Diagnosis not present

## 2024-10-19 DIAGNOSIS — E782 Mixed hyperlipidemia: Secondary | ICD-10-CM | POA: Diagnosis not present

## 2024-10-19 DIAGNOSIS — M06042 Rheumatoid arthritis without rheumatoid factor, left hand: Secondary | ICD-10-CM | POA: Diagnosis not present

## 2024-10-19 DIAGNOSIS — F17219 Nicotine dependence, cigarettes, with unspecified nicotine-induced disorders: Secondary | ICD-10-CM

## 2024-10-19 DIAGNOSIS — F411 Generalized anxiety disorder: Secondary | ICD-10-CM

## 2024-10-19 DIAGNOSIS — L304 Erythema intertrigo: Secondary | ICD-10-CM | POA: Diagnosis not present

## 2024-10-19 DIAGNOSIS — K219 Gastro-esophageal reflux disease without esophagitis: Secondary | ICD-10-CM | POA: Diagnosis not present

## 2024-10-19 LAB — CBC WITH DIFFERENTIAL/PLATELET
Basophils Absolute: 0 10*3/uL (ref 0.0–0.2)
Basos: 1 %
EOS (ABSOLUTE): 0.2 10*3/uL (ref 0.0–0.4)
Eos: 3 %
Hematocrit: 41.8 % (ref 34.0–46.6)
Hemoglobin: 14.2 g/dL (ref 11.1–15.9)
Immature Grans (Abs): 0 10*3/uL (ref 0.0–0.1)
Immature Granulocytes: 0 %
Lymphocytes Absolute: 1.9 10*3/uL (ref 0.7–3.1)
Lymphs: 30 %
MCH: 31.8 pg (ref 26.6–33.0)
MCHC: 34 g/dL (ref 31.5–35.7)
MCV: 94 fL (ref 79–97)
Monocytes Absolute: 0.5 10*3/uL (ref 0.1–0.9)
Monocytes: 8 %
Neutrophils Absolute: 3.8 10*3/uL (ref 1.4–7.0)
Neutrophils: 58 %
Platelets: 293 10*3/uL (ref 150–450)
RBC: 4.47 x10E6/uL (ref 3.77–5.28)
RDW: 12.1 % (ref 11.7–15.4)
WBC: 6.4 10*3/uL (ref 3.4–10.8)

## 2024-10-19 LAB — COMPREHENSIVE METABOLIC PANEL WITH GFR
ALT: 23 [IU]/L (ref 0–32)
AST: 18 [IU]/L (ref 0–40)
Albumin: 4.6 g/dL (ref 3.8–4.9)
Alkaline Phosphatase: 87 [IU]/L (ref 49–135)
BUN/Creatinine Ratio: 20 (ref 9–23)
BUN: 11 mg/dL (ref 6–24)
Bilirubin Total: 0.3 mg/dL (ref 0.0–1.2)
CO2: 25 mmol/L (ref 20–29)
Calcium: 9.5 mg/dL (ref 8.7–10.2)
Chloride: 101 mmol/L (ref 96–106)
Creatinine, Ser: 0.56 mg/dL — ABNORMAL LOW (ref 0.57–1.00)
Globulin, Total: 1.7 g/dL (ref 1.5–4.5)
Glucose: 89 mg/dL (ref 70–99)
Potassium: 4.6 mmol/L (ref 3.5–5.2)
Sodium: 141 mmol/L (ref 134–144)
Total Protein: 6.3 g/dL (ref 6.0–8.5)
eGFR: 105 mL/min/{1.73_m2}

## 2024-10-19 LAB — POCT LIPID PANEL
HDL: 68
LDL: 99
Non-HDL: 125
TC: 193
TRG: 133

## 2024-10-19 LAB — POCT GLYCOSYLATED HEMOGLOBIN (HGB A1C): HbA1c POC (<> result, manual entry): 5.6 %

## 2024-10-19 MED ORDER — SEMAGLUTIDE (2 MG/DOSE) 8 MG/3ML ~~LOC~~ SOPN: 2.0000 mg | PEN_INJECTOR | SUBCUTANEOUS | 2 refills | Status: AC

## 2024-10-19 MED ORDER — ALPRAZOLAM 0.25 MG PO TABS: ORAL_TABLET | ORAL | 1 refills | Status: AC

## 2024-10-19 MED ORDER — BUPROPION HCL ER (XL) 300 MG PO TB24: 300.0000 mg | ORAL_TABLET | Freq: Every day | ORAL | 1 refills | Status: AC

## 2024-10-19 MED ORDER — FAMOTIDINE 40 MG PO TABS: 40.0000 mg | ORAL_TABLET | Freq: Every day | ORAL | 1 refills | Status: AC

## 2024-10-19 NOTE — Assessment & Plan Note (Signed)
 Orders:    POCT glycosylated hemoglobin (Hb A1C)

## 2024-10-19 NOTE — Assessment & Plan Note (Signed)
 Mixed hyperlipidemia with increased cardiovascular risk LDL cholesterol improved to 99 mg/dL but remains above target due to cardiovascular risk. - Continue Repatha  injections every 14 days. Recommend continue to work on eating healthy diet and exercise.  Orders:   POCT Lipid Panel   CBC with Differential/Platelet   Comprehensive metabolic panel with GFR

## 2024-10-19 NOTE — Assessment & Plan Note (Signed)
 SABRA

## 2024-10-19 NOTE — Progress Notes (Unsigned)
 "  Subjective:  Patient ID: [name], female    DOB: Feb 13, 1966  Age: 59 y.o. MRN: [id number]  Chief Complaint  Patient presents with   Medical Management of Chronic Issues    HPI: Discussed the use of AI scribe software for clinical note transcription with the patient, who gave verbal consent to proceed.  History of Present Illness [name] is a 59 year old female who presents for a routine visit and medication management.  Medication management and insurance concerns - Currently using Wegovy  2.4 mg for coronary artery disease - Patient insists her medicare representative says I can switch to Ozempic , due to upcoming insurance changes and concerns about cost-effectiveness, and she will get it covered cheaper. I explained that if she is not diabetic this would not work. At least that is my understanding.   Weight fluctuation and lifestyle modification - Current weight 175 pounds, increased from 169 pounds a few days ago - Difficulty maintaining exercise routine due to recent hand and elbow surgeries - Adhering to a healthy diet, avoiding sweets and carbohydrates, focusing on fruits, vegetables, and protein  Headache and upper respiratory symptoms - Continuous headache with right head pressure for the past 3-4 days, described as a sinus headache - Taking Alka-Seltzer for relief - Dry cough, particularly in the mornings - Slight discomfort in right ear - No fevers, chills, sweats, or sore throat  Gastroesophageal reflux disease and esophageal symptoms - Ongoing issues with swallowing and esophageal discomfort - Takes Protonix  once daily, supplements with omeprazole  and Tums as needed - Esophagus dilated in October with initial improvement, but symptoms have since returned  Musculoskeletal and joint symptoms - History of ganglion cyst on wrist, trigger finger on thumb, and carpal tunnel syndrome, all surgically treated - Ongoing elbow issues, considering further  surgery  Urinary symptoms - History of urinary tract issues with strong odor, previously evaluated and found to have bilirubin present - Manages symptoms with probiotics for women  Tobacco use and smoking cessation - Currently uses nicotine  patches - Smokes approximately half a pack per day       09/10/2024    8:59 AM 03/21/2024    7:50 AM 02/06/2024   11:45 AM 08/05/2023    7:44 AM 04/12/2023    7:47 AM  Depression screen PHQ 2/9  Decreased Interest 0 2 2 1 1   Down, Depressed, Hopeless 0 2 2 1 1   PHQ - 2 Score 0 4 4 2 2   Altered sleeping  2 2 2 2   Tired, decreased energy  2 2 1 2   Change in appetite  2 2 1  0  Feeling bad or failure about yourself   2 2 1 1   Trouble concentrating  2 2 1  0  Moving slowly or fidgety/restless  1 0 0 0  Suicidal thoughts  1 0 0 0  PHQ-9 Score  16  14  8  7    Difficult doing work/chores  Not difficult at all Not difficult at all Not difficult at all Not difficult at all     Data saved with a previous flowsheet row definition        09/10/2024    8:59 AM  Fall Risk   Falls in the past year? 0  Number falls in past yr: 0  Injury with Fall? 0  Risk for fall due to : No Fall Risks  Follow up Falls evaluation completed    Patient Care Team: Sherre Clapper, MD as PCP - General (  Family Medicine) Isidor Somerset, MD as Referring Physician (Cardiology) Charlanne Groom, MD as Consulting Physician (Gastroenterology) Mavis Purchase, MD as Consulting Physician (Neurosurgery) Sarrah Browning, MD as Consulting Physician (Obstetrics and Gynecology) Georgina Norleen SAUNDERS, MD as Referring Physician (Orthopedic Surgery) Dolphus Reiter, MD as Consulting Physician (Rheumatology) Watt Norleen, MD as Attending Physician (Urology)   Review of Systems  Constitutional:  Negative for chills, fatigue and fever.  HENT:  Positive for sinus pain. Negative for congestion, ear pain and sore throat.   Respiratory:  Negative for cough and shortness of breath.    Cardiovascular:  Negative for chest pain.  Gastrointestinal:  Negative for abdominal pain, constipation, diarrhea, nausea and vomiting.       Dysphagia  Genitourinary:  Negative for dysuria and urgency.       Strong urine  Musculoskeletal:  Positive for arthralgias. Negative for myalgias.  Skin:  Negative for rash.  Neurological:  Positive for headaches.  Psychiatric/Behavioral:  Negative for dysphoric mood. The patient is not nervous/anxious.     Medications Ordered Prior to Encounter[1] Past Medical History:  Diagnosis Date   Allergic rhinitis    Allergy    Anginal pain    Arthritis    Blood transfusion without reported diagnosis    Cellulitis, face 06/08/2022   Complication of anesthesia    COPD (chronic obstructive pulmonary disease) (HCC)    Dental abscess 06/09/2022   Diabetes mellitus without complication (HCC)    GERD (gastroesophageal reflux disease)    History of colon polyps    History of kidney stones    HTN (hypertension)    Hyperlipidemia    Kidney stones    Osteoporosis    Pancreatitis    Pneumonia    PONV (postoperative nausea and vomiting)    Primary hypertension 10/20/2021   Psychotic depression (HCC)    Rheumatoid arthritis (HCC) 2024   Shingles February 26th, 2001   Tendinitis    both arms   Past Surgical History:  Procedure Laterality Date   ABDOMINAL HYSTERECTOMY     BACK SURGERY  02/2023   CARDIAC CATHETERIZATION  2008   CESAREAN SECTION     CHOLECYSTECTOMY     COLONOSCOPY  05/02/2017   Colonic polyp status post polypectomy. Small internal hemorrhoids   FRACTURE SURGERY Bilateral    femur   MINOR CARPAL TUNNEL Right 2025   3 weeks ago   REPLACEMENT TOTAL KNEE Right 02/09/2019   TUBAL LIGATION     ULNAR TUNNEL RELEASE  2025   3 weeks ago    Family History  Problem Relation Age of Onset   Arthritis Mother    Lung disease Mother    Parkinson's disease Mother    COPD Father    Arthritis Father    Healthy Sister    Breast cancer  Maternal Aunt        great aunt   Rheum arthritis Maternal Uncle    Rheum arthritis Maternal Grandfather    Healthy Son    High Cholesterol Son    Healthy Son    Colon cancer Cousin        mother's cousin   Diabetes Other    Stroke Other    Hypertension Other    Hyperlipidemia Other    Asthma Other    Heart failure Other    Thyroid  disease Other    Heart attack Other    COPD Other    Arrhythmia Other    Arthritis Other    Migraines Other    Social  History   Socioeconomic History   Marital status: Widowed    Spouse name: Not on file   Number of children: 2   Years of education: Not on file   Highest education level: GED or equivalent  Occupational History   Not on file  Tobacco Use   Smoking status: Every Day    Current packs/day: 1.00    Types: Cigarettes    Passive exposure: Past   Smokeless tobacco: Never   Tobacco comments:    Half a pack.  Vaping Use   Vaping status: Never Used  Substance and Sexual Activity   Alcohol use: Yes    Comment: rarely   Drug use: No   Sexual activity: Not Currently  Other Topics Concern   Not on file  Social History Narrative   Not on file   Social Drivers of Health   Tobacco Use: High Risk (10/19/2024)   Patient History    Smoking Tobacco Use: Every Day    Smokeless Tobacco Use: Never    Passive Exposure: Past  Financial Resource Strain: Medium Risk (07/19/2024)   Overall Financial Resource Strain (CARDIA)    Difficulty of Paying Living Expenses: Somewhat hard  Food Insecurity: Food Insecurity Present (07/19/2024)   Epic    Worried About Programme Researcher, Broadcasting/film/video in the Last Year: Sometimes true    Ran Out of Food in the Last Year: Sometimes true  Transportation Needs: No Transportation Needs (07/19/2024)   Epic    Lack of Transportation (Medical): No    Lack of Transportation (Non-Medical): No  Physical Activity: Inactive (07/19/2024)   Exercise Vital Sign    Days of Exercise per Week: 0 days    Minutes of Exercise  per Session: Not on file  Stress: Stress Concern Present (07/19/2024)   [employer] of Occupational Health - Occupational Stress Questionnaire    Feeling of Stress: To some extent  Social Connections: Moderately Integrated (07/19/2024)   Social Connection and Isolation Panel    Frequency of Communication with Friends and Family: More than three times a week    Frequency of Social Gatherings with Friends and Family: Once a week    Attends Religious Services: More than 4 times per year    Active Member of [employer] or Organizations: Yes    Attends Banker Meetings: More than 4 times per year    Marital Status: Widowed  Depression (PHQ2-9): Low Risk (09/10/2024)   Depression (PHQ2-9)    PHQ-2 Score: 0  Alcohol Screen: Low Risk (08/05/2023)   Alcohol Screen    Last Alcohol Screening Score (AUDIT): 1  Housing: Low Risk (07/19/2024)   Epic    Unable to Pay for Housing in the Last Year: No    Number of Times Moved in the Last Year: 0    Homeless in the Last Year: No  Utilities: Not At Risk (10/19/2024)   Epic    Threatened with loss of utilities: No  Health Literacy: Adequate Health Literacy (04/12/2023)   B1300 Health Literacy    Frequency of need for help with medical instructions: Never    Objective:  BP 136/70   Pulse 88   Temp 98.2 F (36.8 C)   Ht 5' 6 (1.676 m)   Wt 175 lb (79.4 kg)   LMP  (LMP Unknown) Comment: hysterectomy 2008  SpO2 98%   BMI 28.25 kg/m      10/19/2024    8:01 AM 09/10/2024    8:54 AM 08/02/2024   10:13  AM  BP/Weight  Systolic BP 136 126 111  Diastolic BP 70 70 60  Wt. (Lbs) 175 175.4   BMI 28.25 kg/m2 28.31 kg/m2     Physical Exam Vitals reviewed.  Constitutional:      Appearance: Normal appearance. She is obese.  Neck:     Vascular: No carotid bruit.  Cardiovascular:     Rate and Rhythm: Normal rate and regular rhythm.     Heart sounds: Normal heart sounds.  Pulmonary:     Effort: Pulmonary effort is normal. No  respiratory distress.     Breath sounds: Normal breath sounds.  Abdominal:     General: Abdomen is flat. Bowel sounds are normal.     Palpations: Abdomen is soft.     Tenderness: There is no abdominal tenderness.  Neurological:     Mental Status: She is alert and oriented to person, place, and time.  Psychiatric:        Mood and Affect: Mood normal.        Behavior: Behavior normal.         Lab Results  Component Value Date   WBC 6.4 10/19/2024   HGB 14.2 10/19/2024   HCT 41.8 10/19/2024   PLT 293 10/19/2024   GLUCOSE 89 10/19/2024   CHOL 214 (H) 06/07/2024   TRIG 101 06/07/2024   HDL 61 06/07/2024   LDLCALC 135 (H) 06/07/2024   ALT 23 10/19/2024   AST 18 10/19/2024   NA 141 10/19/2024   K 4.6 10/19/2024   CL 101 10/19/2024   CREATININE 0.56 (L) 10/19/2024   BUN 11 10/19/2024   CO2 25 10/19/2024   TSH 1.650 06/07/2024   INR 0.8 01/04/2023   HGBA1C 5.6 10/19/2024    Results for orders placed or performed in visit on 10/19/24  POCT Lipid Panel   Collection Time: 10/19/24  8:23 AM  Result Value Ref Range   TC 193    HDL 68    TRG 133    LDL 99    Non-HDL 125    TC/HDL    POCT glycosylated hemoglobin (Hb A1C)   Collection Time: 10/19/24  8:24 AM  Result Value Ref Range   Hemoglobin A1C     HbA1c POC (<> result, manual entry) 5.6 4.0 - 5.6 %   HbA1c, POC (prediabetic range)     HbA1c, POC (controlled diabetic range)    CBC with Differential/Platelet   Collection Time: 10/19/24  8:50 AM  Result Value Ref Range   WBC 6.4 3.4 - 10.8 x10E3/uL   RBC 4.47 3.77 - 5.28 x10E6/uL   Hemoglobin 14.2 11.1 - 15.9 g/dL   Hematocrit 58.1 65.9 - 46.6 %   MCV 94 79 - 97 fL   MCH 31.8 26.6 - 33.0 pg   MCHC 34.0 31.5 - 35.7 g/dL   RDW 87.8 88.2 - 84.5 %   Platelets 293 150 - 450 x10E3/uL   Neutrophils 58 Not Estab. %   Lymphs 30 Not Estab. %   Monocytes 8 Not Estab. %   Eos 3 Not Estab. %   Basos 1 Not Estab. %   Neutrophils Absolute 3.8 1.4 - 7.0 x10E3/uL    Lymphocytes Absolute 1.9 0.7 - 3.1 x10E3/uL   Monocytes Absolute 0.5 0.1 - 0.9 x10E3/uL   EOS (ABSOLUTE) 0.2 0.0 - 0.4 x10E3/uL   Basophils Absolute 0.0 0.0 - 0.2 x10E3/uL   Immature Granulocytes 0 Not Estab. %   Immature Grans (Abs) 0.0 0.0 - 0.1 x10E3/uL  Comprehensive metabolic panel with GFR   Collection Time: 10/19/24  8:50 AM  Result Value Ref Range   Glucose 89 70 - 99 mg/dL   BUN 11 6 - 24 mg/dL   Creatinine, Ser 9.43 (L) 0.57 - 1.00 mg/dL   eGFR 894 >40 fO/fpw/8.26   BUN/Creatinine Ratio 20 9 - 23   Sodium 141 134 - 144 mmol/L   Potassium 4.6 3.5 - 5.2 mmol/L   Chloride 101 96 - 106 mmol/L   CO2 25 20 - 29 mmol/L   Calcium  9.5 8.7 - 10.2 mg/dL   Total Protein 6.3 6.0 - 8.5 g/dL   Albumin 4.6 3.8 - 4.9 g/dL   Globulin, Total 1.7 1.5 - 4.5 g/dL   Bilirubin Total 0.3 0.0 - 1.2 mg/dL   Alkaline Phosphatase 87 49 - 135 IU/L   AST 18 0 - 40 IU/L   ALT 23 0 - 32 IU/L  .  Assessment & Plan:   Assessment & Plan Prediabetes A1c is 5.6%, indicating good control. Orders:   POCT glycosylated hemoglobin (Hb A1C)  Mixed hyperlipidemia Mixed hyperlipidemia with increased cardiovascular risk LDL cholesterol improved to 99 mg/dL but remains above target due to cardiovascular risk. - Continue Repatha  injections every 14 days. Recommend continue to work on eating healthy diet and exercise.  Orders:   POCT Lipid Panel   CBC with Differential/Platelet   Comprehensive metabolic panel with GFR  GAD (generalized anxiety disorder) Managed with alprazolam  as needed and bupropion  XL. Reports occasional use of alprazolam . - Continue bupropion  XL 300 mg daily. - refilled alprazolam  as needed. Orders:   ALPRAZolam  (XANAX ) 0.25 MG tablet; TAKE 1 TABLET(0.25 MG) BY MOUTH DAILY AS NEEDED FOR ANXIETY  Cigarette nicotine  dependence with nicotine -induced disorder Currently smoking half a pack per day. Using nicotine  patches. Attempting to quit smoking. - Continue nicotine  patches 21 mg  daily. - Encouraged smoking cessation efforts. Orders:   buPROPion  (WELLBUTRIN  XL) 300 MG 24 hr tablet; Take 1 tablet (300 mg total) by mouth daily.  Gastroesophageal reflux disease without esophagitis Persistent esophageal dysphagia despite pantoprazole . Recent esophageal dilation provided temporary relief. - Increased pantoprazole  to twice daily. - Added famotidine  at night if symptoms persist.    Rheumatoid arthritis involving both hands with negative rheumatoid factor (HCC) Managed with hydroxychloroquine . Recent ultrasound shows improvement. - Continue hydroxychloroquine  200 mg twice daily Monday to Friday.    Primary hypertension Blood pressure slightly elevated at 136 mmHg systolic. - Continue amlodipine  10 mg daily.    Intertrigo Chronic dermatitis under the breasts with ongoing moisture and irritation. - Consider using over-the-counter deodorant to keep area dry.    Simple chronic bronchitis (HCC) Occasional cough, especially in the morning. - Continue Advair and albuterol  inhaler as needed.    Chronic idiopathic constipation Managed with lubiprostone  and docusate sodium . Occasional use of dicyclomine  for bowel spasms. - Continue lubiprostone  and docusate sodium  as needed. - Use dicyclomine  for bowel spasms as needed.     Body mass index is 28.25 kg/m.    Meds ordered this encounter  Medications   ALPRAZolam  (XANAX ) 0.25 MG tablet    Sig: TAKE 1 TABLET(0.25 MG) BY MOUTH DAILY AS NEEDED FOR ANXIETY    Dispense:  30 tablet    Refill:  1   Semaglutide , 2 MG/DOSE, 8 MG/3ML SOPN    Sig: Inject 2 mg as directed once a week.    Dispense:  3 mL    Refill:  2   buPROPion  (WELLBUTRIN  XL) 300 MG 24  hr tablet    Sig: Take 1 tablet (300 mg total) by mouth daily.    Dispense:  90 tablet    Refill:  1   famotidine  (PEPCID ) 40 MG tablet    Sig: Take 1 tablet (40 mg total) by mouth at bedtime.    Dispense:  90 tablet    Refill:  1    Orders Placed This Encounter   Procedures   CBC with Differential/Platelet   Comprehensive metabolic panel with GFR   POCT glycosylated hemoglobin (Hb A1C)   POCT Lipid Panel     I,Marla I Leal-Borjas,acting as a scribe for Abigail Free, MD.,have documented all relevant documentation on the behalf of Abigail Free, MD,as directed by  Abigail Free, MD while in the presence of Abigail Free, MD.   Follow-up: Return in about 3 months (around 01/17/2025) for chronic follow up.  An After Visit Summary was printed and given to the patient.  I attest that I have reviewed this visit and agree with the plan scribed by my staff.   Abigail Free, MD [facility] [phone]       [1]  Current Outpatient Medications on File Prior to Visit  Medication Sig Dispense Refill   acetaminophen  (TYLENOL ) 650 MG CR tablet Take 650-1,300 mg by mouth every 8 (eight) hours as needed for pain.     ADVAIR HFA 115-21 MCG/ACT inhaler INHALE 2 PUFFS INTO THE LUNGS TWICE DAILY 36 g 0   albuterol  (VENTOLIN  HFA) 108 (90 Base) MCG/ACT inhaler INHALE 2 PUFFS INTO LUNGS EVERY 6 TO 8 HOURS AS NEEDED FOR WHEEZING 18 g 1   amLODipine  (NORVASC ) 10 MG tablet TAKE 1 TABLET BY MOUTH DAILY 90 tablet 0   ammonium lactate (LAC-HYDRIN) 12 % lotion Apply 1 Application topically as needed for dry skin.     chlorhexidine  (PERIDEX ) 0.12 % solution Use as directed 5 mLs in the mouth or throat 2 (two) times daily.     Cholecalciferol (VITAMIN D -3 PO) Take by mouth.     Docusate Sodium  (DSS) 100 MG CAPS Take 100 mg by mouth daily as needed (constipation).     Evolocumab  (REPATHA  SURECLICK) 140 MG/ML SOAJ Inject 140 mg into the skin every 14 (fourteen) days. 2 mL 2   fluticasone  (FLONASE ) 50 MCG/ACT nasal spray Place 2 sprays into both nostrils daily. 16 g 6   furosemide  (LASIX ) 20 MG tablet TAKE 1 TABLET(20 MG) BY MOUTH DAILY AS NEEDED FOR SWELLING 30 tablet 2   HYDROcodone -acetaminophen  (NORCO/VICODIN) 5-325 MG tablet Take 1-2 tablets by mouth every 6 (six)  hours as needed.     hydroxychloroquine  (PLAQUENIL ) 200 MG tablet TAKE 1 TABLET BY MOUTH TWICE DAILY MONDAY TO FRIDAY 120 tablet 0   ipratropium-albuterol  (DUONEB) 0.5-2.5 (3) MG/3ML SOLN Take 3 mLs by nebulization every 6 (six) hours as needed (as needed).     ketoconazole (NIZORAL) 2 % cream Apply 1 Application topically 2 (two) times daily.     levocetirizine (XYZAL ) 5 MG tablet TAKE 1 TABLET(5 MG) BY MOUTH EVERY EVENING 90 tablet 3   lubiprostone  (AMITIZA ) 24 MCG capsule TAKE 1 CAPSULE(24 MCG) BY MOUTH TWICE DAILY WITH A MEAL 60 capsule 5   MAGNESIUM PO Take 500 mg by mouth daily.     meloxicam (MOBIC) 15 MG tablet Take 15 mg by mouth daily.     montelukast  (SINGULAIR ) 10 MG tablet Take 1 tablet (10 mg total) by mouth at bedtime. 90 tablet 0   mupirocin  ointment (BACTROBAN ) 2 %  Apply 1 Application topically 2 (two) times daily. 22 g 0   nicotine  (NICODERM CQ  - DOSED IN MG/24 HOURS) 21 mg/24hr patch Place 1 patch (21 mg total) onto the skin daily. 28 patch 1   nitroGLYCERIN  (NITROSTAT ) 0.4 MG SL tablet Place 1 tablet (0.4 mg total) under the tongue every 5 (five) minutes as needed for chest pain. 50 tablet 3   nystatin  (MYCOSTATIN /NYSTOP ) powder Apply 1 Application topically 3 (three) times daily. 15 g 0   ondansetron  (ZOFRAN -ODT) 4 MG disintegrating tablet Take 1 tablet (4 mg total) by mouth every 8 (eight) hours as needed for nausea or vomiting. DISSOLVE 1 TABLET(4 MG) ON THE TONGUE EVERY 8 HOURS AS NEEDED FOR NAUSEA OR VOMITING 20 tablet 0   pantoprazole  (PROTONIX ) 40 MG tablet TAKE 1 TABLET(40 MG) BY MOUTH TWICE DAILY 180 tablet 3   semaglutide -weight management (WEGOVY ) 2.4 MG/0.75ML SOAJ SQ injection Inject 2.4 mg into the skin once a week. 3 mL 2   triamcinolone  (KENALOG ) 0.025 % ointment Apply 1 Application topically 2 (two) times daily. Apply to corners of mouth twice daily 30 g 0   TURMERIC PO Take 1,000 mg by mouth daily. With ginger     VITAMIN E PO Take by mouth.     No current  facility-administered medications on file prior to visit.   "

## 2024-10-20 NOTE — Assessment & Plan Note (Signed)
 Currently smoking half a pack per day. Using nicotine  patches. Attempting to quit smoking. - Continue nicotine  patches 21 mg daily. - Encouraged smoking cessation efforts. Orders:   buPROPion  (WELLBUTRIN  XL) 300 MG 24 hr tablet; Take 1 tablet (300 mg total) by mouth daily.

## 2024-10-20 NOTE — Assessment & Plan Note (Signed)
 Persistent esophageal dysphagia despite pantoprazole . Recent esophageal dilation provided temporary relief. - Increased pantoprazole  to twice daily. - Added famotidine  at night if symptoms persist.

## 2024-10-20 NOTE — Assessment & Plan Note (Signed)
 Managed with hydroxychloroquine . Recent ultrasound shows improvement. - Continue hydroxychloroquine  200 mg twice daily Monday to Friday.

## 2024-10-20 NOTE — Assessment & Plan Note (Signed)
 Occasional cough, especially in the morning. - Continue Advair and albuterol  inhaler as needed.

## 2024-10-20 NOTE — Assessment & Plan Note (Signed)
 Chronic dermatitis under the breasts with ongoing moisture and irritation. - Consider using over-the-counter deodorant to keep area dry.

## 2024-10-20 NOTE — Assessment & Plan Note (Signed)
 Managed with lubiprostone  and docusate sodium . Occasional use of dicyclomine  for bowel spasms. - Continue lubiprostone  and docusate sodium  as needed. - Use dicyclomine  for bowel spasms as needed.

## 2024-10-20 NOTE — Assessment & Plan Note (Signed)
 Blood pressure slightly elevated at 136 mmHg systolic. - Continue amlodipine  10 mg daily.

## 2024-10-21 ENCOUNTER — Ambulatory Visit: Payer: Self-pay | Admitting: Family Medicine

## 2024-10-24 MED ORDER — ONDANSETRON 4 MG PO TBDP: 4.0000 mg | ORAL_TABLET | Freq: Three times a day (TID) | ORAL | 0 refills | Status: AC | PRN

## 2024-10-24 NOTE — Telephone Encounter (Signed)
 Patient called stating she is experiencing same symptoms. States she is very nauseous. She is scheduled for 2/10. Requesting to know if any medication can be sent in. Please advise, thank you.

## 2024-10-24 NOTE — Addendum Note (Signed)
 Addended by: KOLEEN PERKINS F on: 10/24/2024 11:35 AM   Modules accepted: Orders

## 2024-10-24 NOTE — Telephone Encounter (Signed)
 Spoke with patient who has been having on-gong nausea.  Patient has appointment with Dr Charlanne next week. Patient requests nausea meds. Re-filled prescription for Zofran  ODT. Patient confirmed pharmacy.

## 2024-10-26 ENCOUNTER — Other Ambulatory Visit: Payer: Self-pay | Admitting: Family Medicine

## 2024-10-26 DIAGNOSIS — F17219 Nicotine dependence, cigarettes, with unspecified nicotine-induced disorders: Secondary | ICD-10-CM

## 2024-10-26 DIAGNOSIS — M545 Low back pain, unspecified: Secondary | ICD-10-CM

## 2024-10-30 ENCOUNTER — Ambulatory Visit: Admitting: Gastroenterology

## 2024-11-01 ENCOUNTER — Ambulatory Visit: Payer: Self-pay | Admitting: Physician Assistant

## 2024-11-01 DIAGNOSIS — M79641 Pain in right hand: Secondary | ICD-10-CM

## 2024-11-01 DIAGNOSIS — Z8709 Personal history of other diseases of the respiratory system: Secondary | ICD-10-CM

## 2024-11-01 DIAGNOSIS — I700 Atherosclerosis of aorta: Secondary | ICD-10-CM

## 2024-11-01 DIAGNOSIS — Z720 Tobacco use: Secondary | ICD-10-CM

## 2024-11-01 DIAGNOSIS — M51369 Other intervertebral disc degeneration, lumbar region without mention of lumbar back pain or lower extremity pain: Secondary | ICD-10-CM

## 2024-11-01 DIAGNOSIS — Z79899 Other long term (current) drug therapy: Secondary | ICD-10-CM

## 2024-11-01 DIAGNOSIS — I10 Essential (primary) hypertension: Secondary | ICD-10-CM

## 2024-11-01 DIAGNOSIS — G43809 Other migraine, not intractable, without status migrainosus: Secondary | ICD-10-CM

## 2024-11-01 DIAGNOSIS — M4126 Other idiopathic scoliosis, lumbar region: Secondary | ICD-10-CM

## 2024-11-01 DIAGNOSIS — Z840 Family history of diseases of the skin and subcutaneous tissue: Secondary | ICD-10-CM

## 2024-11-01 DIAGNOSIS — M19071 Primary osteoarthritis, right ankle and foot: Secondary | ICD-10-CM

## 2024-11-01 DIAGNOSIS — E782 Mixed hyperlipidemia: Secondary | ICD-10-CM

## 2024-11-01 DIAGNOSIS — R7303 Prediabetes: Secondary | ICD-10-CM

## 2024-11-01 DIAGNOSIS — M7120 Synovial cyst of popliteal space [Baker], unspecified knee: Secondary | ICD-10-CM

## 2024-11-01 DIAGNOSIS — M0600 Rheumatoid arthritis without rheumatoid factor, unspecified site: Secondary | ICD-10-CM

## 2024-11-01 DIAGNOSIS — Q6672 Congenital pes cavus, left foot: Secondary | ICD-10-CM

## 2024-11-01 DIAGNOSIS — J301 Allergic rhinitis due to pollen: Secondary | ICD-10-CM

## 2024-11-01 DIAGNOSIS — Z96651 Presence of right artificial knee joint: Secondary | ICD-10-CM

## 2024-11-01 DIAGNOSIS — M7711 Lateral epicondylitis, right elbow: Secondary | ICD-10-CM

## 2024-11-01 DIAGNOSIS — K219 Gastro-esophageal reflux disease without esophagitis: Secondary | ICD-10-CM

## 2024-11-01 DIAGNOSIS — F411 Generalized anxiety disorder: Secondary | ICD-10-CM

## 2024-11-01 DIAGNOSIS — M7061 Trochanteric bursitis, right hip: Secondary | ICD-10-CM

## 2024-11-01 DIAGNOSIS — M19042 Primary osteoarthritis, left hand: Secondary | ICD-10-CM

## 2024-11-01 DIAGNOSIS — Z8261 Family history of arthritis: Secondary | ICD-10-CM

## 2024-11-13 ENCOUNTER — Encounter (HOSPITAL_BASED_OUTPATIENT_CLINIC_OR_DEPARTMENT_OTHER)

## 2024-11-13 ENCOUNTER — Ambulatory Visit: Admitting: Pulmonary Disease

## 2024-11-15 ENCOUNTER — Encounter (HOSPITAL_BASED_OUTPATIENT_CLINIC_OR_DEPARTMENT_OTHER)

## 2024-11-15 ENCOUNTER — Ambulatory Visit: Admitting: Pulmonary Disease

## 2025-01-24 ENCOUNTER — Ambulatory Visit: Admitting: Family Medicine
